# Patient Record
Sex: Female | Born: 1965
Health system: Southern US, Community
[De-identification: ages and names within clinical notes are randomized; demographics above are authoritative.]

## PROBLEM LIST (undated history)

## (undated) DIAGNOSIS — Z803 Family history of malignant neoplasm of breast: Secondary | ICD-10-CM

## (undated) DIAGNOSIS — J45909 Unspecified asthma, uncomplicated: Secondary | ICD-10-CM

## (undated) DIAGNOSIS — C50919 Malignant neoplasm of unspecified site of unspecified female breast: Secondary | ICD-10-CM

## (undated) DIAGNOSIS — I10 Essential (primary) hypertension: Secondary | ICD-10-CM

## (undated) DIAGNOSIS — C801 Malignant (primary) neoplasm, unspecified: Secondary | ICD-10-CM

## (undated) DIAGNOSIS — E876 Hypokalemia: Secondary | ICD-10-CM

## (undated) DIAGNOSIS — Z801 Family history of malignant neoplasm of trachea, bronchus and lung: Secondary | ICD-10-CM

## (undated) DIAGNOSIS — F32A Depression, unspecified: Secondary | ICD-10-CM

## (undated) DIAGNOSIS — J302 Other seasonal allergic rhinitis: Secondary | ICD-10-CM

## (undated) DIAGNOSIS — G5600 Carpal tunnel syndrome, unspecified upper limb: Secondary | ICD-10-CM

## (undated) HISTORY — DX: Carpal tunnel syndrome, unspecified upper limb: G56.00

## (undated) HISTORY — PX: TUBAL LIGATION: SHX77

## (undated) HISTORY — PX: DILATION AND CURETTAGE OF UTERUS: SHX78

## (undated) HISTORY — PX: ABDOMINAL HYSTERECTOMY: SHX81

## (undated) HISTORY — DX: Family history of malignant neoplasm of breast: Z80.3

## (undated) HISTORY — PX: OTHER SURGICAL HISTORY: SHX169

## (undated) HISTORY — DX: Family history of malignant neoplasm of trachea, bronchus and lung: Z80.1

---

## 2001-04-06 ENCOUNTER — Ambulatory Visit (HOSPITAL_COMMUNITY): Admission: RE | Admit: 2001-04-06 | Discharge: 2001-04-06 | Payer: Self-pay | Admitting: Family Medicine

## 2001-04-06 ENCOUNTER — Encounter: Payer: Self-pay | Admitting: Family Medicine

## 2009-11-13 ENCOUNTER — Ambulatory Visit (HOSPITAL_COMMUNITY): Admission: RE | Admit: 2009-11-13 | Discharge: 2009-11-13 | Payer: Self-pay | Admitting: Family Medicine

## 2010-11-08 ENCOUNTER — Ambulatory Visit (HOSPITAL_COMMUNITY): Admission: RE | Admit: 2010-11-08 | Discharge: 2010-11-08 | Payer: Self-pay | Admitting: Family Medicine

## 2010-11-28 ENCOUNTER — Ambulatory Visit (HOSPITAL_COMMUNITY): Admission: RE | Admit: 2010-11-28 | Discharge: 2010-11-28 | Payer: Self-pay | Admitting: Family Medicine

## 2010-12-30 HISTORY — PX: DILATION AND CURETTAGE OF UTERUS: SHX78

## 2011-01-16 LAB — BASIC METABOLIC PANEL
BUN: 7 mg/dL (ref 6–23)
CO2: 28 mEq/L (ref 19–32)
Calcium: 10 mg/dL (ref 8.4–10.5)
Chloride: 102 mEq/L (ref 96–112)
Creatinine, Ser: 1.12 mg/dL (ref 0.4–1.2)
GFR calc Af Amer: >60 mL/min (ref >60)
GFR calc non Af Amer: 53 mL/min — ABNORMAL LOW (ref >60)
Glucose, Bld: 110 mg/dL — ABNORMAL HIGH (ref 70–99)
Potassium: 2.8 mEq/L — ABNORMAL LOW (ref 3.5–5.1)
Sodium: 138 mEq/L (ref 135–145)

## 2011-01-16 LAB — CBC
HCT: 43.8 % (ref 36.0–46.0)
Hemoglobin: 15.2 g/dL — ABNORMAL HIGH (ref 12.0–15.0)
MCH: 28.5 pg (ref 26.0–34.0)
MCHC: 34.7 g/dL (ref 30.0–36.0)
MCV: 82 fL (ref 78.0–100.0)
Platelets: 408 10*3/uL — ABNORMAL HIGH (ref 150–400)
RBC: 5.34 MIL/uL — ABNORMAL HIGH (ref 3.87–5.11)
RDW: 13.8 % (ref 11.5–15.5)
WBC: 12 10*3/uL — ABNORMAL HIGH (ref 4.0–10.5)

## 2011-01-16 LAB — APTT: aPTT: 29 seconds (ref 24–37)

## 2011-01-16 LAB — PROTIME-INR
INR: 1.02 (ref 0.00–1.49)
Prothrombin Time: 13.6 seconds (ref 11.6–15.2)

## 2011-01-17 ENCOUNTER — Ambulatory Visit (HOSPITAL_COMMUNITY)
Admission: RE | Admit: 2011-01-17 | Discharge: 2011-01-17 | Payer: Self-pay | Source: Home / Self Care | Attending: Obstetrics and Gynecology | Admitting: Obstetrics and Gynecology

## 2011-01-19 ENCOUNTER — Encounter: Payer: Self-pay | Admitting: Family Medicine

## 2011-01-21 LAB — POTASSIUM: Potassium: 3.8 meq/L (ref 3.5–5.1)

## 2011-01-21 LAB — PREGNANCY, URINE: Preg Test, Ur: NEGATIVE

## 2011-02-01 NOTE — Op Note (Signed)
Elizabeth Graves, Elizabeth Graves                ACCOUNT NO.:  [id number]  MEDICAL RECORD NO.:  [id number]          PATIENT TYPE:  AMB  LOCATION:  [facility]                           FACILITY:  [facility]  PHYSICIAN:  Miguel Aschoff, M.D.       DATE OF BIRTH:  [birth year]  DATE OF PROCEDURE:  01/17/2011 DATE OF DISCHARGE:                              OPERATIVE REPORT   PREOPERATIVE DIAGNOSES: 1. Irregular vaginal bleeding. 2. Abnormal ultrasound of uterine cavity, revealing thickened     heterogeneous echogenic endometrium, suggestive of hyperplasia or     polyp.  POSTOPERATIVE DIAGNOSES: 1. Irregular vaginal bleeding. 2. Abnormal ultrasound of uterine cavity, revealing thickened     heterogeneous echogenic endometrium, suggestive of hyperplasia or     polyp.  PROCEDURES: 1. Cervical dilatation. 2. Hysteroscopy. 3. Uterine curettage.  SURGEON:  Miguel Aschoff, MD  ANESTHESIA:  General.  COMPLICATIONS:  None.  JUSTIFICATION:  The patient is a 45 year old white female with history of irregular bleeding who had been placed on Megace by another physician, who has been taking high dose of Megace for several months. The patient ended up having an ultrasound done which revealed abnormal findings of the endometrial cavity with the cavity described as thickened heterogeneous and echogenic endometrium, measuring 2 cm in thickness.  I felt this was due to endometrial hyperplasia or large polyp, also the possibility of endometrial carcinoma.  Because of this ultrasound finding, she is being taken to the operating room at this time to undergo hysteroscopy and D and C to ensure that a neoplastic process does not exist.  Risks and benefits of procedure were discussed with the patient.  DESCRIPTION OF PROCEDURE:  The patient was taken to the operating room, placed in supine position.  General anesthesia was administered without difficulty.  She was then placed in the dorsal lithotomy position, prepped and draped in  the usual sterile fashion.  The bladder was catheterized.  Once this was done, examination under anesthesia was carried out.  This revealed normal external genitalia, normal Bartholin and Skene glands, normal urethra.  The vaginal vault was without gross lesion.  The cervix was without gross lesion.  Uterus was noted to be anterior, normal size and shape.  No adnexal masses were noted. Speculum was then placed in the vaginal vault.  Anterior cervical lip was grasped with a tenaculum and then the endocervical canal was sounded with uterine length of 9.5 cm.  Then using serial Pratt dilators, the cervix was further dilated until a #25 Pratt dilator could be passed. The cavity sounded 9.5 cm.  Once this was done, it was further dilated using serial Pratt dilators until a #25 Pratt dilator could be passed. Once this was done, the diagnostic hysteroscope was advanced through the endocervical canal.  No endocervical lesions were noted.  The endometrial cavity was then visualized.  There were no definite polyps noted but a moderate amount of tissue was noted that appeared to be peeling off the endometrial surface and hanging into the endometrial cavity.  The exact nature of these endometrial fragments was unclear. However, no polyps or submucous myomas were noted.  At this point, the hysteroscope was removed and vigorous sharp curettage was carried out with this tissue being removed and sent for histologic study.  On completion of the curettage, hemostasis was readily achieved.  The cervix was then injected with a total of 10 mL of 1% Xylocaine for postop analgesia.  The estimated blood loss from the procedure was less than 20 mL.  The patient tolerated the procedure well.  Plan is for the patient to be discharged home.  Medications for home include Cataflam 50 mg t.i.d. p.r.n. cramping.  She is to call for any problems such as fever, pain, or heavy bleeding.  She is instructed to call for  pathology report in 1 week.  She will be seen back in the office in 3-4 weeks for postop examination.  The patient was sent home in satisfactory condition.     Miguel Aschoff, M.D.     AR/MEDQ  D:  01/17/2011  T:  01/18/2011  Job:  [id number]  Electronically Signed by Miguel Aschoff M.D. on 02/01/2011 06:44:37 AM

## 2012-03-11 ENCOUNTER — Other Ambulatory Visit: Payer: Self-pay | Admitting: Obstetrics and Gynecology

## 2012-05-26 ENCOUNTER — Encounter (HOSPITAL_COMMUNITY): Payer: Self-pay

## 2012-06-02 ENCOUNTER — Other Ambulatory Visit: Payer: Self-pay | Admitting: Obstetrics and Gynecology

## 2012-06-03 ENCOUNTER — Encounter (HOSPITAL_COMMUNITY): Payer: Self-pay

## 2012-06-03 ENCOUNTER — Encounter (HOSPITAL_COMMUNITY)
Admission: RE | Admit: 2012-06-03 | Discharge: 2012-06-03 | Disposition: A | Discharge status: Home / Self Care | Payer: Self-pay | Source: Ambulatory Visit | Attending: Obstetrics and Gynecology | Admitting: Obstetrics and Gynecology

## 2012-06-03 HISTORY — DX: Other seasonal allergic rhinitis: J30.2

## 2012-06-03 HISTORY — DX: Essential (primary) hypertension: I10

## 2012-06-03 HISTORY — DX: Malignant (primary) neoplasm, unspecified: C80.1

## 2012-06-03 HISTORY — DX: Hypokalemia: E87.6

## 2012-06-03 LAB — BASIC METABOLIC PANEL
BUN: 8 mg/dL (ref 6–23)
Chloride: 102 mEq/L (ref 96–112)
Creatinine, Ser: 1.01 mg/dL (ref 0.50–1.10)
GFR calc Af Amer: 78 mL/min — ABNORMAL LOW (ref >90)
Glucose, Bld: 107 mg/dL — ABNORMAL HIGH (ref 70–99)

## 2012-06-03 LAB — CBC
HCT: 42.5 % (ref 36.0–46.0)
Hemoglobin: 14.7 g/dL (ref 12.0–15.0)
MCH: 29.1 pg (ref 26.0–34.0)
MCHC: 34.6 g/dL (ref 30.0–36.0)
RDW: 14.6 % (ref 11.5–15.5)

## 2012-06-03 LAB — SURGICAL PCR SCREEN: Staphylococcus aureus: NEGATIVE

## 2012-06-03 NOTE — Patient Instructions (Addendum)
   Your procedure is scheduled on: Tuesday June 11th  Enter through the [employer] [facility] at: [employer] up the phone at the desk and dial [phone] and inform us of your arrival.  Please call this number if you have any problems the morning of surgery: [phone]  Remember: Do not eat food after midnight: Monday Do not drink clear liquids after: Midnight Monday Take these medicines the morning of surgery with a SIP OF WATER: hydrochlorothiazide   Do not wear jewelry, make-up, or FINGER nail polish Do not wear lotions, powders, perfumes or deodorant. Do not shave 48 hours prior to surgery. Do not bring valuables to the hospital. Contacts, dentures or bridgework may not be worn into surgery.  Leave suitcase in the car. After Surgery it may be brought to your room. For patients being admitted to the hospital, checkout time is 11:00am the day of discharge. Home with mother Deanne Coffer.  Patients discharged on the day of surgery will not be allowed to drive home.     Remember to use your hibiclens as instructed.Please shower with 1/2 bottle the evening before your surgery and the other 1/2 bottle the morning of surgery. Neck down avoiding private area.

## 2012-06-08 NOTE — H&P (Signed)
Elizabeth Graves is an 46 y.o. female. G6 [id number] with a history of heavy irregular vaginal bleeding and pelvic pain. She was treated with Provera in an effort to regulate her cycles but this failed to correct the heavy and irregular bleeding. She has now reached the point at which she wants definitive treatment and is being admitted for a hysterectomy and bilateral salpingo oophorectomy. She understands that if after an exam under anesthesia reveals that this can be done laparoscopically that would be the primary route of choice but also that an abdominal hysterectomy may need to be performed.  Pertinent Gynecological History: Menses: irregular occurring approximately every 15 days with spotting approximately 10 days per month Bleeding: dysfunctional uterine bleeding Contraception: tubal ligation DES exposure: denies Blood transfusions: none Sexually transmitted diseases: no past history Previous GYN Procedures: DNC  Last pap: normal Date:03/11/2012 OB History: G6, P3825   Past Medical History  Diagnosis Date  . SVD (spontaneous vaginal delivery)     x 2  . Cancer     cervical - 20 yrs ago  . Hypertension   . Seasonal allergies     tx with benadryl prn  . Hypokalemia     tx w/ k-Dur    Past Surgical History  Procedure Date  . Tubal ligation   . Dilation and curettage of uterus 12/2010    polyp removed  . Dilation and curettage of uterus     x 3 for MAB  . Co2 laser of cervix     for cervical cancer 20 yrs ago    No family history on file.  Social History:  reports that she has been smoking Cigarettes.  She has a 16 pack-year smoking history. She has never used smokeless tobacco. She reports that she does not drink alcohol or use illicit drugs.  Allergies: No Known Allergies  No prescriptions prior to admission    ROS Respiratory: no cough or shortness of breath GI: no nausea, vomiting, constipation, or diarrhea GU: no dysuria, frequency, or urgency Gyn: see HPI, there is  no discharge or pruritis  There were no vitals taken for this visit. Physical Exam  Afebrile  BP 122/78   Pulse 84  Rerspirations 16 Head: Normocephalic and atraumatic Neck: Supple no JVD no adenopathy no enlargement of the thyroid Breasts: no tender and without masses Heart: regular rhythm no murmur or gallop Abdomen: Soft without enlargement of the liver kidneys or spleen Pelvic Exam:                        External genitalia: within normal limits                      BUS: normal                      Vagina: without lesion or significant discharge                       Cervix: without lesion                      Uterus: globular 10 week equivalent size non tender                       Adnexa without masses  Skin: without lesions Neurologic exam: Oriented times three. Grossly intact No results found for this or any previous visit (from the past  24 hour(s)).  No results found.  Assessment/Plan: Menometrorrhagia not responding to medical therapy  Plan: LAVH BSO possible abdominal hysterectomy BSO  Risks of infection, transfusion, and injury to adjacent structures was discuss and informed consent has been obtained.  Elizabeth Graves 06/08/2012, 5:09 PM

## 2012-06-09 ENCOUNTER — Encounter (HOSPITAL_COMMUNITY): Payer: Self-pay

## 2012-06-09 ENCOUNTER — Encounter (HOSPITAL_COMMUNITY)
Admission: RE | Disposition: A | Discharge status: Home / Self Care | Payer: Self-pay | Source: Ambulatory Visit | Attending: Obstetrics and Gynecology

## 2012-06-09 ENCOUNTER — Ambulatory Visit (HOSPITAL_COMMUNITY): Payer: Self-pay

## 2012-06-09 ENCOUNTER — Ambulatory Visit (HOSPITAL_COMMUNITY)
Admission: RE | Admit: 2012-06-09 | Discharge: 2012-06-10 | Disposition: A | Discharge status: Home / Self Care | Payer: Self-pay | Source: Ambulatory Visit | Attending: Obstetrics and Gynecology | Admitting: Obstetrics and Gynecology

## 2012-06-09 DIAGNOSIS — N920 Excessive and frequent menstruation with regular cycle: Secondary | ICD-10-CM | POA: Insufficient documentation

## 2012-06-09 DIAGNOSIS — N8300 Follicular cyst of ovary, unspecified side: Secondary | ICD-10-CM | POA: Insufficient documentation

## 2012-06-09 DIAGNOSIS — D251 Intramural leiomyoma of uterus: Secondary | ICD-10-CM | POA: Insufficient documentation

## 2012-06-09 DIAGNOSIS — Z01812 Encounter for preprocedural laboratory examination: Secondary | ICD-10-CM | POA: Insufficient documentation

## 2012-06-09 DIAGNOSIS — Z01818 Encounter for other preprocedural examination: Secondary | ICD-10-CM | POA: Insufficient documentation

## 2012-06-09 DIAGNOSIS — N949 Unspecified condition associated with female genital organs and menstrual cycle: Secondary | ICD-10-CM | POA: Insufficient documentation

## 2012-06-09 HISTORY — PX: SALPINGOOPHORECTOMY: SHX82

## 2012-06-09 HISTORY — PX: LAPAROSCOPIC ASSISTED VAGINAL HYSTERECTOMY: SHX5398

## 2012-06-09 LAB — HEMOGLOBIN AND HEMATOCRIT, BLOOD
HCT: 39.3 % (ref 36.0–46.0)
Hemoglobin: 13.4 g/dL (ref 12.0–15.0)

## 2012-06-09 LAB — APTT: aPTT: 28 seconds (ref 24–37)

## 2012-06-09 SURGERY — HYSTERECTOMY, VAGINAL, LAPAROSCOPY-ASSISTED
Anesthesia: General | Site: Abdomen | Wound class: Clean Contaminated

## 2012-06-09 MED ORDER — BUPIVACAINE HCL (PF) 0.25 % IJ SOLN
INTRAMUSCULAR | Status: AC
  Filled 2012-06-09: qty 30

## 2012-06-09 MED ORDER — DIPHENHYDRAMINE HCL 12.5 MG/5ML PO ELIX: 12.5000 mg | ORAL_SOLUTION | Freq: Four times a day (QID) | ORAL | Status: DC | PRN

## 2012-06-09 MED ORDER — LACTATED RINGERS IV SOLN
INTRAVENOUS | Status: DC
  Administered 2012-06-09 (×2): via INTRAVENOUS
  Administered 2012-06-09: 50 mL/h via INTRAVENOUS

## 2012-06-09 MED ORDER — ROCURONIUM BROMIDE 100 MG/10ML IV SOLN
INTRAVENOUS | Status: DC | PRN
  Administered 2012-06-09: 10 mg via INTRAVENOUS
  Administered 2012-06-09: 40 mg via INTRAVENOUS

## 2012-06-09 MED ORDER — PROPOFOL 10 MG/ML IV EMUL
INTRAVENOUS | Status: AC
  Filled 2012-06-09: qty 20

## 2012-06-09 MED ORDER — ONDANSETRON HCL 4 MG/2ML IJ SOLN: 4.0000 mg | Freq: Four times a day (QID) | INTRAMUSCULAR | Status: DC | PRN

## 2012-06-09 MED ORDER — FENTANYL CITRATE 0.05 MG/ML IJ SOLN
INTRAMUSCULAR | Status: AC
  Filled 2012-06-09: qty 5

## 2012-06-09 MED ORDER — FENTANYL CITRATE 0.05 MG/ML IJ SOLN
INTRAMUSCULAR | Status: DC | PRN
  Administered 2012-06-09 (×2): 100 ug via INTRAVENOUS
  Administered 2012-06-09 (×3): 50 ug via INTRAVENOUS

## 2012-06-09 MED ORDER — FENTANYL CITRATE 0.05 MG/ML IJ SOLN
INTRAMUSCULAR | Status: AC
  Filled 2012-06-09: qty 2

## 2012-06-09 MED ORDER — OXYCODONE-ACETAMINOPHEN 5-325 MG PO TABS
1.0000 | ORAL_TABLET | ORAL | Status: DC | PRN
  Administered 2012-06-10: 1 via ORAL
  Filled 2012-06-09: qty 1

## 2012-06-09 MED ORDER — NALOXONE HCL 0.4 MG/ML IJ SOLN: 0.4000 mg | INTRAMUSCULAR | Status: DC | PRN

## 2012-06-09 MED ORDER — LIDOCAINE-EPINEPHRINE 1 %-1:100000 IJ SOLN
INTRAMUSCULAR | Status: DC | PRN
  Administered 2012-06-09: 30 mL

## 2012-06-09 MED ORDER — SODIUM CHLORIDE 0.9 % IJ SOLN: 9.0000 mL | INTRAMUSCULAR | Status: DC | PRN

## 2012-06-09 MED ORDER — DIPHENHYDRAMINE HCL 50 MG/ML IJ SOLN: 12.5000 mg | Freq: Four times a day (QID) | INTRAMUSCULAR | Status: DC | PRN

## 2012-06-09 MED ORDER — BUPIVACAINE HCL (PF) 0.25 % IJ SOLN
INTRAMUSCULAR | Status: DC | PRN
  Administered 2012-06-09: 30 mL

## 2012-06-09 MED ORDER — PROPOFOL 10 MG/ML IV EMUL
INTRAVENOUS | Status: DC | PRN
  Administered 2012-06-09: 100 mg via INTRAVENOUS
  Administered 2012-06-09: 200 mg via INTRAVENOUS

## 2012-06-09 MED ORDER — HYDROMORPHONE HCL PF 1 MG/ML IJ SOLN
INTRAMUSCULAR | Status: AC
  Administered 2012-06-09: 0.5 mg via INTRAVENOUS
  Filled 2012-06-09: qty 1

## 2012-06-09 MED ORDER — HYDROMORPHONE HCL PF 1 MG/ML IJ SOLN
0.2500 mg | INTRAMUSCULAR | Status: DC | PRN
  Administered 2012-06-09 (×4): 0.5 mg via INTRAVENOUS

## 2012-06-09 MED ORDER — LACTATED RINGERS IR SOLN
Status: DC | PRN
  Administered 2012-06-09: 3000 mL

## 2012-06-09 MED ORDER — CEFAZOLIN SODIUM 1-5 GM-% IV SOLN
INTRAVENOUS | Status: AC
  Filled 2012-06-09: qty 50

## 2012-06-09 MED ORDER — GLYCOPYRROLATE 0.2 MG/ML IJ SOLN
INTRAMUSCULAR | Status: AC
  Filled 2012-06-09: qty 1

## 2012-06-09 MED ORDER — MENTHOL 3 MG MT LOZG: 1.0000 | LOZENGE | OROMUCOSAL | Status: DC | PRN

## 2012-06-09 MED ORDER — LACTATED RINGERS IV SOLN
INTRAVENOUS | Status: DC
  Administered 2012-06-09 – 2012-06-10 (×3): via INTRAVENOUS

## 2012-06-09 MED ORDER — NEOSTIGMINE METHYLSULFATE 1 MG/ML IJ SOLN
INTRAMUSCULAR | Status: AC
  Filled 2012-06-09: qty 10

## 2012-06-09 MED ORDER — CEFAZOLIN SODIUM 1-5 GM-% IV SOLN
1.0000 g | INTRAVENOUS | Status: AC
  Administered 2012-06-09: 1 g via INTRAVENOUS

## 2012-06-09 MED ORDER — ONDANSETRON HCL 4 MG/2ML IJ SOLN
INTRAMUSCULAR | Status: DC | PRN
  Administered 2012-06-09: 4 mg via INTRAVENOUS

## 2012-06-09 MED ORDER — NEOSTIGMINE METHYLSULFATE 1 MG/ML IJ SOLN
INTRAMUSCULAR | Status: DC | PRN
  Administered 2012-06-09: 5 mg via INTRAVENOUS

## 2012-06-09 MED ORDER — MIDAZOLAM HCL 5 MG/5ML IJ SOLN
INTRAMUSCULAR | Status: DC | PRN
  Administered 2012-06-09: 2 mg via INTRAVENOUS

## 2012-06-09 MED ORDER — GLYCOPYRROLATE 0.2 MG/ML IJ SOLN
INTRAMUSCULAR | Status: AC
  Filled 2012-06-09: qty 3

## 2012-06-09 MED ORDER — DEXAMETHASONE SODIUM PHOSPHATE 4 MG/ML IJ SOLN
INTRAMUSCULAR | Status: DC | PRN
  Administered 2012-06-09: 10 mg via INTRAVENOUS

## 2012-06-09 MED ORDER — IBUPROFEN 600 MG PO TABS
600.0000 mg | ORAL_TABLET | Freq: Four times a day (QID) | ORAL | Status: DC | PRN
  Administered 2012-06-10: 600 mg via ORAL
  Filled 2012-06-09: qty 1

## 2012-06-09 MED ORDER — GLYCOPYRROLATE 0.2 MG/ML IJ SOLN
INTRAMUSCULAR | Status: DC | PRN
  Administered 2012-06-09: 0.4 mg via INTRAVENOUS
  Administered 2012-06-09: 1 mg via INTRAVENOUS

## 2012-06-09 MED ORDER — DEXAMETHASONE SODIUM PHOSPHATE 10 MG/ML IJ SOLN
INTRAMUSCULAR | Status: AC
  Filled 2012-06-09: qty 1

## 2012-06-09 MED ORDER — LIDOCAINE HCL (CARDIAC) 20 MG/ML IV SOLN
INTRAVENOUS | Status: DC | PRN
  Administered 2012-06-09: 30 mg via INTRAVENOUS

## 2012-06-09 MED ORDER — MIDAZOLAM HCL 2 MG/2ML IJ SOLN
INTRAMUSCULAR | Status: AC
  Filled 2012-06-09: qty 2

## 2012-06-09 MED ORDER — LIDOCAINE HCL (CARDIAC) 20 MG/ML IV SOLN
INTRAVENOUS | Status: AC
  Filled 2012-06-09: qty 5

## 2012-06-09 MED ORDER — MORPHINE SULFATE (PF) 1 MG/ML IV SOLN
INTRAVENOUS | Status: DC
  Administered 2012-06-09: 3 mg via INTRAVENOUS
  Administered 2012-06-09: 9 mg via INTRAVENOUS
  Administered 2012-06-09: 6 mg via INTRAVENOUS
  Administered 2012-06-09: 11:00:00 via INTRAVENOUS
  Administered 2012-06-10: 4.5 mg via INTRAVENOUS
  Administered 2012-06-10: via INTRAVENOUS
  Administered 2012-06-10: 7.5 mg via INTRAVENOUS
  Filled 2012-06-09 (×2): qty 25

## 2012-06-09 MED ORDER — ONDANSETRON HCL 4 MG/2ML IJ SOLN
INTRAMUSCULAR | Status: AC
  Filled 2012-06-09: qty 2

## 2012-06-09 SURGICAL SUPPLY — 42 items
BLADE SURG 15 STRL LF C SS BP (BLADE) ×2 IMPLANT
BLADE SURG 15 STRL SS (BLADE) ×1
CLOTH BEACON ORANGE TIMEOUT ST (SAFETY) ×3 IMPLANT
CONT PATH 16OZ SNAP LID 3702 (MISCELLANEOUS) ×3 IMPLANT
COVER TABLE BACK 60X90 (DRAPES) ×3 IMPLANT
DECANTER SPIKE VIAL GLASS SM (MISCELLANEOUS) IMPLANT
DERMABOND ADVANCED (GAUZE/BANDAGES/DRESSINGS) ×1
DERMABOND ADVANCED .7 DNX12 (GAUZE/BANDAGES/DRESSINGS) ×2 IMPLANT
DRSG COVADERM PLUS 2X2 (GAUZE/BANDAGES/DRESSINGS) ×3 IMPLANT
ELECT REM PT RETURN 9FT ADLT (ELECTROSURGICAL) ×3
ELECTRODE REM PT RTRN 9FT ADLT (ELECTROSURGICAL) ×2 IMPLANT
FORCEPS CUTTING 33CM 5MM (CUTTING FORCEPS) IMPLANT
GAUZE PACKING IODOFORM 2 (PACKING) IMPLANT
GLOVE BIO SURGEON STRL SZ7.5 (GLOVE) ×9 IMPLANT
GLOVE BIOGEL PI IND STRL 6.5 (GLOVE) ×2 IMPLANT
GLOVE BIOGEL PI INDICATOR 6.5 (GLOVE) ×1
GLOVE ECLIPSE 7.5 STRL STRAW (GLOVE) ×6 IMPLANT
GLOVE INDICATOR 7.0 STRL GRN (GLOVE) ×3 IMPLANT
GLOVE SKINSENSE NS SZ7.0 (GLOVE) ×3
GLOVE SKINSENSE STRL SZ7.0 (GLOVE) ×6 IMPLANT
GOWN PREVENTION PLUS LG XLONG (DISPOSABLE) ×9 IMPLANT
GOWN PREVENTION PLUS XXLARGE (GOWN DISPOSABLE) ×3 IMPLANT
NS IRRIG 1000ML POUR BTL (IV SOLUTION) ×3 IMPLANT
PACK LAVH (CUSTOM PROCEDURE TRAY) ×3 IMPLANT
PROTECTOR NERVE ULNAR (MISCELLANEOUS) ×3 IMPLANT
SEALER TISSUE G2 CVD JAW 35 (ENDOMECHANICALS) ×2 IMPLANT
SEALER TISSUE G2 CVD JAW 45CM (ENDOMECHANICALS) ×1
SET IRRIG TUBING LAPAROSCOPIC (IRRIGATION / IRRIGATOR) ×3 IMPLANT
SOLUTION ELECTROLUBE (MISCELLANEOUS) ×3 IMPLANT
STRIP CLOSURE SKIN 1/2X4 (GAUZE/BANDAGES/DRESSINGS) ×3 IMPLANT
STRIP CLOSURE SKIN 1/4X3 (GAUZE/BANDAGES/DRESSINGS) ×3 IMPLANT
SUT CHROMIC 0 CT 1 (SUTURE) IMPLANT
SUT VIC AB 0 CT1 18XCR BRD8 (SUTURE) ×6 IMPLANT
SUT VIC AB 0 CT1 27 (SUTURE) ×2
SUT VIC AB 0 CT1 27XBRD ANBCTR (SUTURE) ×4 IMPLANT
SUT VIC AB 0 CT1 8-18 (SUTURE) ×3
SUT VIC AB 3-0 X1 27 (SUTURE) ×3 IMPLANT
SUT VICRYL 1 TIES 12X18 (SUTURE) ×3 IMPLANT
TOWEL OR 17X24 6PK STRL BLUE (TOWEL DISPOSABLE) ×6 IMPLANT
TRAY FOLEY CATH 14FR (SET/KITS/TRAYS/PACK) ×3 IMPLANT
WARMER LAPAROSCOPE (MISCELLANEOUS) ×3 IMPLANT
WATER STERILE IRR 1000ML POUR (IV SOLUTION) ×3 IMPLANT

## 2012-06-09 NOTE — Anesthesia Preprocedure Evaluation (Signed)
Anesthesia Evaluation    Airway       Dental  (+) Poor Dentition,    Pulmonary          Cardiovascular hypertension, Pt. on medications     Neuro/Psych    GI/Hepatic   Endo/Other  Morbid obesity  Renal/GU      Musculoskeletal   Abdominal   Peds  Hematology   Anesthesia Other Findings   Reproductive/Obstetrics                           Anesthesia Physical Anesthesia Plan  ASA: III  Anesthesia Plan: General   Post-op Pain Management:    Induction: Intravenous  Airway Management Planned: Oral ETT  Additional Equipment:   Intra-op Plan:   Post-operative Plan:   Informed Consent: I have reviewed the patients History and Physical, chart, labs and discussed the procedure including the risks, benefits and alternatives for the proposed anesthesia with the patient or authorized representative who has indicated his/her understanding and acceptance.   Dental Advisory Given and Dental advisory given  Plan Discussed with: CRNA and Surgeon  Anesthesia Plan Comments: (  Discussed  general anesthesia, including possible nausea, instrumentation of airway, sore throat,pulmonary aspiration, etc. I asked if the were any outstanding questions, or  concerns before we proceeded. )        Anesthesia Quick Evaluation

## 2012-06-09 NOTE — Brief Op Note (Signed)
06/09/2012  9:13 AM  PATIENT:  Elizabeth Graves  46 y.o. female  PRE-OPERATIVE DIAGNOSIS:  MENOMETRORRHAGIA  POST-OPERATIVE DIAGNOSIS:  MENOMETRORRHAGIA   PROCEDURE:  Procedure(s) (LRB): LAPAROSCOPIC ASSISTED VAGINAL HYSTERECTOMY (N/A) SALPINGO OOPHERECTOMY (Bilateral)  SURGEON:  Surgeon(s) and Role:    * Miguel Aschoff, MD - Primary    * W Lodema Hong, MD - Assisting   ANESTHESIA:   general  EBL:  Total I/O In: 2000 [I.V.:2000] Out: 175 [Urine:100; Blood:75]  BLOOD ADMINISTERED:none  DRAINS: Urinary Catheter (Foley)   LOCAL MEDICATIONS USED:  MARCAINE     SPECIMEN:  Source of Specimen:  ueterus tubes and ovaries  DISPOSITION OF SPECIMEN:  PATHOLOGY  COUNTS:  YES  TOURNIQUET:  * No tourniquets in log *  DICTATION: .Other Dictation: Dictation Number [id number]  PLAN OF CARE: Admit for overnight observation  PATIENT DISPOSITION:  PACU - hemodynamically stable.   Delay start of Pharmacological VTE agent (>24hrs) due to surgical blood loss or risk of bleeding: PAS hose placed

## 2012-06-09 NOTE — Anesthesia Postprocedure Evaluation (Signed)
  Anesthesia Post-op Note  Patient: Elizabeth Graves  Procedure(s) Performed: Procedure(s) (LRB): LAPAROSCOPIC ASSISTED VAGINAL HYSTERECTOMY (N/A) SALPINGO OOPHERECTOMY (Bilateral) Patient is awake and responsive. Pain and nausea are reasonably well controlled. Vital signs are stable and clinically acceptable. Oxygen saturation is clinically acceptable. There are no apparent anesthetic complications at this time. Patient is ready for discharge.

## 2012-06-09 NOTE — Anesthesia Procedure Notes (Signed)
Procedure Name: Intubation Date/Time: 06/09/2012 7:39 AM Performed by: Lincoln Brigham Pre-anesthesia Checklist: Patient identified, Timeout performed, Emergency Drugs available, Suction available and Patient being monitored Patient Re-evaluated:Patient Re-evaluated prior to inductionOxygen Delivery Method: Circle system utilized Preoxygenation: Pre-oxygenation with 100% oxygen Intubation Type: IV induction Ventilation: Mask ventilation without difficulty Laryngoscope Size: Miller and 3 Grade View: Grade II Tube type: Oral Tube size: 7.0 mm Number of attempts: 1 Placement Confirmation: ETT inserted through vocal cords under direct vision,  breath sounds checked- equal and bilateral and positive ETCO2 Secured at: 22 cm Tube secured with: Tape Dental Injury: Teeth and Oropharynx as per pre-operative assessment

## 2012-06-09 NOTE — Op Note (Signed)
Elizabeth Graves, LISCANO NO.:  [id number]  MEDICAL RECORD NO.:  [id number]  LOCATION:  [facility]                          FACILITY:  [facility]  PHYSICIAN:  Miguel Aschoff, M.D.       DATE OF BIRTH:  1966/01/31  DATE OF PROCEDURE:  06/09/2012 DATE OF DISCHARGE:                              OPERATIVE REPORT   PREOPERATIVE DIAGNOSES:  Menometrorrhagia, pelvic pain.  POSTOPERATIVE DIAGNOSES:  Menometrorrhagia, pelvic pain.  PROCEDURE:  Laparoscopically assisted vaginal hysterectomy and bilateral salpingo-oophorectomy.  SURGEON:  Miguel Aschoff, MD  ASSISTANT:  Luvenia Redden, MD  ANESTHESIA:  General.  COMPLICATIONS:  None.  JUSTIFICATION:  The patient is a 46 year old white female with history of irregular vaginal bleeding that has not responded to medical therapy. The patient has been treated in the past with D and C and in view of the persistent bleeding, unresponsive to gestational therapy.  The patient has requested definitive procedure to be carried out to eliminate the bleeding.  In addition, there is a family history of cervical and ovarian cancer and the patient has requested at time of surgery that her ovaries be removed.  Informed consent has been obtained.  PROCEDURE:  The patient was taken to the operating room, placed in supine position.  General anesthesia was administered without difficulty.  She was then placed in dorsal lithotomy position.  Prepped and draped in usual sterile fashion.  Foley catheter was inserted. Examination under anesthesia revealed normal external genitalia.  Normal Bartholin's, Skene's glands.  Normal urethra.  Vaginal vault was without gross lesion.  The cervix was without gross lesion.  The uterus was noted to be globular, top-normal size, retroflexed, no adnexal masses were noted.  At this point, speculum was placed in the vaginal vault. Anterior cervical lip was grasped with a tenaculum and then a Hulka tenaculum was placed in the  cervix and held.  Attention was then directed to the umbilicus where a small infraumbilical incision was made.  A Veress needle was inserted and the abdomen was insufflated with 3 L of CO2.  Following the insufflation, the trocar to laparoscope was placed followed by laparoscope itself.  Then under direct visualization, 2 accessory 5 mm ports were established in the right and left lower quadrants.  At this point, inspection revealed the uterus to be retroflexed, again globular, top normal size.  The tubes and ovaries appeared to be within normal limits except for evidence of a prior tubal sterilization.  The upper abdomen was unremarkable.  The appendix was visualized and was noted to be within normal limits.  There were no other abnormalities noted in the abdomen.  At this point, the left infundibulopelvic ligament was identified as was the ureter and then using an Enseal device, the infundibulopelvic ligament was cauterized and cut.  Dissection continued along the mesovarian ligament and mesosalpinx with the Enseal device using serial cauterizations and cuts. The round ligaments were then divided similarly.  Uterine vessels were found cauterized and cut, and then attention was directed to the right side where the identical procedure was carried out with the Enseal unit and the dissection continuing along the mesosalpinx, mesovarian ligament, round ligament, broad ligament  until the uterine vessels were reached.  At this point, attention was directed to the vaginal portion of the procedure.  The patient was placed in the dorsal lithotomy position again.  Speculum placed in the vaginal vault.  Cervix grasped, injected with 5 mL of 1% Xylocaine With Epinephrine.  Then the cervical mucosa was circumscribed and dissected anteriorly and posteriorly until the peritoneal reflections were found and entered.  At this point, the uterosacral ligaments were identified, clamped with Heaney clamps,  cut, and the pedicles suture ligated using suture ligatures of 0 Vicryl.  The cardinal ligaments were then clamped, cut, and suture ligated in similar fashion.  The residual portion of the paracervical tissues were then clamped with curved Heaney clamps, cut, and suture ligated.  This continued until the level of the uterine vessels.  Again all pedicles being clamped, cut, and suture ligated using suture ligatures of 0 Vicryl.  At this point, it is possible to deliver the fundus through the cul-de-sac and small amount of residual tissue holding the uterus in place was clamped with Heaney clamps.  These pedicles cut and the specimen freed.  The specimen consisting of the cervix, uterus, tubes, and ovaries.  These pedicles were then ligated using ligatures of 0 Vicryl.  At this point, the posterior vaginal cuff was run using running interlocking 0 Vicryl suture.  The peritoneum was then closed using pursestring suture of 0 Vicryl.  Then the vaginal cuff was closed using running interlocking 0 Vicryl suture.  At this point, the patient was placed back in supine position.  The arm was reinsufflated and inspection was carried out via the laparoscope to ensure that there was excellent hemostasis.  The pelvis irrigated via Nezhat unit.  There appeared to be excellent hemostasis and at this point, the CO2 was allowed to escape.  The small incisions were then closed using 4-0 Vicryl.  The estimated blood loss from the procedure was approximately 100 mL.  The patient's urine was clear at the end of the procedure.  The patient was taken to the recovery room.  She will be observed overnight. She was sent to recovery room in satisfactory condition.     Miguel Aschoff, M.D.     AR/MEDQ  D:  06/09/2012  T:  06/09/2012  Job:  [id number]

## 2012-06-09 NOTE — Transfer of Care (Signed)
Immediate Anesthesia Transfer of Care Note  Patient: Elizabeth Graves  Procedure(s) Performed: Procedure(s) (LRB): LAPAROSCOPIC ASSISTED VAGINAL HYSTERECTOMY (N/A) SALPINGO OOPHERECTOMY (Bilateral)  Patient Location: PACU  Anesthesia Type: General  Level of Consciousness: awake, alert  and oriented  Airway & Oxygen Therapy: Patient Spontanous Breathing and Patient connected to nasal cannula oxygen  Post-op Assessment: Report given to PACU RN and Post -op Vital signs reviewed and stable  Post vital signs: Reviewed and stable  Complications: No apparent anesthesia complications

## 2012-06-10 ENCOUNTER — Encounter (HOSPITAL_COMMUNITY): Payer: Self-pay | Admitting: Obstetrics and Gynecology

## 2012-06-10 LAB — CBC
MCH: 28.4 pg (ref 26.0–34.0)
MCHC: 33.1 g/dL (ref 30.0–36.0)
Platelets: 292 10*3/uL (ref 150–400)
RBC: 4.09 MIL/uL (ref 3.87–5.11)

## 2012-06-10 MED ORDER — PNEUMOCOCCAL VAC POLYVALENT 25 MCG/0.5ML IJ INJ
0.5000 mL | INJECTION | INTRAMUSCULAR | Status: AC
  Administered 2012-06-10: 0.5 mL via INTRAMUSCULAR
  Filled 2012-06-10: qty 0.5

## 2012-06-10 NOTE — Discharge Instructions (Signed)
Call for any problems such as fever severe pain or heavy bleeding  You can shower baths and swimming OK in 2 weeks  Set up follow up visit for 4 weeks

## 2012-06-10 NOTE — Progress Notes (Signed)
S: Doing well this moring good pain control patient has not voided yet.Has been stable throughout the night  O: Afebrile   BP100/64  Pulse78  Respiration 15  Abdomen is soft wounds are heaing well  Lab: hgb  11.6  A: Stable s/p LAVH BSO   Plan: Discharge home           Resume meds           Meds for home   Tylox one every 3 to 4 hours as needed                                        Estradiol  One daily   Return to office in 4 weeks Regular diet Condition improved Path pending

## 2012-06-10 NOTE — Anesthesia Postprocedure Evaluation (Signed)
  Anesthesia Post-op Note  Patient: Elizabeth Graves  Procedure(s) Performed: Procedure(s) (LRB): LAPAROSCOPIC ASSISTED VAGINAL HYSTERECTOMY (N/A) SALPINGO OOPHERECTOMY (Bilateral)  Patient Location: [facility]  Anesthesia Type: General  Level of Consciousness: awake  Airway and Oxygen Therapy: Patient Spontanous Breathing  Post-op Pain: none  Post-op Assessment: Patient's Cardiovascular Status Stable and Respiratory Function Stable  Post-op Vital Signs: Reviewed and stable  Complications: No apparent anesthesia complications

## 2012-06-10 NOTE — Addendum Note (Signed)
Addendum  created 06/10/12 1013 by Suella Grove, CRNA   Modules edited:Notes Section

## 2012-06-15 NOTE — Discharge Summary (Signed)
Elizabeth Graves, Elizabeth Graves NO.:  [id number]  MEDICAL RECORD NO.:  [id number]  LOCATION:  9305                          FACILITY:  [facility]  PHYSICIAN:  Miguel Aschoff, M.D.       DATE OF BIRTH:  06/22/1966  DATE OF ADMISSION:  06/09/2012 DATE OF DISCHARGE:  06/10/2012                              DISCHARGE SUMMARY   ADMISSION DIAGNOSES:  Menometrorrhagia and pelvic pain.  FINAL DIAGNOSES:  Menometrorrhagia and pelvic pain.  OPERATIONS AND PROCEDURES:  Laparoscopically-assisted vaginal hysterectomy and bilateral salpingo-oophorectomy, general anesthesia.  BRIEF HISTORY:  The patient is a 46 year old, white female with history of irregular bleeding, treated with medical therapy and also treated in the past with a dilatation and curettage.  Her menometrorrhagia did not respond to either these methods of birth control and because of persistent bleeding and the pain associated with this and her desire for definitive treatment, she is being admitted to the hospital this time. She was admitted to the hospital at this time to undergo a laparoscopically-assisted vaginal hysterectomy and bilateral salpingo- oophorectomy.  The bilateral salpingo-oophorectomy was requested by the patient because of the strong history of gynecologic cancers.  HOSPITAL COURSE:  Preoperative studies were obtained.  This included a chemistry profile, which was essentially within normal limits except for a slight elevation of glucose at 107 and a potassium at 3.4.  PT and PTT within normal limits.  Urinalysis was negative.  MSR screen and strep screens were also negative.  HOSPITAL COURSE:  Under general anesthesia, a laparoscopically-assisted vaginal hysterectomy and bilateral salpingo-oophorectomy were carried out without difficulty.  The patient's postoperative course was essentially uncomplicated.  She tolerated increasing ambulation and diet well and by the morning of the first postoperative day,  was felt to be in satisfactory enough condition to be discharged home.  Her postop hemoglobin was 11.7.  The patient was sent home on Vicodin 1 or 2 every 3 hours as needed for pain.  Estradiol 1 mg p.o. daily.  She was instructed to place nothing in the vagina to call if there are any problems such as fever, pain or heavy bleeding.  She will be seen back in 4 weeks for followup examination.  The pathology report on the hysterectomy specimen revealed the uterus weighing 183 g excluding the adnexa.  The microscopic pathology showed chronic cervicitis, decidualized endometrium, leiomyomata, signs of prior tubal sterilization.  The ovaries were essentially unremarkable.     Miguel Aschoff, M.D.    AR/MEDQ  D:  06/15/2012  T:  06/15/2012  Job:  [id number]

## 2013-03-18 ENCOUNTER — Encounter: Payer: Self-pay | Admitting: Family Medicine

## 2013-03-18 DIAGNOSIS — J45909 Unspecified asthma, uncomplicated: Secondary | ICD-10-CM | POA: Insufficient documentation

## 2013-03-18 DIAGNOSIS — J309 Allergic rhinitis, unspecified: Secondary | ICD-10-CM

## 2013-03-18 DIAGNOSIS — C539 Malignant neoplasm of cervix uteri, unspecified: Secondary | ICD-10-CM | POA: Insufficient documentation

## 2013-03-19 ENCOUNTER — Ambulatory Visit: Payer: Self-pay | Admitting: Nurse Practitioner

## 2013-08-17 ENCOUNTER — Other Ambulatory Visit: Payer: Self-pay | Admitting: *Deleted

## 2013-08-17 ENCOUNTER — Telehealth: Payer: Self-pay | Admitting: Family Medicine

## 2013-08-17 MED ORDER — ALPRAZOLAM 0.25 MG PO TABS: 0.2500 mg | ORAL_TABLET | Freq: Two times a day (BID) | ORAL | Status: DC

## 2013-08-17 NOTE — Telephone Encounter (Signed)
Pt was prescribed xanax 0.25 back in January. She is requesting a refill. She only takes when needed. Xanax 0.25mg  #20 one bid prn called into pharm per Dr. Lorin Picket.

## 2013-08-17 NOTE — Telephone Encounter (Signed)
Patient needs Rx for xanax    [facility] in [place]

## 2013-08-17 NOTE — Telephone Encounter (Signed)
ntc- find out whats up,need,etc

## 2013-09-30 ENCOUNTER — Ambulatory Visit: Payer: Self-pay | Admitting: Family Medicine

## 2014-04-24 ENCOUNTER — Emergency Department (HOSPITAL_COMMUNITY)
Admission: EM | Admit: 2014-04-24 | Discharge: 2014-04-25 | Disposition: A | Discharge status: Home / Self Care | Payer: Self-pay | Attending: Emergency Medicine | Admitting: Emergency Medicine

## 2014-04-24 ENCOUNTER — Encounter (HOSPITAL_COMMUNITY): Payer: Self-pay | Admitting: Emergency Medicine

## 2014-04-24 ENCOUNTER — Emergency Department (HOSPITAL_COMMUNITY): Payer: Self-pay

## 2014-04-24 DIAGNOSIS — R079 Chest pain, unspecified: Secondary | ICD-10-CM | POA: Insufficient documentation

## 2014-04-24 DIAGNOSIS — I10 Essential (primary) hypertension: Secondary | ICD-10-CM | POA: Insufficient documentation

## 2014-04-24 DIAGNOSIS — Z862 Personal history of diseases of the blood and blood-forming organs and certain disorders involving the immune mechanism: Secondary | ICD-10-CM | POA: Insufficient documentation

## 2014-04-24 DIAGNOSIS — F17200 Nicotine dependence, unspecified, uncomplicated: Secondary | ICD-10-CM | POA: Insufficient documentation

## 2014-04-24 DIAGNOSIS — R05 Cough: Secondary | ICD-10-CM | POA: Insufficient documentation

## 2014-04-24 DIAGNOSIS — Z8639 Personal history of other endocrine, nutritional and metabolic disease: Secondary | ICD-10-CM | POA: Insufficient documentation

## 2014-04-24 DIAGNOSIS — R0602 Shortness of breath: Secondary | ICD-10-CM | POA: Insufficient documentation

## 2014-04-24 DIAGNOSIS — R059 Cough, unspecified: Secondary | ICD-10-CM | POA: Insufficient documentation

## 2014-04-24 DIAGNOSIS — Z8541 Personal history of malignant neoplasm of cervix uteri: Secondary | ICD-10-CM | POA: Insufficient documentation

## 2014-04-24 NOTE — ED Notes (Signed)
Pt reports taking Pepto and Ibuprofen which have helped ease the pain.

## 2014-04-24 NOTE — ED Provider Notes (Signed)
CSN: [id number]     Arrival date & time 04/24/14  2227 History  This chart was scribed for Elizabeth Acosta, MD by Elizabeth Graves, ED Scribe. This patient was seen in room APA11/APA11 and the patient's care was started at 11:20 PM.    Chief Complaint  Patient presents with  . Chest Pain   The history is provided by the patient. No language interpreter was used.   HPI Comments: Elizabeth Graves is a 48 y.o. female who presents to the Emergency Department complaining of gradual onset, "sharp and burning" center chest pain onset at 9 PM tonight that occurred while she was watching tv. She states that the pain resolved in 2 hours by resting.  She is now CP free. Pt states she had similar symptoms 1 month ago. The pt states she has associated SOB, and coughing over the past week, which sometimes increases the chest pain tonight. She states that deep breathing has exacerbated this chest pain as well. She states to she took Pepto Bismol and 3 ibuprofen tonight without relief. She denies having any cardiac conditions, DM or high cholesterol. She states that she has a h/o of HTN but has stopped taking her medication for the past year. She is a 1ppd smoker and lives a sedentary lifestyle. She denies using cocaine or other illicit drugs.   Past Medical History  Diagnosis Date  . SVD (spontaneous vaginal delivery)     x 2  . Cancer     cervical - 20 yrs ago  . Hypertension   . Seasonal allergies     tx with benadryl prn  . Hypokalemia     tx w/ k-Dur   Past Surgical History  Procedure Laterality Date  . Tubal ligation    . Dilation and curettage of uterus  12/2010    polyp removed  . Dilation and curettage of uterus      x 3 for MAB  . Co2 laser of cervix      for cervical cancer 20 yrs ago  . Laparoscopic assisted vaginal hysterectomy  06/09/2012    Procedure: LAPAROSCOPIC ASSISTED VAGINAL HYSTERECTOMY;  Surgeon: Gus Height, MD;  Location: [facility];  Service: Gynecology;  Laterality: N/A;  .  Salpingoophorectomy  06/09/2012    Procedure: SALPINGO OOPHERECTOMY;  Surgeon: Gus Height, MD;  Location: [facility];  Service: Gynecology;  Laterality: Bilateral;  . Abdominal hysterectomy     No family history on file. History  Substance Use Topics  . Smoking status: Current Every Day Smoker -- 1.00 packs/day for 16 years    Types: Cigarettes  . Smokeless tobacco: Never Used  . Alcohol Use: No   OB History   Grav Para Term Preterm Abortions TAB SAB Ect Mult Living                 Review of Systems  Respiratory: Positive for cough and shortness of breath.   Cardiovascular: Positive for chest pain.  All other systems reviewed and are negative.   Allergies  Review of patient's allergies indicates no known allergies.  Home Medications   Prior to Admission medications   Medication Sig Start Date End Date Taking? Authorizing Provider  diphenhydrAMINE (BENADRYL) 25 MG tablet Take 25 mg by mouth every 6 (six) hours as needed for allergies.   Yes Historical Provider, MD  ibuprofen (ADVIL,MOTRIN) 200 MG tablet Take 600 mg by mouth every 6 (six) hours as needed.   Yes Historical Provider, MD   Triage Vitals: BP  172/103  Pulse 88  Temp(Src) 97.9 F (36.6 C) (Oral)  Resp 20  Ht 5' (1.524 m)  Wt 163 lb 9.6 oz (74.208 kg)  BMI 31.95 kg/m2  SpO2 98%  Physical Exam  Nursing note and vitals reviewed. Constitutional: She is oriented to person, place, and time. She appears well-developed and well-nourished. No distress.  HENT:  Head: Normocephalic and atraumatic.  Eyes: Conjunctivae and EOM are normal.  Neck: Normal range of motion. Neck supple.  No meningeal signs  Cardiovascular: Normal rate, regular rhythm and normal heart sounds.  Exam reveals no gallop and no friction rub.   No murmur heard. Pulmonary/Chest: Effort normal and breath sounds normal. No respiratory distress. She has no wheezes. She has no rales. She exhibits no tenderness.  Abdominal: Soft. Bowel sounds are normal.  She exhibits no distension. There is no tenderness. There is no rebound and no guarding.  Musculoskeletal: Normal range of motion. She exhibits no edema and no tenderness.  Neurological: She is alert and oriented to person, place, and time. No cranial nerve deficit.  Skin: Skin is warm and dry. She is not diaphoretic. No erythema.    ED Course  Procedures (including critical care time) DIAGNOSTIC STUDIES: Oxygen Saturation is 98% on RA, Normal by my interpretation.    COORDINATION OF CARE: 11:28 PM-Discussed treatment plan which includes CXR, CBC panel, troponin and CMP with pt at bedside and pt agreed to plan.    Labs Review Labs Reviewed  CBC - Abnormal; Notable for the following:    WBC 10.8 (*)    All other components within normal limits  BASIC METABOLIC PANEL - Abnormal; Notable for the following:    Potassium 3.6 (*)    Glucose, Bld 127 (*)    GFR calc non Af Amer 75 (*)    GFR calc Af Amer 87 (*)    All other components within normal limits  TROPONIN I    Imaging Review Dg Chest 2 View  04/24/2014   CLINICAL DATA:  Sternal chest pain.  EXAM: CHEST  2 VIEW  COMPARISON:  None.  FINDINGS: The lungs are well-aerated. Mild vascular congestion is noted. There is no evidence of focal opacification, pleural effusion or pneumothorax.  The heart is normal in size; the mediastinal contour is within normal limits. No acute osseous abnormalities are seen.  IMPRESSION: Lungs remain grossly clear; mild vascular congestion noted. The sternum appears grossly intact, though difficult to fully assess.   Electronically Signed   By: Garald Balding M.D.   On: 04/24/2014 23:18    ED ECG REPORT  I personally interpreted this EKG   Date: 04/25/2014   Rate: 84  Rhythm: normal sinus rhythm  QRS Axis: normal  Intervals: normal  ST/T Wave abnormalities: normal  Conduction Disutrbances:none  Narrative Interpretation:   Old EKG Reviewed: none available   MDM   Final diagnoses:  Chest pain     Lungs and heart are clear - no abnormal lung sounds and she is in no resp distress, no inc WOB and speaking in full sentences.  Her ECG is normal without ischemic findings and she is CP free - in addition this is not reproducible to palpation.  She has a HEART score of 1.  She is CP free, her CXR shows no pna, no ptx and no other acute fidnigns.  She has no rub or murmur - doubt pericarditis, doubt dissection or PE adn possible Gastritis / GERD as she states that she had lots of "  burping" when this was going on.  Labs unremarkable, CXR without acute findings, will start zantac, have f/u with PMD - remains pain free.  I personally performed the services described in this documentation, which was scribed in my presence. The recorded information has been reviewed and is accurate.     Meds given in ED:  Medications - No data to display  New Prescriptions   RANITIDINE (ZANTAC) 150 MG CAPSULE    Take 1 capsule (150 mg total) by mouth daily.         Elizabeth Acosta, MD 04/25/14 [phone]

## 2014-04-24 NOTE — ED Notes (Signed)
Pt c/o sternal chest pain starting roughly 2 hours ago. Denies N/V/ dizziness. Does state it hurst more when taking a deep breath.

## 2014-04-25 LAB — BASIC METABOLIC PANEL
BUN: 13 mg/dL (ref 6–23)
CO2: 25 meq/L (ref 19–32)
Calcium: 9.2 mg/dL (ref 8.4–10.5)
Chloride: 102 mEq/L (ref 96–112)
Creatinine, Ser: 0.91 mg/dL (ref 0.50–1.10)
GFR calc Af Amer: 87 mL/min — ABNORMAL LOW (ref >90)
GFR calc non Af Amer: 75 mL/min — ABNORMAL LOW (ref >90)
GLUCOSE: 127 mg/dL — AB (ref 70–99)
Potassium: 3.6 mEq/L — ABNORMAL LOW (ref 3.7–5.3)
Sodium: 139 mEq/L (ref 137–147)

## 2014-04-25 LAB — CBC
HCT: 42.5 % (ref 36.0–46.0)
Hemoglobin: 14.8 g/dL (ref 12.0–15.0)
MCH: 29.1 pg (ref 26.0–34.0)
MCHC: 34.8 g/dL (ref 30.0–36.0)
MCV: 83.5 fL (ref 78.0–100.0)
Platelets: 338 10*3/uL (ref 150–400)
RBC: 5.09 MIL/uL (ref 3.87–5.11)
RDW: 13.7 % (ref 11.5–15.5)
WBC: 10.8 10*3/uL — AB (ref 4.0–10.5)

## 2014-04-25 LAB — TROPONIN I: Troponin I: <0.3 ng/mL (ref <0.30)

## 2014-04-25 MED ORDER — RANITIDINE HCL 150 MG PO CAPS: 150.0000 mg | ORAL_CAPSULE | Freq: Every day | ORAL | Status: DC

## 2014-04-25 NOTE — Discharge Instructions (Signed)
Please call your doctor for a followup appointment within 24-48 hours. When you talk to your doctor please let them know that you were seen in the emergency department and have them acquire all of your records so that they can discuss the findings with you and formulate a treatment plan to fully care for your new and ongoing problems. ° °

## 2014-04-26 ENCOUNTER — Encounter: Payer: Self-pay | Admitting: Family Medicine

## 2014-04-26 ENCOUNTER — Ambulatory Visit (INDEPENDENT_AMBULATORY_CARE_PROVIDER_SITE_OTHER): Payer: Self-pay | Admitting: Family Medicine

## 2014-04-26 VITALS — BP 152/98 | Ht 60.0 in | Wt 160.8 lb

## 2014-04-26 DIAGNOSIS — I10 Essential (primary) hypertension: Secondary | ICD-10-CM

## 2014-04-26 MED ORDER — LISINOPRIL 5 MG PO TABS: 5.0000 mg | ORAL_TABLET | Freq: Every day | ORAL | Status: DC

## 2014-04-26 NOTE — Progress Notes (Signed)
   Subjective:    Patient ID: Elizabeth Graves, female    DOB: 1966-01-06, 48 y.o.   MRN: [id number]  HPIHypertension. Blood pressure highest was 170/102. Went to hospital 04/24/13. Having chest pain and  Headache. Did EKG, chest xray, and bloodwork in the hospital.   No significant findings currently. Patient states that she was frustrated about her blood pressure she states she's trying to find a low cost method of getting this under control  Review of Systems  Constitutional: Negative for activity change, appetite change and fatigue.  Respiratory: Negative for cough and shortness of breath.   Cardiovascular: Negative for chest pain.  Gastrointestinal: Negative for abdominal pain.  Endocrine: Negative for polydipsia and polyphagia.  Genitourinary: Negative for frequency.  Neurological: Negative for weakness.  Psychiatric/Behavioral: Negative for confusion.       Objective:   Physical Exam  Vitals reviewed. Constitutional: She appears well-nourished. No distress.  Cardiovascular: Normal rate, regular rhythm and normal heart sounds.   No murmur heard. Pulmonary/Chest: Effort normal and breath sounds normal. No respiratory distress.  Musculoskeletal: She exhibits no edema.  Lymphadenopathy:    She has no cervical adenopathy.  Neurological: She is alert. She exhibits normal muscle tone.  Psychiatric: Her behavior is normal.          Assessment & Plan:  1. HTN (hypertension), benign Patient having a hard time affording medications we discussed multiple options go ahead with lisinopril 5 mg a day side effects and potential problems discussed also check metabolic 7 in approximately 14 days follow up again here to make sure blood pressure medicine doing appropriately in 4-6 weeks - Basic metabolic panel She was encouraged quit smoking

## 2014-04-27 ENCOUNTER — Telehealth: Payer: Self-pay | Admitting: Family Medicine

## 2014-04-27 NOTE — Telephone Encounter (Signed)
Patient was seen yesterday prescride blood pressure medication and was told to take a half pill the first week then a whole pill the second week but her bottle says whole pill and checkout summary states the same which is correct.

## 2014-04-27 NOTE — Telephone Encounter (Signed)
For the first week inorder to allow her body to get use to it use 1/2 THEN go to 1 qd thanks

## 2014-04-27 NOTE — Telephone Encounter (Signed)
Patient notified and verbalized understanding. 

## 2014-04-27 NOTE — Telephone Encounter (Signed)
Left message to return call 

## 2014-05-06 LAB — BASIC METABOLIC PANEL
BUN: 7 mg/dL (ref 6–23)
CO2: 26 mEq/L (ref 19–32)
Calcium: 9.6 mg/dL (ref 8.4–10.5)
Chloride: 103 mEq/L (ref 96–112)
Creat: 0.96 mg/dL (ref 0.50–1.10)
GLUCOSE: 136 mg/dL — AB (ref 70–99)
POTASSIUM: 4.3 meq/L (ref 3.5–5.3)
Sodium: 139 mEq/L (ref 135–145)

## 2014-09-28 ENCOUNTER — Telehealth: Payer: Self-pay | Admitting: *Deleted

## 2014-09-28 MED ORDER — ALPRAZOLAM 0.25 MG PO TABS: 0.2500 mg | ORAL_TABLET | Freq: Two times a day (BID) | ORAL | Status: DC | PRN

## 2014-09-28 NOTE — Telephone Encounter (Signed)
May have one refill will need office visit later this fall before further refills

## 2014-09-28 NOTE — Telephone Encounter (Signed)
Patient notified script will be faxed to pharmacy by the end of the day.

## 2014-09-28 NOTE — Telephone Encounter (Signed)
Pt requesting refill on xanax. Last seen in 03/2014. Med last prescribed 08/17/13 xanax 0.25mg  #20 one BID with 0 refills. [facility] [place].

## 2014-09-30 ENCOUNTER — Encounter (HOSPITAL_COMMUNITY): Payer: Self-pay | Admitting: Emergency Medicine

## 2014-09-30 ENCOUNTER — Emergency Department (HOSPITAL_COMMUNITY): Payer: Self-pay

## 2014-09-30 ENCOUNTER — Emergency Department (HOSPITAL_COMMUNITY)
Admission: EM | Admit: 2014-09-30 | Discharge: 2014-09-30 | Disposition: A | Discharge status: Home / Self Care | Payer: Self-pay | Attending: Emergency Medicine | Admitting: Emergency Medicine

## 2014-09-30 DIAGNOSIS — J209 Acute bronchitis, unspecified: Secondary | ICD-10-CM | POA: Insufficient documentation

## 2014-09-30 DIAGNOSIS — Z8639 Personal history of other endocrine, nutritional and metabolic disease: Secondary | ICD-10-CM | POA: Insufficient documentation

## 2014-09-30 DIAGNOSIS — Z720 Tobacco use: Secondary | ICD-10-CM | POA: Insufficient documentation

## 2014-09-30 DIAGNOSIS — Z8541 Personal history of malignant neoplasm of cervix uteri: Secondary | ICD-10-CM | POA: Insufficient documentation

## 2014-09-30 DIAGNOSIS — Z79899 Other long term (current) drug therapy: Secondary | ICD-10-CM | POA: Insufficient documentation

## 2014-09-30 DIAGNOSIS — I10 Essential (primary) hypertension: Secondary | ICD-10-CM | POA: Insufficient documentation

## 2014-09-30 LAB — BASIC METABOLIC PANEL
ANION GAP: 13 (ref 5–15)
BUN: 8 mg/dL (ref 6–23)
CO2: 25 mEq/L (ref 19–32)
Calcium: 9.2 mg/dL (ref 8.4–10.5)
Chloride: 102 mEq/L (ref 96–112)
Creatinine, Ser: 0.9 mg/dL (ref 0.50–1.10)
GFR calc Af Amer: 88 mL/min — ABNORMAL LOW (ref >90)
GFR, EST NON AFRICAN AMERICAN: 76 mL/min — AB (ref >90)
GLUCOSE: 130 mg/dL — AB (ref 70–99)
POTASSIUM: 3.8 meq/L (ref 3.7–5.3)
SODIUM: 140 meq/L (ref 137–147)

## 2014-09-30 LAB — CBC WITH DIFFERENTIAL/PLATELET
Basophils Absolute: 0 10*3/uL (ref 0.0–0.1)
Basophils Relative: 0 % (ref 0–1)
EOS PCT: 3 % (ref 0–5)
Eosinophils Absolute: 0.2 10*3/uL (ref 0.0–0.7)
HCT: 42.9 % (ref 36.0–46.0)
Hemoglobin: 14.6 g/dL (ref 12.0–15.0)
LYMPHS ABS: 2.4 10*3/uL (ref 0.7–4.0)
LYMPHS PCT: 30 % (ref 12–46)
MCH: 29.1 pg (ref 26.0–34.0)
MCHC: 34 g/dL (ref 30.0–36.0)
MCV: 85.5 fL (ref 78.0–100.0)
Monocytes Absolute: 0.6 10*3/uL (ref 0.1–1.0)
Monocytes Relative: 8 % (ref 3–12)
NEUTROS ABS: 4.8 10*3/uL (ref 1.7–7.7)
NEUTROS PCT: 59 % (ref 43–77)
PLATELETS: 328 10*3/uL (ref 150–400)
RBC: 5.02 MIL/uL (ref 3.87–5.11)
RDW: 13.7 % (ref 11.5–15.5)
WBC: 8.1 10*3/uL (ref 4.0–10.5)

## 2014-09-30 LAB — TROPONIN I: Troponin I: <0.3 ng/mL (ref <0.30)

## 2014-09-30 LAB — I-STAT TROPONIN, ED: TROPONIN I, POC: 0 ng/mL (ref 0.00–0.08)

## 2014-09-30 MED ORDER — ALBUTEROL SULFATE HFA 108 (90 BASE) MCG/ACT IN AERS
2.0000 | INHALATION_SPRAY | RESPIRATORY_TRACT | Status: DC | PRN
  Administered 2014-09-30: 2 via RESPIRATORY_TRACT
  Filled 2014-09-30: qty 6.7

## 2014-09-30 MED ORDER — ALBUTEROL SULFATE (2.5 MG/3ML) 0.083% IN NEBU
5.0000 mg | INHALATION_SOLUTION | Freq: Once | RESPIRATORY_TRACT | Status: AC
  Administered 2014-09-30: 5 mg via RESPIRATORY_TRACT
  Filled 2014-09-30: qty 6

## 2014-09-30 MED ORDER — AEROCHAMBER PLUS W/MASK MISC
1.0000 | Freq: Once | Status: AC
  Administered 2014-09-30: 1
  Filled 2014-09-30: qty 1

## 2014-09-30 NOTE — ED Notes (Signed)
Pt states she has had a cough for two weeks. States she is having pain in the center of her chest now.

## 2014-09-30 NOTE — Discharge Instructions (Signed)
Acute Bronchitis  Use your inhaler 2 puffs every 4 hours as needed for cough or shortness of breath. Return if needed more than every 4 hours Your blood sugar today was mildly elevated at 130. Call [facility] next week to schedule an office visit. Ask him to order a test known as hemoglobin A1c to check you for diabetes. Ask Dr.Luking to help you to stop smoking Bronchitis is when the airways that extend from the windpipe into the lungs get red, puffy, and painful (inflamed). Bronchitis often causes thick spit (mucus) to develop. This leads to a cough. A cough is the most common symptom of bronchitis. In acute bronchitis, the condition usually begins suddenly and goes away over time (usually in 2 weeks). Smoking, allergies, and asthma can make bronchitis worse. Repeated episodes of bronchitis may cause more lung problems. HOME CARE  Rest.  Drink enough fluids to keep your pee (urine) clear or pale yellow (unless you need to limit fluids as told by your doctor).  Only take over-the-counter or prescription medicines as told by your doctor.  Avoid smoking and secondhand smoke. These can make bronchitis worse. If you are a smoker, think about using nicotine gum or skin patches. Quitting smoking will help your lungs heal faster.  Reduce the chance of getting bronchitis again by:  Washing your hands often.  Avoiding people with cold symptoms.  Trying not to touch your hands to your mouth, nose, or eyes.  Follow up with your doctor as told. GET HELP IF: Your symptoms do not improve after 1 week of treatment. Symptoms include:  Cough.  Fever.  Coughing up thick spit.  Body aches.  Chest congestion.  Chills.  Shortness of breath.  Sore throat. GET HELP RIGHT AWAY IF:   You have an increased fever.  You have chills.  You have severe shortness of breath.  You have bloody thick spit (sputum).  You throw up (vomit) often.  You lose too much body fluid (dehydration).  You  have a severe headache.  You faint. MAKE SURE YOU:   Understand these instructions.  Will watch your condition.  Will get help right away if you are not doing well or get worse. Document Released: 06/03/2008 Document Revised: 08/18/2013 Document Reviewed: 06/08/2013 [employer] Patient Information 2015 [place], [place]. This information is not intended to replace advice given to you by your health care provider. Make sure you discuss any questions you have with your health care provider.

## 2014-09-30 NOTE — ED Provider Notes (Signed)
CSN: [id number]     Arrival date & time 09/30/14  1017 History  This chart was scribed for Orlie Dakin, MD by Molli Posey, ED Scribe. This patient was seen in room APA09/APA09 and the patient's care was started 11:44 AM.    Chief Complaint  Patient presents with  . Chest Pain    The history is provided by the patient. No language interpreter was used.   HPI Comments: Elizabeth Graves is a 48 y.o. female with a history of HTN, smoking, and GERD who presents to the Emergency Department complaining of intermittent chest pain with an associated cough for the past 2 weeks. She reports that her CP worsens with stress and states she has noticed its onset while smoking. She denies pain is exertional She reports she has an unproductive cough, denies fever and experiences mild pain while coughing. She states she is mildly SOB as an associated symptom. She reports that walking or deep breathing does not worsen her symptoms. The patient reports that her mother and father both had MI's in their 62s or 49s. She declines a history of hyperlipidemia or DM.  She declines fever, or chills. She was seen by Dr.Luking a few days ago and Xanax prescription was renewed   Cardiac risk factors hypertension smoking family history Past Medical History  Diagnosis Date  . SVD (spontaneous vaginal delivery)     x 2  . Cancer     cervical - 20 yrs ago  . Hypertension   . Seasonal allergies     tx with benadryl prn  . Hypokalemia     tx w/ k-Dur   Past Surgical History  Procedure Laterality Date  . Tubal ligation    . Dilation and curettage of uterus  12/2010    polyp removed  . Dilation and curettage of uterus      x 3 for MAB  . Co2 laser of cervix      for cervical cancer 20 yrs ago  . Laparoscopic assisted vaginal hysterectomy  06/09/2012    Procedure: LAPAROSCOPIC ASSISTED VAGINAL HYSTERECTOMY;  Surgeon: Gus Height, MD;  Location: [facility];  Service: Gynecology;  Laterality: N/A;  . Salpingoophorectomy   06/09/2012    Procedure: SALPINGO OOPHERECTOMY;  Surgeon: Gus Height, MD;  Location: [facility];  Service: Gynecology;  Laterality: Bilateral;  . Abdominal hysterectomy     No family history on file. History  Substance Use Topics  . Smoking status: Current Every Day Smoker -- 1.00 packs/day for 16 years    Types: Cigarettes  . Smokeless tobacco: Never Used  . Alcohol Use: No   OB History   Grav Para Term Preterm Abortions TAB SAB Ect Mult Living                 Review of Systems  Constitutional: Negative for fever and chills.  Respiratory: Positive for cough and shortness of breath.   Cardiovascular: Positive for chest pain.  All other systems reviewed and are negative.     Allergies  Review of patient's allergies indicates no known allergies.  Home Medications   Prior to Admission medications   Medication Sig Start Date End Date Taking? Authorizing Provider  ALPRAZolam (XANAX) 0.25 MG tablet Take 1 tablet (0.25 mg total) by mouth 2 (two) times daily as needed for anxiety. 09/28/14  Yes Kathyrn Drown, MD  diphenhydrAMINE (BENADRYL) 25 mg capsule Take 25 mg by mouth every 6 (six) hours as needed for allergies.   Yes Historical Provider,  MD  ibuprofen (ADVIL,MOTRIN) 200 MG tablet Take 600 mg by mouth every 6 (six) hours as needed for moderate pain.   Yes Historical Provider, MD  lisinopril (PRINIVIL,ZESTRIL) 5 MG tablet Take 1 tablet (5 mg total) by mouth daily. 04/26/14 04/26/15 Yes Kathyrn Drown, MD  ranitidine (ZANTAC) 150 MG capsule Take 1 capsule (150 mg total) by mouth daily. 04/25/14  Yes [name], MD   BP 132/81  Pulse 88  Temp(Src) 97.9 F (36.6 C)  Resp 19  Ht 4\' 9"  (1.448 m)  Wt 165 lb (74.844 kg)  BMI 35.70 kg/m2  SpO2 98%  Physical Exam  Nursing note and vitals reviewed. Constitutional: She appears well-developed and well-nourished.  HENT:  Head: Normocephalic and atraumatic.  Eyes: Conjunctivae are normal. Pupils are equal, round, and reactive to  light.  Neck: Neck supple. No tracheal deviation present. No thyromegaly present.  Cardiovascular: Normal rate and regular rhythm.   No murmur heard. Pulmonary/Chest: Effort normal and breath sounds normal. She exhibits tenderness.  Mildly tender over sternum, reproducing pain  Abdominal: Soft. Bowel sounds are normal. She exhibits no distension. There is no tenderness.  Musculoskeletal: Normal range of motion. She exhibits no edema and no tenderness.  Neurological: She is alert. Coordination normal.  Skin: Skin is warm and dry. No rash noted.  Psychiatric: She has a normal mood and affect.    ED Course  Procedures   DIAGNOSTIC STUDIES: Oxygen Saturation is 98% on room air, normal by my interpretation.    COORDINATION OF CARE: 11:48 AM Discussed treatment plan with pt at bedside and pt agreed to plan. Will order CXR and breathing treatment.    Labs Review Labs Reviewed - No data to display  Imaging Review No results found.   EKG Interpretation   Date/Time:  Friday September 30 2014 10:31:42 EDT Ventricular Rate:  87 PR Interval:  154 QRS Duration: 82 QT Interval:  356 QTC Calculation: 428 R Axis:   17 Text Interpretation:  Sinus rhythm No significant change since last  tracing Confirmed by Winfred Leeds  MD, Xenia Nile [phone]) on 09/30/2014 11:54:10 AM     Patient feels improved after treatment with albuterol neb Chest x-ray viewed by me. Results for orders placed during the hospital encounter of 09/30/14  TROPONIN I      Result Value Ref Range   Troponin I <0.30  <0.30 ng/mL  BASIC METABOLIC PANEL      Result Value Ref Range   Sodium 140  137 - 147 mEq/L   Potassium 3.8  3.7 - 5.3 mEq/L   Chloride 102  96 - 112 mEq/L   CO2 25  19 - 32 mEq/L   Glucose, Bld 130 (*) 70 - 99 mg/dL   BUN 8  6 - 23 mg/dL   Creatinine, Ser 0.90  0.50 - 1.10 mg/dL   Calcium 9.2  8.4 - 10.5 mg/dL   GFR calc non Af Amer 76 (*) >90 mL/min   GFR calc Af Amer 88 (*) >90 mL/min   Anion gap 13  5 -  15  CBC WITH DIFFERENTIAL      Result Value Ref Range   WBC 8.1  4.0 - 10.5 K/uL   RBC 5.02  3.87 - 5.11 MIL/uL   Hemoglobin 14.6  12.0 - 15.0 g/dL   HCT 42.9  36.0 - 46.0 %   MCV 85.5  78.0 - 100.0 fL   MCH 29.1  26.0 - 34.0 pg   MCHC 34.0  30.0 -  36.0 g/dL   RDW 13.7  11.5 - 15.5 %   Platelets 328  150 - 400 K/uL   Neutrophils Relative % 59  43 - 77 %   Neutro Abs 4.8  1.7 - 7.7 K/uL   Lymphocytes Relative 30  12 - 46 %   Lymphs Abs 2.4  0.7 - 4.0 K/uL   Monocytes Relative 8  3 - 12 %   Monocytes Absolute 0.6  0.1 - 1.0 K/uL   Eosinophils Relative 3  0 - 5 %   Eosinophils Absolute 0.2  0.0 - 0.7 K/uL   Basophils Relative 0  0 - 1 %   Basophils Absolute 0.0  0.0 - 0.1 K/uL  I-STAT TROPOININ, ED      Result Value Ref Range   Troponin i, poc 0.00  0.00 - 0.08 ng/mL   Comment 3            Dg Chest 2 View  09/30/2014   CLINICAL DATA:  48 year old female non radiating mid chest pain for 2 weeks with nonproductive cough. Intermittent bilateral upper extremity numbness. Initial encounter. Current history of smoking, cervical cancer.  EXAM: CHEST  2 VIEW  COMPARISON:  04/24/2014.  FINDINGS: Lung volumes are stable and within normal limits. Normal cardiac size and mediastinal contours. Visualized tracheal air column is within normal limits. No pneumothorax, pleural effusion or consolidation. Widespread increased interstitial markings are stable since April. No acute or confluent pulmonary opacity. No pulmonary nodules *CRASH* that no discrete pulmonary nodule identified. No acute osseous abnormality identified.  IMPRESSION: Stable.  No acute or metastatic cardiopulmonary abnormality.   Electronically Signed   By: Lars Pinks M.D.   On: 09/30/2014 13:07    MDM  Strongly doubt cardiac etiology based on history. Heart score equals 3. Symptoms most consistent with bronchitis Final diagnoses:  None   plan albuterol HFA with spacer to go to use 2 puffs every 4 hours as needed for cough or  shortness of breath. Counseled patient for 5 minutes on smoking cessation. Follow up with Dr.Luking concerning tobacco abuse and hyperglycemia Diagnosis #1 acute bronchitis #2 hyperglycemia  I personally performed the services described in this documentation, which was scribed in my presence. The recorded information has been reviewed and considered.    Orlie Dakin, MD 09/30/14 1340

## 2014-09-30 NOTE — Progress Notes (Signed)
ED/CM noted patient did not have health insurance and/or PCP listed in the computer.  Patient was given the [facility] resources handout with information on the clinics, food pantries, and the handout for new health insurance sign-up.  Also provided drug discount card. Patient expressed appreciation for information received.

## 2014-10-26 ENCOUNTER — Ambulatory Visit: Payer: Self-pay | Admitting: Family Medicine

## 2015-03-22 ENCOUNTER — Encounter (HOSPITAL_COMMUNITY): Payer: Self-pay

## 2015-03-22 ENCOUNTER — Emergency Department (HOSPITAL_COMMUNITY)
Admission: EM | Admit: 2015-03-22 | Discharge: 2015-03-22 | Disposition: A | Discharge status: Home / Self Care | Payer: Self-pay | Attending: Emergency Medicine | Admitting: Emergency Medicine

## 2015-03-22 ENCOUNTER — Emergency Department (HOSPITAL_COMMUNITY): Payer: Self-pay

## 2015-03-22 DIAGNOSIS — Z8639 Personal history of other endocrine, nutritional and metabolic disease: Secondary | ICD-10-CM | POA: Insufficient documentation

## 2015-03-22 DIAGNOSIS — Z8541 Personal history of malignant neoplasm of cervix uteri: Secondary | ICD-10-CM | POA: Insufficient documentation

## 2015-03-22 DIAGNOSIS — J410 Simple chronic bronchitis: Secondary | ICD-10-CM

## 2015-03-22 DIAGNOSIS — Z720 Tobacco use: Secondary | ICD-10-CM | POA: Insufficient documentation

## 2015-03-22 DIAGNOSIS — I10 Essential (primary) hypertension: Secondary | ICD-10-CM | POA: Insufficient documentation

## 2015-03-22 DIAGNOSIS — Z79899 Other long term (current) drug therapy: Secondary | ICD-10-CM | POA: Insufficient documentation

## 2015-03-22 DIAGNOSIS — J069 Acute upper respiratory infection, unspecified: Secondary | ICD-10-CM | POA: Insufficient documentation

## 2015-03-22 MED ORDER — ALBUTEROL SULFATE HFA 108 (90 BASE) MCG/ACT IN AERS
2.0000 | INHALATION_SPRAY | RESPIRATORY_TRACT | Status: DC | PRN
  Administered 2015-03-22: 2 via RESPIRATORY_TRACT
  Filled 2015-03-22: qty 6.7

## 2015-03-22 NOTE — ED Notes (Signed)
RT made aware pt needs inhaler.

## 2015-03-22 NOTE — Discharge Instructions (Signed)
Smoking Cessation Quitting smoking is important to your health and has many advantages. However, it is not always easy to quit since nicotine is a very addictive drug. Oftentimes, people try 3 times or more before being able to quit. This document explains the best ways for you to prepare to quit smoking. Quitting takes hard work and a lot of effort, but you can do it. ADVANTAGES OF QUITTING SMOKING  You will live longer, feel better, and live better.  Your body will feel the impact of quitting smoking almost immediately.  Within 20 minutes, blood pressure decreases. Your pulse returns to its normal level.  After 8 hours, carbon monoxide levels in the blood return to normal. Your oxygen level increases.  After 24 hours, the chance of having a heart attack starts to decrease. Your breath, hair, and body stop smelling like smoke.  After 48 hours, damaged nerve endings begin to recover. Your sense of taste and smell improve.  After 72 hours, the body is virtually free of nicotine. Your bronchial tubes relax and breathing becomes easier.  After 2 to 12 weeks, lungs can hold more air. Exercise becomes easier and circulation improves.  The risk of having a heart attack, stroke, cancer, or lung disease is greatly reduced.  After 1 year, the risk of coronary heart disease is cut in half.  After 5 years, the risk of stroke falls to the same as a nonsmoker.  After 10 years, the risk of lung cancer is cut in half and the risk of other cancers decreases significantly.  After 15 years, the risk of coronary heart disease drops, usually to the level of a nonsmoker.  If you are pregnant, quitting smoking will improve your chances of having a healthy baby.  The people you live with, especially any children, will be healthier.  You will have extra money to spend on things other than cigarettes. QUESTIONS TO THINK ABOUT BEFORE ATTEMPTING TO QUIT You may want to talk about your answers with your  health care provider.  Why do you want to quit?  If you tried to quit in the past, what helped and what did not?  What will be the most difficult situations for you after you quit? How will you plan to handle them?  Who can help you through the tough times? Your family? Friends? A health care provider?  What pleasures do you get from smoking? What ways can you still get pleasure if you quit? Here are some questions to ask your health care provider:  How can you help me to be successful at quitting?  What medicine do you think would be best for me and how should I take it?  What should I do if I need more help?  What is smoking withdrawal like? How can I get information on withdrawal? GET READY  Set a quit date.  Change your environment by getting rid of all cigarettes, ashtrays, matches, and lighters in your home, car, or work. Do not let people smoke in your home.  Review your past attempts to quit. Think about what worked and what did not. GET SUPPORT AND ENCOURAGEMENT You have a better chance of being successful if you have help. You can get support in many ways.  Tell your family, friends, and coworkers that you are going to quit and need their support. Ask them not to smoke around you.  Get individual, group, or telephone counseling and support. Programs are available at [employer] and health centers. Call  your local health department for information about programs in your area.  Spiritual beliefs and practices may help some smokers quit.  Download a "quit meter" on your computer to keep track of quit statistics, such as how long you have gone without smoking, cigarettes not smoked, and money saved.  Get a self-help book about quitting smoking and staying off tobacco. Manor yourself from urges to smoke. Talk to someone, go for a walk, or occupy your time with a task.  Change your normal routine. Take a different route to work.  Drink tea instead of coffee. Eat breakfast in a different place.  Reduce your stress. Take a hot bath, exercise, or read a book.  Plan something enjoyable to do every day. Reward yourself for not smoking.  Explore interactive web-based programs that specialize in helping you quit. GET MEDICINE AND USE IT CORRECTLY Medicines can help you stop smoking and decrease the urge to smoke. Combining medicine with the above behavioral methods and support can greatly increase your chances of successfully quitting smoking.  Nicotine replacement therapy helps deliver nicotine to your body without the negative effects and risks of smoking. Nicotine replacement therapy includes nicotine gum, lozenges, inhalers, nasal sprays, and skin patches. Some may be available over-the-counter and others require a prescription.  Antidepressant medicine helps people abstain from smoking, but how this works is unknown. This medicine is available by prescription.  Nicotinic receptor partial agonist medicine simulates the effect of nicotine in your brain. This medicine is available by prescription. Ask your health care provider for advice about which medicines to use and how to use them based on your health history. Your health care provider will tell you what side effects to look out for if you choose to be on a medicine or therapy. Carefully read the information on the package. Do not use any other product containing nicotine while using a nicotine replacement product.  RELAPSE OR DIFFICULT SITUATIONS Most relapses occur within the first 3 months after quitting. Do not be discouraged if you start smoking again. Remember, most people try several times before finally quitting. You may have symptoms of withdrawal because your body is used to nicotine. You may crave cigarettes, be irritable, feel very hungry, cough often, get headaches, or have difficulty concentrating. The withdrawal symptoms are only temporary. They are strongest  when you first quit, but they will go away within 10-14 days. To reduce the chances of relapse, try to:  Avoid drinking alcohol. Drinking lowers your chances of successfully quitting.  Reduce the amount of caffeine you consume. Once you quit smoking, the amount of caffeine in your body increases and can give you symptoms, such as a rapid heartbeat, sweating, and anxiety.  Avoid smokers because they can make you want to smoke.  Do not let weight gain distract you. Many smokers will gain weight when they quit, usually less than 10 pounds. Eat a healthy diet and stay active. You can always lose the weight gained after you quit.  Find ways to improve your mood other than smoking. FOR MORE INFORMATION  www.smokefree.gov  Document Released: 12/10/2001 Document Revised: 05/02/2014 Document Reviewed: 03/26/2012 [facility] Patient Information 2015 [place], [place]. This information is not intended to replace advice given to you by your health care provider. Make sure you discuss any questions you have with your health care provider. Cough, Adult  A cough is a reflex. It helps you clear your throat and airways. A cough can help heal your  body. A cough can last 2 or 3 weeks (acute) or may last more than 8 weeks (chronic). Some common causes of a cough can include an infection, allergy, or a cold. HOME CARE  Only take medicine as told by your doctor.  If given, take your medicines (antibiotics) as told. Finish them even if you start to feel better.  Use a cold steam vaporizer or humidifier in your home. This can help loosen thick spit (secretions).  Sleep so you are almost sitting up (semi-upright). Use pillows to do this. This helps reduce coughing.  Rest as needed.  Stop smoking if you smoke. GET HELP RIGHT AWAY IF:  You have yellowish-white fluid (pus) in your thick spit.  Your cough gets worse.  Your medicine does not reduce coughing, and you are losing sleep.  You cough up blood.  You  have trouble breathing.  Your pain gets worse and medicine does not help.  You have a fever. MAKE SURE YOU:   Understand these instructions.  Will watch your condition.  Will get help right away if you are not doing well or get worse. Document Released: 08/29/2011 Document Revised: 05/02/2014 Document Reviewed: 08/29/2011 [facility] Patient Information 2015 [place], [place]. This information is not intended to replace advice given to you by your health care provider. Make sure you discuss any questions you have with your health care provider.

## 2015-03-22 NOTE — ED Notes (Signed)
Pt reports nonproductive cough x 2 weeks, denies fever.   Reports has been waking up at night feeling hot and SOB.  Pt also c/o pain and burning in center of chest.

## 2015-03-22 NOTE — ED Provider Notes (Signed)
CSN: [id number]     Arrival date & time 03/22/15  7035 History  This chart was scribed for Pattricia Boss, MD by Einar Pheasant, Medical Scribe. This patient was seen in room APA09/APA09 and the patient's care was started at 10:29 AM.     Chief Complaint  Patient presents with  . Cough    Patient is a 49 y.o. female presenting with cough. The history is provided by the patient and medical records. No language interpreter was used.  Cough Cough characteristics:  Non-productive Severity:  Mild Onset quality:  Gradual Duration:  2 weeks Timing:  Constant Progression:  Unchanged Chronicity:  New Smoker: yes   Relieved by:  Nothing Worsened by:  Deep breathing Ineffective treatments:  None tried Associated symptoms: shortness of breath   Associated symptoms: no chills, no fever and no rash    HPI Comments: Elizabeth Graves is a 49 y.o. female with PMhx of tobacco use and GERD presents to the Emergency Department complaining of gradual onset, persistent non-productive cough that has been ongoing for the past 2 weeks. With associated SOB and exacerbation of seasonal allergies. Pt states that she is also experiencing intermittent chest burning secondary to the forceful nature of her cough. She admits to smoking 5 cigarettes prior to her arrival here in the ED. Pt denies any fever, neck pain, sore throat, visual disturbance, CP,  abdominal pain, h/o DVT, nausea, emesis, diarrhea, urinary symptoms, weakness, numbness and rash as associated symptoms.    Past Medical History  Diagnosis Date  . SVD (spontaneous vaginal delivery)     x 2  . Cancer     cervical - 20 yrs ago  . Hypertension   . Seasonal allergies     tx with benadryl prn  . Hypokalemia     tx w/ k-Dur   Past Surgical History  Procedure Laterality Date  . Tubal ligation    . Dilation and curettage of uterus  12/2010    polyp removed  . Dilation and curettage of uterus      x 3 for MAB  . Co2 laser of cervix      for cervical  cancer 20 yrs ago  . Laparoscopic assisted vaginal hysterectomy  06/09/2012    Procedure: LAPAROSCOPIC ASSISTED VAGINAL HYSTERECTOMY;  Surgeon: Gus Height, MD;  Location: [facility];  Service: Gynecology;  Laterality: N/A;  . Salpingoophorectomy  06/09/2012    Procedure: SALPINGO OOPHERECTOMY;  Surgeon: Gus Height, MD;  Location: [facility];  Service: Gynecology;  Laterality: Bilateral;  . Abdominal hysterectomy     No family history on file. History  Substance Use Topics  . Smoking status: Current Every Day Smoker -- 1.00 packs/day for 16 years    Types: Cigarettes  . Smokeless tobacco: Never Used  . Alcohol Use: No   OB History    No data available     Review of Systems  Constitutional: Negative for fever and chills.  Respiratory: Positive for cough and shortness of breath.   Gastrointestinal: Negative for nausea and vomiting.  Skin: Negative for rash.  Neurological: Negative for weakness and numbness.  All other systems reviewed and are negative.  Allergies  Review of patient's allergies indicates no known allergies.  Home Medications   Prior to Admission medications   Medication Sig Start Date End Date Taking? Authorizing Provider  ALPRAZolam (XANAX) 0.25 MG tablet Take 1 tablet (0.25 mg total) by mouth 2 (two) times daily as needed for anxiety. 09/28/14   Kathyrn Drown,  MD  diphenhydrAMINE (BENADRYL) 25 mg capsule Take 25 mg by mouth every 6 (six) hours as needed for allergies.    Historical Provider, MD  ibuprofen (ADVIL,MOTRIN) 200 MG tablet Take 600 mg by mouth every 6 (six) hours as needed for moderate pain.    Historical Provider, MD  lisinopril (PRINIVIL,ZESTRIL) 5 MG tablet Take 1 tablet (5 mg total) by mouth daily. 04/26/14 04/26/15  Kathyrn Drown, MD  ranitidine (ZANTAC) 150 MG capsule Take 1 capsule (150 mg total) by mouth daily. 04/25/14   Noemi Chapel, MD   BP 125/93 mmHg  Pulse 92  Temp(Src) 97.9 F (36.6 C) (Oral)  Resp 18  Ht 4\' 9"  (1.448 m)  Wt 155 lb (70.308  kg)  BMI 33.53 kg/m2  SpO2 95%  Physical Exam  Constitutional: She is oriented to person, place, and time. She appears well-developed and well-nourished.  HENT:  Head: Normocephalic and atraumatic.  Right Ear: External ear normal.  Left Ear: External ear normal.  Nose: Nose normal.  Mouth/Throat: Oropharynx is clear and moist.  Eyes: Conjunctivae and EOM are normal. Pupils are equal, round, and reactive to light.  Neck: Normal range of motion. Neck supple. No JVD present. No tracheal deviation present. No thyromegaly present.  Cardiovascular: Normal rate, regular rhythm, normal heart sounds and intact distal pulses.   Pulmonary/Chest: Effort normal. She has decreased breath sounds. She has no wheezes. She has rhonchi.  Scattered rhonchi at the bases. Decreased air movement throughout.   Abdominal: Soft. Bowel sounds are normal. She exhibits no mass. There is no tenderness. There is no guarding.  Musculoskeletal: Normal range of motion.  Lymphadenopathy:    She has no cervical adenopathy.  Neurological: She is alert and oriented to person, place, and time. She has normal reflexes. No cranial nerve deficit or sensory deficit. Gait normal. GCS eye subscore is 4. GCS verbal subscore is 5. GCS motor subscore is 6.  Reflex Scores:      Bicep reflexes are 2+ on the right side and 2+ on the left side.      Patellar reflexes are 2+ on the right side and 2+ on the left side. Strength is normal and equal throughout. Cranial nerves grossly intact. Patient fluent. No gross ataxia and patient able to ambulate without difficulty.  Skin: Skin is warm and dry.  Psychiatric: She has a normal mood and affect. Her behavior is normal. Judgment and thought content normal.  Nursing note and vitals reviewed.   ED Course  Procedures (including critical care time)  DIAGNOSTIC STUDIES: Oxygen Saturation is 95% on RA, adequate by my interpretation.    COORDINATION OF CARE: 10:32 AM- Smoking cessation  discussed with the pt. Pt advised of plan for treatment and pt agrees.  Labs Review Labs Reviewed - No data to display  Imaging Review No results found.   EKG Interpretation None      MDM   Final diagnoses:  URI (upper respiratory infection)  Smokers' cough     I personally performed the services described in this documentation, which was scribed in my presence. The recorded information has been reviewed and considered.    Pattricia Boss, MD 03/22/15 [phone]

## 2015-12-20 ENCOUNTER — Ambulatory Visit (INDEPENDENT_AMBULATORY_CARE_PROVIDER_SITE_OTHER): Payer: Self-pay | Admitting: Nurse Practitioner

## 2015-12-20 ENCOUNTER — Encounter: Payer: Self-pay | Admitting: Nurse Practitioner

## 2015-12-20 VITALS — BP 128/80 | Ht 59.0 in | Wt 148.0 lb

## 2015-12-20 DIAGNOSIS — Z131 Encounter for screening for diabetes mellitus: Secondary | ICD-10-CM

## 2015-12-20 DIAGNOSIS — I10 Essential (primary) hypertension: Secondary | ICD-10-CM

## 2015-12-20 DIAGNOSIS — F419 Anxiety disorder, unspecified: Secondary | ICD-10-CM

## 2015-12-20 LAB — POCT GLYCOSYLATED HEMOGLOBIN (HGB A1C): Hemoglobin A1C: 5.3

## 2015-12-20 MED ORDER — ALPRAZOLAM 0.25 MG PO TABS
0.2500 mg | ORAL_TABLET | Freq: Two times a day (BID) | ORAL | Status: DC | PRN
Start: 2015-12-20 — End: 2017-06-23

## 2015-12-20 MED ORDER — LISINOPRIL 5 MG PO TABS: 5.0000 mg | ORAL_TABLET | Freq: Every day | ORAL | Status: DC

## 2015-12-20 MED ORDER — ALBUTEROL SULFATE 108 (90 BASE) MCG/ACT IN AEPB: 2.0000 | INHALATION_SPRAY | RESPIRATORY_TRACT | Status: DC | PRN

## 2015-12-22 ENCOUNTER — Encounter: Payer: Self-pay | Admitting: Nurse Practitioner

## 2015-12-22 DIAGNOSIS — F419 Anxiety disorder, unspecified: Secondary | ICD-10-CM | POA: Insufficient documentation

## 2015-12-22 NOTE — Progress Notes (Signed)
Subjective:  Presents for recheck on her blood pressure. Very active job. Has been working on her weight loss. Eating healthy. Shortness of breath has improved, has cut back from one pack per day smoking to one half pack per day. Has not been using her inhaler. Has been off her lisinopril for about 1 month. Both parents have history of heart disease. Her mother also had lung cancer. Went to the emergency room in March, all cardiac testing was negative. Note the patient smoked a cigarette right before her office visit today.takes a rare Xanax for mild anxiety.  Objective:   BP 128/80 mmHg  Ht 4\' 11"  (1.499 m)  Wt 148 lb (67.132 kg)  BMI 29.88 kg/m2 NAD. Alert, oriented. Cheerful affect. Lungs clear. Heart regular rate rhythm. Lower extremities no edema. Results for orders placed or performed in visit on 12/20/15  POCT glycosylated hemoglobin (Hb A1C)  Result Value Ref Range   Hemoglobin A1C 5.3      Assessment:  Problem List Items Addressed This Visit      Cardiovascular and Mediastinum   HTN (hypertension), benign - Primary   Relevant Medications   lisinopril (PRINIVIL,ZESTRIL) 5 MG tablet     Other   Mild anxiety   Relevant Medications   ALPRAZolam (XANAX) 0.25 MG tablet    Other Visit Diagnoses    Screening for diabetes mellitus        Relevant Orders    POCT glycosylated hemoglobin (Hb A1C) (Completed)      Plan:  Meds ordered this encounter  Medications  . lisinopril (PRINIVIL,ZESTRIL) 5 MG tablet    Sig: Take 1 tablet (5 mg total) by mouth daily.    Dispense:  30 tablet    Refill:  5    Order Specific Question:  Supervising Provider    Answer:  Mikey Kirschner [[id number]]  . ALPRAZolam (XANAX) 0.25 MG tablet    Sig: Take 1 tablet (0.25 mg total) by mouth 2 (two) times daily as needed for anxiety.    Dispense:  20 tablet    Refill:  1    Order Specific Question:  Supervising Provider    Answer:  Mikey Kirschner [[id number]]  . Albuterol Sulfate (PROAIR RESPICLICK) [id number]  (90 BASE) MCG/ACT AEPB    Sig: Inhale 2 puffs into the lungs every 4 (four) hours as needed.    Dispense:  1 each    Refill:  0    Please dispense Pro Air Respiclick    Order Specific Question:  Supervising Provider    Answer:  Mikey Kirschner [[id number]]    Patient remains uninsured. A review of the chart shows slightly elevated glucose over the past few years but most of these appear to be nonfasting. Hemoglobin A1c today is normal. Diabetes removed from her problem list Encouraged continued healthy diet and weight loss. Also vitamin D and calcium supplementation. Recommend preventive health physical and routine lab work, patient defers for now due to finances. Return in about 6 months (around 06/19/2016) for recheck.

## 2016-06-12 ENCOUNTER — Other Ambulatory Visit: Payer: Self-pay | Admitting: Nurse Practitioner

## 2016-07-14 ENCOUNTER — Other Ambulatory Visit: Payer: Self-pay | Admitting: Family Medicine

## 2016-07-16 ENCOUNTER — Telehealth: Payer: Self-pay | Admitting: Family Medicine

## 2016-07-16 MED ORDER — LISINOPRIL 5 MG PO TABS: 5.0000 mg | ORAL_TABLET | Freq: Every day | ORAL | Status: DC

## 2016-07-16 NOTE — Telephone Encounter (Signed)
Spoke with patient and informed her that a refill on her Lisinopril was sent into pharmacy. Patient verbalized understanding.

## 2016-07-16 NOTE — Telephone Encounter (Signed)
Patient requesting Rx for lisinopril (PRINIVIL,ZESTRIL) 5 MG tablet to [facility].  She has a followup appointment on August 16, 2016.

## 2016-08-16 ENCOUNTER — Encounter: Payer: Self-pay | Admitting: Family Medicine

## 2016-08-16 ENCOUNTER — Ambulatory Visit (INDEPENDENT_AMBULATORY_CARE_PROVIDER_SITE_OTHER): Payer: Self-pay | Admitting: Family Medicine

## 2016-08-16 VITALS — BP 118/78 | Ht 59.0 in | Wt 156.0 lb

## 2016-08-16 DIAGNOSIS — Z139 Encounter for screening, unspecified: Secondary | ICD-10-CM

## 2016-08-16 DIAGNOSIS — Z13220 Encounter for screening for lipoid disorders: Secondary | ICD-10-CM

## 2016-08-16 DIAGNOSIS — I10 Essential (primary) hypertension: Secondary | ICD-10-CM

## 2016-08-16 MED ORDER — LISINOPRIL 5 MG PO TABS: 5.0000 mg | ORAL_TABLET | Freq: Every day | ORAL | 11 refills | Status: DC

## 2016-08-16 NOTE — Progress Notes (Signed)
   Subjective:    Patient ID: Elizabeth Graves, female    DOB: November 25, 1966, 50 y.o.   MRN: [id number]  Hypertension  This is a chronic problem. The current episode started more than 1 year ago. Pertinent negatives include no chest pain, headaches or shortness of breath. Treatments tried: lisinopril. There are no compliance problems.    Pt states no problems or concerns.  Patient denies any chest tightness pressure pain shortness of breath she still smokes she know she needs to quit  Review of Systems  Constitutional: Negative for activity change, fatigue and fever.  Respiratory: Negative for cough and shortness of breath.   Cardiovascular: Negative for chest pain and leg swelling.  Neurological: Negative for headaches.       Objective:   Physical Exam  Constitutional: She appears well-nourished. No distress.  Cardiovascular: Normal rate, regular rhythm and normal heart sounds.   No murmur heard. Pulmonary/Chest: Effort normal and breath sounds normal. No respiratory distress.  Musculoskeletal: She exhibits no edema.  Lymphadenopathy:    She has no cervical adenopathy.  Neurological: She is alert. She exhibits normal muscle tone.  Psychiatric: Her behavior is normal.  Vitals reviewed.         Assessment & Plan:  Patient states her get blood work done later this fall when she has insurance bloodwork was ordered Hypertension good control watch diet lab work pending Patient to follow-up in approximately 9-11 months Patient encouraged quit smoking Patient states she might use Chantix if it's covered by her new insurance

## 2016-10-04 ENCOUNTER — Telehealth: Payer: Self-pay | Admitting: Family Medicine

## 2016-10-04 MED ORDER — VARENICLINE TARTRATE 1 MG PO TABS: 1.0000 mg | ORAL_TABLET | Freq: Two times a day (BID) | ORAL | 1 refills | Status: DC

## 2016-10-04 MED ORDER — VARENICLINE TARTRATE 0.5 MG X 11 & 1 MG X 42 PO MISC: ORAL | 0 refills | Status: DC

## 2016-10-04 MED ORDER — ALBUTEROL SULFATE HFA 108 (90 BASE) MCG/ACT IN AERS: 2.0000 | INHALATION_SPRAY | Freq: Four times a day (QID) | RESPIRATORY_TRACT | 2 refills | Status: DC | PRN

## 2016-10-04 NOTE — Telephone Encounter (Signed)
Tried to call no answer (Voicemail box not setup) Meds were sent and faxed to pharmacy.

## 2016-10-04 NOTE — Telephone Encounter (Signed)
Pt wants albuterol inhaler and chantix

## 2016-10-04 NOTE — Telephone Encounter (Signed)
Patient said she was told to call our office when her insurance went in to affect and Dr. Nicki Reaper would call in her Inhaler and chantix.  [facility]

## 2016-10-04 NOTE — Telephone Encounter (Signed)
Albuterol inhaler 2 puffs every 4 hours when necessary, 1 inhaler, 5 refills. Also Chantix 1 starter pack, 2 maintenance packs. Patient does need to be aware that Chantix does a good job and in a small number of people if it causes severe disturbing dream's, or if it causes depression, or if it causes a person to be suicidal day must stop the medication immediately and to follow-up with [place] if any problems immediately-please document that the patient is aware of this

## 2016-10-23 NOTE — Telephone Encounter (Signed)
Left message to return call 

## 2016-10-23 NOTE — Telephone Encounter (Signed)
Discussed with patient. Advise patient that Chantix does a good job and in a small number of people if it causes severe disturbing dream's, or if it causes depression, or if it causes a person to be suicidal day must stop the medication immediately and to follow-up with [place] if any problems immediately -patient verbalized understanding and stated the pharmacist reviewed everything with her and she has been on med a few weeks and doing very well.

## 2016-10-29 ENCOUNTER — Other Ambulatory Visit: Payer: Self-pay | Admitting: Family Medicine

## 2016-10-30 NOTE — Telephone Encounter (Signed)
This patient will need to call to get this medication so we can make sure she understands proper instruction

## 2016-11-13 LAB — BASIC METABOLIC PANEL
BUN/Creatinine Ratio: 14 (ref 9–23)
BUN: 13 mg/dL (ref 6–24)
CO2: 23 mmol/L (ref 18–29)
Calcium: 9.7 mg/dL (ref 8.7–10.2)
Chloride: 100 mmol/L (ref 96–106)
Creatinine, Ser: 0.92 mg/dL (ref 0.57–1.00)
GFR calc Af Amer: 86 mL/min/{1.73_m2} (ref >59)
GFR, EST NON AFRICAN AMERICAN: 74 mL/min/{1.73_m2} (ref >59)
GLUCOSE: 105 mg/dL — AB (ref 65–99)
POTASSIUM: 4.6 mmol/L (ref 3.5–5.2)
Sodium: 140 mmol/L (ref 134–144)

## 2016-11-13 LAB — LIPID PANEL
CHOLESTEROL TOTAL: 209 mg/dL — AB (ref 100–199)
Chol/HDL Ratio: 6.7 ratio units — ABNORMAL HIGH (ref 0.0–4.4)
HDL: 31 mg/dL — ABNORMAL LOW (ref >39)
LDL Calculated: 134 mg/dL — ABNORMAL HIGH (ref 0–99)
Triglycerides: 221 mg/dL — ABNORMAL HIGH (ref 0–149)
VLDL CHOLESTEROL CAL: 44 mg/dL — AB (ref 5–40)

## 2016-11-13 MED ORDER — ATORVASTATIN CALCIUM 10 MG PO TABS: ORAL_TABLET | ORAL | 4 refills | Status: DC

## 2016-11-13 NOTE — Addendum Note (Signed)
Addended by: Ofilia Neas R on: 11/13/2016 04:25 PM   Modules accepted: Orders

## 2017-01-26 ENCOUNTER — Other Ambulatory Visit: Payer: Self-pay | Admitting: Family Medicine

## 2017-01-27 NOTE — Telephone Encounter (Signed)
May have a prescription as requested, pharmacy to make sure patient knows that if medication causes suicidal ideation stop medicine seek attention with provider right away

## 2017-04-08 ENCOUNTER — Other Ambulatory Visit: Payer: Self-pay | Admitting: Family Medicine

## 2017-05-05 ENCOUNTER — Other Ambulatory Visit: Payer: Self-pay | Admitting: Family Medicine

## 2017-05-27 ENCOUNTER — Telehealth: Payer: Self-pay | Admitting: Family Medicine

## 2017-05-27 DIAGNOSIS — Z79899 Other long term (current) drug therapy: Secondary | ICD-10-CM

## 2017-05-27 DIAGNOSIS — E785 Hyperlipidemia, unspecified: Secondary | ICD-10-CM

## 2017-05-27 DIAGNOSIS — R739 Hyperglycemia, unspecified: Secondary | ICD-10-CM

## 2017-05-27 NOTE — Telephone Encounter (Signed)
Patient said she had labs done back in November and her cholesterol was high and needed repeat labs in 3 mos.  She didn't get them done but wants to go to [facility] this week.  Needs an order. Patient is at work so she said just leave her a message when order is in the sytem.

## 2017-05-27 NOTE — Telephone Encounter (Signed)
Lipid, liver, glucose-hyperlipidemia, fasting hyperglycemia

## 2017-05-27 NOTE — Telephone Encounter (Signed)
bloodwork orders put in. Pt notified on voicemail.

## 2017-05-27 NOTE — Telephone Encounter (Signed)
Patient had Lipid, Liver and Met 7 10/2016

## 2017-05-31 ENCOUNTER — Encounter: Payer: Self-pay | Admitting: Family Medicine

## 2017-05-31 LAB — GLUCOSE, RANDOM: GLUCOSE: 110 mg/dL — AB (ref 65–99)

## 2017-05-31 LAB — LIPID PANEL
Chol/HDL Ratio: 5.4 ratio — ABNORMAL HIGH (ref 0.0–4.4)
Cholesterol, Total: 147 mg/dL (ref 100–199)
HDL: 27 mg/dL — AB (ref >39)
LDL Calculated: 71 mg/dL (ref 0–99)
TRIGLYCERIDES: 245 mg/dL — AB (ref 0–149)
VLDL Cholesterol Cal: 49 mg/dL — ABNORMAL HIGH (ref 5–40)

## 2017-05-31 LAB — HEPATIC FUNCTION PANEL
ALK PHOS: 98 IU/L (ref 39–117)
ALT: 25 IU/L (ref 0–32)
AST: 22 IU/L (ref 0–40)
Albumin: 4.3 g/dL (ref 3.5–5.5)
BILIRUBIN, DIRECT: 0.06 mg/dL (ref 0.00–0.40)
Bilirubin Total: 0.2 mg/dL (ref 0.0–1.2)
Total Protein: 7.1 g/dL (ref 6.0–8.5)

## 2017-06-08 ENCOUNTER — Other Ambulatory Visit: Payer: Self-pay | Admitting: Family Medicine

## 2017-06-23 ENCOUNTER — Encounter: Payer: Self-pay | Admitting: Family Medicine

## 2017-06-23 ENCOUNTER — Ambulatory Visit (INDEPENDENT_AMBULATORY_CARE_PROVIDER_SITE_OTHER): Payer: 59 | Admitting: Family Medicine

## 2017-06-23 VITALS — BP 124/86 | Ht 59.0 in | Wt 163.0 lb

## 2017-06-23 DIAGNOSIS — I10 Essential (primary) hypertension: Secondary | ICD-10-CM | POA: Diagnosis not present

## 2017-06-23 DIAGNOSIS — E7849 Other hyperlipidemia: Secondary | ICD-10-CM

## 2017-06-23 DIAGNOSIS — R7301 Impaired fasting glucose: Secondary | ICD-10-CM

## 2017-06-23 DIAGNOSIS — G5692 Unspecified mononeuropathy of left upper limb: Secondary | ICD-10-CM

## 2017-06-23 DIAGNOSIS — R7303 Prediabetes: Secondary | ICD-10-CM | POA: Diagnosis not present

## 2017-06-23 DIAGNOSIS — E784 Other hyperlipidemia: Secondary | ICD-10-CM

## 2017-06-23 LAB — POCT GLYCOSYLATED HEMOGLOBIN (HGB A1C): Hemoglobin A1C: 5.9

## 2017-06-23 MED ORDER — VARENICLINE TARTRATE 1 MG PO TABS: 1.0000 mg | ORAL_TABLET | Freq: Two times a day (BID) | ORAL | 0 refills | Status: DC

## 2017-06-23 MED ORDER — VARENICLINE TARTRATE 0.5 MG X 11 & 1 MG X 42 PO MISC: ORAL | 0 refills | Status: DC

## 2017-06-23 MED ORDER — ATORVASTATIN CALCIUM 10 MG PO TABS: 10.0000 mg | ORAL_TABLET | Freq: Every day | ORAL | 1 refills | Status: DC

## 2017-06-23 MED ORDER — DICLOFENAC SODIUM 75 MG PO TBEC: 75.0000 mg | DELAYED_RELEASE_TABLET | Freq: Two times a day (BID) | ORAL | 6 refills | Status: DC

## 2017-06-23 MED ORDER — LISINOPRIL 5 MG PO TABS: 5.0000 mg | ORAL_TABLET | Freq: Every day | ORAL | 1 refills | Status: DC

## 2017-06-23 NOTE — Progress Notes (Signed)
   Subjective:    Patient ID: Elizabeth Graves, female    DOB: 1966-12-26, 51 y.o.   MRN: [id number]  HPIFollow up bloodwork results.  Patient states she does try to eat somewhat healthy. She works a lot of hours. Unable to do a lot of exercise but doing some. She is smoking she know she needs to quit She denies any chest tightness pressure pain shortness breath She does state she's taking her cholesterol medicine tolerating it well Takes her blood pressure medicine regular basis as well Elevated blood sugar on bloodwork. Pt was fasting. A1C today 5.9.   Left arm pain and numbness. Started over one year ago.  She relates pain discomfort in the back shoulder blade radiates into the tricep region occurs intermittently more so when she is working when she does any significant lifting pulling pushing she has pain she denies shortness of breath with it denies sweats chills vomiting She denies any weakness in the hands she uses a brace and she states it does a good job of getting the numbness to go away She does not wake up with numbness at night Wants to restart chantix. She is taken this before she denies any side effects denies being depressed she understands importance of quitting smoking the lessen her risk of cancer      Review of Systems  Constitutional: Negative for activity change, fatigue and fever.  Respiratory: Negative for cough and shortness of breath.   Cardiovascular: Negative for chest pain and leg swelling.  Neurological: Negative for headaches.       Objective:   Physical Exam  Constitutional: She appears well-nourished. No distress.  Cardiovascular: Normal rate, regular rhythm and normal heart sounds.   No murmur heard. Pulmonary/Chest: Effort normal and breath sounds normal. No respiratory distress.  Musculoskeletal: She exhibits no edema.  Lymphadenopathy:    She has no cervical adenopathy.  Neurological: She is alert. She exhibits normal muscle tone.    Psychiatric: Her behavior is normal.  Vitals reviewed.         Assessment & Plan:  Tobacco abuse-have discussed the need to quit-start Chantix-side effects discussed-if severe drains, severe nausea, depression, suicidal ideation immediately stop medication notify us.  HTN good control continue current measures watch diet closely continue current dietary measures  Hyperlipidemia previous labs review current labs reviewed continue current medication. Healthy diet, regular physical activity  Hand neuralgia-could be carpal tunnel wear braces at nighttime patient has a brace that she wears at work when it bothers her She understands that if this area gets worse we will be happy to refer her for nerve conduction study and point with hand specialist she would not want to do this at this time  It is possible that some of the pain in her shoulder in the triceps region could be an impingement of the nerve in her neck stretching exercises were shown try anti-inflammatories if it gets worse over time it will need some testing no weakness on today's exam  Prediabetes-importance of healthy diet regular physical activity and exercise discussed.  Female wellness exam recommended at least every 3 years

## 2017-07-20 ENCOUNTER — Other Ambulatory Visit: Payer: Self-pay | Admitting: Family Medicine

## 2017-07-21 NOTE — Telephone Encounter (Signed)
She may have refill I assume she are ready took the starter pack? If so she will need a maintenance medicine for 2 months maximum

## 2017-07-22 MED ORDER — VARENICLINE TARTRATE 1 MG PO TABS
1.0000 mg | ORAL_TABLET | Freq: Two times a day (BID) | ORAL | 0 refills | Status: DC
Start: 2017-07-22 — End: 2017-08-18

## 2017-08-02 ENCOUNTER — Other Ambulatory Visit: Payer: Self-pay | Admitting: Family Medicine

## 2017-08-18 ENCOUNTER — Other Ambulatory Visit: Payer: Self-pay | Admitting: Family Medicine

## 2017-08-19 NOTE — Telephone Encounter (Signed)
Last seen 06/23/17. May we refill Chantix

## 2017-08-19 NOTE — Telephone Encounter (Signed)
May refill 2, please put on the refill if depression or suicidal ideation immediately stop medication and contact provider

## 2017-09-04 ENCOUNTER — Other Ambulatory Visit: Payer: Self-pay | Admitting: Family Medicine

## 2017-10-02 ENCOUNTER — Other Ambulatory Visit: Payer: Self-pay

## 2017-10-02 MED ORDER — ALBUTEROL SULFATE HFA 108 (90 BASE) MCG/ACT IN AERS: 2.0000 | INHALATION_SPRAY | Freq: Four times a day (QID) | RESPIRATORY_TRACT | 0 refills | Status: DC | PRN

## 2017-10-31 ENCOUNTER — Other Ambulatory Visit: Payer: Self-pay | Admitting: Nurse Practitioner

## 2017-10-31 ENCOUNTER — Telehealth: Payer: Self-pay | Admitting: Family Medicine

## 2017-10-31 MED ORDER — ALPRAZOLAM 0.25 MG PO TABS: 0.2500 mg | ORAL_TABLET | Freq: Two times a day (BID) | ORAL | 0 refills | Status: DC | PRN

## 2017-10-31 NOTE — Telephone Encounter (Signed)
Xanax not on med list but it is under med history. Last prescribed 05/2017

## 2017-10-31 NOTE — Telephone Encounter (Signed)
Done

## 2017-10-31 NOTE — Telephone Encounter (Signed)
Requesting refill of Xanax to [facility].

## 2017-11-15 ENCOUNTER — Other Ambulatory Visit: Payer: Self-pay | Admitting: Family Medicine

## 2017-11-17 NOTE — Telephone Encounter (Signed)
Last seen 06/23/17 

## 2017-11-17 NOTE — Telephone Encounter (Signed)
It may be best to refuse this medication so therefore the patient calls in order for her to get proper instruction regarding the medicine

## 2017-11-29 ENCOUNTER — Other Ambulatory Visit: Payer: Self-pay | Admitting: Family Medicine

## 2017-12-12 ENCOUNTER — Other Ambulatory Visit: Payer: Self-pay | Admitting: Family Medicine

## 2018-01-02 ENCOUNTER — Encounter: Payer: Self-pay | Admitting: Family Medicine

## 2018-01-02 ENCOUNTER — Ambulatory Visit: Payer: 59 | Admitting: Family Medicine

## 2018-01-02 VITALS — BP 134/86 | Ht 59.0 in | Wt 169.0 lb

## 2018-01-02 DIAGNOSIS — E7849 Other hyperlipidemia: Secondary | ICD-10-CM

## 2018-01-02 DIAGNOSIS — Z79899 Other long term (current) drug therapy: Secondary | ICD-10-CM

## 2018-01-02 DIAGNOSIS — I10 Essential (primary) hypertension: Secondary | ICD-10-CM | POA: Diagnosis not present

## 2018-01-02 DIAGNOSIS — F320 Major depressive disorder, single episode, mild: Secondary | ICD-10-CM | POA: Diagnosis not present

## 2018-01-02 MED ORDER — LISINOPRIL 5 MG PO TABS: 5.0000 mg | ORAL_TABLET | Freq: Every day | ORAL | 5 refills | Status: DC

## 2018-01-02 MED ORDER — SERTRALINE HCL 50 MG PO TABS: 50.0000 mg | ORAL_TABLET | Freq: Every day | ORAL | 3 refills | Status: DC

## 2018-01-02 MED ORDER — ATORVASTATIN CALCIUM 10 MG PO TABS: 10.0000 mg | ORAL_TABLET | Freq: Every day | ORAL | 5 refills | Status: DC

## 2018-01-02 NOTE — Progress Notes (Signed)
Subjective:    Patient ID: Elizabeth Graves, female    DOB: May 07, 1966, 52 y.o.   MRN: [id number]  Hypertension  This is a chronic problem. Pertinent negatives include no chest pain, headaches or shortness of breath. Treatments tried: lisinopril. Compliance problems include exercise.    Pt states no concerns today.  Patient is moderately depressed from losing her mom a year ago having a lot of sadness crying spells denies being suicidal not homicidal feels like she has a hard time enjoying things we talked at length about this she is interested in trying medication does not want counseling currently Pt declined flu vaccine.   She has cholesterol issues she is trying to watch her diet try to lose some weight not exercising she understands the importance of exercise She does use Xanax rarely for anxiety denies drowsiness with it She does have blood pressure issues she takes her medicine on a regular basis for this She smokes occasionally she has tried Chantix in the past to help quit She also has hyperlipidemia issues she watches diet she does take her medicine for this Review of Systems  Constitutional: Negative for activity change, fatigue and fever.  HENT: Negative for congestion.   Respiratory: Negative for cough, chest tightness and shortness of breath.   Cardiovascular: Negative for chest pain and leg swelling.  Gastrointestinal: Negative for abdominal pain.  Skin: Negative for color change.  Neurological: Negative for headaches.  Psychiatric/Behavioral: Negative for behavioral problems.       Objective:   Physical Exam  Constitutional: She appears well-developed and well-nourished. No distress.  HENT:  Head: Normocephalic and atraumatic.  Eyes: Right eye exhibits no discharge. Left eye exhibits no discharge.  Neck: No tracheal deviation present.  Cardiovascular: Normal rate, regular rhythm and normal heart sounds.  No murmur heard. Pulmonary/Chest: Effort normal and breath  sounds normal. No respiratory distress. She has no wheezes. She has no rales.  Musculoskeletal: She exhibits no edema.  Lymphadenopathy:    She has no cervical adenopathy.  Neurological: She is alert. She exhibits normal muscle tone.  Skin: Skin is warm and dry. No erythema.  Psychiatric: Her behavior is normal.  Vitals reviewed.         Assessment & Plan:  HTN- Patient was seen today as part of a visit regarding hypertension. The importance of healthy diet and regular physical activity was discussed. The importance of compliance with medications discussed. Ideal goal is to keep blood pressure low elevated levels certainly below 902/40 when possible. The patient was counseled that keeping blood pressure under control lessen his risk of heart attack, stroke, kidney failure, and early death. The importance of regular follow-ups was discussed with the patient. Low-salt diet such as DASH recommended. Regular physical activity was recommended as well. Patient was advised to keep regular follow-ups.  The patient was seen today as part of an evaluation regarding hyperlipidemia. Recent lab work has been reviewed with the patient as well as the goals for good cholesterol care. In addition to this medications have been discussed the importance of compliance with diet and medications discussed as well. Patient has been informed of potential side effects of medications in the importance to notify us should any problems occur. Finally the patient is aware that poor control of cholesterol, noncompliance can dramatically increase her risk of heart attack strokes and premature death. The patient will keep regular office visits and the patient does agreed to periodic lab work.  Mild depression not suicidal will start  on medicine she agrees to give [place] update in 3-4 weeks how she is doing side effects were discussed with the patient if any significant side effects stop medicine if her depression gets worse or become  suicidal immediately seek help here or ER follow-up with [place] in several months  The importance of the patient exercising trying to lose weight staying away from smoking was discussed also the importance of keeping up with female health checkups and mammograms on a regular basis

## 2018-01-02 NOTE — Patient Instructions (Signed)
DASH Eating Plan DASH stands for "Dietary Approaches to Stop Hypertension." The DASH eating plan is a healthy eating plan that has been shown to reduce high blood pressure (hypertension). It may also reduce your risk for type 2 diabetes, heart disease, and stroke. The DASH eating plan may also help with weight loss. What are tips for following this plan? General guidelines  Avoid eating more than 2,300 mg (milligrams) of salt (sodium) a day. If you have hypertension, you may need to reduce your sodium intake to 1,500 mg a day.  Limit alcohol intake to no more than 1 drink a day for nonpregnant women and 2 drinks a day for men. One drink equals 12 oz of beer, 5 oz of wine, or 1 oz of hard liquor.  Work with your health care provider to maintain a healthy body weight or to lose weight. Ask what an ideal weight is for you.  Get at least 30 minutes of exercise that causes your heart to beat faster (aerobic exercise) most days of the week. Activities may include walking, swimming, or biking.  Work with your health care provider or diet and nutrition specialist (dietitian) to adjust your eating plan to your individual calorie needs. Reading food labels  Check food labels for the amount of sodium per serving. Choose foods with less than 5 percent of the Daily Value of sodium. Generally, foods with less than 300 mg of sodium per serving fit into this eating plan.  To find whole grains, look for the word "whole" as the first word in the ingredient list. Shopping  Buy products labeled as "low-sodium" or "no salt added."  Buy fresh foods. Avoid canned foods and premade or frozen meals. Cooking  Avoid adding salt when cooking. Use salt-free seasonings or herbs instead of table salt or sea salt. Check with your health care provider or pharmacist before using salt substitutes.  Do not fry foods. Cook foods using healthy methods such as baking, boiling, grilling, and broiling instead.  Cook with  heart-healthy oils, such as olive, canola, soybean, or sunflower oil. Meal planning   Eat a balanced diet that includes: ? 5 or more servings of fruits and vegetables each day. At each meal, try to fill half of your plate with fruits and vegetables. ? Up to 6-8 servings of whole grains each day. ? Less than 6 oz of lean meat, poultry, or fish each day. A 3-oz serving of meat is about the same size as a deck of cards. One egg equals 1 oz. ? 2 servings of low-fat dairy each day. ? A serving of nuts, seeds, or beans 5 times each week. ? Heart-healthy fats. Healthy fats called Omega-3 fatty acids are found in foods such as flaxseeds and coldwater fish, like sardines, salmon, and mackerel.  Limit how much you eat of the following: ? Canned or prepackaged foods. ? Food that is high in trans fat, such as fried foods. ? Food that is high in saturated fat, such as fatty meat. ? Sweets, desserts, sugary drinks, and other foods with added sugar. ? Full-fat dairy products.  Do not salt foods before eating.  Try to eat at least 2 vegetarian meals each week.  Eat more home-cooked food and less restaurant, buffet, and fast food.  When eating at a restaurant, ask that your food be prepared with less salt or no salt, if possible. What foods are recommended? The items listed may not be a complete list. Talk with your dietitian about what   dietary choices are best for you. Grains Whole-grain or whole-wheat bread. Whole-grain or whole-wheat pasta. Brown rice. Oatmeal. Quinoa. Bulgur. Whole-grain and low-sodium cereals. Pita bread. Low-fat, low-sodium crackers. Whole-wheat flour tortillas. Vegetables Fresh or frozen vegetables (raw, steamed, roasted, or grilled). Low-sodium or reduced-sodium tomato and vegetable juice. Low-sodium or reduced-sodium tomato sauce and tomato paste. Low-sodium or reduced-sodium canned vegetables. Fruits All fresh, dried, or frozen fruit. Canned fruit in natural juice (without  added sugar). Meat and other protein foods Skinless chicken or turkey. Ground chicken or turkey. Pork with fat trimmed off. Fish and seafood. Egg whites. Dried beans, peas, or lentils. Unsalted nuts, nut butters, and seeds. Unsalted canned beans. Lean cuts of beef with fat trimmed off. Low-sodium, lean deli meat. Dairy Low-fat (1%) or fat-free (skim) milk. Fat-free, low-fat, or reduced-fat cheeses. Nonfat, low-sodium ricotta or cottage cheese. Low-fat or nonfat yogurt. Low-fat, low-sodium cheese. Fats and oils Soft margarine without trans fats. Vegetable oil. Low-fat, reduced-fat, or light mayonnaise and salad dressings (reduced-sodium). Canola, safflower, olive, soybean, and sunflower oils. Avocado. Seasoning and other foods Herbs. Spices. Seasoning mixes without salt. Unsalted popcorn and pretzels. Fat-free sweets. What foods are not recommended? The items listed may not be a complete list. Talk with your dietitian about what dietary choices are best for you. Grains Baked goods made with fat, such as croissants, muffins, or some breads. Dry pasta or rice meal packs. Vegetables Creamed or fried vegetables. Vegetables in a cheese sauce. Regular canned vegetables (not low-sodium or reduced-sodium). Regular canned tomato sauce and paste (not low-sodium or reduced-sodium). Regular tomato and vegetable juice (not low-sodium or reduced-sodium). Pickles. Olives. Fruits Canned fruit in a light or heavy syrup. Fried fruit. Fruit in cream or butter sauce. Meat and other protein foods Fatty cuts of meat. Ribs. Fried meat. Bacon. Sausage. Bologna and other processed lunch meats. Salami. Fatback. Hotdogs. Bratwurst. Salted nuts and seeds. Canned beans with added salt. Canned or smoked fish. Whole eggs or egg yolks. Chicken or turkey with skin. Dairy Whole or 2% milk, cream, and half-and-half. Whole or full-fat cream cheese. Whole-fat or sweetened yogurt. Full-fat cheese. Nondairy creamers. Whipped toppings.  Processed cheese and cheese spreads. Fats and oils Butter. Stick margarine. Lard. Shortening. Ghee. Bacon fat. Tropical oils, such as coconut, palm kernel, or palm oil. Seasoning and other foods Salted popcorn and pretzels. Onion salt, garlic salt, seasoned salt, table salt, and sea salt. Worcestershire sauce. Tartar sauce. Barbecue sauce. Teriyaki sauce. Soy sauce, including reduced-sodium. Steak sauce. Canned and packaged gravies. Fish sauce. Oyster sauce. Cocktail sauce. Horseradish that you find on the shelf. Ketchup. Mustard. Meat flavorings and tenderizers. Bouillon cubes. Hot sauce and Tabasco sauce. Premade or packaged marinades. Premade or packaged taco seasonings. Relishes. Regular salad dressings. Where to find more information:  National Heart, Lung, and Blood Institute: www.nhlbi.nih.gov  American Heart Association: www.heart.org Summary  The DASH eating plan is a healthy eating plan that has been shown to reduce high blood pressure (hypertension). It may also reduce your risk for type 2 diabetes, heart disease, and stroke.  With the DASH eating plan, you should limit salt (sodium) intake to 2,300 mg a day. If you have hypertension, you may need to reduce your sodium intake to 1,500 mg a day.  When on the DASH eating plan, aim to eat more fresh fruits and vegetables, whole grains, lean proteins, low-fat dairy, and heart-healthy fats.  Work with your health care provider or diet and nutrition specialist (dietitian) to adjust your eating plan to your individual   calorie needs. This information is not intended to replace advice given to you by your health care provider. Make sure you discuss any questions you have with your health care provider. Document Released: 12/05/2011 Document Revised: 12/09/2016 Document Reviewed: 12/09/2016 Elsevier Interactive Patient Education  2018 [employer] Inc.  

## 2018-01-25 ENCOUNTER — Encounter: Payer: Self-pay | Admitting: Family Medicine

## 2018-02-11 ENCOUNTER — Ambulatory Visit (HOSPITAL_COMMUNITY): Payer: Self-pay

## 2018-02-11 ENCOUNTER — Other Ambulatory Visit: Payer: Self-pay | Admitting: Family Medicine

## 2018-02-11 ENCOUNTER — Other Ambulatory Visit: Payer: 59

## 2018-02-11 ENCOUNTER — Telehealth: Payer: Self-pay | Admitting: Family Medicine

## 2018-02-11 DIAGNOSIS — Z1231 Encounter for screening mammogram for malignant neoplasm of breast: Secondary | ICD-10-CM

## 2018-02-11 DIAGNOSIS — R928 Other abnormal and inconclusive findings on diagnostic imaging of breast: Secondary | ICD-10-CM

## 2018-02-11 NOTE — Telephone Encounter (Signed)
Last mammo  2011- see report - patient never followed up

## 2018-02-11 NOTE — Telephone Encounter (Signed)
Diagnostic mammo ordered in Epic. Patient notified.

## 2018-02-11 NOTE — Telephone Encounter (Signed)
Ok. So she needs screening of the left breast and diagnostic of the right. Recommend diagnostic mammogram of both.

## 2018-02-11 NOTE — Telephone Encounter (Signed)
Patient called Elizabeth Graves to schedule her mammogram that Dr. Nicki Reaper recommended she do.  She was unable to schedule because they told her she needed a diagnostic mammogram ordered by Dr. Nicki Reaper to follow up on a mammogram from 2011.

## 2018-02-12 LAB — LIPID PANEL
CHOLESTEROL TOTAL: 160 mg/dL (ref 100–199)
Chol/HDL Ratio: 5.9 ratio — ABNORMAL HIGH (ref 0.0–4.4)
HDL: 27 mg/dL — AB (ref >39)
LDL Calculated: 81 mg/dL (ref 0–99)
TRIGLYCERIDES: 259 mg/dL — AB (ref 0–149)
VLDL Cholesterol Cal: 52 mg/dL — ABNORMAL HIGH (ref 5–40)

## 2018-02-12 LAB — HEPATIC FUNCTION PANEL
ALT: 24 IU/L (ref 0–32)
AST: 18 IU/L (ref 0–40)
Albumin: 4.3 g/dL (ref 3.5–5.5)
Alkaline Phosphatase: 95 IU/L (ref 39–117)
BILIRUBIN TOTAL: 0.2 mg/dL (ref 0.0–1.2)
Bilirubin, Direct: 0.08 mg/dL (ref 0.00–0.40)
Total Protein: 7.1 g/dL (ref 6.0–8.5)

## 2018-02-12 LAB — BASIC METABOLIC PANEL
BUN/Creatinine Ratio: 14 (ref 9–23)
BUN: 12 mg/dL (ref 6–24)
CALCIUM: 9.4 mg/dL (ref 8.7–10.2)
CHLORIDE: 102 mmol/L (ref 96–106)
CO2: 21 mmol/L (ref 20–29)
Creatinine, Ser: 0.88 mg/dL (ref 0.57–1.00)
GFR calc non Af Amer: 77 mL/min/{1.73_m2} (ref >59)
GFR, EST AFRICAN AMERICAN: 89 mL/min/{1.73_m2} (ref >59)
GLUCOSE: 110 mg/dL — AB (ref 65–99)
POTASSIUM: 4.3 mmol/L (ref 3.5–5.2)
Sodium: 138 mmol/L (ref 134–144)

## 2018-02-17 ENCOUNTER — Other Ambulatory Visit: Payer: Self-pay | Admitting: *Deleted

## 2018-02-17 DIAGNOSIS — E785 Hyperlipidemia, unspecified: Secondary | ICD-10-CM

## 2018-02-17 DIAGNOSIS — Z79899 Other long term (current) drug therapy: Secondary | ICD-10-CM

## 2018-02-17 MED ORDER — ATORVASTATIN CALCIUM 20 MG PO TABS: 20.0000 mg | ORAL_TABLET | Freq: Every day | ORAL | 1 refills | Status: DC

## 2018-02-17 MED ORDER — SERTRALINE HCL 50 MG PO TABS: 50.0000 mg | ORAL_TABLET | Freq: Every day | ORAL | 0 refills | Status: DC

## 2018-03-13 ENCOUNTER — Telehealth: Payer: Self-pay | Admitting: Nurse Practitioner

## 2018-03-13 ENCOUNTER — Other Ambulatory Visit: Payer: Self-pay | Admitting: Nurse Practitioner

## 2018-03-13 DIAGNOSIS — N6310 Unspecified lump in the right breast, unspecified quadrant: Secondary | ICD-10-CM

## 2018-03-13 NOTE — Telephone Encounter (Signed)
Bethena Roys from [facility] called and stated that since pt did not follow up from 2011 her dx would need to say follow up right breast mass and it would need to be a diagnostic mammogram. Also, Bethena Roys said that there would need to be an order for a right and left limited ultrasound of the breast with the same dx.

## 2018-03-13 NOTE — Telephone Encounter (Signed)
Please place orders and I will sign. Thanks.

## 2018-03-13 NOTE — Progress Notes (Unsigned)
m °

## 2018-03-13 NOTE — Telephone Encounter (Signed)
Orders placed in epic

## 2018-03-16 ENCOUNTER — Other Ambulatory Visit: Payer: Self-pay | Admitting: *Deleted

## 2018-03-16 DIAGNOSIS — N6310 Unspecified lump in the right breast, unspecified quadrant: Secondary | ICD-10-CM

## 2018-03-24 ENCOUNTER — Ambulatory Visit (HOSPITAL_COMMUNITY)
Admission: RE | Admit: 2018-03-24 | Discharge: 2018-03-24 | Disposition: A | Discharge status: Home / Self Care | Payer: 59 | Source: Ambulatory Visit | Attending: Nurse Practitioner | Admitting: Nurse Practitioner

## 2018-03-24 ENCOUNTER — Encounter (HOSPITAL_COMMUNITY): Payer: Self-pay

## 2018-03-24 DIAGNOSIS — N6310 Unspecified lump in the right breast, unspecified quadrant: Secondary | ICD-10-CM

## 2018-03-24 DIAGNOSIS — R928 Other abnormal and inconclusive findings on diagnostic imaging of breast: Secondary | ICD-10-CM | POA: Diagnosis not present

## 2018-04-30 ENCOUNTER — Ambulatory Visit: Payer: BLUE CROSS/BLUE SHIELD | Admitting: Family Medicine

## 2018-04-30 ENCOUNTER — Encounter: Payer: Self-pay | Admitting: Family Medicine

## 2018-04-30 VITALS — BP 116/74 | Ht 59.0 in | Wt 165.6 lb

## 2018-04-30 DIAGNOSIS — E785 Hyperlipidemia, unspecified: Secondary | ICD-10-CM

## 2018-04-30 DIAGNOSIS — I10 Essential (primary) hypertension: Secondary | ICD-10-CM

## 2018-04-30 MED ORDER — SERTRALINE HCL 50 MG PO TABS: 50.0000 mg | ORAL_TABLET | Freq: Every day | ORAL | 3 refills | Status: DC

## 2018-04-30 MED ORDER — ALBUTEROL SULFATE HFA 108 (90 BASE) MCG/ACT IN AERS: 2.0000 | INHALATION_SPRAY | Freq: Four times a day (QID) | RESPIRATORY_TRACT | 5 refills | Status: DC | PRN

## 2018-04-30 MED ORDER — RANITIDINE HCL 150 MG PO CAPS: 150.0000 mg | ORAL_CAPSULE | Freq: Every day | ORAL | 6 refills | Status: DC

## 2018-04-30 MED ORDER — LISINOPRIL 5 MG PO TABS: 5.0000 mg | ORAL_TABLET | Freq: Every day | ORAL | 1 refills | Status: DC

## 2018-04-30 MED ORDER — ATORVASTATIN CALCIUM 20 MG PO TABS
20.0000 mg | ORAL_TABLET | Freq: Every day | ORAL | 1 refills | Status: DC
Start: 2018-04-30 — End: 2018-08-18

## 2018-04-30 NOTE — Progress Notes (Signed)
Subjective:    Patient ID: Elizabeth Graves, female    DOB: October 12, 1966, 52 y.o.   MRN: [id number]  Depression         This is a chronic problem.  The current episode started more than 1 month ago.   Associated symptoms include no fatigue and no headaches.  Compliance with treatment is good.  Pt here for med check. Pt on anti depressants.   is patient is here today regarding follow-up regarding depression.  Patient relates compliance with medication.  Patient understands importance of taking the medication.  Patient denies any significant side effects.  Patient relates that the medication is still beneficial for them and they would like to continue the medication.  Patient is not having any threats of homicide or suicide.  Patient does use tobacco products.  Patient knows they should quit.  Patient is aware that smoking/in use of tobacco products increases their risk of heart disease, cancer, and COPD- lung issues.  Patient has been counseled to quit smoking/tobacco products.  Patient does use Xanax very sparingly  Patient here for follow-up regarding cholesterol.  Patient does try to maintain a reasonable diet.  Patient does take the medication on a regular basis.  Denies missing a dose.  The patient denies any obvious side effects.  Prior blood work results reviewed with the patient.  The patient is aware of his cholesterol goals and the need to keep it under good control to lessen the risk of disease.  Patient for blood pressure check up. Patient relates compliance with meds. Todays BP reviewed with the patient. Patient denies issues with medication. Patient relates reasonable diet. Patient tries to minimize salt. Patient aware of BP goals.   Review of Systems  Constitutional: Negative for activity change, fatigue and fever.  HENT: Negative for congestion.   Respiratory: Negative for cough, chest tightness and shortness of breath.   Cardiovascular: Negative for chest pain and leg swelling.    Gastrointestinal: Negative for abdominal pain.  Skin: Negative for color change.  Neurological: Negative for headaches.  Psychiatric/Behavioral: Positive for depression. Negative for behavioral problems.       Objective:   Physical Exam  Constitutional: She appears well-nourished. No distress.  Cardiovascular: Normal rate, regular rhythm and normal heart sounds.  No murmur heard. Pulmonary/Chest: Effort normal and breath sounds normal. No respiratory distress.  Musculoskeletal: She exhibits no edema.  Lymphadenopathy:    She has no cervical adenopathy.  Neurological: She is alert. She exhibits normal muscle tone.  Psychiatric: Her behavior is normal.  Vitals reviewed.         Assessment & Plan:  HTN- Patient was seen today as part of a visit regarding hypertension. The importance of healthy diet and regular physical activity was discussed. The importance of compliance with medications discussed.  Ideal goal is to keep blood pressure low elevated levels certainly below 765/46 when possible.  The patient was counseled that keeping blood pressure under control lessen his risk of heart attack, stroke, kidney failure, and early death.  The importance of regular follow-ups was discussed with the patient.  Low-salt diet such as DASH recommended. Regular physical activity was recommended as well.  Patient was advised to keep regular follow-ups.  The patient was seen today as part of an evaluation regarding hyperlipidemia.  Recent lab work has been reviewed with the patient as well as the goals for good cholesterol care.  In addition to this medications have been discussed the importance of compliance with diet  and medications discussed as well.  Finally the patient is aware that poor control of cholesterol, noncompliance can dramatically increase her risk of heart attack strokes and premature death.  The patient will keep regular office visits and the patient does agreed to periodic  lab work.  Patient was counseled to try to quit smoking if possible  Anxiety/depression doing well with medication continue current medication  Follow-up 6 months

## 2018-06-26 ENCOUNTER — Other Ambulatory Visit: Payer: BLUE CROSS/BLUE SHIELD

## 2018-06-27 LAB — HEPATIC FUNCTION PANEL
ALT: 20 IU/L (ref 0–32)
AST: 18 IU/L (ref 0–40)
Albumin: 4.3 g/dL (ref 3.5–5.5)
Alkaline Phosphatase: 95 IU/L (ref 39–117)
BILIRUBIN TOTAL: 0.3 mg/dL (ref 0.0–1.2)
Bilirubin, Direct: 0.08 mg/dL (ref 0.00–0.40)
TOTAL PROTEIN: 6.7 g/dL (ref 6.0–8.5)

## 2018-06-27 LAB — LIPID PANEL
CHOL/HDL RATIO: 6.4 ratio — AB (ref 0.0–4.4)
Cholesterol, Total: 148 mg/dL (ref 100–199)
HDL: 23 mg/dL — ABNORMAL LOW (ref >39)
LDL CALC: 82 mg/dL (ref 0–99)
Triglycerides: 214 mg/dL — ABNORMAL HIGH (ref 0–149)
VLDL CHOLESTEROL CAL: 43 mg/dL — AB (ref 5–40)

## 2018-07-01 ENCOUNTER — Other Ambulatory Visit: Payer: Self-pay | Admitting: Family Medicine

## 2018-07-01 DIAGNOSIS — Z79899 Other long term (current) drug therapy: Secondary | ICD-10-CM

## 2018-07-01 DIAGNOSIS — E785 Hyperlipidemia, unspecified: Secondary | ICD-10-CM

## 2018-07-01 DIAGNOSIS — I10 Essential (primary) hypertension: Secondary | ICD-10-CM

## 2018-08-18 ENCOUNTER — Other Ambulatory Visit: Payer: Self-pay | Admitting: *Deleted

## 2018-08-18 MED ORDER — ATORVASTATIN CALCIUM 20 MG PO TABS: 20.0000 mg | ORAL_TABLET | Freq: Every day | ORAL | 1 refills | Status: DC

## 2018-12-02 ENCOUNTER — Other Ambulatory Visit: Payer: Self-pay | Admitting: Family Medicine

## 2019-01-04 ENCOUNTER — Other Ambulatory Visit: Payer: Self-pay | Admitting: Family Medicine

## 2019-01-29 ENCOUNTER — Other Ambulatory Visit: Payer: Self-pay | Admitting: Family Medicine

## 2019-03-09 ENCOUNTER — Other Ambulatory Visit: Payer: Self-pay | Admitting: Family Medicine

## 2019-03-13 ENCOUNTER — Encounter: Payer: Self-pay | Admitting: Family Medicine

## 2019-03-15 ENCOUNTER — Telehealth: Payer: Self-pay | Admitting: Family Medicine

## 2019-03-15 ENCOUNTER — Other Ambulatory Visit: Payer: Self-pay | Admitting: *Deleted

## 2019-03-15 MED ORDER — LISINOPRIL 5 MG PO TABS: 5.0000 mg | ORAL_TABLET | Freq: Every day | ORAL | 1 refills | Status: DC

## 2019-03-15 NOTE — Telephone Encounter (Signed)
Left message to return call 

## 2019-03-15 NOTE — Telephone Encounter (Signed)
Patient notified

## 2019-03-15 NOTE — Telephone Encounter (Signed)
May have a 90-day refill with 1 additional refill Please let the patient know that when current healthcare situation settles down we would recommend a follow-up office visit this summer

## 2019-03-15 NOTE — Telephone Encounter (Signed)
lisinopril (PRINIVIL,ZESTRIL) 5 MG tablet   Patient states needing 90 day supply for insurance purposes.   Pharmacy:  [id number] [place], [place]

## 2019-04-28 ENCOUNTER — Other Ambulatory Visit: Payer: Self-pay | Admitting: Family Medicine

## 2019-04-28 NOTE — Telephone Encounter (Signed)
May have 1 refill Needs medication checkup virtual

## 2019-04-29 ENCOUNTER — Telehealth: Payer: Self-pay | Admitting: Family Medicine

## 2019-04-29 NOTE — Telephone Encounter (Signed)
Per Dr. Nicki Reaper patient needs to schedule a med check sometime in the next 30 days.  I called and left patient a voicemail to call us back to schedule a phone appt at her convenience.

## 2019-05-12 ENCOUNTER — Other Ambulatory Visit: Payer: Self-pay

## 2019-05-12 ENCOUNTER — Ambulatory Visit (INDEPENDENT_AMBULATORY_CARE_PROVIDER_SITE_OTHER): Payer: BLUE CROSS/BLUE SHIELD | Admitting: Family Medicine

## 2019-05-12 DIAGNOSIS — E785 Hyperlipidemia, unspecified: Secondary | ICD-10-CM

## 2019-05-12 DIAGNOSIS — I10 Essential (primary) hypertension: Secondary | ICD-10-CM

## 2019-05-12 MED ORDER — ATORVASTATIN CALCIUM 20 MG PO TABS: 20.0000 mg | ORAL_TABLET | Freq: Every day | ORAL | 1 refills | Status: DC

## 2019-05-12 MED ORDER — LISINOPRIL 5 MG PO TABS: 5.0000 mg | ORAL_TABLET | Freq: Every day | ORAL | 1 refills | Status: DC

## 2019-05-12 MED ORDER — SERTRALINE HCL 50 MG PO TABS: 50.0000 mg | ORAL_TABLET | Freq: Every day | ORAL | 3 refills | Status: DC

## 2019-05-12 NOTE — Progress Notes (Signed)
Subjective:    Patient ID: Elizabeth Graves, female    DOB: 1966-05-02, 53 y.o.   MRN: [id number]  Hyperlipidemia  This is a chronic problem. Pertinent negatives include no chest pain or shortness of breath. Treatments tried: atorvastatin. Compliance problems: takes meds every day, a lot of walking every day, eats healthy.     Patient for blood pressure check up.  The patient does have hypertension.  The patient is on medication.  Patient relates compliance with meds. Todays BP reviewed with the patient. Patient denies issues with medication. Patient relates reasonable diet. Patient tries to minimize salt. Patient aware of BP goals.  Patient try to do a good job with healthy eating regular physical activity avoiding coronavirus  Patient states moods overall are stable doing and taking her medicine denies being depressed Virtual Visit via Telephone Note  I connected with Elizabeth Graves on 05/12/19 at 11:30 AM EDT by telephone and verified that I am speaking with the correct person using two identifiers.  Location: Patient: home Provider: office   I discussed the limitations, risks, security and privacy concerns of performing an evaluation and management service by telephone and the availability of in person appointments. I also discussed with the patient that there may be a patient responsible charge related to this service. The patient expressed understanding and agreed to proceed.   History of Present Illness:    Observations/Objective:   Assessment and Plan:   Follow Up Instructions:    I discussed the assessment and treatment plan with the patient. The patient was provided an opportunity to ask questions and all were answered. The patient agreed with the plan and demonstrated an understanding of the instructions.   The patient was advised to call back or seek an in-person evaluation if the symptoms worsen or if the condition fails to improve as anticipated.  I provided 15  minutes of non-face-to-face time during this encounter.      Review of Systems  Constitutional: Negative for activity change, fatigue and fever.  HENT: Negative for congestion and rhinorrhea.   Respiratory: Negative for cough, chest tightness and shortness of breath.   Cardiovascular: Negative for chest pain and leg swelling.  Gastrointestinal: Negative for abdominal pain and nausea.  Skin: Negative for color change.  Neurological: Negative for dizziness and headaches.  Psychiatric/Behavioral: Negative for agitation and behavioral problems.       Objective:   Physical Exam   Patient had virtual visit Appears to be in no distress Atraumatic Neuro able to relate and oriented No apparent resp distress Color normal      Assessment & Plan:  HTN- Patient was seen today as part of a visit regarding hypertension. The importance of healthy diet and regular physical activity was discussed. The importance of compliance with medications discussed.  Ideal goal is to keep blood pressure low elevated levels certainly below 086/57 when possible.  The patient was counseled that keeping blood pressure under control lessen his risk of complications.  The importance of regular follow-ups was discussed with the patient.  Low-salt diet such as DASH recommended.  Regular physical activity was recommended as well.  Patient was advised to keep regular follow-ups.  The patient was seen today as part of an evaluation regarding hyperlipidemia.  Recent lab work has been reviewed with the patient as well as the goals for good cholesterol care.  In addition to this medications have been discussed the importance of compliance with diet and medications discussed as well.  Finally the patient is aware that poor control of cholesterol, noncompliance can dramatically increase the risk of complications. The patient will keep regular office visits and the patient does agreed to periodic lab work.

## 2019-05-13 NOTE — Progress Notes (Signed)
Blood work ordered in [employer] [facility]. Patient aware to do over the summer.

## 2019-05-13 NOTE — Addendum Note (Signed)
Addended by: Dairl Ponder on: 05/13/2019 03:05 PM   Modules accepted: Orders

## 2019-05-20 ENCOUNTER — Other Ambulatory Visit (HOSPITAL_COMMUNITY): Payer: Self-pay | Admitting: Family Medicine

## 2019-05-20 DIAGNOSIS — Z1231 Encounter for screening mammogram for malignant neoplasm of breast: Secondary | ICD-10-CM

## 2019-12-10 ENCOUNTER — Other Ambulatory Visit: Payer: Self-pay | Admitting: Family Medicine

## 2019-12-10 NOTE — Telephone Encounter (Signed)
Please contact patient to set up appt; then may route back to nurses. Thank you  

## 2019-12-13 NOTE — Telephone Encounter (Signed)
Left message

## 2019-12-13 NOTE — Telephone Encounter (Signed)
Scheduled 1/4 virtual

## 2020-01-03 ENCOUNTER — Encounter: Payer: Self-pay | Admitting: Family Medicine

## 2020-01-03 ENCOUNTER — Ambulatory Visit (INDEPENDENT_AMBULATORY_CARE_PROVIDER_SITE_OTHER): Payer: BC Managed Care – PPO | Admitting: Family Medicine

## 2020-01-03 ENCOUNTER — Other Ambulatory Visit: Payer: Self-pay

## 2020-01-03 DIAGNOSIS — E785 Hyperlipidemia, unspecified: Secondary | ICD-10-CM | POA: Diagnosis not present

## 2020-01-03 DIAGNOSIS — I10 Essential (primary) hypertension: Secondary | ICD-10-CM

## 2020-01-03 DIAGNOSIS — J301 Allergic rhinitis due to pollen: Secondary | ICD-10-CM

## 2020-01-03 DIAGNOSIS — F419 Anxiety disorder, unspecified: Secondary | ICD-10-CM | POA: Diagnosis not present

## 2020-01-03 DIAGNOSIS — Z1211 Encounter for screening for malignant neoplasm of colon: Secondary | ICD-10-CM

## 2020-01-03 MED ORDER — ATORVASTATIN CALCIUM 20 MG PO TABS: 20.0000 mg | ORAL_TABLET | Freq: Every day | ORAL | 1 refills | Status: DC

## 2020-01-03 MED ORDER — LISINOPRIL 5 MG PO TABS: 5.0000 mg | ORAL_TABLET | Freq: Every day | ORAL | 1 refills | Status: DC

## 2020-01-03 NOTE — Progress Notes (Signed)
   Subjective:    Patient ID: Elizabeth Graves, female    DOB: Apr 30, 1966, 54 y.o.   MRN: [id number]  Hypertension This is a chronic problem. The current episode started more than 1 year ago. Pertinent negatives include no chest pain or shortness of breath. Risk factors for coronary artery disease include post-menopausal state. Treatments tried: lisinopril. There are no compliance problems.   Watches salt in the diet takes her medication on a regular basis Patient states her blood pressures have been running overall good when she checks it. Seasonal allergic rhinitis due to pollen  HTN (hypertension), benign - Plan: Hepatic function panel, Basic Metabolic Panel (BMET)  Mild anxiety  Hyperlipidemia, unspecified hyperlipidemia type - Plan: Lipid Profile  Encounter for screening colonoscopy - Plan: Ambulatory referral to Gastroenterology  Mild anxiety takes sertraline on a regular basis moods overall doing well but sometimes has difficult time around Christmas time denies being depressed currently.  Takes her cholesterol medicine regular basis watches diet closely.  Denies any side effects with the medication.  Patient also needs screening colonoscopy Virtual Visit via Video Note  I connected with Charmell Araiza Aylesworth on 01/03/20 at  8:30 AM EST by a video enabled telemedicine application and verified that I am speaking with the correct person using two identifiers.  Location: Patient: home Provider: office   I discussed the limitations of evaluation and management by telemedicine and the availability of in person appointments. The patient expressed understanding and agreed to proceed.  History of Present Illness:    Observations/Objective:   Assessment and Plan:   Follow Up Instructions:    I discussed the assessment and treatment plan with the patient. The patient was provided an opportunity to ask questions and all were answered. The patient agreed with the plan and demonstrated  an understanding of the instructions.   The patient was advised to call back or seek an in-person evaluation if the symptoms worsen or if the condition fails to improve as anticipated.  I provided 17 minutes of non-face-to-face time during this encounter.         Review of Systems  Constitutional: Negative for activity change, appetite change and fatigue.  HENT: Negative for congestion and rhinorrhea.   Respiratory: Negative for cough and shortness of breath.   Cardiovascular: Negative for chest pain and leg swelling.  Gastrointestinal: Negative for abdominal pain and diarrhea.  Endocrine: Negative for polydipsia and polyphagia.  Skin: Negative for color change.  Neurological: Negative for dizziness and weakness.  Psychiatric/Behavioral: Negative for behavioral problems and confusion.       Objective:   Physical Exam  Today's visit was via telephone Physical exam was not possible for this visit       Assessment & Plan:  1. Seasonal allergic rhinitis due to pollen Use OTC allergy tablet May use Flonase as needed  2. HTN (hypertension), benign Blood pressure decent control takes her medicine regular basis minimizes salt - Hepatic function panel - Basic Metabolic Panel (BMET)  3. Mild anxiety Anxiety doing well denies being depressed sertraline would like to continue with refills given  4. Hyperlipidemia, unspecified hyperlipidemia type 12-year-old - Lipid Profile  5. Encounter for screening colonoscopy Colonoscopy recommended patient states she will get this done somewhere between now and spring referral given - Ambulatory referral to Gastroenterology Female wellness recommended sometime in the next several months Lab work recommended Follow-up in 6 months

## 2020-01-06 ENCOUNTER — Encounter: Payer: Self-pay | Admitting: Family Medicine

## 2020-01-11 ENCOUNTER — Encounter: Payer: Self-pay | Admitting: Internal Medicine

## 2020-02-05 ENCOUNTER — Other Ambulatory Visit: Payer: Self-pay | Admitting: Family Medicine

## 2020-02-08 NOTE — Telephone Encounter (Signed)
90-day may have a virtual follow-up visit this spring

## 2020-03-07 LAB — HEPATIC FUNCTION PANEL
ALT: 29 IU/L (ref 0–32)
AST: 23 IU/L (ref 0–40)
Albumin: 4.4 g/dL (ref 3.8–4.9)
Alkaline Phosphatase: 99 IU/L (ref 39–117)
Bilirubin Total: 0.2 mg/dL (ref 0.0–1.2)
Bilirubin, Direct: 0.09 mg/dL (ref 0.00–0.40)
Total Protein: 7.1 g/dL (ref 6.0–8.5)

## 2020-03-07 LAB — BASIC METABOLIC PANEL
BUN/Creatinine Ratio: 16 (ref 9–23)
BUN: 16 mg/dL (ref 6–24)
CO2: 20 mmol/L (ref 20–29)
Calcium: 9.9 mg/dL (ref 8.7–10.2)
Chloride: 103 mmol/L (ref 96–106)
Creatinine, Ser: 0.98 mg/dL (ref 0.57–1.00)
GFR calc Af Amer: 77 mL/min/{1.73_m2} (ref >59)
GFR calc non Af Amer: 67 mL/min/{1.73_m2} (ref >59)
Glucose: 130 mg/dL — ABNORMAL HIGH (ref 65–99)
Potassium: 4.9 mmol/L (ref 3.5–5.2)
Sodium: 138 mmol/L (ref 134–144)

## 2020-03-07 LAB — LIPID PANEL
Chol/HDL Ratio: 4.9 ratio — ABNORMAL HIGH (ref 0.0–4.4)
Cholesterol, Total: 162 mg/dL (ref 100–199)
HDL: 33 mg/dL — ABNORMAL LOW (ref >39)
LDL Chol Calc (NIH): 96 mg/dL (ref 0–99)
Triglycerides: 191 mg/dL — ABNORMAL HIGH (ref 0–149)
VLDL Cholesterol Cal: 33 mg/dL (ref 5–40)

## 2020-04-10 ENCOUNTER — Ambulatory Visit: Payer: 59

## 2020-05-01 ENCOUNTER — Other Ambulatory Visit: Payer: Self-pay | Admitting: Family Medicine

## 2020-05-01 NOTE — Telephone Encounter (Signed)
Scheduled 6/1

## 2020-05-01 NOTE — Telephone Encounter (Signed)
Please contact patient to set up appt; then may route to nurses. Thank you

## 2020-05-01 NOTE — Telephone Encounter (Signed)
lvm to schedule med check

## 2020-05-30 ENCOUNTER — Encounter: Payer: Self-pay | Admitting: Family Medicine

## 2020-05-30 ENCOUNTER — Ambulatory Visit: Payer: BC Managed Care – PPO | Admitting: Family Medicine

## 2020-05-30 ENCOUNTER — Other Ambulatory Visit: Payer: Self-pay

## 2020-05-30 VITALS — BP 136/84 | Temp 97.8°F | Ht 59.0 in | Wt 165.0 lb

## 2020-05-30 DIAGNOSIS — Z1211 Encounter for screening for malignant neoplasm of colon: Secondary | ICD-10-CM

## 2020-05-30 DIAGNOSIS — E785 Hyperlipidemia, unspecified: Secondary | ICD-10-CM

## 2020-05-30 DIAGNOSIS — I10 Essential (primary) hypertension: Secondary | ICD-10-CM | POA: Diagnosis not present

## 2020-05-30 DIAGNOSIS — R7303 Prediabetes: Secondary | ICD-10-CM

## 2020-05-30 DIAGNOSIS — R739 Hyperglycemia, unspecified: Secondary | ICD-10-CM | POA: Diagnosis not present

## 2020-05-30 DIAGNOSIS — N6320 Unspecified lump in the left breast, unspecified quadrant: Secondary | ICD-10-CM

## 2020-05-30 LAB — POCT GLYCOSYLATED HEMOGLOBIN (HGB A1C): Hemoglobin A1C: 5.7 % — AB (ref 4.0–5.6)

## 2020-05-30 NOTE — Patient Instructions (Signed)

## 2020-05-30 NOTE — Progress Notes (Signed)
Subjective:    Patient ID: Elizabeth Graves, female    DOB: 30-Mar-1966, 54 y.o.   MRN: [id number]  Hyperlipidemia This is a chronic problem. Pertinent negatives include no chest pain or shortness of breath. Treatments tried: atorvastatin. Compliance problems: takes meds every day, walks for exercise, eats healthy.    Knot in left breast for a couple of months.  She states she noticed this over couple months ago seems to be getting bigger no pain with it no drainage no redness has never had this problem before.  No history of cancer.    Patient has had problems with her sugar being slightly elevated on recent lab work it was elevated therefore we checked an A1c A1c 5.7   Blood pressure decent control watches diet takes her medicine on a regular basis   Mild obesity she has been cautioned about portion control dietary changes to lessen the risk of diabetes   prediabetic.  Results for orders placed or performed in visit on 05/30/20  POCT glycosylated hemoglobin (Hb A1C)  Result Value Ref Range   Hemoglobin A1C 5.7 (A) 4.0 - 5.6 %   HbA1c POC (<> result, manual entry)     HbA1c, POC (prediabetic range)     HbA1c, POC (controlled diabetic range)        Review of Systems  Constitutional: Negative for activity change, fatigue and fever.  HENT: Negative for congestion and rhinorrhea.   Respiratory: Negative for cough, chest tightness and shortness of breath.   Cardiovascular: Negative for chest pain and leg swelling.  Gastrointestinal: Negative for abdominal pain and nausea.  Skin: Negative for color change.  Neurological: Negative for dizziness and headaches.  Psychiatric/Behavioral: Negative for agitation and behavioral problems.       Objective:   Physical Exam Vitals reviewed.  Constitutional:      General: She is not in acute distress. HENT:     Head: Normocephalic and atraumatic.  Eyes:     General:        Right eye: No discharge.        Left eye: No discharge.    Neck:     Trachea: No tracheal deviation.  Cardiovascular:     Rate and Rhythm: Normal rate and regular rhythm.     Heart sounds: Normal heart sounds. No murmur.  Pulmonary:     Effort: Pulmonary effort is normal. No respiratory distress.     Breath sounds: Normal breath sounds.  Lymphadenopathy:     Cervical: No cervical adenopathy.  Skin:    General: Skin is warm and dry.  Neurological:     Mental Status: She is alert.     Coordination: Coordination normal.  Psychiatric:        Behavior: Behavior normal.           Assessment & Plan:  1. Hyperglycemia Glucose moderately elevated - POCT glycosylated hemoglobin (Hb A1C)  2. Prediabetes Patient with prediabetes A1c shows prediabetes not diabetic we did discuss in a detailed fashion to help to avoid diabetes  3. HTN (hypertension), benign Blood pressure good control continue current measures  4. Hyperlipidemia, unspecified hyperlipidemia type Watch fats in diet continue current measures  5. Encounter for screening colonoscopy Patient agrees to go ahead with colonoscopy whenever GI can do it  6. Screen for colon cancer Referral - Ambulatory referral to Gastroenterology  7. Left breast mass I am concerned about this approximately 1.5 cm mass needs referral to general surgery for removal mammogram diagnostic indicated  concerning for the potential for cancer - MM DIAG BREAST TOMO BILATERAL - US BREAST LTD UNI LEFT INC AXILLA - US BREAST LTD UNI RIGHT INC AXILLA - Ambulatory referral to General Surgery

## 2020-06-12 ENCOUNTER — Encounter: Payer: Self-pay | Admitting: Family Medicine

## 2020-06-13 ENCOUNTER — Other Ambulatory Visit: Payer: Self-pay

## 2020-06-13 ENCOUNTER — Encounter: Payer: Self-pay | Admitting: Internal Medicine

## 2020-06-13 ENCOUNTER — Ambulatory Visit (HOSPITAL_COMMUNITY)
Admission: RE | Admit: 2020-06-13 | Discharge: 2020-06-13 | Disposition: A | Discharge status: Home / Self Care | Payer: BC Managed Care – PPO | Source: Ambulatory Visit | Attending: Family Medicine | Admitting: Family Medicine

## 2020-06-13 ENCOUNTER — Ambulatory Visit (HOSPITAL_COMMUNITY): Admission: RE | Admit: 2020-06-13 | Payer: BC Managed Care – PPO | Source: Ambulatory Visit

## 2020-06-13 ENCOUNTER — Other Ambulatory Visit (HOSPITAL_COMMUNITY): Payer: Self-pay | Admitting: Family Medicine

## 2020-06-13 DIAGNOSIS — N6320 Unspecified lump in the left breast, unspecified quadrant: Secondary | ICD-10-CM

## 2020-06-20 ENCOUNTER — Ambulatory Visit (HOSPITAL_COMMUNITY)
Admission: RE | Admit: 2020-06-20 | Discharge: 2020-06-20 | Disposition: A | Discharge status: Home / Self Care | Payer: BC Managed Care – PPO | Source: Ambulatory Visit | Attending: Family Medicine | Admitting: Family Medicine

## 2020-06-20 ENCOUNTER — Encounter (HOSPITAL_COMMUNITY): Payer: Self-pay

## 2020-06-20 ENCOUNTER — Other Ambulatory Visit: Payer: Self-pay

## 2020-06-20 ENCOUNTER — Other Ambulatory Visit (HOSPITAL_COMMUNITY): Payer: Self-pay | Admitting: Family Medicine

## 2020-06-20 DIAGNOSIS — R928 Other abnormal and inconclusive findings on diagnostic imaging of breast: Secondary | ICD-10-CM | POA: Diagnosis present

## 2020-06-20 DIAGNOSIS — N6320 Unspecified lump in the left breast, unspecified quadrant: Secondary | ICD-10-CM | POA: Diagnosis not present

## 2020-06-20 MED ORDER — LIDOCAINE HCL (PF) 1 % IJ SOLN
INTRAMUSCULAR | Status: AC
  Filled 2020-06-20: qty 10

## 2020-06-20 MED ORDER — LIDOCAINE-EPINEPHRINE (PF) 1 %-1:200000 IJ SOLN
INTRAMUSCULAR | Status: AC
  Filled 2020-06-20: qty 30

## 2020-06-26 LAB — SURGICAL PATHOLOGY

## 2020-06-29 ENCOUNTER — Ambulatory Visit: Payer: BC Managed Care – PPO | Admitting: General Surgery

## 2020-06-29 ENCOUNTER — Other Ambulatory Visit: Payer: Self-pay

## 2020-06-29 ENCOUNTER — Encounter: Payer: Self-pay | Admitting: General Surgery

## 2020-06-29 VITALS — BP 137/82 | HR 90 | Temp 97.0°F | Resp 18 | Ht <= 58 in | Wt 164.0 lb

## 2020-06-29 DIAGNOSIS — Z171 Estrogen receptor negative status [ER-]: Secondary | ICD-10-CM

## 2020-06-29 DIAGNOSIS — C50012 Malignant neoplasm of nipple and areola, left female breast: Secondary | ICD-10-CM | POA: Insufficient documentation

## 2020-06-29 NOTE — Patient Instructions (Signed)
Implanted Port Home Guide °An implanted port is a device that is placed under the skin. It is usually placed in the chest. The device can be used to give IV medicine, to take blood, or for dialysis. You may have an implanted port if: °· You need IV medicine that would be irritating to the small veins in your hands or arms. °· You need IV medicines, such as antibiotics, for a long period of time. °· You need IV nutrition for a long period of time. °· You need dialysis. °Having a port means that your health care provider will not need to use the veins in your arms for these procedures. You may have fewer limitations when using a port than you would if you used other types of long-term IVs, and you will likely be able to return to normal activities after your incision heals. °An implanted port has two main parts: °· Reservoir. The reservoir is the part where a needle is inserted to give medicines or draw blood. The reservoir is round. After it is placed, it appears as a small, raised area under your skin. °· Catheter. The catheter is a thin, flexible tube that connects the reservoir to a vein. Medicine that is inserted into the reservoir goes into the catheter and then into the vein. °How is my port accessed? °To access your port: °· A numbing cream may be placed on the skin over the port site. °· Your health care provider will put on a mask and sterile gloves. °· The skin over your port will be cleaned carefully with a germ-killing soap and allowed to dry. °· Your health care provider will gently pinch the port and insert a needle into it. °· Your health care provider will check for a blood return to make sure the port is in the vein and is not clogged. °· If your port needs to remain accessed to get medicine continuously (constant infusion), your health care provider will place a clear bandage (dressing) over the needle site. The dressing and needle will need to be changed every week, or as told by your health care  provider. °What is flushing? °Flushing helps keep the port from getting clogged. Follow instructions from your health care provider about how and when to flush the port. Ports are usually flushed with saline solution or a medicine called heparin. The need for flushing will depend on how the port is used: °· If the port is only used from time to time to give medicines or draw blood, the port may need to be flushed: °? Before and after medicines have been given. °? Before and after blood has been drawn. °? As part of routine maintenance. Flushing may be recommended every 4-6 weeks. °· If a constant infusion is running, the port may not need to be flushed. °· Throw away any syringes in a disposal container that is meant for sharp items (sharps container). You can buy a sharps container from a pharmacy, or you can make one by using an empty hard plastic bottle with a cover. °How long will my port stay implanted? °The port can stay in for as long as your health care provider thinks it is needed. When it is time for the port to come out, a surgery will be done to remove it. The surgery will be similar to the procedure that was done to put the port in. °Follow these instructions at home: ° °· Flush your port as told by your health care provider. °·   If you need an infusion over several days, follow instructions from your health care provider about how to take care of your port site. Make sure you: ? Wash your hands with soap and water before you change your dressing. If soap and water are not available, use alcohol-based hand sanitizer. ? Change your dressing as told by your health care provider. ? Place any used dressings or infusion bags into a plastic bag. Throw that bag in the trash. ? Keep the dressing that covers the needle clean and dry. Do not get it wet. ? Do not use scissors or sharp objects near the tube. ? Keep the tube clamped, unless it is being used.  Check your port site every day for signs of  infection. Check for: ? Redness, swelling, or pain. ? Fluid or blood. ? Pus or a bad smell.  Protect the skin around the port site. ? Avoid wearing bra straps that rub or irritate the site. ? Protect the skin around your port from seat belts. Place a soft pad over your chest if needed.  Bathe or shower as told by your health care provider. The site may get wet as long as you are not actively receiving an infusion.  Return to your normal activities as told by your health care provider. Ask your health care provider what activities are safe for you.  Carry a medical alert card or wear a medical alert bracelet at all times. This will let health care providers know that you have an implanted port in case of an emergency. Get help right away if:  You have redness, swelling, or pain at the port site.  You have fluid or blood coming from your port site.  You have pus or a bad smell coming from the port site.  You have a fever. Summary  Implanted ports are usually placed in the chest for long-term IV access.  Follow instructions from your health care provider about flushing the port and changing bandages (dressings).  Take care of the area around your port by avoiding clothing that puts pressure on the area, and by watching for signs of infection.  Protect the skin around your port from seat belts. Place a soft pad over your chest if needed.  Get help right away if you have a fever or you have redness, swelling, pain, drainage, or a bad smell at the port site. This information is not intended to replace advice given to you by your health care provider. Make sure you discuss any questions you have with your health care provider. Document Revised: 04/09/2019 Document Reviewed: 01/18/2017 Elsevier Patient Education  2020 [place], [place]  Breast cancer is a malignant growth of tissue (tumor) in the breast. Unlike noncancerous (benign) tumors, malignant tumors are  cancerous and can spread to other parts of the body. The two most common types of breast cancer start in the milk ducts (ductal carcinoma) or in the lobules where milk is made in the breast (lobular carcinoma). Breast cancer is one of the most common types of cancer in women. What are the causes? The exact cause of female breast cancer is unknown. What increases the risk? The following factors may make you more likely to develop this condition:  Being older than 52 years of age.  Race and ethnicity. Caucasian women generally have an increased risk, but African-American women are more likely to develop the disease before age 6.  Having a family history of breast cancer.  Having had  breast cancer in the past.  Having certain noncancerous conditions of the breast, such as dense breast tissue.  Having the BRCA1 and BRCA2 genes.  Having a history of radiation exposure.  Obesity.  Starting menopause after age 29.  Starting your menstrual periods before age 46.  Having never been pregnant or having your first child after age 22.  Having never breastfed.  Using hormone therapy after menopause.  Using birth control pills.  Drinking more than one alcoholic drink a day.  Exposure to the drug DES, which was given to pregnant women from the 1940s to the 1970s. What are the signs or symptoms? Symptoms of this condition include:  A painless lump or thickening in your breast.  Changes in the size or shape of your breast.  Breast skin changes, such as puckering or dimpling.  Nipple abnormalities, such as scaling, crustiness, redness, or pulling in (retraction).  Nipple discharge that is bloody or clear. How is this diagnosed? This condition may be diagnosed by:  Taking your medical history and doing a physical exam. During the exam, your health care provider will feel the tissue around your breast and under your arms.  Taking a sample of nipple discharge. The sample will be  examined under a microscope.  Performing imaging tests, such as breast X-rays (mammogram), breast ultrasound exams, or an MRI.  Taking a tissue sample (biopsy) from the breast. The sample will be examined under a microscope to look for cancer cells.  Taking a sample from the lymph nodes near the affected breast (sentinel node biopsy). Your cancer will be staged to determine its severity and extent. Staging is a careful attempt to find out the size of the tumor, whether the cancer has spread, and if so, to what parts of the body. Staging also includes testing your tumor for certain receptors, such as estrogen, progesterone, and human epidermal growth factor receptor 2 (HER2). This will help your cancer care team decide on a treatment that will work best for you. You may need to have more tests to determine the stage of your cancer. Stages include the following:  Stage 0--The tumor has not spread to other breast tissue.  Stage I--The cancer is only found in the breast or may be in the lymph nodes. The tumor may be up to  in (2 cm) wide.  Stage II--The cancer has spread to nearby lymph nodes. The tumor may be up to 2 in (5 cm) wide.  Stage III--The cancer has spread to more distant lymph nodes. The tumor may be larger than 2 in (5 cm) wide.  Stage IV--The cancer has spread to other parts of the body, such as the bones, brain, liver, or lungs. How is this treated? Treatment for this condition depends on the type and stage of the breast cancer. It may be treated with:  Surgery. This may involve breast-conserving surgery (lumpectomy or partial mastectomy) in which only the part of the breast containing the cancer is removed. Some normal tissue surrounding this area may also be removed. In some cases, surgery may be done to remove the entire breast (mastectomy) and nipple. Lymph nodes may also be removed.  Radiation therapy, which uses high-energy rays to kill cancer cells.  Chemotherapy, which is  the use of drugs to kill cancer cells.  Hormone therapy, which involves taking medicine to adjust the hormone levels in your body. You may take medicine to decrease your estrogen levels. This can help stop cancer cells from growing.  Targeted  therapy, in which drugs are used to block the growth and spread of cancer cells. These drugs target a specific part of the cancer cell and usually cause fewer side effects than chemotherapy. Targeted therapy may be used alone or in combination with chemotherapy.  A combination of surgery, radiation, chemotherapy, or hormone therapy may be needed to treat breast cancer. Follow these instructions at home:  Take over-the-counter and prescription medicines only as told by your health care provider.  Eat a healthy diet. A healthy diet includes lots of fruits and vegetables, low-fat dairy products, lean meats, and fiber. ? Make sure half your plate is filled with fruits or vegetables. ? Choose high-fiber foods such as whole-grain breads and cereals.  Consider joining a support group. This may help you learn to cope with the stress of having breast cancer.  Talk to your health care team about exercise and physical activity. The right exercise program can: ? Help prevent or reduce symptoms such as fatigue or depression. ? Improve overall health and survival rates.  Keep all follow-up visits as told by your health care provider. This is important. Where to find more information  American Cancer Society: www.cancer.[place]: www.cancer.gov Contact a health care provider if:  You have a sudden increase in pain.  You have any symptoms or changes that concern you.  You lose weight without trying.  You notice a new lump in either breast or under your arm.  You develop swelling in either arm or hand.  You have a fever.  You notice new fatigue or weakness. Get help right away if:  You have chest pain or trouble breathing.  You  faint. Summary  Breast cancer is a malignant growth of tissue (tumor) in the breast.  Your cancer will be staged to determine its severity and extent.  Treatment for this condition depends on the type and stage of the breast cancer. This information is not intended to replace advice given to you by your health care provider. Make sure you discuss any questions you have with your health care provider. Document Revised: 11/28/2017 Document Reviewed: 08/11/2017 Elsevier Patient Education  [place].

## 2020-06-29 NOTE — Progress Notes (Signed)
[facility] History and Physical  Reason for Referral: Left Breast Cancer  Referring Physician:  Dr. Luking      Chief Complaint    New Patient (Initial Visit)      Elizabeth Graves is a 53 y.o. female.  HPI: Elizabeth Graves is 53 yo who recently went to see Dr. Luking because she felt a mass for several months in her left breast. Her last mammogram was in 2019 and was benign. She denied any prior abnormal mammograms, any prior biopsies, any nipple discharge or changes, and was sent for mammogram by Dr. Luking.  Her imaging has revealed a mass and enlarged lymph nodes. Biopsies of the left breast mass and lymph nodes indicates Triple Negative Invasive Ductal Cancer.  She was sent to me from Dr. Luking prior to her imaging or her final diagnosis.   She has no family history of breast cancer. She has personal history of cervical cancer. She started her menses at age 13. She hs G6P2(4 miscarriages). She has had a hysterectomy for vaginal bleeding.        Past Medical History:  Diagnosis Date  . Cancer (HCC)    cervical - 20 yrs ago  . Hypertension   . Hypokalemia    tx w/ k-Dur  . Seasonal allergies    tx with benadryl prn  . SVD (spontaneous vaginal delivery)    x 2         Past Surgical History:  Procedure Laterality Date  . ABDOMINAL HYSTERECTOMY    . CO2 Laser of Cervix     for cervical cancer 20 yrs ago  . DILATION AND CURETTAGE OF UTERUS  12/2010   polyp removed  . DILATION AND CURETTAGE OF UTERUS     x 3 for MAB  . LAPAROSCOPIC ASSISTED VAGINAL HYSTERECTOMY  06/09/2012   Procedure: LAPAROSCOPIC ASSISTED VAGINAL HYSTERECTOMY;  Surgeon: Allan Ross, MD;  Location: [facility];  Service: Gynecology;  Laterality: N/A;  . SALPINGOOPHORECTOMY  06/09/2012   Procedure: SALPINGO OOPHERECTOMY;  Surgeon: Allan Ross, MD;  Location: [facility];  Service: Gynecology;  Laterality: Bilateral;  . TUBAL LIGATION           Family History   Problem Relation Age of Onset  . Cancer Mother        lung  . Heart disease Mother   . Heart disease Father     Social History        Tobacco Use  . Smoking status: Former Smoker    Packs/day: 1.00    Years: 16.00    Pack years: 16.00    Types: Cigarettes    Start date: 05/30/1994    Quit date: 01/31/2020    Years since quitting: 0.4  . Smokeless tobacco: Never Used  Substance Use Topics  . Alcohol use: No  . Drug use: No    Medications: I have reviewed the patient's current medications. Allergies as of 06/29/2020   No Known Allergies        Medication List       Accurate as of June 29, 2020  2:55 PM. If you have any questions, ask your nurse or doctor.        albuterol 108 (90 Base) MCG/ACT inhaler Commonly known as: VENTOLIN HFA TAKE 2 PUFFS BY MOUTH EVERY 6 HOURS AS NEEDED FOR WHEEZE OR SHORTNESS OF BREATH   atorvastatin 20 MG tablet Commonly known as: LIPITOR Take 1 tablet (20 mg total) by mouth daily.   diphenhydrAMINE 25   mg capsule Commonly known as: BENADRYL Take 25 mg by mouth every 6 (six) hours as needed for allergies.   ibuprofen 200 MG tablet Commonly known as: ADVIL Take 600 mg by mouth every 6 (six) hours as needed for moderate pain.   lisinopril 5 MG tablet Commonly known as: ZESTRIL Take 1 tablet (5 mg total) by mouth daily.   NEXIUM PO Take by mouth. otc   sertraline 50 MG tablet Commonly known as: ZOLOFT TAKE 1 TABLET BY MOUTH EVERY DAY        ROS:  A comprehensive review of systems was negative except for: Cardiovascular: positive for HTN Gastrointestinal: positive for reflux symptoms Integument/breast: positive for breast lump  Blood pressure 137/82, pulse 90, temperature (!) 97 F (36.1 C), temperature source Temporal, resp. rate 18, height 4' 9" (1.448 m), weight 164 lb (74.4 kg), SpO2 96 %. Physical Exam Vitals reviewed. Exam conducted with a chaperone present.  Constitutional:       Appearance: Normal appearance.  HENT:     Head: Normocephalic and atraumatic.     Nose: Nose normal.     Mouth/Throat:     Mouth: Mucous membranes are moist.  Eyes:     Extraocular Movements: Extraocular movements intact.     Pupils: Pupils are equal, round, and reactive to light.  Cardiovascular:     Rate and Rhythm: Normal rate and regular rhythm.  Pulmonary:     Effort: Pulmonary effort is normal.     Breath sounds: Normal breath sounds.  Chest:     Breasts:        Right: No inverted nipple, mass, nipple discharge or skin change.        Left: Mass present. No nipple discharge.     Comments: Left breast mass at 12 oclock just superior to nipple, 3cm in size, Left sided retracted nipple, no discharge, bruising from biopsy, full axilla and difficult to appreciate adenopathy Abdominal:     General: There is no distension.     Palpations: Abdomen is soft.     Tenderness: There is no abdominal tenderness.  Musculoskeletal:        General: No swelling. Normal range of motion.     Cervical back: Normal range of motion. No rigidity.  Lymphadenopathy:     Upper Body:     Right upper body: No supraclavicular or axillary adenopathy.     Left upper body: No supraclavicular adenopathy.  Skin:    General: Skin is warm and dry.  Neurological:     General: No focal deficit present.     Mental Status: She is alert and oriented to person, place, and time.  Psychiatric:        Mood and Affect: Mood normal.        Behavior: Behavior normal.        Thought Content: Thought content normal.        Judgment: Judgment normal.     Results: CLINICAL DATA: Post ultrasound-guided biopsy of a palpable mass in the left breast at 12 o'clock retroareolar and ultrasound-guided biopsy of the larger morphologically abnormal lymph node in the left axilla.  EXAM: DIAGNOSTIC LEFT MAMMOGRAM POST ULTRASOUND BIOPSY  COMPARISON: Previous exams.  FINDINGS: Mammographic images were obtained  following ultrasound-guided biopsy of a palpable mass in the left breast at 12 o'clock retroareolar and ultrasound-guided biopsy of the larger morphologically abnormal lymph node in the left axilla. A ribbon shaped biopsy marking clip is present at the site of the biopsied mass   in the left breast at 12 o'clock retroareolar. A spiral shaped HydroMARK clip is present at the site of the biopsied enlarged lymph node in the left axilla.  IMPRESSION: 1. Ribbon shaped biopsy marking clip at site of biopsied mass in the left breast at 12 o'clock retroareolar.  2. Spiral shaped HydroMARK clip at site of biopsied lymph node in the left axilla.  Final Assessment: Post Procedure Mammograms for Marker Placement  ADDENDUM REPORT: 06/21/2020 12:57  ADDENDUM: PATHOLOGY revealed:  A. BREAST, 12:00, LEFT, BIOPSY: - Invasive Ductal Carcinoma, grade 2/3. - Lymphovascular space involvement. - See comment.  B. LYMPH NODE, AXILLA, LEFT, BIOPSY: - Ductal carcinoma consistent with metastasis.  COMMENT: The greatest dimension in a single core is 1.3 cm. Breast prognostic profile will be performed.  Pathology results are CONCORDANT with imaging findings, per Dr. Jennifer Jarosz.  Pathology results and recommendations below were discussed with patient by telephone on 06/21/2020. Patient reported biopsy site doing well with slight tenderness at the site. Post biopsy care instructions were reviewed and questions were answered. Patient was instructed to call [facility] Mammography Department if any concerns or questions arise related to the biopsy.  Recommendation: Surgical referral. Request for surgical referral was relayed to Mary Patterson RT at [facility] by Linda Nash RN on 06/21/2020.  Addendum by Linda Nash RN on 06/21/2020.   Electronically Signed By: Jennifer Jarosz M.D. On: 06/21/2020 12:57  Electronically  Signed By: Jennifer Jarosz M.D. On: 06/20/2020 13:42   CLINICAL DATA: 53-year-old patient presents for evaluation of a palpable retroareolar left breast mass and left nipple inversion. She denies any nipple discharge.  EXAM: DIGITAL DIAGNOSTIC BILATERAL MAMMOGRAM WITH CAD AND TOMO  ULTRASOUND LEFT BREAST  COMPARISON: Previous exam(s).  ACR Breast Density Category c: The breast tissue is heterogeneously dense, which may obscure small masses.  FINDINGS: There is a new irregular mass in the retroareolar 12 o'clock/central left breast, anterior third, corresponding to the palpable lump. There is associated left nipple retraction. Stable densely calcified degenerating fibroadenoma just lateral to the palpable mass.  Two suspicious left axillary lymph nodes, largest measuring approximately 2 cm, but incompletely imaged.  No mass, architectural distortion, or suspicious microcalcification is identified in the right breast to suggest malignancy.  Mammographic images were processed with CAD.  On physical exam, there is a firm palpable 3 cm mass in the 12 o'clock retroareolar left breast. There is left nipple retraction. The skin of the left breast appears normal. I do not palpate a mass in the left axilla.  Targeted ultrasound is performed, showing heterogeneously hypoechoic irregular mass centered at 12 o'clock retroareolar. Mass measures 2.7 x 1.6 x 2.2 cm. (Please note that on the captured images show the largest measurement is 2.4 cm, but the largest portion of the mass is felt to best most accurately measured as 2.7 cm). Just lateral to the mass is a calcified hypoechoic nodule that corresponds to densely calcified degenerating fibroadenoma in the outer retroareolar left breast on mammography.  Ultrasound the left axilla shows two suspicious lymph nodes, correlating with the findings on the mammogram. Largest lymph node measures 1.2 x 2.2 cm in the  transverse axis. There is marked cortical thickening, with obliteration of the majority of the hilum. Immediately adjacent to this is a smaller lymph node measuring 0.8 x 0.7 x 0.7 cm, with diffuse mild cortical thickening and partial obliteration of the hilum. No additional suspicious lymph nodes identified.  IMPRESSION: Highly suspicious palpable   left breast mass 12 o'clock retroareolar region measures 2.7 cm greatest diameter on ultrasound.  Two left axillary lymph nodes are suspicious for metastatic involvement.  No evidence of malignancy in the right breast.  RECOMMENDATION: Two ultrasound-guided core needle biopsies are recommended, including the palpable mass in the retroareolar 12 o'clock region and the larger of the two suspicious left axillary lymph nodes. Biopsies are being scheduled for the patient.  I have discussed the findings and recommendations with the patient. If applicable, a reminder letter will be sent to the patient regarding the next appointment.  BI-RADS CATEGORY 5: Highly suggestive of malignancy.   Electronically Signed By: Susan Turner M.D. On: 06/13/2020 11:22  SURGICAL PATHOLOGY  * THIS IS AN ADDENDUM REPORT * CASE: [id number]  PATIENT: Elizabeth Graves  Surgical Pathology Report  *Addendum *   Reason for Addendum #1: Breast Biomarker Results  Reason for Addendum #2: Molecular Genetic Test Results, FISH   Clinical History: retroareolar mass   FINAL MICROSCOPIC DIAGNOSIS:   A. BREAST, 12:00, LEFT, BIOPSY:  - Invasive ductal carcinoma, grade 2/3.  - Lymphovascular space involvement.  - See comment.   B. LYMPH NODE, AXILLA, LEFT, BIOPSY:  - Ductal carcinoma consistent with metastasis.   COMMENT:   A. The greatest dimension in a single core is 1.3 cm. Breast prognostic  profile will be performed.   Dr. Kashikar agrees.   Call to the breast center of [place] on 06/21/2020.   GROSS DESCRIPTION:  A: The  specimen is received in formalin labeled with the patient's name  and left breast 12:00, and consists of 3 cores of tan-white fibroadipose  tissue, ranging from 1.5 x 0.2 x 0.2 cm to 2.0 x 0.2 x 0.2 cm. The  specimen is entirely submitted in 1 cassette.  Time in formalin 1:15 PM on 06/20/2020. Cold ischemic time less than 1  minute (per order in EPIC)  B: The specimen is received in formalin labeled with the patient's name  and left axilla, and consists of 4 cores of tan-white fibroadipose  tissue, ranging from 0.9 x 0.2 x 0.1 cm to 1.5 x 0.2 x 0.1 cm. The  specimen is entirely submitted in 1 cassette.  Time in formalin 1:25 PM on 06/20/2020. 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  - No high-grade dysplasia or carcinoma. Pregnancy mentioned and stated  for a couple of months-  With reactive atypia but not convincing to me were new in the left  breast Dr. I agree with exactly what she said is mostly benign there is some  atypia and in that area no way to be sure but I would favor less  reactive. Not enough for malignancy [ Suspicious the case that Dr. Butler said I can get antibiotics okay for  me to case is a pre-existing evaluation okay   Final Diagnosis performed by John Patrick, MD.  Electronically signed  06/21/2020   ADDENDUM:   PROGNOSTIC INDICATOR RESULTS:   Immunohistochemical and morphometric analysis performed manually   The tumor cells are EQUIVOCAL for Her2 (2+). HER-2 by FISH is pending  and will be reported in an addendum.   Estrogen Receptor: NEGATIVE  Progesterone Receptor: NEGATIVE  Proliferation Marker Ki-67: 15%   Comment: The negative hormone receptor study(ies) in the   case has an  internal positive control.   Reference  Range Estrogen and Progesterone Receptor    Negative 0%    Positive >1%   All controls stained appropriately.   ADDENDUM:   FLOURESCENCE IN-SITU HYBRIDIZATION RESULTS:   GROUP 5:  HER2 **NEGATIVE**   On the tissue sample received from this individual HER2 FISH was  performed by a technologist and cell imaging and analysis on the  [employer].   RATIO of HER2/CEN 17 SIGNALS: 0.47  AVERAGE HER2 COPY NUMBER PER CELL: 2.85   The ratio of HER-2 CEP 17 is within the range < 2.0 of HER-2: CEP 17 in  normal breast tissue and a copy number of HER2 signals per cell is <4.0.   Arch Pathol Lab Med 1:1,2018.    Assessment & Plan:  Elizabeth Graves is a 53 y.o. female with a triple negative breast cancer with positive lymph nodes by US biopsy.  She will need neoadjuvant therapy. Discussed breast cancer and treatments. Discussed port placement and risk of bleeding, infection, pneumothorax, malfunction. Discussed that we would plan for this on the right side. Discussed she would undergo further workup with Oncology and we would time Port around their work up/ imaging.  Discussed surgical planning following her neoadjuvant therapy.   -Port placement in the upcoming weeks  -Oncology referral made and patient has appt 07/05/2020         Future Appointments  Date Time Provider Department Center  07/05/2020  1:30 PM Katragadda, Sreedhar, MD [facility] None  08/15/2020 10:00 AM [facility] NURSE [facility]     All questions were answered to the satisfaction of the patient and friend who accompanied her today.   Elizabeth Graves 06/29/2020, 2:55 PM   

## 2020-07-02 ENCOUNTER — Encounter: Payer: Self-pay | Admitting: General Surgery

## 2020-07-04 ENCOUNTER — Encounter (HOSPITAL_COMMUNITY): Payer: Self-pay

## 2020-07-04 ENCOUNTER — Other Ambulatory Visit: Payer: Self-pay

## 2020-07-04 NOTE — Progress Notes (Signed)
[place] [address] [place] [address]   Patient Care Team: Kathyrn Drown, MD as PCP - General (Family Medicine)  CHIEF COMPLAINTS/PURPOSE OF CONSULTATION:  Newly diagnosed left breast cancer  HISTORY OF PRESENTING ILLNESS:  Elizabeth Graves 54 y.o. female is here because of recent diagnosis of left breast cancer, referred from Dr. Curlene Labrum, MD at [facility]. After a benign mammogram in 2019, she felt a mass for several months in her left breast and had a  mammogram done by Dr. Wolfgang Phoenix. A mass and several enlarged lymph nodes were present, and a biopsy of the left breast mass and lymph nodes showed triple negative invasive ductal carcinoma.  Today she denies having any new pains or breast bumps. She denies any breast discharge. She denies any issues with her heart. She denies any numbness/tingling in her hands or feet, except for some mild carpal tunnel syndrome in her left hand.  Her father had GE junction cancer; MGM had throat cancer; paternal aunt had breast cancer. She lives at home with her husband. She does not smoke.  I reviewed her records extensively and corroborated the history with the patient.  SUMMARY OF ONCOLOGIC HISTORY: Oncology History   No history exists.    In terms of breast cancer risk profile:  She menarched at early age of 89.  She has G6P2 with 4 miscarriages and hysterectomy with salpingoooperectomy for vaginal bleeding. She took OCP for about 2 years in her late teens. Age at first childbirth was 53.  MEDICAL HISTORY:  Past Medical History:  Diagnosis Date  . Cancer ([place])    cervical - 20 yrs ago  . Hypertension   . Hypokalemia    tx w/ k-Dur  . Seasonal allergies    tx with benadryl prn  . SVD (spontaneous vaginal delivery)    x 2    SURGICAL HISTORY: Past Surgical History:  Procedure Laterality Date  . ABDOMINAL HYSTERECTOMY    . CO2 Laser of Cervix     for cervical cancer 20 yrs ago   . DILATION AND CURETTAGE OF UTERUS  12/2010   polyp removed  . DILATION AND CURETTAGE OF UTERUS     x 3 for MAB  . LAPAROSCOPIC ASSISTED VAGINAL HYSTERECTOMY  06/09/2012   Procedure: LAPAROSCOPIC ASSISTED VAGINAL HYSTERECTOMY;  Surgeon: Gus Height, MD;  Location: [facility];  Service: Gynecology;  Laterality: N/A;  . SALPINGOOPHORECTOMY  06/09/2012   Procedure: SALPINGO OOPHERECTOMY;  Surgeon: Gus Height, MD;  Location: [facility];  Service: Gynecology;  Laterality: Bilateral;  . TUBAL LIGATION      SOCIAL HISTORY: Social History   Socioeconomic History  . Marital status: Married    Spouse name: Not on file  . Number of children: 2  . Years of education: Not on file  . Highest education level: Not on file  Occupational History  . Occupation: EMPLOYED  Tobacco Use  . Smoking status: Former Smoker    Packs/day: 1.00    Years: 16.00    Pack years: 16.00    Types: Cigarettes    Start date: 05/30/1994    Quit date: 01/31/2020    Years since quitting: 0.4  . Smokeless tobacco: Never Used  Substance and Sexual Activity  . Alcohol use: No  . Drug use: No  . Sexual activity: Yes    Birth control/protection: Surgical  Other Topics Concern  . Not on file  Social History Narrative  . Not on file  Social Determinants of Health   Financial Resource Strain: Low Risk   . Difficulty of Paying Living Expenses: Not very hard  Food Insecurity: No Food Insecurity  . Worried About Charity fundraiser in the Last Year: Never true  . Ran Out of Food in the Last Year: Never true  Transportation Needs: No Transportation Needs  . Lack of Transportation (Medical): No  . Lack of Transportation (Non-Medical): No  Physical Activity: Inactive  . Days of Exercise per Week: 0 days  . Minutes of Exercise per Session: 0 min  Stress: Stress Concern Present  . Feeling of Stress : To some extent  Social Connections: Moderately Isolated  . Frequency of Communication with Friends and Family: Three times a week   . Frequency of Social Gatherings with Friends and Family: Once a week  . Attends Religious Services: Never  . Active Member of Clubs or Organizations: No  . Attends Archivist Meetings: Never  . Marital Status: Married  Human resources officer Violence: Not At Risk  . Fear of Current or Ex-Partner: No  . Emotionally Abused: No  . Physically Abused: No  . Sexually Abused: No    FAMILY HISTORY: Family History  Problem Relation Age of Onset  . Cancer Mother        lung  . Heart disease Mother   . Heart disease Father   . Diverticulitis Sister   . Diabetes Sister   . Diabetes Maternal Grandmother   . Heart disease Maternal Grandmother   . Throat cancer Maternal Grandmother   . Healthy Sister   . Healthy Daughter   . Healthy Son     ALLERGIES:  has No Known Allergies.  MEDICATIONS:  Current Outpatient Medications  Medication Sig Dispense Refill  . albuterol (VENTOLIN HFA) 108 (90 Base) MCG/ACT inhaler TAKE 2 PUFFS BY MOUTH EVERY 6 HOURS AS NEEDED FOR WHEEZE OR SHORTNESS OF BREATH (Patient not taking: Reported on 07/04/2020) 8.5 Inhaler 0  . atorvastatin (LIPITOR) 20 MG tablet Take 1 tablet (20 mg total) by mouth daily. 90 tablet 1  . diphenhydrAMINE (BENADRYL) 25 mg capsule Take 25 mg by mouth every 6 (six) hours as needed for allergies. (Patient not taking: Reported on 07/04/2020)    . Esomeprazole Magnesium (NEXIUM PO) Take by mouth. otc    . ibuprofen (ADVIL,MOTRIN) 200 MG tablet Take 600 mg by mouth every 6 (six) hours as needed for moderate pain. (Patient not taking: Reported on 07/04/2020)    . lisinopril (ZESTRIL) 5 MG tablet Take 1 tablet (5 mg total) by mouth daily. 90 tablet 1  . sertraline (ZOLOFT) 50 MG tablet TAKE 1 TABLET BY MOUTH EVERY DAY 90 tablet 0   No current facility-administered medications for this visit.    REVIEW OF SYSTEMS:   Review of Systems  Constitutional: Negative for appetite change and fatigue.  Respiratory: Positive for shortness of breath  (w/ exertion).   Cardiovascular: Positive for chest pain (when moving breast area).  Neurological: Negative for numbness.  All other systems reviewed and are negative.   PHYSICAL EXAMINATION: ECOG PERFORMANCE STATUS: 0 - Asymptomatic  There were no vitals filed for this visit. There were no vitals filed for this visit. Physical Exam Vitals reviewed.  Constitutional:      Appearance: Normal appearance. She is obese.  Cardiovascular:     Rate and Rhythm: Normal rate and regular rhythm.     Pulses: Normal pulses.     Heart sounds: Normal heart sounds.  Pulmonary:  Effort: Pulmonary effort is normal.     Breath sounds: Normal breath sounds.  Chest:     Breasts:        Right: No mass, nipple discharge or skin change.        Left: Inverted nipple and mass present. No nipple discharge or skin change.    Lymphadenopathy:     Cervical: No cervical adenopathy.     Upper Body:     Right upper body: No supraclavicular or axillary adenopathy.     Left upper body: No supraclavicular or axillary adenopathy.  Neurological:     General: No focal deficit present.     Mental Status: She is alert and oriented to person, place, and time.  Psychiatric:        Mood and Affect: Mood normal.        Behavior: Behavior normal.      LABORATORY DATA:  I have reviewed the data as listed   RADIOGRAPHIC STUDIES: I have personally reviewed the radiological reports and agreed with the findings in the report. US BREAST LTD UNI LEFT INC AXILLA  Result Date: 06/13/2020 CLINICAL DATA:  54 year old patient presents for evaluation of a palpable retroareolar left breast mass and left nipple inversion. She denies any nipple discharge. EXAM: DIGITAL DIAGNOSTIC BILATERAL MAMMOGRAM WITH CAD AND TOMO ULTRASOUND LEFT BREAST COMPARISON:  Previous exam(s). ACR Breast Density Category c: The breast tissue is heterogeneously dense, which may obscure small masses. FINDINGS: There is a new irregular mass in the  retroareolar 12 o'clock/central left breast, anterior third, corresponding to the palpable lump. There is associated left nipple retraction. Stable densely calcified degenerating fibroadenoma just lateral to the palpable mass. Two suspicious left axillary lymph nodes, largest measuring approximately 2 cm, but incompletely imaged. No mass, architectural distortion, or suspicious microcalcification is identified in the right breast to suggest malignancy. Mammographic images were processed with CAD. On physical exam, there is a firm palpable 3 cm mass in the 12 o'clock retroareolar left breast. There is left nipple retraction. The skin of the left breast appears normal. I do not palpate a mass in the left axilla. Targeted ultrasound is performed, showing heterogeneously hypoechoic irregular mass centered at 12 o'clock retroareolar. Mass measures 2.7 x 1.6 x 2.2 cm. (Please note that on the captured images show the largest measurement is 2.4 cm, but the largest portion of the mass is felt to best most accurately measured as 2.7 cm). Just lateral to the mass is a calcified hypoechoic nodule that corresponds to densely calcified degenerating fibroadenoma in the outer retroareolar left breast on mammography. Ultrasound the left axilla shows two suspicious lymph nodes, correlating with the findings on the mammogram. Largest lymph node measures 1.2 x 2.2 cm in the transverse axis. There is marked cortical thickening, with obliteration of the majority of the hilum. Immediately adjacent to this is a smaller lymph node measuring 0.8 x 0.7 x 0.7 cm, with diffuse mild cortical thickening and partial obliteration of the hilum. No additional suspicious lymph nodes identified. IMPRESSION: Highly suspicious palpable left breast mass 12 o'clock retroareolar region measures 2.7 cm greatest diameter on ultrasound. Two left axillary lymph nodes are suspicious for metastatic involvement. No evidence of malignancy in the right breast.  RECOMMENDATION: Two ultrasound-guided core needle biopsies are recommended, including the palpable mass in the retroareolar 12 o'clock region and the larger of the two suspicious left axillary lymph nodes. Biopsies are being scheduled for the patient. I have discussed the findings and recommendations with the patient.  If applicable, a reminder letter will be sent to the patient regarding the next appointment. BI-RADS CATEGORY  5: Highly suggestive of malignancy. Electronically Signed   By: Curlene Dolphin M.D.   On: 06/13/2020 11:22   MM DIAG BREAST TOMO BILATERAL  Result Date: 06/13/2020 CLINICAL DATA:  53 year old patient presents for evaluation of a palpable retroareolar left breast mass and left nipple inversion. She denies any nipple discharge. EXAM: DIGITAL DIAGNOSTIC BILATERAL MAMMOGRAM WITH CAD AND TOMO ULTRASOUND LEFT BREAST COMPARISON:  Previous exam(s). ACR Breast Density Category c: The breast tissue is heterogeneously dense, which may obscure small masses. FINDINGS: There is a new irregular mass in the retroareolar 12 o'clock/central left breast, anterior third, corresponding to the palpable lump. There is associated left nipple retraction. Stable densely calcified degenerating fibroadenoma just lateral to the palpable mass. Two suspicious left axillary lymph nodes, largest measuring approximately 2 cm, but incompletely imaged. No mass, architectural distortion, or suspicious microcalcification is identified in the right breast to suggest malignancy. Mammographic images were processed with CAD. On physical exam, there is a firm palpable 3 cm mass in the 12 o'clock retroareolar left breast. There is left nipple retraction. The skin of the left breast appears normal. I do not palpate a mass in the left axilla. Targeted ultrasound is performed, showing heterogeneously hypoechoic irregular mass centered at 12 o'clock retroareolar. Mass measures 2.7 x 1.6 x 2.2 cm. (Please note that on the captured images  show the largest measurement is 2.4 cm, but the largest portion of the mass is felt to best most accurately measured as 2.7 cm). Just lateral to the mass is a calcified hypoechoic nodule that corresponds to densely calcified degenerating fibroadenoma in the outer retroareolar left breast on mammography. Ultrasound the left axilla shows two suspicious lymph nodes, correlating with the findings on the mammogram. Largest lymph node measures 1.2 x 2.2 cm in the transverse axis. There is marked cortical thickening, with obliteration of the majority of the hilum. Immediately adjacent to this is a smaller lymph node measuring 0.8 x 0.7 x 0.7 cm, with diffuse mild cortical thickening and partial obliteration of the hilum. No additional suspicious lymph nodes identified. IMPRESSION: Highly suspicious palpable left breast mass 12 o'clock retroareolar region measures 2.7 cm greatest diameter on ultrasound. Two left axillary lymph nodes are suspicious for metastatic involvement. No evidence of malignancy in the right breast. RECOMMENDATION: Two ultrasound-guided core needle biopsies are recommended, including the palpable mass in the retroareolar 12 o'clock region and the larger of the two suspicious left axillary lymph nodes. Biopsies are being scheduled for the patient. I have discussed the findings and recommendations with the patient. If applicable, a reminder letter will be sent to the patient regarding the next appointment. BI-RADS CATEGORY  5: Highly suggestive of malignancy. Electronically Signed   By: Curlene Dolphin M.D.   On: 06/13/2020 11:22   MM CLIP PLACEMENT LEFT  Result Date: 06/20/2020 CLINICAL DATA:  Post ultrasound-guided biopsy of a palpable mass in the left breast at 12 o'clock retroareolar and ultrasound-guided biopsy of the larger morphologically abnormal lymph node in the left axilla. EXAM: DIAGNOSTIC LEFT MAMMOGRAM POST ULTRASOUND BIOPSY COMPARISON:  Previous exams. FINDINGS: Mammographic images were  obtained following ultrasound-guided biopsy of a palpable mass in the left breast at 12 o'clock retroareolar and ultrasound-guided biopsy of the larger morphologically abnormal lymph node in the left axilla. A ribbon shaped biopsy marking clip is present at the site of the biopsied mass in the left breast at 12  o'clock retroareolar. A spiral shaped HydroMARK clip is present at the site of the biopsied enlarged lymph node in the left axilla. IMPRESSION: 1. Ribbon shaped biopsy marking clip at site of biopsied mass in the left breast at 12 o'clock retroareolar. 2. Spiral shaped HydroMARK clip at site of biopsied lymph node in the left axilla. Final Assessment: Post Procedure Mammograms for Marker Placement Electronically Signed   By: Everlean Alstrom M.D.   On: 06/20/2020 13:42   [place] LT BREAST BX W LOC DEV 1ST LESION IMG BX SPEC US GUIDE  Addendum Date: 06/21/2020   ADDENDUM REPORT: 06/21/2020 12:57 ADDENDUM: PATHOLOGY revealed: A. BREAST, 12:00, LEFT, BIOPSY: - Invasive Ductal Carcinoma, grade 2/3. - Lymphovascular space involvement. - See comment. B. LYMPH NODE, AXILLA, LEFT, BIOPSY: - Ductal carcinoma consistent with metastasis. COMMENT: The greatest dimension in a single core is 1.3 cm. Breast prognostic profile will be performed. Pathology results are CONCORDANT with imaging findings, per Dr. Everlean Alstrom. Pathology results and recommendations below were discussed with patient by telephone on 06/21/2020. Patient reported biopsy site doing well with slight tenderness at the site. Post biopsy care instructions were reviewed and questions were answered. Patient was instructed to call [facility] Mammography Department if any concerns or questions arise related to the biopsy. Recommendation: Surgical referral. Request for surgical referral was relayed to Kathi Der RT at [facility] Mammography Department by Electa Sniff RN on 06/21/2020. Addendum by Electa Sniff RN on 06/21/2020.  Electronically Signed   By: Everlean Alstrom M.D.   On: 06/21/2020 12:57   Result Date: 06/21/2020 CLINICAL DATA:  54 year old female presents for ultrasound-guided core biopsy of a suspicious mass in the left breast at 12 o'clock retroareolar and a suspicious lymph node in the left axilla. EXAM: ULTRASOUND GUIDED LEFT BREAST CORE NEEDLE BIOPSY ULTRASOUND-GUIDED LEFT AXILLA CORE NEEDLE BIOPSY COMPARISON:  Previous exam(s). PROCEDURE: I met with the patient and we discussed the procedure of ultrasound-guided biopsy, including benefits and alternatives. We discussed the high likelihood of a successful procedure. We discussed the risks of the procedure, including infection, bleeding, tissue injury, clip migration, and inadequate sampling. Informed written consent was given. The usual time-out protocol was performed immediately prior to the procedure. SITE 1: LEFT BREAST 12 O'CLOCK RETROAREOLAR MASS: Lesion quadrant: UPPER INNER Using sterile technique and 1% Lidocaine as local anesthetic, under direct ultrasound visualization, a 14 gauge spring-loaded device was used to perform biopsy of the mass in the left breast at 12 o'clock retroareolar using a lateral to medial approach. At the conclusion of the procedure a ribbon shaped tissue marker clip was deployed into the biopsy cavity. Follow up 2 view mammogram was performed and dictated separately. SITE 2: LEFT AXILLA LYMPH NODE: Lesion quadrant: UPPER-OUTER Using sterile technique and 1% Lidocaine as local anesthetic, under direct ultrasound visualization, a 14 gauge spring-loaded device was used to perform biopsy of the abnormal lymph node in the left axilla using a inferolateral to superomedial approach. At the conclusion of the procedure a spiral shaped HydroMARK tissue marker clip was deployed into the biopsy cavity. Follow up 2 view mammogram was performed and dictated separately. IMPRESSION: 1. Ultrasound-guided biopsy of the mass in the left breast at 12  o'clock retroareolar. 2. Ultrasound-guided biopsy of the larger morphologically abnormal lymph node in the left axilla. Electronically Signed: By: Everlean Alstrom M.D. On: 06/20/2020 13:43   [place] LT BREAST BX W LOC DEV EA ADD LESION IMG BX SPEC US GUIDE  Addendum  Date: 06/21/2020   ADDENDUM REPORT: 06/21/2020 12:57 ADDENDUM: PATHOLOGY revealed: A. BREAST, 12:00, LEFT, BIOPSY: - Invasive Ductal Carcinoma, grade 2/3. - Lymphovascular space involvement. - See comment. B. LYMPH NODE, AXILLA, LEFT, BIOPSY: - Ductal carcinoma consistent with metastasis. COMMENT: The greatest dimension in a single core is 1.3 cm. Breast prognostic profile will be performed. Pathology results are CONCORDANT with imaging findings, per Dr. Everlean Alstrom. Pathology results and recommendations below were discussed with patient by telephone on 06/21/2020. Patient reported biopsy site doing well with slight tenderness at the site. Post biopsy care instructions were reviewed and questions were answered. Patient was instructed to call [facility] Mammography Department if any concerns or questions arise related to the biopsy. Recommendation: Surgical referral. Request for surgical referral was relayed to Kathi Der RT at [facility] Mammography Department by Electa Sniff RN on 06/21/2020. Addendum by Electa Sniff RN on 06/21/2020. Electronically Signed   By: Everlean Alstrom M.D.   On: 06/21/2020 12:57   Result Date: 06/21/2020 CLINICAL DATA:  54 year old female presents for ultrasound-guided core biopsy of a suspicious mass in the left breast at 12 o'clock retroareolar and a suspicious lymph node in the left axilla. EXAM: ULTRASOUND GUIDED LEFT BREAST CORE NEEDLE BIOPSY ULTRASOUND-GUIDED LEFT AXILLA CORE NEEDLE BIOPSY COMPARISON:  Previous exam(s). PROCEDURE: I met with the patient and we discussed the procedure of ultrasound-guided biopsy, including benefits and alternatives. We discussed the high likelihood of  a successful procedure. We discussed the risks of the procedure, including infection, bleeding, tissue injury, clip migration, and inadequate sampling. Informed written consent was given. The usual time-out protocol was performed immediately prior to the procedure. SITE 1: LEFT BREAST 12 O'CLOCK RETROAREOLAR MASS: Lesion quadrant: UPPER INNER Using sterile technique and 1% Lidocaine as local anesthetic, under direct ultrasound visualization, a 14 gauge spring-loaded device was used to perform biopsy of the mass in the left breast at 12 o'clock retroareolar using a lateral to medial approach. At the conclusion of the procedure a ribbon shaped tissue marker clip was deployed into the biopsy cavity. Follow up 2 view mammogram was performed and dictated separately. SITE 2: LEFT AXILLA LYMPH NODE: Lesion quadrant: UPPER-OUTER Using sterile technique and 1% Lidocaine as local anesthetic, under direct ultrasound visualization, a 14 gauge spring-loaded device was used to perform biopsy of the abnormal lymph node in the left axilla using a inferolateral to superomedial approach. At the conclusion of the procedure a spiral shaped HydroMARK tissue marker clip was deployed into the biopsy cavity. Follow up 2 view mammogram was performed and dictated separately. IMPRESSION: 1. Ultrasound-guided biopsy of the mass in the left breast at 12 o'clock retroareolar. 2. Ultrasound-guided biopsy of the larger morphologically abnormal lymph node in the left axilla. Electronically Signed: By: Everlean Alstrom M.D. On: 06/20/2020 13:43     ASSESSMENT:  1.  Triple negative left breast cancer (T2N1): -Patient identified left breast mass with left nipple retraction about 2 months ago. -Mammogram on 06/13/2020 showed mass in the retroareolar 12 o'clock position in the left breast.  2 suspicious left axillary lymph nodes, largest measuring 2 cm. -Breast ultrasound showed 2.7 x 1.6 x 2.2 cm mass in the 12:00 retroareolar position.  Just  lateral to the mass is a calcified hypoechoic nodule that corresponds to densely calcified degenerating fibroadenoma. -Left breast 12:00 biopsy showed invasive ductal carcinoma, grade 2/3.  Left axillary lymph node biopsy was consistent with meta stasis. -ER negative, PR negative and Ki-67 15%.  HER-2  equivocal by IHC.  HER-2 FISH negative. -She was evaluated by Dr. Constance Haw and was referred to [place].  2.  Family history: -Father had gastroesophageal junction cancer.  Maternal grandmother had throat cancer and paternal aunt had breast cancer.   PLAN:  1.  T2N1 left breast TNBC: -I have reviewed pathology report with the patient in detail. -I have reviewed management of triple negative breast cancer with the patient in detail.  As her tumor is more than 2 cm and lymph node positive, I have recommended neoadjuvant chemotherapy followed by surgical resection. -I have recommended dose dense AC followed by weekly paclitaxel. -We will obtain staging work-up with CT CAP with contrast. -I will also obtain MRI of the breast because of mass which was visualized lateral to the malignancy on breast ultrasound. -I will also obtain echocardiogram. -She will proceed with port placement for chemotherapy. -We will also make a referral to genetic evaluation. -I will see her back after imaging studies done.   All questions were answered. The patient knows to call the clinic with any problems, questions or concerns.   Derek Jack, MD 07/04/20 5:13 PM  [place] [phone]   I, Milinda Antis, am acting as a scribe for Dr. Sanda Linger.  I, Derek Jack MD, have reviewed the above documentation for accuracy and completeness, and I agree with the above.

## 2020-07-05 ENCOUNTER — Other Ambulatory Visit (HOSPITAL_COMMUNITY): Payer: Self-pay

## 2020-07-05 ENCOUNTER — Inpatient Hospital Stay (HOSPITAL_COMMUNITY): Payer: BC Managed Care – PPO | Attending: Hematology | Admitting: Hematology

## 2020-07-05 ENCOUNTER — Encounter (HOSPITAL_COMMUNITY): Payer: Self-pay | Admitting: Hematology

## 2020-07-05 ENCOUNTER — Inpatient Hospital Stay (HOSPITAL_COMMUNITY): Payer: BC Managed Care – PPO

## 2020-07-05 VITALS — BP 125/73 | HR 75 | Temp 98.7°F | Resp 18 | Ht <= 58 in | Wt 166.0 lb

## 2020-07-05 DIAGNOSIS — Z801 Family history of malignant neoplasm of trachea, bronchus and lung: Secondary | ICD-10-CM | POA: Insufficient documentation

## 2020-07-05 DIAGNOSIS — Z171 Estrogen receptor negative status [ER-]: Secondary | ICD-10-CM | POA: Diagnosis not present

## 2020-07-05 DIAGNOSIS — E042 Nontoxic multinodular goiter: Secondary | ICD-10-CM | POA: Insufficient documentation

## 2020-07-05 DIAGNOSIS — Z800 Family history of malignant neoplasm of digestive organs: Secondary | ICD-10-CM | POA: Insufficient documentation

## 2020-07-05 DIAGNOSIS — C773 Secondary and unspecified malignant neoplasm of axilla and upper limb lymph nodes: Secondary | ICD-10-CM | POA: Insufficient documentation

## 2020-07-05 DIAGNOSIS — I10 Essential (primary) hypertension: Secondary | ICD-10-CM | POA: Insufficient documentation

## 2020-07-05 DIAGNOSIS — Z79899 Other long term (current) drug therapy: Secondary | ICD-10-CM | POA: Diagnosis not present

## 2020-07-05 DIAGNOSIS — G5603 Carpal tunnel syndrome, bilateral upper limbs: Secondary | ICD-10-CM | POA: Insufficient documentation

## 2020-07-05 DIAGNOSIS — Z8541 Personal history of malignant neoplasm of cervix uteri: Secondary | ICD-10-CM | POA: Insufficient documentation

## 2020-07-05 DIAGNOSIS — Z803 Family history of malignant neoplasm of breast: Secondary | ICD-10-CM | POA: Diagnosis not present

## 2020-07-05 DIAGNOSIS — Z833 Family history of diabetes mellitus: Secondary | ICD-10-CM | POA: Diagnosis not present

## 2020-07-05 DIAGNOSIS — E669 Obesity, unspecified: Secondary | ICD-10-CM | POA: Insufficient documentation

## 2020-07-05 DIAGNOSIS — M47816 Spondylosis without myelopathy or radiculopathy, lumbar region: Secondary | ICD-10-CM | POA: Diagnosis not present

## 2020-07-05 DIAGNOSIS — R918 Other nonspecific abnormal finding of lung field: Secondary | ICD-10-CM | POA: Diagnosis not present

## 2020-07-05 DIAGNOSIS — R234 Changes in skin texture: Secondary | ICD-10-CM | POA: Diagnosis not present

## 2020-07-05 DIAGNOSIS — Z808 Family history of malignant neoplasm of other organs or systems: Secondary | ICD-10-CM | POA: Diagnosis not present

## 2020-07-05 DIAGNOSIS — R0602 Shortness of breath: Secondary | ICD-10-CM | POA: Insufficient documentation

## 2020-07-05 DIAGNOSIS — Z8249 Family history of ischemic heart disease and other diseases of the circulatory system: Secondary | ICD-10-CM | POA: Insufficient documentation

## 2020-07-05 DIAGNOSIS — C50919 Malignant neoplasm of unspecified site of unspecified female breast: Secondary | ICD-10-CM

## 2020-07-05 DIAGNOSIS — Z87891 Personal history of nicotine dependence: Secondary | ICD-10-CM | POA: Insufficient documentation

## 2020-07-05 DIAGNOSIS — C50912 Malignant neoplasm of unspecified site of left female breast: Secondary | ICD-10-CM | POA: Diagnosis not present

## 2020-07-05 DIAGNOSIS — Z90721 Acquired absence of ovaries, unilateral: Secondary | ICD-10-CM | POA: Insufficient documentation

## 2020-07-05 DIAGNOSIS — R079 Chest pain, unspecified: Secondary | ICD-10-CM | POA: Insufficient documentation

## 2020-07-05 DIAGNOSIS — C50012 Malignant neoplasm of nipple and areola, left female breast: Secondary | ICD-10-CM | POA: Insufficient documentation

## 2020-07-05 DIAGNOSIS — N6453 Retraction of nipple: Secondary | ICD-10-CM | POA: Insufficient documentation

## 2020-07-05 LAB — CBC WITH DIFFERENTIAL/PLATELET
Abs Immature Granulocytes: 0.03 10*3/uL (ref 0.00–0.07)
Basophils Absolute: 0.1 10*3/uL (ref 0.0–0.1)
Basophils Relative: 1 %
Eosinophils Absolute: 0.4 10*3/uL (ref 0.0–0.5)
Eosinophils Relative: 4 %
HCT: 40.5 % (ref 36.0–46.0)
Hemoglobin: 13.4 g/dL (ref 12.0–15.0)
Immature Granulocytes: 0 %
Lymphocytes Relative: 32 %
Lymphs Abs: 3 10*3/uL (ref 0.7–4.0)
MCH: 28.2 pg (ref 26.0–34.0)
MCHC: 33.1 g/dL (ref 30.0–36.0)
MCV: 85.3 fL (ref 80.0–100.0)
Monocytes Absolute: 0.7 10*3/uL (ref 0.1–1.0)
Monocytes Relative: 7 %
Neutro Abs: 5.2 10*3/uL (ref 1.7–7.7)
Neutrophils Relative %: 56 %
Platelets: 365 10*3/uL (ref 150–400)
RBC: 4.75 MIL/uL (ref 3.87–5.11)
RDW: 13.5 % (ref 11.5–15.5)
WBC: 9.4 10*3/uL (ref 4.0–10.5)
nRBC: 0 % (ref 0.0–0.2)

## 2020-07-05 LAB — COMPREHENSIVE METABOLIC PANEL
ALT: 24 U/L (ref 0–44)
AST: 17 U/L (ref 15–41)
Albumin: 4.1 g/dL (ref 3.5–5.0)
Alkaline Phosphatase: 86 U/L (ref 38–126)
Anion gap: 9 (ref 5–15)
BUN: 15 mg/dL (ref 6–20)
CO2: 25 mmol/L (ref 22–32)
Calcium: 9.2 mg/dL (ref 8.9–10.3)
Chloride: 103 mmol/L (ref 98–111)
Creatinine, Ser: 0.95 mg/dL (ref 0.44–1.00)
GFR calc Af Amer: >60 mL/min (ref >60)
GFR calc non Af Amer: >60 mL/min (ref >60)
Glucose, Bld: 114 mg/dL — ABNORMAL HIGH (ref 70–99)
Potassium: 3.8 mmol/L (ref 3.5–5.1)
Sodium: 137 mmol/L (ref 135–145)
Total Bilirubin: 0.4 mg/dL (ref 0.3–1.2)
Total Protein: 7.6 g/dL (ref 6.5–8.1)

## 2020-07-05 NOTE — Progress Notes (Signed)
I met with patient today during visit to the clinic. I explained my role in the patient's care and provided contact information. I discussed the rationale for follow-up scans and next steps of work up. I explained to patient that she will get further education about her treatment once the scan results and pathology reports are available.

## 2020-07-05 NOTE — Patient Instructions (Signed)
[place] at [facility] Discharge Instructions  You were seen today by Dr. Delton Coombes.  He talked with you about your personal history, your family history and what events brought you to [place].  We are medical oncology specialists.  We treat cancers.  Dr. Delton Coombes talked with you about your recent biopsy and what it showed. It shows that your cancer is called triple negative breast cancer. He discussed with you that this is considered an aggressive type breast cancer. It is a stage 2b cancer.    He wants you complete your workup which will consist of an echocardiogram which looks at the functioning of your heart. He also wants to do a CT scan of your chest,abdomen, and pelvis to make sure that the cancer hasn't spread to the rest of your body. We are doing this because one of your lymph nodes were positive.  He talked with you about doing genetic testing as well.    We will need to place a port a cath because this is the safest way to administer chemotherapy.    He will give you more details on the treatment plan once you have had your surgery and scans.     Thank you for choosing [place] at [facility] to provide your oncology and hematology care.  To afford each patient quality time with our provider, please arrive at least 15 minutes before your scheduled appointment time.   If you have a lab appointment with the [facility] please come in thru the Main Entrance and check in at the main information desk.  You need to re-schedule your appointment should you arrive 10 or more minutes late.  We strive to give you quality time with our providers, and arriving late affects you and other patients whose appointments are after yours.  Also, if you no show three or more times for appointments you may be dismissed from the clinic at the providers discretion.     Again, thank you for choosing [facility]  Our hope is that these requests will  decrease the amount of time that you wait before being seen by our physicians.       _____________________________________________________________  Should you have questions after your visit to [facility], please contact our office at [phone] between the hours of 8:00 a.m. and 4:30 p.m.  Voicemails left after 4:00 p.m. will not be returned until the following business day.  For prescription refill requests, have your pharmacy contact our office and allow 72 hours.    Due to Covid, you will need to wear a mask upon entering the hospital. If you do not have a mask, a mask will be given to you at the Main Entrance upon arrival. For doctor visits, patients may have 1 support person with them. For treatment visits, patients can not have anyone with them due to social distancing guidelines and our immunocompromised population.

## 2020-07-06 ENCOUNTER — Other Ambulatory Visit: Payer: Self-pay

## 2020-07-06 ENCOUNTER — Encounter (HOSPITAL_COMMUNITY): Payer: Self-pay | Admitting: Genetic Counselor

## 2020-07-06 ENCOUNTER — Inpatient Hospital Stay (HOSPITAL_BASED_OUTPATIENT_CLINIC_OR_DEPARTMENT_OTHER): Payer: BC Managed Care – PPO | Admitting: Genetic Counselor

## 2020-07-06 DIAGNOSIS — Z800 Family history of malignant neoplasm of digestive organs: Secondary | ICD-10-CM | POA: Diagnosis not present

## 2020-07-06 DIAGNOSIS — Z8541 Personal history of malignant neoplasm of cervix uteri: Secondary | ICD-10-CM | POA: Diagnosis not present

## 2020-07-06 DIAGNOSIS — C539 Malignant neoplasm of cervix uteri, unspecified: Secondary | ICD-10-CM

## 2020-07-06 DIAGNOSIS — Z803 Family history of malignant neoplasm of breast: Secondary | ICD-10-CM

## 2020-07-06 DIAGNOSIS — Z801 Family history of malignant neoplasm of trachea, bronchus and lung: Secondary | ICD-10-CM

## 2020-07-06 DIAGNOSIS — C50919 Malignant neoplasm of unspecified site of unspecified female breast: Secondary | ICD-10-CM | POA: Diagnosis not present

## 2020-07-06 LAB — CANCER ANTIGEN 27.29: CA 27.29: 23.5 U/mL (ref 0.0–38.6)

## 2020-07-06 LAB — CANCER ANTIGEN 15-3: CA 15-3: 17.6 U/mL (ref 0.0–25.0)

## 2020-07-06 NOTE — Progress Notes (Signed)
REFERRING PROVIDER: Derek Jack, MD [address] [place],  [place] [address]  PRIMARY PROVIDER:  Kathyrn Drown, MD  PRIMARY REASON FOR VISIT:  1. Triple negative malignant neoplasm of breast ([place])   2. Malignant neoplasm of cervix, unspecified site ([place])   3. Family history of breast cancer   4. Family history of esophageal cancer   5. Family history of lung cancer   6. Family history of throat cancer      I connected with Ms. Nester on 07/06/2020 at 9:00 am EDT by video conference and verified that I am speaking with the correct person using two identifiers.   Patient location: [facility] Provider location: [facility] office  HISTORY OF PRESENT ILLNESS:   Ms. Solow, a 54 y.o. female, was seen for a [facility] cancer genetics consultation at the request of Dr. Delton Coombes due to a personal and family history of cancer.  Ms. Timmers presents to clinic today to discuss the possibility of a hereditary predisposition to cancer, genetic testing, and to further clarify her future cancer risks, as well as potential cancer risks for family members.   In June 2021, at the age of 73, Ms. 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 The treatment plan includes neoadjuvant chemotherapy and surgery.    RISK FACTORS:  Menarche was at age 38.  First live birth at age 75.  OCP use for approximately 2 years.  Ovaries intact: no.  Hysterectomy: yes.  Menopausal status: postmenopausal.  HRT use: 1 years. Colonoscopy: no. Mammogram within the last year: yes. Number of breast biopsies: 1. Any excessive radiation exposure in the past: no   Past Medical History:  Diagnosis Date  . Cancer ([place])    cervical - 20 yrs ago  . Carpal tunnel syndrome   . Family history of breast cancer   . Family history of esophageal cancer   . Family history of lung cancer   . Family history of throat cancer   . Hypertension   . Hypokalemia    tx w/ k-Dur   . Seasonal allergies    tx with benadryl prn  . SVD (spontaneous vaginal delivery)    x 2    Past Surgical History:  Procedure Laterality Date  . ABDOMINAL HYSTERECTOMY    . CO2 Laser of Cervix     for cervical cancer 20 yrs ago  . DILATION AND CURETTAGE OF UTERUS  12/2010   polyp removed  . DILATION AND CURETTAGE OF UTERUS     x 3 for MAB  . LAPAROSCOPIC ASSISTED VAGINAL HYSTERECTOMY  06/09/2012   Procedure: LAPAROSCOPIC ASSISTED VAGINAL HYSTERECTOMY;  Surgeon: Gus Height, MD;  Location: [facility];  Service: Gynecology;  Laterality: N/A;  . SALPINGOOPHORECTOMY  06/09/2012   Procedure: SALPINGO OOPHERECTOMY;  Surgeon: Gus Height, MD;  Location: [facility];  Service: Gynecology;  Laterality: Bilateral;  . TUBAL LIGATION      Social History   Socioeconomic History  . Marital status: Married    Spouse name: Not on file  . Number of children: 2  . Years of education: Not on file  . Highest education level: Not on file  Occupational History  . Occupation: EMPLOYED  Tobacco Use  . Smoking status: Former Smoker    Packs/day: 1.00    Years: 16.00    Pack years: 16.00    Types: Cigarettes    Start date: 05/30/1994    Quit date: 01/31/2020    Years  since quitting: 0.4  . Smokeless tobacco: Never Used  Substance and Sexual Activity  . Alcohol use: No  . Drug use: No  . Sexual activity: Yes    Birth control/protection: Surgical  Other Topics Concern  . Not on file  Social History Narrative  . Not on file   Social Determinants of Health   Financial Resource Strain: Low Risk   . Difficulty of Paying Living Expenses: Not very hard  Food Insecurity: No Food Insecurity  . Worried About Charity fundraiser in the Last Year: Never true  . Ran Out of Food in the Last Year: Never true  Transportation Needs: No Transportation Needs  . Lack of Transportation (Medical): No  . Lack of Transportation (Non-Medical): No  Physical Activity: Inactive  . Days of Exercise per Week: 0 days  .  Minutes of Exercise per Session: 0 min  Stress: Stress Concern Present  . Feeling of Stress : To some extent  Social Connections: Moderately Isolated  . Frequency of Communication with Friends and Family: Three times a week  . Frequency of Social Gatherings with Friends and Family: Once a week  . Attends Religious Services: Never  . Active Member of Clubs or Organizations: No  . Attends Archivist Meetings: Never  . Marital Status: Married     FAMILY HISTORY:  We obtained a detailed, 4-generation family history.  Significant diagnoses are listed below: Family History  Problem Relation Age of Onset  . Heart disease Mother   . Lung cancer Mother 67       smoker  . Heart disease Father   . Esophageal cancer Father        GE junction, dx. in his early 61s  . Diverticulitis Sister   . Diabetes Sister   . Diabetes Maternal Grandmother   . Heart disease Maternal Grandmother   . Throat cancer Maternal Grandmother        dx. late 50s  . Healthy Sister   . Healthy Daughter   . Healthy Son   . Breast cancer Paternal Aunt        limited info   Ms. Notarianni has a son (age 69) and a daughter (age 43). She has one adopted sister and two biological sisters who are in their 72s. None of these family members have had cancer.  Ms. Tally mother died at the age of 72 from lung cancer, which had been diagnosed around the age of 58. Her mother was a smoker. Ms. Laguardia mother did not have any siblings. Her maternal grandmother died in her late 35s from throat cancer, and she does not have information about her maternal grandfather. There are no other known diagnoses of cancer on the maternal side of the family.  Ms. Opdahl father died in his 76s from GE junction cancer, which had been diagnosed in his early 90s. She had three paternal aunts and three paternal uncles. One aunt had breast cancer, although she does not know at what age. She does not know of cancer among paternal first cousins.  Her paternal grandparents died older than 45 and did not have cancer. There are no other known diagnoses of cancer on the paternal side of the family.  Ms. Kraker is unaware of previous family history of genetic testing for hereditary cancer risks. Patient's maternal ancestors are of [place] descent, and paternal ancestors are of unknown descent. There is no reported Ashkenazi Jewish ancestry. There is no known consanguinity.  GENETIC COUNSELING ASSESSMENT: Ms.  Loredo is a 54 y.o. female with a personal history of triple negative breast cancer and a family history of breast cancer, which is somewhat suggestive of a hereditary cancer syndrome and predisposition to cancer. We, therefore, discussed and recommended the following at today's visit.   DISCUSSION: We discussed that approximately 5-10% of breast cancer is hereditary, with most cases associated with the BRCA1 and BRCA2 genes. There are other genes that can be associated with hereditary breast cancer syndromes. These include ATM, CHEK2, PALB2, etc. We discussed that testing is beneficial for several reasons, including knowing about other cancer risks, identifying potential screening and risk-reduction options that may be appropriate, and to understand if other family members could be at risk for cancer and allow them to undergo genetic testing.  We reviewed the characteristics, features and inheritance patterns of hereditary cancer syndromes. We also discussed genetic testing, including the appropriate family members to test, the process of testing, insurance coverage and turn-around-time for results. We discussed the implications of a negative, positive and/or variant of uncertain significant result. We recommended Ms. Luallen pursue genetic testing for the [facility] Common Hereditary Cancers panel.   The Common Hereditary Cancers Panel offered by [facility] includes sequencing and/or deletion duplication testing of the following 48 genes: APC, ATM, AXIN2,  BARD1, BMPR1A, BRCA1, BRCA2, BRIP1, CDH1, CDK4, CDKN2A (p14ARF), CDKN2A (p16INK4a), CHEK2, CTNNA1, DICER1, EPCAM (Deletion/duplication testing only), GREM1 (promoter region deletion/duplication testing only), KIT, MEN1, MLH1, MSH2, MSH3, MSH6, MUTYH, NBN, NF1, NTHL1, PALB2, PDGFRA, PMS2, POLD1, POLE, PTEN, RAD50, RAD51C, RAD51D, RNF43, SDHB, SDHC, SDHD, SMAD4, SMARCA4. STK11, TP53, TSC1, TSC2, and VHL.  The following genes are evaluated for sequence changes only: SDHA and HOXB13 c.251G>A variant only.   Based on Ms. Guedea personal and family history of cancer, she meets medical criteria for genetic testing. Despite that she meets criteria, she may still have an out of pocket cost. We discussed that if her out of pocket cost for testing is over $100, the laboratory will reach out to let her know. If the out of pocket cost of testing is less than $100 she will be billed by the genetic testing laboratory.   PLAN: After considering the risks, benefits, and limitations, Ms. Bosch provided informed consent to pursue genetic testing and the blood sample was sent to [facility] for analysis of the Common Hereditary Cancers Panel. Results should be available within approximately two-three weeks' time, at which point they will be disclosed by telephone to Ms. Hulse, as will any additional recommendations warranted by these results. Ms. Izquierdo will receive a summary of her genetic counseling visit and a copy of her results once available. This information will also be available in Epic.   Ms. Vanleeuwen questions were answered to her satisfaction today. Our contact information was provided should additional questions or concerns arise. Thank you for the referral and allowing [place] to share in the care of your patient.   Clint Guy, [place], [facility] Licensed, Certified Dispensing [email or web address] Phone: [phone]  The patient was seen for a total of 35 minutes in face-to-face genetic  counseling.  This patient was discussed with Drs. Magrinat, Lindi Adie and/or Burr Medico who agrees with the above.    _______________________________________________________________________ For Office Staff:  Number of people involved in session: 1 Was an Intern/ student involved with case: no

## 2020-07-07 NOTE — H&P (Signed)
[facility] History and Physical  Reason for Referral: Left Breast Cancer  Referring Physician:  Dr. Wolfgang Phoenix      Chief Complaint    New Patient (Initial Visit)      CHYLA SCHLENDER is a 54 y.o. female.  HPI: Ms. Holaway is 54 yo who recently went to see Dr. Wolfgang Phoenix because she felt a mass for several months in her left breast. Her last mammogram was in 2019 and was benign. She denied any prior abnormal mammograms, any prior biopsies, any nipple discharge or changes, and was sent for mammogram by Dr. Wolfgang Phoenix.  Her imaging has revealed a mass and enlarged lymph nodes. Biopsies of the left breast mass and lymph nodes indicates Triple Negative Invasive Ductal Cancer.  She was sent to me from Dr. Wolfgang Phoenix prior to her imaging or her final diagnosis.   She has no family history of breast cancer. She has personal history of cervical cancer. She started her menses at age 57. She hs G6P2(4 miscarriages). She has had a hysterectomy for vaginal bleeding.        Past Medical History:  Diagnosis Date  . Cancer ([place])    cervical - 20 yrs ago  . Hypertension   . Hypokalemia    tx w/ k-Dur  . Seasonal allergies    tx with benadryl prn  . SVD (spontaneous vaginal delivery)    x 2         Past Surgical History:  Procedure Laterality Date  . ABDOMINAL HYSTERECTOMY    . CO2 Laser of Cervix     for cervical cancer 20 yrs ago  . DILATION AND CURETTAGE OF UTERUS  12/2010   polyp removed  . DILATION AND CURETTAGE OF UTERUS     x 3 for MAB  . LAPAROSCOPIC ASSISTED VAGINAL HYSTERECTOMY  06/09/2012   Procedure: LAPAROSCOPIC ASSISTED VAGINAL HYSTERECTOMY;  Surgeon: Gus Height, MD;  Location: [facility];  Service: Gynecology;  Laterality: N/A;  . SALPINGOOPHORECTOMY  06/09/2012   Procedure: SALPINGO OOPHERECTOMY;  Surgeon: Gus Height, MD;  Location: [facility];  Service: Gynecology;  Laterality: Bilateral;  . TUBAL LIGATION           Family History   Problem Relation Age of Onset  . Cancer Mother        lung  . Heart disease Mother   . Heart disease Father     Social History        Tobacco Use  . Smoking status: Former Smoker    Packs/day: 1.00    Years: 16.00    Pack years: 16.00    Types: Cigarettes    Start date: 05/30/1994    Quit date: 01/31/2020    Years since quitting: 0.4  . Smokeless tobacco: Never Used  Substance Use Topics  . Alcohol use: No  . Drug use: No    Medications: I have reviewed the patient's current medications. Allergies as of 06/29/2020   No Known Allergies        Medication List       Accurate as of June 29, 2020  2:55 PM. If you have any questions, ask your nurse or doctor.        albuterol 108 (90 Base) MCG/ACT inhaler Commonly known as: VENTOLIN HFA TAKE 2 PUFFS BY MOUTH EVERY 6 HOURS AS NEEDED FOR WHEEZE OR SHORTNESS OF BREATH   atorvastatin 20 MG tablet Commonly known as: LIPITOR Take 1 tablet (20 mg total) by mouth daily.   diphenhydrAMINE 25  mg capsule Commonly known as: BENADRYL Take 25 mg by mouth every 6 (six) hours as needed for allergies.   ibuprofen 200 MG tablet Commonly known as: ADVIL Take 600 mg by mouth every 6 (six) hours as needed for moderate pain.   lisinopril 5 MG tablet Commonly known as: ZESTRIL Take 1 tablet (5 mg total) by mouth daily.   NEXIUM PO Take by mouth. otc   sertraline 50 MG tablet Commonly known as: ZOLOFT TAKE 1 TABLET BY MOUTH EVERY DAY        ROS:  A comprehensive review of systems was negative except for: Cardiovascular: positive for HTN Gastrointestinal: positive for reflux symptoms Integument/breast: positive for breast lump  Blood pressure 137/82, pulse 90, temperature (!) 97 F (36.1 C), temperature source Temporal, resp. rate 18, height _0  (1.448 m), weight 164 lb (74.4 kg), SpO2 96 %. Physical Exam Vitals reviewed. Exam conducted with a chaperone present.  Constitutional:       Appearance: Normal appearance.  HENT:     Head: Normocephalic and atraumatic.     Nose: Nose normal.     Mouth/Throat:     Mouth: Mucous membranes are moist.  Eyes:     Extraocular Movements: Extraocular movements intact.     Pupils: Pupils are equal, round, and reactive to light.  Cardiovascular:     Rate and Rhythm: Normal rate and regular rhythm.  Pulmonary:     Effort: Pulmonary effort is normal.     Breath sounds: Normal breath sounds.  Chest:     Breasts:        Right: No inverted nipple, mass, nipple discharge or skin change.        Left: Mass present. No nipple discharge.     Comments: Left breast mass at 12 oclock just superior to nipple, 3cm in size, Left sided retracted nipple, no discharge, bruising from biopsy, full axilla and difficult to appreciate adenopathy Abdominal:     General: There is no distension.     Palpations: Abdomen is soft.     Tenderness: There is no abdominal tenderness.  Musculoskeletal:        General: No swelling. Normal range of motion.     Cervical back: Normal range of motion. No rigidity.  Lymphadenopathy:     Upper Body:     Right upper body: No supraclavicular or axillary adenopathy.     Left upper body: No supraclavicular adenopathy.  Skin:    General: Skin is warm and dry.  Neurological:     General: No focal deficit present.     Mental Status: She is alert and oriented to person, place, and time.  Psychiatric:        Mood and Affect: Mood normal.        Behavior: Behavior normal.        Thought Content: Thought content normal.        Judgment: Judgment normal.     Results: CLINICAL DATA: Post ultrasound-guided biopsy of a palpable mass in the left breast at 12 o'clock retroareolar and ultrasound-guided biopsy of the larger morphologically abnormal lymph node in the left axilla.  EXAM: DIAGNOSTIC LEFT MAMMOGRAM POST ULTRASOUND BIOPSY  COMPARISON: Previous exams.  FINDINGS: Mammographic images were obtained  following ultrasound-guided biopsy of a palpable mass in the left breast at 12 o'clock retroareolar and ultrasound-guided biopsy of the larger morphologically abnormal lymph node in the left axilla. A ribbon shaped biopsy marking clip is present at the site of the biopsied mass  in the left breast at 12 o'clock retroareolar. A spiral shaped HydroMARK clip is present at the site of the biopsied enlarged lymph node in the left axilla.  IMPRESSION: 1. Ribbon shaped biopsy marking clip at site of biopsied mass in the left breast at 12 o'clock retroareolar.  2. Spiral shaped HydroMARK clip at site of biopsied lymph node in the left axilla.  Final Assessment: Post Procedure Mammograms for Marker Placement  ADDENDUM REPORT: 06/21/2020 12:57  ADDENDUM: PATHOLOGY revealed:  A. BREAST, 12:00, LEFT, BIOPSY: - Invasive Ductal Carcinoma, grade 2/3. - Lymphovascular space involvement. - See comment.  B. LYMPH NODE, AXILLA, LEFT, BIOPSY: - Ductal carcinoma consistent with metastasis.  COMMENT: The greatest dimension in a single core is 1.3 cm. Breast prognostic profile will be performed.  Pathology results are CONCORDANT with imaging findings, per Dr. Everlean Alstrom.  Pathology results and recommendations below were discussed with patient by telephone on 06/21/2020. Patient reported biopsy site doing well with slight tenderness at the site. Post biopsy care instructions were reviewed and questions were answered. Patient was instructed to call [facility] Mammography Department if any concerns or questions arise related to the biopsy.  Recommendation: Surgical referral. Request for surgical referral was relayed to Kathi Der RT at [facility] Mammography Department by Electa Sniff RN on 06/21/2020.  Addendum by Electa Sniff RN on 06/21/2020.   Electronically Signed By: Everlean Alstrom M.D. On: 06/21/2020 12:57  Electronically  Signed By: Everlean Alstrom M.D. On: 06/20/2020 13:42   CLINICAL DATA: 54 year old patient presents for evaluation of a palpable retroareolar left breast mass and left nipple inversion. She denies any nipple discharge.  EXAM: DIGITAL DIAGNOSTIC BILATERAL MAMMOGRAM WITH CAD AND TOMO  ULTRASOUND LEFT BREAST  COMPARISON: Previous exam(s).  ACR Breast Density Category c: The breast tissue is heterogeneously dense, which may obscure small masses.  FINDINGS: There is a new irregular mass in the retroareolar 12 o'clock/central left breast, anterior third, corresponding to the palpable lump. There is associated left nipple retraction. Stable densely calcified degenerating fibroadenoma just lateral to the palpable mass.  Two suspicious left axillary lymph nodes, largest measuring approximately 2 cm, but incompletely imaged.  No mass, architectural distortion, or suspicious microcalcification is identified in the right breast to suggest malignancy.  Mammographic images were processed with CAD.  On physical exam, there is a firm palpable 3 cm mass in the 12 o'clock retroareolar left breast. There is left nipple retraction. The skin of the left breast appears normal. I do not palpate a mass in the left axilla.  Targeted ultrasound is performed, showing heterogeneously hypoechoic irregular mass centered at 12 o'clock retroareolar. Mass measures 2.7 x 1.6 x 2.2 cm. (Please note that on the captured images show the largest measurement is 2.4 cm, but the largest portion of the mass is felt to best most accurately measured as 2.7 cm). Just lateral to the mass is a calcified hypoechoic nodule that corresponds to densely calcified degenerating fibroadenoma in the outer retroareolar left breast on mammography.  Ultrasound the left axilla shows two suspicious lymph nodes, correlating with the findings on the mammogram. Largest lymph node measures 1.2 x 2.2 cm in the  transverse axis. There is marked cortical thickening, with obliteration of the majority of the hilum. Immediately adjacent to this is a smaller lymph node measuring 0.8 x 0.7 x 0.7 cm, with diffuse mild cortical thickening and partial obliteration of the hilum. No additional suspicious lymph nodes identified.  IMPRESSION: Highly suspicious palpable  left breast mass 12 o'clock retroareolar region measures 2.7 cm greatest diameter on ultrasound.  Two left axillary lymph nodes are suspicious for metastatic involvement.  No evidence of malignancy in the right breast.  RECOMMENDATION: Two ultrasound-guided core needle biopsies are recommended, including the palpable mass in the retroareolar 12 o'clock region and the larger of the two suspicious left axillary lymph nodes. Biopsies are being scheduled for the patient.  I have discussed the findings and recommendations with the patient. If applicable, a reminder letter will be sent to the patient regarding the next appointment.  BI-RADS CATEGORY 5: Highly suggestive of malignancy.   Electronically Signed By: Curlene Dolphin M.D. On: 06/13/2020 11:22  SURGICAL PATHOLOGY  * THIS IS AN ADDENDUM REPORT * CASE: [id number]  PATIENT: Mahala Menghini  Surgical Pathology Report  *Addendum *   Reason for Addendum #1: Breast Biomarker Results  Reason for Addendum #2: Molecular Genetic Test Results, FISH   Clinical History: retroareolar mass   FINAL MICROSCOPIC DIAGNOSIS:   A. BREAST, 12:00, LEFT, BIOPSY:  - Invasive ductal carcinoma, grade 2/3.  - Lymphovascular space involvement.  - See comment.   B. LYMPH NODE, AXILLA, LEFT, BIOPSY:  - Ductal carcinoma consistent with metastasis.   COMMENT:   A. The greatest dimension in a single core is 1.3 cm. Breast prognostic  profile will be performed.   Dr. Vic Ripper agrees.   Call to the breast center of [facility] on 06/21/2020.   GROSS DESCRIPTION:  A: The  specimen is received in formalin labeled with the patient's name  and left breast 12:00, and consists of 3 cores of tan-white fibroadipose  tissue, ranging from 1.5 x 0.2 x 0.2 cm to 2.0 x 0.2 x 0.2 cm. The  specimen is entirely submitted in 1 cassette.  Time in formalin 1:15 PM on 06/20/2020. Cold ischemic time less than 1  minute (per order in EPIC)  B: The specimen is received in formalin labeled with the patient's name  and left axilla, and consists of 4 cores of tan-white fibroadipose  tissue, ranging from 0.9 x 0.2 x 0.1 cm to 1.5 x 0.2 x 0.1 cm. The  specimen is entirely submitted in 1 cassette.  Time in formalin 1:25 PM on 06/20/2020. Cold ischemic time less than 1  minute (per order in EPIC) Craig Staggers 06/26/2020) is a pre-existing evaluation  okay has a hepatitis picture and has been stated for the last couple of  months- Malignant cells consistent with small cell carcinoma of the  benign- Tubular adenoma.  - No high-grade dysplasia or carcinoma. Pregnancy mentioned and stated  for a couple of months-  With reactive atypia but not convincing to me were new in the left  breast Dr. I agree with exactly what she said is mostly benign there is some  atypia and in that area no way to be sure but I would favor less  reactive. Not enough for malignancy [ Suspicious the case that Dr. Melina Copa said I can get antibiotics okay for  me to case is a pre-existing evaluation okay   Final Diagnosis performed by Claudette Laws, MD.  Electronically signed  06/21/2020   ADDENDUM:   PROGNOSTIC INDICATOR RESULTS:   Immunohistochemical and morphometric analysis performed manually   The tumor cells are EQUIVOCAL for Her2 (2+). HER-2 by FISH is pending  and will be reported in an addendum.   Estrogen Receptor: NEGATIVE  Progesterone Receptor: NEGATIVE  Proliferation Marker Ki-67: 15%   Comment: The negative hormone receptor study(ies) in the  case has an  internal positive control.   Reference  Range Estrogen and Progesterone Receptor    Negative 0%    Positive >1%   All controls stained appropriately.   ADDENDUM:   FLOURESCENCE IN-SITU HYBRIDIZATION RESULTS:   GROUP 5:  HER2 **NEGATIVE**   On the tissue sample received from this individual HER2 FISH was  performed by a technologist and cell imaging and analysis on the  [employer].   RATIO of HER2/CEN 17 SIGNALS: 0.47  AVERAGE HER2 COPY NUMBER PER CELL: 2.85   The ratio of HER-2 CEP 17 is within the range < 2.0 of HER-2: CEP 17 in  normal breast tissue and a copy number of HER2 signals per cell is <4.0.   Arch Pathol Lab Med 1:1,2018.    Assessment & Plan:  ADAMARIZ GILLOTT is a 54 y.o. female with a triple negative breast cancer with positive lymph nodes by US biopsy.  She will need neoadjuvant therapy. Discussed breast cancer and treatments. Discussed port placement and risk of bleeding, infection, pneumothorax, malfunction. Discussed that we would plan for this on the right side. Discussed she would undergo further workup with Oncology and we would time Port around their work up/ imaging.  Discussed surgical planning following her neoadjuvant therapy.   -Port placement in the upcoming weeks  -Oncology referral made and patient has appt 07/05/2020         Future Appointments  Date Time Provider [place]  07/05/2020  1:30 PM Derek Jack, MD [facility] None  08/15/2020 10:00 AM [facility] NURSE [facility] RGA     All questions were answered to the satisfaction of the patient and friend who accompanied her today.   Virl Cagey 06/29/2020, 2:55 PM

## 2020-07-11 ENCOUNTER — Inpatient Hospital Stay (HOSPITAL_COMMUNITY): Payer: BC Managed Care – PPO | Admitting: General Practice

## 2020-07-11 DIAGNOSIS — C50919 Malignant neoplasm of unspecified site of unspecified female breast: Secondary | ICD-10-CM

## 2020-07-11 NOTE — Progress Notes (Signed)
[place] Initial Psychosocial Assessment Clinical Social Work  Clinical Social Work contacted by phone to assess psychosocial, emotional, mental health, and spiritual needs of the patient.   Barriers to care/review of distress screen:  - Transportation:  Do you anticipate any problems getting to appointments?  Do you have someone who can help run errands for you if you need it?  Not now, husband works second shift, is hoping for AM appointments so he can help drive her.  She will drive herself if possible, if needs husband, he will be able to drive if appointments are in the morning.   - Help at home:  What is your living situation (alone, family, other)?  If you are physically unable to care for yourself, who would you call on to help you?  Lives w husband, no other help available.   - Support system:  What does your support system look like?  Who would you call on if you needed some kind of practical help?  What if you needed someone to talk to for emotional support?  Has family, but most are working.  Co workers are supportive.  Has two sisters who are supportive emotionally - "I can always call them and talk to them."   - Finances:  Are you concerned about finances.  Considering returning to work?  If not, applying for disability?  Is on short term disability leave from her job, what she receives is significantly less than she is used to.  Is on leave from [employer] ([place]), was processor for returns department.  Has to lift boxes and do other physical work in the context of her job.  Husband is able to continue to work at this time, his income is stable.  She is still paying for her employer based health insurance while on disability.  She is stressed due to reduction in her income while out of work on disability.  What is your understanding of where you are with your cancer? Its cause?  Your treatment plan and what happens next?  Newly diagnosed with Stage 2 B breast cancer, triple negative.  Will have  chemotherapy, then surgery, then possibly additional treatment after surgery.  Found lump on breast.  Had cervical cancer in her 32s, was treated w laser surgery.  Was startling when she found the lump two months ago.  "Its going good so far, I am not sure exactly what is being done w the treatment."  Unsure if chemo and radiation will be done at the same time.  Both parents have had cancer, "I am just used to it I guess."  "I am always the one who helps everyone else."  Aware she will need to adjust to accepting help from others.   What are your worries for the future as you begin treatment for cancer?  Uncertainty about appointments/plans, but in general is feeling optimistic about the process.    What are your hopes and priorities during your treatment? What is important to you? What are your goals for your care?  "My grandkids, ages 43 and 19."  She has them every other weekend.  52 year old is aware she has cancer, "I think he is scared, but I think he is handling it pretty good."  Has daughter who lives in [place], son lives out of town.    CSW Summary:  Patient and family psychosocial functioning including strengths, limitations, and coping skills:  Newly diagnosed 54 year old female, diagnosed w Stage 2B breast cancer  per patient.  She is in the process of completing testing prior to meeting w oncologist to firm up her treatment plan.  Out of work at this time due to diagnosis, multiple appointments and upcoming treatments.  This has resulted in a reduction in household income and increased medical bills.  Finances are her major stressor at this time.  She has two children, two grandchildren, extended family and supportive co workers and friends.  During the day, she has less support as all of her support people are working - she is hopeful that her appointments can be scheduled in the mornings so that her husband, who works second shift in [place], can help w transportation if she cannot  drive herself.  She has a matter of fact approach to her diagnosis - both of her parents were treated for cancer so she feels she "knows what to expect" in terms of treatment.  She is unsure of the specifics of her own treatment - this will be firmed up when she meet w her oncologist in late July.  CSW and patient discussed common feeling and emotions when being diagnosed with cancer, and the importance of support during treatment. CSW informed patient of the support team and support services at [facility]. CSW provided contact information and encouraged patient to call with any questions or concerns.  Identifications of barriers to care:  Financial stress, lack of transportation possibly if husband is unable to drive  Availability of community resources:  [place], [place], [place], [employer], local food banks/pantries  Clinical Social Worker follow up needed: Yes.    Will help complete applications for financial assistance.  These will be left for her to pick up at [facility] front desk.  Appt scheduled to discuss on phone next Tuesday July 20.    Edwyna Shell, LCSW Clinical Social Worker Phone:  [phone] Cell:  [phone]

## 2020-07-12 ENCOUNTER — Other Ambulatory Visit: Payer: Self-pay

## 2020-07-12 ENCOUNTER — Ambulatory Visit (HOSPITAL_COMMUNITY)
Admission: RE | Admit: 2020-07-12 | Discharge: 2020-07-12 | Disposition: A | Discharge status: Home / Self Care | Payer: BC Managed Care – PPO | Source: Ambulatory Visit | Attending: Hematology | Admitting: Hematology

## 2020-07-12 DIAGNOSIS — C50912 Malignant neoplasm of unspecified site of left female breast: Secondary | ICD-10-CM | POA: Diagnosis not present

## 2020-07-12 DIAGNOSIS — Z0189 Encounter for other specified special examinations: Secondary | ICD-10-CM

## 2020-07-12 NOTE — Progress Notes (Signed)
*  PRELIMINARY RESULTS* Echocardiogram 2D Echocardiogram has been performed.  Samuel Germany 07/12/2020, 2:37 PM

## 2020-07-13 ENCOUNTER — Encounter (HOSPITAL_COMMUNITY): Payer: Self-pay

## 2020-07-13 ENCOUNTER — Encounter (HOSPITAL_COMMUNITY)
Admission: RE | Admit: 2020-07-13 | Discharge: 2020-07-13 | Disposition: A | Discharge status: Home / Self Care | Payer: BC Managed Care – PPO | Source: Ambulatory Visit | Attending: General Surgery | Admitting: General Surgery

## 2020-07-13 ENCOUNTER — Other Ambulatory Visit: Payer: Self-pay

## 2020-07-13 HISTORY — DX: Depression, unspecified: F32.A

## 2020-07-13 HISTORY — DX: Unspecified asthma, uncomplicated: J45.909

## 2020-07-14 ENCOUNTER — Ambulatory Visit (HOSPITAL_COMMUNITY)
Admission: RE | Admit: 2020-07-14 | Discharge: 2020-07-14 | Disposition: A | Discharge status: Home / Self Care | Payer: BC Managed Care – PPO | Source: Ambulatory Visit | Attending: Hematology | Admitting: Hematology

## 2020-07-14 ENCOUNTER — Other Ambulatory Visit (HOSPITAL_COMMUNITY)
Admission: RE | Admit: 2020-07-14 | Discharge: 2020-07-14 | Disposition: A | Discharge status: Home / Self Care | Payer: BC Managed Care – PPO | Source: Ambulatory Visit | Attending: General Surgery | Admitting: General Surgery

## 2020-07-14 DIAGNOSIS — Z20822 Contact with and (suspected) exposure to covid-19: Secondary | ICD-10-CM | POA: Insufficient documentation

## 2020-07-14 DIAGNOSIS — C50912 Malignant neoplasm of unspecified site of left female breast: Secondary | ICD-10-CM

## 2020-07-14 MED ORDER — IOHEXOL 300 MG/ML  SOLN
100.0000 mL | Freq: Once | INTRAMUSCULAR | Status: AC | PRN
  Administered 2020-07-14: 100 mL via INTRAVENOUS

## 2020-07-15 LAB — SARS CORONAVIRUS 2 (TAT 6-24 HRS): SARS Coronavirus 2: NEGATIVE

## 2020-07-17 ENCOUNTER — Ambulatory Visit (HOSPITAL_COMMUNITY): Payer: BC Managed Care – PPO | Admitting: Anesthesiology

## 2020-07-17 ENCOUNTER — Ambulatory Visit (HOSPITAL_COMMUNITY)
Admission: RE | Admit: 2020-07-17 | Discharge: 2020-07-17 | Disposition: A | Discharge status: Home / Self Care | Payer: BC Managed Care – PPO | Source: Ambulatory Visit | Attending: General Surgery | Admitting: General Surgery

## 2020-07-17 ENCOUNTER — Ambulatory Visit (HOSPITAL_COMMUNITY): Payer: BC Managed Care – PPO

## 2020-07-17 ENCOUNTER — Encounter (HOSPITAL_COMMUNITY): Payer: Self-pay | Admitting: General Surgery

## 2020-07-17 ENCOUNTER — Encounter (HOSPITAL_COMMUNITY)
Admission: RE | Disposition: A | Discharge status: Home / Self Care | Payer: Self-pay | Source: Ambulatory Visit | Attending: General Surgery

## 2020-07-17 DIAGNOSIS — C50412 Malignant neoplasm of upper-outer quadrant of left female breast: Secondary | ICD-10-CM | POA: Diagnosis not present

## 2020-07-17 DIAGNOSIS — Z95828 Presence of other vascular implants and grafts: Secondary | ICD-10-CM

## 2020-07-17 DIAGNOSIS — R599 Enlarged lymph nodes, unspecified: Secondary | ICD-10-CM | POA: Diagnosis not present

## 2020-07-17 DIAGNOSIS — Z8541 Personal history of malignant neoplasm of cervix uteri: Secondary | ICD-10-CM | POA: Diagnosis not present

## 2020-07-17 DIAGNOSIS — Z17421 Hormone receptor negative with human epidermal growth factor receptor 2 negative status: Secondary | ICD-10-CM

## 2020-07-17 DIAGNOSIS — C50919 Malignant neoplasm of unspecified site of unspecified female breast: Secondary | ICD-10-CM

## 2020-07-17 DIAGNOSIS — Z79899 Other long term (current) drug therapy: Secondary | ICD-10-CM | POA: Insufficient documentation

## 2020-07-17 DIAGNOSIS — I10 Essential (primary) hypertension: Secondary | ICD-10-CM | POA: Insufficient documentation

## 2020-07-17 DIAGNOSIS — Z87891 Personal history of nicotine dependence: Secondary | ICD-10-CM | POA: Insufficient documentation

## 2020-07-17 HISTORY — PX: PORTACATH PLACEMENT: SHX2246

## 2020-07-17 SURGERY — INSERTION, TUNNELED CENTRAL VENOUS DEVICE, WITH PORT
Anesthesia: General | Site: Chest | Laterality: Right

## 2020-07-17 MED ORDER — CEFAZOLIN SODIUM-DEXTROSE 2-4 GM/100ML-% IV SOLN
2.0000 g | INTRAVENOUS | Status: AC
  Administered 2020-07-17: 2 g via INTRAVENOUS

## 2020-07-17 MED ORDER — EPHEDRINE SULFATE 50 MG/ML IJ SOLN
INTRAMUSCULAR | Status: DC | PRN
  Administered 2020-07-17 (×2): 10 mg via INTRAVENOUS

## 2020-07-17 MED ORDER — PROPOFOL 500 MG/50ML IV EMUL
INTRAVENOUS | Status: DC | PRN
  Administered 2020-07-17: 150 ug/kg/min via INTRAVENOUS
  Administered 2020-07-17: 250 mg via INTRAVENOUS

## 2020-07-17 MED ORDER — MIDAZOLAM HCL 2 MG/2ML IJ SOLN
INTRAMUSCULAR | Status: AC
  Filled 2020-07-17: qty 2

## 2020-07-17 MED ORDER — CHLORHEXIDINE GLUCONATE CLOTH 2 % EX PADS: 6.0000 | MEDICATED_PAD | Freq: Once | CUTANEOUS | Status: DC

## 2020-07-17 MED ORDER — MEPERIDINE HCL 50 MG/ML IJ SOLN: 6.2500 mg | INTRAMUSCULAR | Status: DC | PRN

## 2020-07-17 MED ORDER — ORAL CARE MOUTH RINSE: 15.0000 mL | Freq: Once | OROMUCOSAL | Status: AC

## 2020-07-17 MED ORDER — HYDROCODONE-ACETAMINOPHEN 5-325 MG PO TABS: 1.0000 | ORAL_TABLET | ORAL | 0 refills | Status: DC | PRN

## 2020-07-17 MED ORDER — HEPARIN SOD (PORK) LOCK FLUSH 100 UNIT/ML IV SOLN
INTRAVENOUS | Status: DC | PRN
  Administered 2020-07-17: 500 [IU] via INTRAVENOUS

## 2020-07-17 MED ORDER — MIDAZOLAM HCL 5 MG/5ML IJ SOLN
INTRAMUSCULAR | Status: DC | PRN
  Administered 2020-07-17: 2 mg via INTRAVENOUS

## 2020-07-17 MED ORDER — FENTANYL CITRATE (PF) 100 MCG/2ML IJ SOLN
INTRAMUSCULAR | Status: AC
  Filled 2020-07-17: qty 2

## 2020-07-17 MED ORDER — LIDOCAINE HCL (PF) 1 % IJ SOLN
INTRAMUSCULAR | Status: DC | PRN
  Administered 2020-07-17: 10 mL

## 2020-07-17 MED ORDER — SODIUM CHLORIDE (PF) 0.9 % IJ SOLN
INTRAMUSCULAR | Status: DC | PRN
  Administered 2020-07-17: 10 mL via INTRAVENOUS

## 2020-07-17 MED ORDER — FENTANYL CITRATE (PF) 100 MCG/2ML IJ SOLN
INTRAMUSCULAR | Status: DC | PRN
  Administered 2020-07-17 (×2): 50 ug via INTRAVENOUS

## 2020-07-17 MED ORDER — CEFAZOLIN SODIUM-DEXTROSE 2-4 GM/100ML-% IV SOLN
INTRAVENOUS | Status: AC
  Filled 2020-07-17: qty 100

## 2020-07-17 MED ORDER — HYDROMORPHONE HCL 1 MG/ML IJ SOLN: 0.2500 mg | INTRAMUSCULAR | Status: DC | PRN

## 2020-07-17 MED ORDER — PROMETHAZINE HCL 25 MG/ML IJ SOLN: 6.2500 mg | INTRAMUSCULAR | Status: DC | PRN

## 2020-07-17 MED ORDER — CHLORHEXIDINE GLUCONATE 0.12 % MT SOLN
15.0000 mL | Freq: Once | OROMUCOSAL | Status: AC
  Administered 2020-07-17: 15 mL via OROMUCOSAL

## 2020-07-17 MED ORDER — HEPARIN SOD (PORK) LOCK FLUSH 100 UNIT/ML IV SOLN
INTRAVENOUS | Status: AC
  Filled 2020-07-17: qty 5

## 2020-07-17 MED ORDER — LIDOCAINE HCL (PF) 1 % IJ SOLN
INTRAMUSCULAR | Status: AC
  Filled 2020-07-17: qty 30

## 2020-07-17 MED ORDER — LACTATED RINGERS IV SOLN: INTRAVENOUS | Status: DC | PRN

## 2020-07-17 MED ORDER — LACTATED RINGERS IV SOLN
Freq: Once | INTRAVENOUS | Status: AC
  Administered 2020-07-17: 1000 mL via INTRAVENOUS

## 2020-07-17 SURGICAL SUPPLY — 35 items
APPLICATOR CHLORAPREP 10.5 ORG (MISCELLANEOUS) ×3 IMPLANT
APPLIER CLIP 9.375 SM OPEN (CLIP)
BAG DECANTER FOR FLEXI CONT (MISCELLANEOUS) ×3 IMPLANT
CLIP APPLIE 9.375 SM OPEN (CLIP) IMPLANT
CLOTH BEACON ORANGE TIMEOUT ST (SAFETY) ×3 IMPLANT
COVER LIGHT HANDLE STERIS (MISCELLANEOUS) ×6 IMPLANT
COVER WAND RF STERILE (DRAPES) ×3 IMPLANT
DECANTER SPIKE VIAL GLASS SM (MISCELLANEOUS) ×3 IMPLANT
DERMABOND ADVANCED (GAUZE/BANDAGES/DRESSINGS) ×2
DERMABOND ADVANCED .7 DNX12 (GAUZE/BANDAGES/DRESSINGS) ×1 IMPLANT
DRAPE C-ARM FOLDED MOBILE STRL (DRAPES) ×3 IMPLANT
ELECT REM PT RETURN 9FT ADLT (ELECTROSURGICAL) ×3
ELECTRODE REM PT RTRN 9FT ADLT (ELECTROSURGICAL) ×1 IMPLANT
GLOVE BIO SURGEON STRL SZ 6.5 (GLOVE) ×4 IMPLANT
GLOVE BIO SURGEONS STRL SZ 6.5 (GLOVE) ×2
GLOVE BIOGEL PI IND STRL 6.5 (GLOVE) ×1 IMPLANT
GLOVE BIOGEL PI IND STRL 7.0 (GLOVE) ×1 IMPLANT
GLOVE BIOGEL PI INDICATOR 6.5 (GLOVE) ×2
GLOVE BIOGEL PI INDICATOR 7.0 (GLOVE) ×2
GOWN STRL REUS W/TWL LRG LVL3 (GOWN DISPOSABLE) ×6 IMPLANT
IV NS 500ML (IV SOLUTION) ×3
IV NS 500ML BAXH (IV SOLUTION) ×1 IMPLANT
KIT PORT POWER 8FR ISP MRI (Port) ×3 IMPLANT
KIT TURNOVER KIT A (KITS) ×3 IMPLANT
MANIFOLD NEPTUNE II (INSTRUMENTS) ×3 IMPLANT
NEEDLE HYPO 25X1 1.5 SAFETY (NEEDLE) ×3 IMPLANT
PACK MINOR (CUSTOM PROCEDURE TRAY) ×3 IMPLANT
PAD ARMBOARD 7.5X6 YLW CONV (MISCELLANEOUS) ×3 IMPLANT
SET BASIN LINEN APH (SET/KITS/TRAYS/PACK) ×3 IMPLANT
SUT MNCRL AB 4-0 PS2 18 (SUTURE) ×3 IMPLANT
SUT PROLENE 2 0 SH 30 (SUTURE) ×3 IMPLANT
SUT VIC AB 3-0 SH 27 (SUTURE) ×3
SUT VIC AB 3-0 SH 27X BRD (SUTURE) ×1 IMPLANT
SYR 10ML LL (SYRINGE) ×6 IMPLANT
SYR CONTROL 10ML LL (SYRINGE) ×3 IMPLANT

## 2020-07-17 NOTE — Anesthesia Postprocedure Evaluation (Signed)
Anesthesia Post Note  Patient: Elizabeth Graves  Procedure(s) Performed: INSERTION PORT-A-CATH (Right Chest)  Patient location during evaluation: PACU Anesthesia Type: General Level of consciousness: awake, oriented, awake and alert and patient cooperative Pain management: pain level controlled Vital Signs Assessment: post-procedure vital signs reviewed and stable Respiratory status: spontaneous breathing, respiratory function stable, nonlabored ventilation and patient connected to nasal cannula oxygen Cardiovascular status: blood pressure returned to baseline and stable Postop Assessment: no headache and no backache Anesthetic complications: no   No complications documented.   Last Vitals:  Vitals:   07/17/20 0915 07/17/20 1015  BP: 113/71 (P) 101/60  Pulse: 74 82  Resp: 14 17  Temp:  (P) 36.6 C  SpO2: 95% 93%    Last Pain:  Vitals:   07/17/20 1015  TempSrc:   PainSc: (P) Asleep                 Tacy Learn

## 2020-07-17 NOTE — Progress Notes (Signed)
[facility]  CXR with port in good place. Talked to husband and notified him that everything completed.  Curlene Labrum, MD [facility] [address] [place], [place] [address] [phone] (office)

## 2020-07-17 NOTE — Discharge Instructions (Signed)
Keep area clean and dry. You can take a shower in 24 hours. Do not submerge in water.  Take tylenol and ibuprofen for pain control and Norco for severe pain.    Implanted Port Insertion, Care After This sheet gives you information about how to care for yourself after your procedure. Your health care provider may also give you more specific instructions. If you have problems or questions, contact your health care provider. What can I expect after the procedure? After the procedure, it is common to have:  Discomfort at the port insertion site.  Bruising on the skin over the port. This should improve over 3-4 days. Follow these instructions at home: [facility] care  After your port is placed, you will get a manufacturer's information card. The card has information about your port. Keep this card with you at all times.  Take care of the port as told by your health care provider. Ask your health care provider if you or a family member can get training for taking care of the port at home. A home health care nurse may also take care of the port.  Make sure to remember what type of port you have. Incision care      Follow instructions from your health care provider about how to take care of your port insertion site. Make sure you: ? Wash your hands with soap and water before and after you change your bandage (dressing). If soap and water are not available, use hand sanitizer. ? Change your dressing as told by your health care provider. ? Leave stitches (sutures), skin glue, or adhesive strips in place. These skin closures may need to stay in place for 2 weeks or longer. If adhesive strip edges start to loosen and curl up, you may trim the loose edges. Do not remove adhesive strips completely unless your health care provider tells you to do that.  Check your port insertion site every day for signs of infection. Check for: ? Redness, swelling, or pain. ? Fluid or blood. ? Warmth. ? Pus or a bad  smell. Activity  Return to your normal activities as told by your health care provider. Ask your health care provider what activities are safe for you.  Do not lift anything that is heavier than 10 lb (4.5 kg), or the limit that you are told, until your health care provider says that it is safe. General instructions  Take over-the-counter and prescription medicines only as told by your health care provider.  Do not take baths, swim, or use a hot tub until your health care provider approves. Ask your health care provider if you may take showers. You may only be allowed to take sponge baths.  Do not drive for 24 hours if you were given a sedative during your procedure.  Wear a medical alert bracelet in case of an emergency. This will tell any health care providers that you have a port.  Keep all follow-up visits as told by your health care provider. This is important. Contact a health care provider if:  You cannot flush your port with saline as directed, or you cannot draw blood from the port.  You have a fever or chills.  You have redness, swelling, or pain around your port insertion site.  You have fluid or blood coming from your port insertion site.  Your port insertion site feels warm to the touch.  You have pus or a bad smell coming from the port insertion site. Get help right  away if:  You have chest pain or shortness of breath.  You have bleeding from your port that you cannot control. Summary  Take care of the port as told by your health care provider. Keep the manufacturer's information card with you at all times.  Change your dressing as told by your health care provider.  Contact a health care provider if you have a fever or chills or if you have redness, swelling, or pain around your port insertion site.  Keep all follow-up visits as told by your health care provider. This information is not intended to replace advice given to you by your health care provider.  Make sure you discuss any questions you have with your health care provider. Document Revised: 07/14/2018 Document Reviewed: 07/14/2018 Elsevier Patient Education  [place] Anesthesia, Adult, Care After This sheet gives you information about how to care for yourself after your procedure. Your health care provider may also give you more specific instructions. If you have problems or questions, contact your health care provider. What can I expect after the procedure? After the procedure, the following side effects are common:  Pain or discomfort at the IV site.  Nausea.  Vomiting.  Sore throat.  Trouble concentrating.  Feeling cold or chills.  Weak or tired.  Sleepiness and fatigue.  Soreness and body aches. These side effects can affect parts of the body that were not involved in surgery. Follow these instructions at home:  For at least 24 hours after the procedure:  Have a responsible adult stay with you. It is important to have someone help care for you until you are awake and alert.  Rest as needed.  Do not: ? Participate in activities in which you could fall or become injured. ? Drive. ? Use heavy machinery. ? Drink alcohol. ? Take sleeping pills or medicines that cause drowsiness. ? Make important decisions or sign legal documents. ? Take care of children on your own. Eating and drinking  Follow any instructions from your health care provider about eating or drinking restrictions.  When you feel hungry, start by eating small amounts of foods that are soft and easy to digest (bland), such as toast. Gradually return to your regular diet.  Drink enough fluid to keep your urine pale yellow.  If you vomit, rehydrate by drinking water, juice, or clear broth. General instructions  If you have sleep apnea, surgery and certain medicines can increase your risk for breathing problems. Follow instructions from your health care provider about  wearing your sleep device: ? Anytime you are sleeping, including during daytime naps. ? While taking prescription pain medicines, sleeping medicines, or medicines that make you drowsy.  Return to your normal activities as told by your health care provider. Ask your health care provider what activities are safe for you.  Take over-the-counter and prescription medicines only as told by your health care provider.  If you smoke, do not smoke without supervision.  Keep all follow-up visits as told by your health care provider. This is important. Contact a health care provider if:  You have nausea or vomiting that does not get better with medicine.  You cannot eat or drink without vomiting.  You have pain that does not get better with medicine.  You are unable to pass urine.  You develop a skin rash.  You have a fever.  You have redness around your IV site that gets worse. Get help right away if:  You have  difficulty breathing.  You have chest pain.  You have blood in your urine or stool, or you vomit blood. Summary  After the procedure, it is common to have a sore throat or nausea. It is also common to feel tired.  Have a responsible adult stay with you for the first 24 hours after general anesthesia. It is important to have someone help care for you until you are awake and alert.  When you feel hungry, start by eating small amounts of foods that are soft and easy to digest (bland), such as toast. Gradually return to your regular diet.  Drink enough fluid to keep your urine pale yellow.  Return to your normal activities as told by your health care provider. Ask your health care provider what activities are safe for you. This information is not intended to replace advice given to you by your health care provider. Make sure you discuss any questions you have with your health care provider. Document Revised: 12/19/2017 Document Reviewed: 08/01/2017 Elsevier Patient Education   [place].

## 2020-07-17 NOTE — Interval H&P Note (Signed)
History and Physical Interval Note:  07/17/2020 8:55 AM  Elizabeth Graves  has presented today for surgery, with the diagnosis of Left metatastic breast cancer.  The various methods of treatment have been discussed with the patient and family. After consideration of risks, benefits and other options for treatment, the patient has consented to  Procedure(s): INSERTION PORT-A-CATH (Right) as a surgical intervention.  The patient's history has been reviewed, patient examined, no change in status, stable for surgery.  I have reviewed the patient's chart and labs.  Questions were answered to the patient's satisfaction.     No changes and no questions.   Virl Cagey

## 2020-07-17 NOTE — Transfer of Care (Signed)
Immediate Anesthesia Transfer of Care Note  Patient: Elizabeth Graves  Procedure(s) Performed: INSERTION PORT-A-CATH (Right Chest)  Patient Location: PACU  Anesthesia Type:General  Level of Consciousness: awake, alert , oriented and patient cooperative  Airway & Oxygen Therapy: Patient Spontanous Breathing and Patient connected to nasal cannula oxygen  Post-op Assessment: Report given to RN, Post -op Vital signs reviewed and stable and Patient moving all extremities  Post vital signs: Reviewed and stable  Last Vitals:  Vitals Value Taken Time  BP 101/60 07/17/20 1016  Temp    Pulse 74 07/17/20 1019  Resp 14 07/17/20 1019  SpO2 92 % 07/17/20 1019  Vitals shown include unvalidated device data.  Last Pain:  Vitals:   07/17/20 1015  TempSrc:   PainSc: (P) Asleep      Patients Stated Pain Goal: 8 (61/48/30 7354)  Complications: No complications documented.

## 2020-07-17 NOTE — Op Note (Signed)
Operative Note 07/17/20   Preoperative Diagnosis: Metastatic Triple Negative Breast Cancer    Postoperative Diagnosis: Same   Procedure(s) Performed: Port-A-Cath placement, right subclavian   Surgeon: Lanell Matar. Constance Haw, MD   Assistants: No qualified resident was available   Anesthesia: General anesthesia with LMA   Anesthesiologist: Dr. Charna Elizabeth    Specimens: None   Estimated Blood Loss: Minimal   Fluoroscopy time: 18 seconds   Blood Replacement: None    Complications: None    Operative Findings: Normal anatomoy  Indications: Ms. Nordmann is a 54 yo with a newly diagnosed triple negative breast cancer with metastatic disease to the lymph nodes.   Procedure: The patient was brought into the operating room and general anesthesia care was induced.  The right chest and neck was prepped and draped in the usual sterile fashion.  Preoperative antibiotics were given.  An incision was made below the right clavicle. A subcutaneous pocket was formed. The needles advanced into the right subclavian vein using the Seldinger technique without difficulty. A guidewire was then advanced into the right atrium under fluoroscopic guidance.  Ectopia was noted and the wire was withdrawn slightly. An introducer and peel-away sheath were placed over the guidewire. The catheter was then inserted through the peel-away sheath and the peel-away sheath was removed.  A spot film was performed to confirm the position. The catheter was then attached to the port and the port placed in subcutaneous pocket. Adequate positioning was confirmed by fluoroscopy. Hemostasis was confirmed, and the port was secured with 2-0 prolene sutures.  Good backflow of blood was noted on aspiration of the port. The port was flushed with heparin flush. One percent lidocaine was used for local anesthesia.   Subcutaneous layer was reapproximated using a 3-0 Vicryl interrupted suture. The skin was closed using a 4-0 Vicryl subcuticular suture.  Dermabond was applied.    All tape and needle counts were correct at the end of the procedure. The patient was transferred to PACU in stable condition. A chest x-ray will be performed at that time.  Curlene Labrum, MD [facility] [address] [place], [place] [address] [phone] (office)

## 2020-07-17 NOTE — Anesthesia Preprocedure Evaluation (Signed)
Anesthesia Evaluation  Patient identified by MRN, date of birth, ID band Patient awake    Reviewed: Allergy & Precautions, NPO status , Patient's Chart, lab work & pertinent test results  Airway Mallampati: II  TM Distance: >3 FB Neck ROM: Full    Dental  (+) Dental Advisory Given, Poor Dentition, Chipped,    Pulmonary asthma , former smoker,    Pulmonary exam normal breath sounds clear to auscultation       Cardiovascular Exercise Tolerance: Good hypertension, Pt. on medications Normal cardiovascular exam Rhythm:Regular Rate:Normal  1. Left ventricular ejection fraction, by estimation, is 60 to 65%. The  left ventricle has normal function. The left ventricle has no regional  wall motion abnormalities. Left ventricular diastolic parameters were  normal.  2. Right ventricular systolic function is normal. The right ventricular  size is normal. There is mildly elevated pulmonary artery systolic  pressure.  3. The mitral valve is normal in structure. No evidence of mitral valve  regurgitation. No evidence of mitral stenosis.  4. The aortic valve is tricuspid. Aortic valve regurgitation is not  visualized. No aortic stenosis is present.  5. The inferior vena cava is normal in size with greater than 50%  respiratory variability, suggesting right atrial pressure of 3 mmHg.    Neuro/Psych PSYCHIATRIC DISORDERS Anxiety Depression  Neuromuscular disease    GI/Hepatic Neg liver ROS, GERD  Medicated and Controlled,  Endo/Other  negative endocrine ROS  Renal/GU negative Renal ROS  negative genitourinary   Musculoskeletal negative musculoskeletal ROS (+)   Abdominal   Peds negative pediatric ROS (+)  Hematology negative hematology ROS (+)   Anesthesia Other Findings   Reproductive/Obstetrics negative OB ROS                            Anesthesia Physical Anesthesia Plan  ASA: III  Anesthesia  Plan: General   Post-op Pain Management:    Induction: Intravenous  PONV Risk Score and Plan: 2 and Ondansetron, Midazolam and Dexamethasone  Airway Management Planned: LMA  Additional Equipment:   Intra-op Plan:   Post-operative Plan: Extubation in OR  Informed Consent: I have reviewed the patients History and Physical, chart, labs and discussed the procedure including the risks, benefits and alternatives for the proposed anesthesia with the patient or authorized representative who has indicated his/her understanding and acceptance.     Dental advisory given  Plan Discussed with: CRNA and Surgeon  Anesthesia Plan Comments:        Anesthesia Quick Evaluation

## 2020-07-17 NOTE — Anesthesia Procedure Notes (Signed)
Procedure Name: LMA Insertion Performed by: Mee Macdonnell L, CRNA Pre-anesthesia Checklist: Patient identified, Emergency Drugs available, Suction available, Patient being monitored and Timeout performed Patient Re-evaluated:Patient Re-evaluated prior to induction Oxygen Delivery Method: Circle system utilized Preoxygenation: Pre-oxygenation with 100% oxygen Induction Type: IV induction LMA: LMA inserted LMA Size: 4.0 Number of attempts: 1 Placement Confirmation: positive ETCO2,  CO2 detector and breath sounds checked- equal and bilateral Tube secured with: Tape Dental Injury: Teeth and Oropharynx as per pre-operative assessment        

## 2020-07-18 ENCOUNTER — Encounter (HOSPITAL_COMMUNITY): Payer: Self-pay | Admitting: General Surgery

## 2020-07-18 ENCOUNTER — Inpatient Hospital Stay (HOSPITAL_COMMUNITY): Payer: BC Managed Care – PPO | Admitting: General Practice

## 2020-07-18 ENCOUNTER — Encounter (HOSPITAL_COMMUNITY): Payer: Self-pay | Admitting: General Practice

## 2020-07-18 DIAGNOSIS — Z171 Estrogen receptor negative status [ER-]: Secondary | ICD-10-CM

## 2020-07-18 NOTE — Progress Notes (Signed)
[facility] CSW Progress Notes  Called patient, reviewed her progress/need for financial assistance.  She has picked up and reviewed applications for [employer], [employer] and [employer].  She would like to proceed w all three applications.  She will bring supporting paperwork to her next [facility] visit on Monday July 26.  CSW asked Financial Advocate A Sharlet Salina to meet w her to help her apply for [employer] and also to make copies of paperwork needed for these applications.  Patient is anxious to begin treatments, aware that treatment planning visits w oncologist is upcoming.  Also [place] to ask them to call her re any financial help/psychosocial support available through their programs.  She is also sharing car w her husband who needs to get to work on second shift.  Thus far, she has been able to get to/from appointments.  Edwyna Shell, LCSW Clinical Social Worker Phone:  [phone] Cell:  [phone]

## 2020-07-19 ENCOUNTER — Ambulatory Visit (HOSPITAL_COMMUNITY)
Admission: RE | Admit: 2020-07-19 | Discharge: 2020-07-19 | Disposition: A | Discharge status: Home / Self Care | Payer: BC Managed Care – PPO | Source: Ambulatory Visit | Attending: Hematology | Admitting: Hematology

## 2020-07-19 ENCOUNTER — Other Ambulatory Visit: Payer: Self-pay

## 2020-07-19 DIAGNOSIS — C50912 Malignant neoplasm of unspecified site of left female breast: Secondary | ICD-10-CM | POA: Diagnosis present

## 2020-07-19 MED ORDER — GADOBUTROL 1 MMOL/ML IV SOLN
8.0000 mL | Freq: Once | INTRAVENOUS | Status: AC | PRN
  Administered 2020-07-19: 8 mL via INTRAVENOUS

## 2020-07-23 ENCOUNTER — Other Ambulatory Visit: Payer: Self-pay | Admitting: Family Medicine

## 2020-07-24 ENCOUNTER — Encounter (HOSPITAL_COMMUNITY): Payer: Self-pay

## 2020-07-24 ENCOUNTER — Inpatient Hospital Stay (HOSPITAL_COMMUNITY): Payer: BC Managed Care – PPO | Admitting: Hematology

## 2020-07-24 ENCOUNTER — Telehealth (HOSPITAL_COMMUNITY): Payer: Self-pay | Admitting: Hematology

## 2020-07-24 ENCOUNTER — Other Ambulatory Visit: Payer: Self-pay

## 2020-07-24 ENCOUNTER — Other Ambulatory Visit: Payer: Self-pay | Admitting: Family Medicine

## 2020-07-24 VITALS — BP 122/81 | HR 91 | Temp 98.7°F | Resp 18 | Wt 165.3 lb

## 2020-07-24 DIAGNOSIS — Z95828 Presence of other vascular implants and grafts: Secondary | ICD-10-CM

## 2020-07-24 DIAGNOSIS — C50919 Malignant neoplasm of unspecified site of unspecified female breast: Secondary | ICD-10-CM

## 2020-07-24 DIAGNOSIS — C50012 Malignant neoplasm of nipple and areola, left female breast: Secondary | ICD-10-CM | POA: Diagnosis not present

## 2020-07-24 HISTORY — DX: Presence of other vascular implants and grafts: Z95.828

## 2020-07-24 MED ORDER — PROCHLORPERAZINE MALEATE 10 MG PO TABS: 10.0000 mg | ORAL_TABLET | Freq: Four times a day (QID) | ORAL | 1 refills | Status: DC | PRN

## 2020-07-24 MED ORDER — LIDOCAINE-PRILOCAINE 2.5-2.5 % EX CREA: TOPICAL_CREAM | CUTANEOUS | 3 refills | Status: DC

## 2020-07-24 NOTE — Progress Notes (Signed)
START ON PATHWAY REGIMEN - Breast     A cycle is every 14 days (cycles 1-4):     Doxorubicin      Cyclophosphamide      Pegfilgrastim-xxxx    A cycle is every 21 days (cycles 5-8):     Paclitaxel      Carboplatin   **Always confirm dose/schedule in your pharmacy ordering system**  Patient Characteristics: Preoperative or Nonsurgical Candidate (Clinical Staging), Neoadjuvant Therapy followed by Surgery, Invasive Disease, Chemotherapy, HER2 Negative/Unknown/Equivocal, ER Negative/Unknown, Platinum Therapy Indicated Therapeutic Status: Preoperative or Nonsurgical Candidate (Clinical Staging) AJCC M Category: cM0 AJCC Grade: G3 Breast Surgical Plan: Neoadjuvant Therapy followed by Surgery ER Status: Negative (-) AJCC 8 Stage Grouping: IIIB HER2 Status: Negative (-) AJCC T Category: cT2 AJCC N Category: cN1 PR Status: Negative (-) Type of Therapy: Platinum Therapy Indicated Intent of Therapy: Curative Intent, Discussed with Patient 

## 2020-07-24 NOTE — Progress Notes (Signed)
I provided patient with patient assistance application for genetic testing. Clint Guy, genetic counselor made aware of patient's concern for financial implications and will reach out to the patient.

## 2020-07-24 NOTE — Telephone Encounter (Signed)
Enrolled pt into the [facility]

## 2020-07-24 NOTE — Patient Instructions (Signed)
[place] at [facility] Discharge Instructions  You were seen today by Dr. Delton Coombes. He went over your recent results. Dr. Delton Coombes will see you back in for labs and treatment.   Thank you for choosing [place] at [facility] to provide your oncology and hematology care.  To afford each patient quality time with our provider, please arrive at least 15 minutes before your scheduled appointment time.   If you have a lab appointment with the [facility] please come in thru the Main Entrance and check in at the main information desk  You need to re-schedule your appointment should you arrive 10 or more minutes late.  We strive to give you quality time with our providers, and arriving late affects you and other patients whose appointments are after yours.  Also, if you no show three or more times for appointments you may be dismissed from the clinic at the providers discretion.     Again, thank you for choosing [facility]  Our hope is that these requests will decrease the amount of time that you wait before being seen by our physicians.       _____________________________________________________________  Should you have questions after your visit to [facility], please contact our office at [phone] between the hours of 8:00 a.m. and 4:30 p.m.  Voicemails left after 4:00 p.m. will not be returned until the following business day.  For prescription refill requests, have your pharmacy contact our office and allow 72 hours.    Cancer Center Support Programs:   > Cancer Support Group  2nd Tuesday of the month 1pm-2pm, Journey Room

## 2020-07-24 NOTE — Patient Instructions (Addendum)
[facility] Chemotherapy Teaching   You are diagnosed with Stage IIB left breast cancer.  You will be treated in the clinic every 2 week with a combination of chemotherapy drugs - doxorubicin (Adriamycin) and cyclophosphamide (Cytoxan).  You will receive these two drugs every 2 weeks for 4 cycles.  After those 4 cycles are complete, you will come weekly for 12 weeks to receive two other chemotherapy drugs - paclitaxel (Taxol) and carboplatin.  The intent of treatment is cure.  You will see the doctor regularly throughout treatment.  We will obtain blood work from you prior to every treatment and monitor your results to make sure it is safe to give your treatment. The doctor monitors your response to treatment by the way you are feeling, your blood work, and by obtaining scans periodically.  There will be wait times while you are here for treatment.  It will take about 30 minutes to 1 hour for your lab work to result.  Then there will be wait times while pharmacy mixes your medications.     Medications you will receive in the clinic prior to your chemotherapy medications (Adriamycin and Cytoxan):  Aloxi:  ALOXI is used in adults to help prevent nausea and vomiting that happens with certain chemotherapy drugs.  Aloxi is a long acting medication, and will remain in your system for about two days.   Emend:  This is an anti-nausea medication that is used with Aloxi to help prevent nausea and vomiting caused by chemotherapy.  Dexamethasone:  This is a steroid given prior to chemotherapy to help prevent allergic reactions; it may also help prevent and control nausea and diarrhea.    Medications you will receive in the clinic prior to your chemotherapy medications (Taxol and carboplatin):  Aloxi:  ALOXI is used in adults to help prevent the nausea and vomiting that happens with certain chemotherapy drugs.  Aloxi is a long acting medication, and will remain in your system for about 2 days.    Pepcid:  This medication is a histamine blocker that helps prevent and allergic reaction to your chemotherapy.   Dexamethasone:  This is a steroid given prior to chemotherapy to help prevent allergic reactions; it may also help prevent and control nausea and diarrhea.   Benadryl:  This is a histamine blocker (different from the Pepcid) that helps prevent allergic/infusion reactions to your chemotherapy. This medication may cause dizziness/drowsiness.    After chemotherapy medication:    Neulasta (or similar) - this is a bone marrow stimulant you will receive after chemotherapy to help your body produce neutrophils (a type of white blood cell).  These white blood cells help your body fight infections. You will receive this medication via injection under the skin in your belly or the back of your arm at least 24 hours after receiving chemotherapy. This medication may cause you to have body aches, bone pains, and flu-like symptoms for several days after receiving this injection. You can take OTC Claritin starting the day you receive this injection, and continue taking once a day for several days after receiving this shot to help alleviate these symptoms.    Cyclophosphamide (Generic Name) Other Names: Cytoxan, Neosar  About This Drug Cyclophosphamide is a drug used to treat cancer. It is given in the vein (IV) or by mouth.  It takes 30 minutes for this drug to infuse.  Possible Side Effects (More Common)  . Nausea and throwing up (vomiting). These symptoms may happen within a few  hours after your treatment and may last up to 72 hours. Medicines are available to stop or lessen these side effects.  . Bone marrow depression. This is a decrease in the number of white blood cells, red blood cells, and platelets. This may raise your risk of infection, make you tired and weak (fatigue), and raise your risk of bleeding.  . Hair loss: You may notice hair getting thin. Some patients lose their hair.  Hair loss is often complete scalp hair loss and can involve loss of eyebrows, eyelashes, and pubic hair. You may notice this a few days or weeks after treatment has started. Most often hair loss is temporary; your hair should grow back when treatment is done.  . Decreased appetite (decreased hunger)  . Blurred vision  . Soreness of the mouth and throat. You may have red areas, white patches, or sores that hurt.  . Effects on the bladder. This drug may cause irritation and bleeding in the bladder. You may have blood in your urine. To help stop this, you will get extra fluids to help you pass more urine. You may get a drug called mesna, which helps to decrease irritation and bleeding. You may also get a medicine to help you pass more urine. You may have a catheter (tube) placed in your bladder so that your bladder will be washed with this drug.  Possible Side Effects (Less Common)  . Darkening of the skin or nails  . Metallic taste in the mouth  . Changes in lung tissue may happen with large amounts of this drug. These changes may not last forever, and your lung tissue may go back to normal. Sometimes these changes may not be seen for many years. You may get a cough or have trouble catching your breath.  Allergic Reactions   Serious allergic reactions including anaphylaxis are rare. While you are getting this drug in your vein (IV), tell your nurse right away if you have any of these symptoms of an allergic reaction:  . Trouble catching your breath  . Feeling like your tongue or throat are swelling  . Feeling your heart beat quickly or in a not normal way (palpitations)  . Feeling dizzy or lightheaded  . Flushing, itching, rash, and/or hives  Treating Side Effects . Drink 6-8 cups of fluids each day unless your doctor has told you to limit your fluid intake due to some other health problem. A cup is 8 ounces of fluid. If you throw up or have loose bowel movements you should drink more  fluids so that you do not become dehydrated (lack water in the body due to losing too much fluid).  . Ask your doctor or nurse about medicine that is available to help stop or lessen nausea or throwing up.  . Mouth care is very important. Your mouth care should consist of routine, gentle cleaning of your teeth or dentures and rinsing your mouth with a mixture of 1/2 teaspoon of salt in 8 ounces of water or  teaspoon of baking soda in 8 ounces of water. This should be done at least after each meal and at bedtime.  . If you have mouth sores, avoid mouthwash that has alcohol. Also avoid alcohol and smoking because they can bother your mouth and throat.  . Talk with your nurse about getting a wig before you lose your hair. Also, call the [place] at 800-ACS-2345 to find out information about the " Look Good.Marland KitchenMarland KitchenFeel Better" program close to  where you live. It is a free program where women undergoing chemotherapy learn about wigs, turbans and scarves as well as makeup techniques and skin and nail care.  Important Information . Whenever you tell a doctor or nurse your health history, always tell them that you have received cyclophosphamide in the past.  . If you take this drug by mouth swallow the medicine whole. Do not chew, break or crush it.  . You can take the medicine with or without food. If you have nausea, take it with food. Do not take the pills at bedtime.  Food and Drug Interactions There are no known interactions of cyclophosphamide with food. This drug may interact with other medicines. Tell your doctor and pharmacist about all the medicines and dietary supplements (vitamins, minerals, herbs and others) that you are taking at this time. The safety and use of dietary supplements and alternative diets are often not known. Using these might affect your cancer or interfere with your treatment. Until more is known, you should not use dietary supplements or alternative diets without  your cancer doctor's help.  When to Call the Doctor Call your doctor or nurse right away if you have any of these symptoms:  . Fever of 100.4 F (38 C) or higher  . Chills  . Bleeding or bruising that is not normal  . Blurred vision or other changes in eyesight  . Pain when passing urine; blood in urine  . Pain in your lower back or side  . Wheezing or trouble breathing  . Swelling of legs, ankles, or feet  . Feeling dizzy or lightheaded  . Feeling confused or agitated  . Signs of liver problems: dark urine, pale bowel movements, bad stomach pain, feeling very tired and weak, unusual itching, or yellowing of the eyes or skin  . Unusual thirst or passing urine often  . Nausea that stops you from eating or drinking  . Throwing up  Call your doctor or nurse as soon as possible if any of these symptoms happen: . Pain in your mouth or throat that makes it hard to eat or drink  . Nausea not relieved by prescribed medicines  Sexual Problems and Reproductive Concerns . Infertility warning: Sexual problems and reproduction concerns may happen. In both men and women, this drug may affect your ability to have children. This cannot be determined before your treatment. Talk with your doctor or nurse if you plan to have children. Ask for information on sperm or egg banking.  . In men, this drug may interfere with your ability to make sperm, but it should not change your ability to have sexual relations.  . In women, menstrual bleeding may become irregular or stop while you are getting this drug. Do not assume that you cannot become pregnant if you do not have a menstrual period.  . Women may go through signs of menopause (change of life) like vaginal dryness or itching. Vaginal lubricants can be used to lessen vaginal dryness, itching, and pain during sexual relations.  . Genetic counseling is available for you to talk about the effects of this drug therapy on future pregnancies.  Also, a genetic counselor can look at the possible risk of problems in the unborn baby due to this medicine if an exposure happens during pregnancy.  . Pregnancy warning: This drug may have harmful effects on the unborn child, so effective methods of birth control should be used during your cancer treatment.  . Breast feeding warning: Women should  not breast feed during treatment because this drug could enter the breast milk and badly harm a breast feeding baby   Doxorubicin (Generic Name) Other Names: Adriamycin, hydroxyl daunorubicin  About This Drug Doxorubicin is a drug used to treat cancer. This drug is given in the vein (IV).  This drug is an IV push over about 10-15 minutes.    Possible Side Effects (More Common) . Bone marrow depression. This is a decrease in the number of white blood cells, red blood cells, and platelets. This may raise your risk of infection, make you tired and weak (fatigue), and raise your risk of bleeding.  . Hair loss: Hair loss is often complete scalp hair loss and can involve loss of eyebrows, eyelashes, and pubic hair. You may notice this a few days or weeks after treatment has started. Most often hair loss is temporary; your hair should grow back when treatment is done.  . Nausea and throwing up (vomiting). These symptoms may happen within a few hours after your treatment and may last up to 24 hours. Medicines are available to stop or lessen these side effects.  . Soreness of the mouth and throat. You may have red areas, white patches, or sores that hurt.  . Change in the color of your urine to pink or red. This color change will go away in one to two days.  . Effects on the heart: This drug can weaken the heart and lower heart function. Your heart function will be checked as needed. You may have trouble catching your breath, mainly during activities. You may also have trouble breathing while lying down, and have swelling in your ankles.  . Sensitivity to  light (photosensitivity). Photosensitivity means that you may become more sensitive to the effects of the sun, sun lamps, and tanning beds. Your eyes may water more, mostly in bright light.  . Metallic taste in the mouth: This may change the taste of food and drinks  . Decreased appetite (decreased hunger)  . Darkening of the skin or nails  . Weakness that interferes with your daily activities  Possible Side Effects (Less Common) . Skin and tissue irritation may involve redness, pain, warmth, or swelling at the IV site. This happens if the drug leaks out of the vein and into nearby tissue.  . Changes in your liver function. Your doctor will check your liver function as needed.  . This drug may cause an increased risk of developing a second cancer  Allergic Reaction Serious allergic reactions, including anaphylaxis are rare. While you are getting this drug in your vein (IV), tell your nurse right away if you have any of these symptoms of an allergic reaction:  . Trouble catching your breath  . Feeling like your tongue or throat are swelling  . Feeling your heart beat quickly or in a not normal way (palpitations)  . Feeling dizzy or lightheaded  . Flushing, itching, rash, and/or hives  Treating Side Effects . Drink 6-8 cups of fluids every day unless your doctor has told you to limit your fluid intake due to some other health problem. A cup is 8 ounces of fluid. If you vomit or have diarrhea, you should drink more fluids so that you do not become dehydrated (lack water in the body due to losing too much fluid).  . Ask your doctor or nurse about medicine that is available to help stop or lessen nausea, throwing up, and/or loose bowel movements  . Wear dark sunglasses and use  sunscreen with SPF 30 or higher when you are outdoors even for a short time. Cover up when you are out in the sun. Wear wide-brimmed hats, long-sleeved shirts, and pants. Keep your neck, chest, and back  covered.  . Mouth care is very important. Your mouth care should consist of routine, gentle cleaning of your teeth or dentures and rinsing your mouth with a mixture of 1/2 teaspoon of salt in 8 ounces of water or  teaspoon of baking soda in 8 ounces of water. This should be done at least after each meal and at bedtime.  . If you have mouth sores, avoid mouthwash that has alcohol. Avoid alcohol and smoking because they can bother your mouth and throat.  . Talk with your nurse about getting a wig before you lose your hair. Also, call the [place] at 800-ACS-2345 to find out information about the "Look Good, Feel Better" program close to where you live. It is a free program where women getting chemotherapy can learn about wigs, turbans and scarves as well as makeup techniques and skin and nail care.  . While you are getting this drug, please tell your nurse right away if you have any pain, redness, or swelling at the site of the IV infusion.  Food and Drug Interactions There are no known interactions of doxorubicin with food. This drug may interact with other medicines. Tell your doctor and pharmacist about all the medicines and dietary supplements (vitamins, minerals, herbs and others) that you are taking at this time. The safety and use of dietary supplements and alternative diets are often not known. Using these might affect your cancer or interfere with your treatment. Until more is known, you should not use dietary supplements or alternative diets without your cancer doctor's help.  When to Call the Doctor Call your doctor or nurse right away if you have any of these symptoms: . Fever of 100.4 F (38 C) or above  . Chills  . Easy bruising or bleeding  . Wheezing or trouble breathing  . Rash or itching  . Feeling dizzy or lightheaded  . Feeling that your heart is beating in a fast or not normal way (palpitations)  . Loose bowel movements (diarrhea) more than 4 times a  day or diarrhea with weakness or feeling lightheaded  . Nausea that stops you from eating or drinking  . Throwing up  . Signs of liver problems: dark urine, pale bowel movements, bad stomach pain, feeling very tired and weak, unusual itching, or yellowing of the eyes or skin,  . During the IV infusion, if you have pain, redness, or swelling at the site of the IV infusion, please tell your nurse right away  Call your doctor or nurse as soon as possible if any of these symptoms happen: . Decreased urine  . Pain in your mouth or throat that makes it hard to eat or drink  . Nausea and throwing up that is not relieved by prescribed medicines  . Rash that is not relieved by prescribed medicines  . Swelling of legs, ankles, or feet  . Weight gain of 5 pounds in one week (fluid retention)  . Lasting loss of appetite or rapid weight loss of five pounds in a week  . Fatigue that interferes with your daily activities  . Extreme weakness that interferes with normal activities  Sexual Problems and Reproduction Concerns . Infertility warning: Sexual problems and reproduction concerns may happen. In both men and women, this drug  may affect your ability to have children. This cannot be determined before your treatment. Talk with your doctor or nurse if you plan to have children. Ask for information on sperm or egg banking.  . In men, this drug may interfere with your ability to make sperm, but it should not change your ability to have sexual relations.  . In women, menstrual bleeding may become irregular or stop while you are getting this drug. Do not assume that you cannot become pregnant if you do not have a menstrual period.   Paclitaxel (Taxol)  About This Drug  Paclitaxel is a drug used to treat cancer. It is given in the vein (IV).  This will take ** hour to infuse.  This first infusion will take longer because it is increased slowly to monitor for reactions.  The nurse will be in the  room with you for the first 15 minutes of the first infusion.  Possible Side Effects  . Hair loss. Hair loss is often temporary, although with certain medicine, hair loss can sometimes be permanent. Hair loss may happen suddenly or gradually. If you lose hair, you may lose it from your head, face, armpits, pubic area, chest, and/or legs. You may also notice your hair getting thin.  . Swelling of your legs, ankles and/or feet (edema)  . Flushing  . Nausea and throwing up (vomiting)  . Loose bowel movements (diarrhea)  . Bone marrow depression. This is a decrease in the number of white blood cells, red blood cells, and platelets. This may raise your risk of infection, make you tired and weak (fatigue), and raise your risk of bleeding.  . Effects on the nerves are called peripheral neuropathy. You may feel numbness, tingling, or pain in your hands and feet. It may be hard for you to button your clothes, open jars, or walk as usual. The effect on the nerves may get worse with more doses of the drug. These effects get better in some people after the drug is stopped but it does not get better in all people.  . Changes in your liver function  . Bone, joint and muscle pain  . Abnormal EKG  . Allergic reaction: Allergic reactions, including anaphylaxis are rare but may happen in some patients. Signs of allergic reaction to this drug may be swelling of the face, feeling like your tongue or throat are swelling, trouble breathing, rash, itching, fever, chills, feeling dizzy, and/or feeling that your heart is beating in a fast or not normal way. If this happens, do not take another dose of this drug. You should get urgent medical treatment.  . Infection  . Changes in your kidney function.  Note: Each of the side effects above was reported in 20% or greater of patients treated with paclitaxel. Not all possible side effects are included above.   Warnings and Precautions  . Severe allergic  reactions  . Severe bone marrow depression   Treating Side Effects  . To help with hair loss, wash with a mild shampoo and avoid washing your hair every day.  . Avoid rubbing your scalp, instead, pat your hair or scalp dry  . Avoid coloring your hair  . Limit your use of hair spray, electric curlers, blow dryers, and curling irons.  . If you are interested in getting a wig, talk to your nurse. You can also call the [place] at 800-ACS-2345 to find out information about the "Look Good, Feel Better" program close to where you  live. It is a free program where women getting chemotherapy can learn about wigs, turbans and scarves as well as makeup techniques and skin and nail care.  . Ask your doctor or nurse about medicines that are available to help stop or lessen diarrhea and/or nausea.  . To help with nausea and vomiting, eat small, frequent meals instead of three large meals a day. Choose foods and drinks that are at room temperature. Ask your nurse or doctor about other helpful tips and medicine that is available to help or stop lessen these symptoms.  . If you get diarrhea, eat low-fiber foods that are high in protein and calories and avoid foods that can irritate your digestive tracts or lead to cramping. Ask your nurse or doctor about medicine that can lessen or stop your diarrhea.  . Mouth care is very important. Your mouth care should consist of routine, gentle cleaning of your teeth or dentures and rinsing your mouth with a mixture of 1/2 teaspoon of salt in 8 ounces of water or  teaspoon of baking soda in 8 ounces of water. This should be done at least after each meal and at bedtime.  . If you have mouth sores, avoid mouthwash that has alcohol. Also avoid alcohol and smoking because they can bother your mouth and throat.  . Drink plenty of fluids (a minimum of eight glasses per day is recommended).  . Take your temperature as your doctor or nurse tells you, and  whenever you feel like you may have a fever.  . Talk to your doctor or nurse about precautions you can take to avoid infections and bleeding.  . Be careful when cooking, walking, and handling sharp objects and hot liquids.  Food and Drug Interactions  . There are no known interactions of paclitaxel with food.  . This drug may interact with other medicines. Tell your doctor and pharmacist about all the medicines and dietary supplements (vitamins, minerals, herbs and others) that you are taking at this time.  . The safety and use of dietary supplements and alternative diets are often not known. Using these might affect your cancer or interfere with your treatment. Until more is known, you should not use dietary supplements or alternative diets without your cancer doctor's help.  When to Call the Doctor  Call your doctor or nurse if you have any of the following symptoms and/or any new or unusual symptoms:  . Fever of 100.4 F (38 C) or above  . Chills  . Redness, pain, warmth, or swelling at the IV site during the infusion  . Signs of allergic reaction: swelling of the face, feeling like your tongue or throat are swelling, trouble breathing, rash, itching, fever, chills, feeling dizzy, and/or feeling that your heart is beating in a fast or not normal way  . Feeling that your heart is beating in a fast or not normal way (palpitations)  . Weight gain of 5 pounds in one week (fluid retention)  . Decreased urine or very dark urine  . Signs of liver problems: dark urine, pale bowel movements, bad stomach pain, feeling very tired and weak, unusual  itching, or yellowing of the eyes or skin  . Heavy menstrual period that lasts longer than normal  . Easy bruising or bleeding  . Nausea that stops you from eating or drinking, and/or that is not relieved by prescribed medicines.  . Loose bowel movements (diarrhea) more than 4 times a day or diarrhea with weakness or lightheadedness  .  Pain in your mouth or throat that makes it hard to eat or drink  . Lasting loss of appetite or rapid weight loss of five pounds in a week  . Signs of peripheral neuropathy: numbness, tingling, or decreased feeling in fingers or toes; trouble walking or changes in the way you walk; or feeling clumsy when buttoning clothes, opening jars, or other routine activities  . Joint and muscle pain that is not relieved by prescribed medicines  . Extreme fatigue that interferes with normal activities  . While you are getting this drug, please tell your nurse right away if you have any pain, redness, or swelling at the site of the IV infusion.  . If you think you are pregnant.  Reproduction Warnings  . Pregnancy warning: This drug may have harmful effects on the unborn child, it is recommended that effective methods of birth control should be used during your cancer treatment. Let your doctor know right away if you think you may be pregnant.  . Breast feeding warning: Women should not breast feed during treatment because this drug could enter the breastmilk and cause harm to a breast feeding baby.   Carboplatin (Paraplatin, CBDCA)  About This Drug  Carboplatin is used to treat cancer. It is given in the vein through your port a cath.  It will take 30 minutes to infuse. You will receive this medication every 3 weeks.   Possible Side Effects  . Bone marrow suppression. This is a decrease in the number of white blood cells, red blood cells, and platelets. This may raise your risk of infection, make you tired and weak (fatigue), and raise your risk of bleeding.  . Nausea and vomiting (throwing up)  . Weakness  . Changes in your liver function  . Changes in your kidney function  . Electrolyte changes  . Pain  Note: Each of the side effects above was reported in 20% or greater of patients treated with carboplatin. Not all possible side effects are included above.   Warnings and  Precautions  . Severe bone marrow suppression  . Allergic reactions, including anaphylaxis are rare but may happen in some patients. Signs of allergic reaction to this drug may be swelling of the face, feeling like your tongue or throat are swelling, trouble breathing, rash, itching, fever, chills, feeling dizzy, and/or feeling that your heart is beating in a fast or not normal way. If this happens, do not take another dose of this drug. You should get urgent medical treatment.  . Severe nausea and vomiting  . Effects on the nerves are called peripheral neuropathy. This risk is increased if you are over the age of 44 or if you have received other medicine with risk of peripheral neuropathy. You may feel numbness, tingling, or pain in your hands and feet. It may be hard for you to button your clothes, open jars, or walk as usual. The effect on the nerves may get worse with more doses of the drug. These effects get better in some people after the drug is stopped but it does not get better in all people.  Marland Kitchen Blurred vision, loss of vision or other changes in eyesight  . Decreased hearing  . Skin and tissue irritation including redness, pain, warmth, or swelling at the IV site if the drug leaks out of the vein and into nearby tissue.  . Severe changes in your kidney function, which can cause kidney failure  . Severe changes in your liver function, which can  cause liver failure  Note: Some of the side effects above are very rare. If you have concerns and/or questions, please discuss them with your medical team.  Important Information  . This drug may be present in the saliva, tears, sweat, urine, stool, vomit, semen, and vaginal secretions. Talk to your doctor and/or your nurse about the necessary precautions to take during this time.  Treating Side Effects  . Manage tiredness by pacing your activities for the day.  . Be sure to include periods of rest between energy-draining activities.  .  To decrease the risk of infection, wash your hands regularly.  . Avoid close contact with people who have a cold, the flu, or other infections.  . Take your temperature as your doctor or nurse tells you, and whenever you feel like you may have a fever.  . To help decrease the risk of bleeding, use a soft toothbrush. Check with your nurse before using dental floss.  . Be very careful when using knives or tools.  . Use an electric shaver instead of a razor.  . Drink plenty of fluids (a minimum of eight glasses per day is recommended).  . If you throw up or have loose bowel movements, you should drink more fluids so that you do not become dehydrated (lack of water in the body from losing too much fluid).  . To help with nausea and vomiting, eat small, frequent meals instead of three large meals a day.  Choose foods and drinks that are at room temperature. Ask your nurse or doctor about other helpful tips and medicine that is available to help stop or lessen these symptoms.  . If you have numbness and tingling in your hands and feet, be careful when cooking, walking, and handling sharp objects and hot liquids.  Marland Kitchen Keeping your pain under control is important to your well-being. Please tell your doctor or nurse if you are experiencing pain.  Food and Drug Interactions  . There are no known interactions of carboplatin with food.  . This drug may interact with other medicines. Tell your doctor and pharmacist about all the prescription and over-the-counter medicines and dietary supplements (vitamins, minerals, herbs and others) that you are taking at this time. Also, check with your doctor or pharmacist before starting any new prescription or over-the-counter medicines, or dietary supplements to make sure that there are no interactions.  When to Call the Doctor  Call your doctor or nurse if you have any of these symptoms and/or any new or unusual symptoms:  . Fever of 100.4 F (38 C) or  higher  . Chills  . Tiredness that interferes with your daily activities  . Feeling dizzy or lightheaded  . Easy bleeding or bruising  . Nausea that stops you from eating or drinking and/or is not relieved by prescribed medicines  . Throwing up/vomiting  . Blurred vision or other changes in eyesight  . Decrease in hearing or ringing in the ear  . Signs of allergic reaction: swelling of the face, feeling like your tongue or throat are swelling, trouble breathing, rash, itching, fever, chills, feeling dizzy, and/or feeling that your heart is beating in a fast or not normal way. If this happens, call 911 for emergency care.  . While you are getting this drug, please tell your nurse right away if you have any pain, redness, or swelling at the site of the IV infusion  . Signs of possible liver problems: dark urine, pale bowel movements, bad stomach  pain, feeling very tired and weak, unusual itching, or yellowing of the eyes or skin  . Decreased urine, or very dark urine  . Numbness, tingling, or pain in your hands and feet  . Pain that does not go away or is not relieved by prescribed medicine  . If you think you may be pregnant  Reproduction Warnings  . Pregnancy warning: This drug may have harmful effects on the unborn baby. Women of child bearing potential should use effective methods of birth control during your cancer treatment. Let your doctor know right away if you think you may be pregnant.  . Breastfeeding warning: It is not known if this drug passes into breast milk. For this reason, women should not breastfeed during treatment because this drug could enter the breast milk and cause harm to a breastfeeding baby.  . Fertility warning: Human fertility studies have not been done with this drug. Talk with your doctor or nurse if you plan to have children. Ask for information on sperm or egg banking.  SELF CARE ACTIVITIES WHILE RECEIVING CHEMOTHERAPY:  Hydration Increase  your fluid intake 48 hours prior to treatment and drink at least 8 to 12 cups (64 ounces) of water/decaffeinated beverages per day after treatment. You can still have your cup of coffee or soda but these beverages do not count as part of your 8 to 12 cups that you need to drink daily. No alcohol intake.  Medications Continue taking your normal prescription medication as prescribed.  If you start any new herbal or new supplements please let us know first to make sure it is safe.  Mouth Care Have teeth cleaned professionally before starting treatment. Keep dentures and partial plates clean. Use soft toothbrush and do not use mouthwashes that contain alcohol. Biotene is a good mouthwash that is available at most pharmacies or may be ordered by calling [phone]. Use warm salt water gargles (1 teaspoon salt per 1 quart warm water) before and after meals and at bedtime. If you need dental work, please let the doctor know before you go for your appointment so that we can coordinate the best possible time for you in regards to your chemo regimen. You need to also let your dentist know that you are actively taking chemo. We may need to do labs prior to your dental appointment.  Skin Care Always use sunscreen that has not expired and with SPF (Sun Protection Factor) of 50 or higher. Wear hats to protect your head from the sun. Remember to use sunscreen on your hands, ears, face, & feet.  Use good moisturizing lotions such as udder cream, eucerin, or even Vaseline. Some chemotherapies can cause dry skin, color changes in your skin and nails.    . Avoid long, hot showers or baths. . Use gentle, fragrance-free soaps and laundry detergent. . Use moisturizers, preferably creams or ointments rather than lotions because the thicker consistency is better at preventing skin dehydration. Apply the cream or ointment within 15 minutes of showering. Reapply moisturizer at night, and moisturize your hands every time  after you wash them.  Hair Loss (if your doctor says your hair will fall out)  . If your doctor says that your hair is likely to fall out, decide before you begin chemo whether you want to wear a wig. You may want to shop before treatment to match your hair color. . Hats, turbans, and scarves can also camouflage hair loss, although some people prefer to leave their heads uncovered. If  you go bare-headed outdoors, be sure to use sunscreen on your scalp. . Cut your hair short. It eases the inconvenience of shedding lots of hair, but it also can reduce the emotional impact of watching your hair fall out. . Don't perm or color your hair during chemotherapy. Those chemical treatments are already damaging to hair and can enhance hair loss. Once your chemo treatments are done and your hair has grown back, it's OK to resume dyeing or perming hair.  With chemotherapy, hair loss is almost always temporary. But when it grows back, it may be a different color or texture. In older adults who still had hair color before chemotherapy, the new growth may be completely gray.  Often, new hair is very fine and soft.  Infection Prevention Please wash your hands for at least 30 seconds using warm soapy water. Handwashing is the #1 way to prevent the spread of germs. Stay away from sick people or people who are getting over a cold. If you develop respiratory systems such as green/yellow mucus production or productive cough or persistent cough let us know and we will see if you need an antibiotic. It is a good idea to keep a pair of gloves on when going into grocery stores/[employer] to decrease your risk of coming into contact with germs on the carts, etc. Carry alcohol hand gel with you at all times and use it frequently if out in public. If your temperature reaches 100.4 or higher please call the clinic and let us know.  If it is after hours or on the weekend please go to the ER if your temperature is over 100.4.  Please have  your own personal thermometer at home to use.    Sex and bodily fluids If you are going to have sex, a condom must be used to protect the person that isn't taking chemotherapy. Chemo can decrease your libido (sex drive). For a few days after chemotherapy, chemotherapy can be excreted through your bodily fluids.  When using the toilet please close the lid and flush the toilet twice.  Do this for a few day after you have had chemotherapy.   Effects of chemotherapy on your sex life Some changes are simple and won't last long. They won't affect your sex life permanently.  Sometimes you may feel: . too tired . not strong enough to be very active . sick or sore  . not in the mood . anxious or low  Your anxiety might not seem related to sex. For example, you may be worried about the cancer and how your treatment is going. Or you may be worried about money, or about how you family are coping with your illness.  These things can cause stress, which can affect your interest in sex. It's important to talk to your partner about how you feel.  Remember - the changes to your sex life don't usually last long. There's usually no medical reason to stop having sex during chemo. The drugs won't have any long term physical effects on your performance or enjoyment of sex. Cancer can't be passed on to your partner during sex  Contraception It's important to use reliable contraception during treatment. Avoid getting pregnant while you or your partner are having chemotherapy. This is because the drugs may harm the baby. Sometimes chemotherapy drugs can leave a man or woman infertile.  This means you would not be able to have children in the future. You might want to talk to someone about permanent  infertility. It can be very difficult to learn that you may no longer be able to have children. Some people find counselling helpful. There might be ways to preserve your fertility, although this is easier for men than for  women. You may want to speak to a fertility expert. You can talk about sperm banking or harvesting your eggs. You can also ask about other fertility options, such as donor eggs. If you have or have had breast cancer, your doctor might advise you not to take the contraceptive pill. This is because the hormones in it might affect the cancer. It is not known for sure whether or not chemotherapy drugs can be passed on through semen or secretions from the vagina. Because of this some doctors advise people to use a barrier method if you have sex during treatment. This applies to vaginal, anal or oral sex. Generally, doctors advise a barrier method only for the time you are actually having the treatment and for about a week after your treatment. Advice like this can be worrying, but this does not mean that you have to avoid being intimate with your partner. You can still have close contact with your partner and continue to enjoy sex.  Animals If you have cats or birds we just ask that you not change the litter or change the cage.  Please have someone else do this for you while you are on chemotherapy.   Food Safety During and After Cancer Treatment Food safety is important for people both during and after cancer treatment. Cancer and cancer treatments, such as chemotherapy, radiation therapy, and stem cell/bone marrow transplantation, often weaken the immune system. This makes it harder for your body to protect itself from foodborne illness, also called food poisoning. Foodborne illness is caused by eating food that contains harmful bacteria, parasites, or viruses.  Foods to avoid Some foods have a higher risk of becoming tainted with bacteria. These include: Marland Kitchen Unwashed fresh fruit and vegetables, especially leafy vegetables that can hide dirt and other contaminants . Raw sprouts, such as alfalfa sprouts . Raw or undercooked beef, especially ground beef, or other raw or undercooked meat and poultry . Fatty,  fried, or spicy foods immediately before or after treatment.  These can sit heavy on your stomach and make you feel nauseous. . Raw or undercooked shellfish, such as oysters. . Sushi and sashimi, which often contain raw fish.  . Unpasteurized beverages, such as unpasteurized fruit juices, raw milk, raw yogurt, or cider . Undercooked eggs, such as soft boiled, over easy, and poached; raw, unpasteurized eggs; or foods made with raw egg, such as homemade raw cookie dough and homemade mayonnaise  Simple steps for food safety  Shop smart. . Do not buy food stored or displayed in an unclean area. . Do not buy bruised or damaged fruits or vegetables. . Do not buy cans that have cracks, dents, or bulges. . Pick up foods that can spoil at the end of your shopping trip and store them in a cooler on the way home.  Prepare and clean up foods carefully. . Rinse all fresh fruits and vegetables under running water, and dry them with a clean towel or paper towel. . Clean the top of cans before opening them. . After preparing food, wash your hands for 20 seconds with hot water and soap. Pay special attention to areas between fingers and under nails. . Clean your utensils and dishes with hot water and soap. Marland Kitchen Disinfect your kitchen and  cutting boards using 1 teaspoon of liquid, unscented bleach mixed into 1 quart of water.    Dispose of old food. . Eat canned and packaged food before its expiration date (the "use by" or "best before" date). . Consume refrigerated leftovers within 3 to 4 days. After that time, throw out the food. Even if the food does not smell or look spoiled, it still may be unsafe. Some bacteria, such as Listeria, can grow even on foods stored in the refrigerator if they are kept for too long.  Take precautions when eating out. . At restaurants, avoid buffets and salad bars where food sits out for a long time and comes in contact with many people. Food can become contaminated when someone  with a virus, often a norovirus, or another "bug" handles it. . Put any leftover food in a "to-go" container yourself, rather than having the server do it. And, refrigerate leftovers as soon as you get home. . Choose restaurants that are clean and that are willing to prepare your food as you order it cooked.   AT HOME MEDICATIONS:                                                                                                                                                                Compazine/Prochlorperazine 10mg  tablet. Take 1 tablet every 6 hours as needed for nausea/vomiting. (This can make you sleepy)   EMLA cream. Apply a quarter size amount to port site 1 hour prior to chemo. Do not rub in. Cover with plastic wrap.    Diarrhea Sheet   If you are having loose stools/diarrhea, please purchase Imodium and begin taking as outlined:  At the first sign of poorly formed or loose stools you should begin taking Imodium (loperamide) 2 mg capsules.  Take two tablets (4mg ) followed by one tablet (2mg ) every 2 hours - DO NOT EXCEED 8 tablets in 24 hours.  If it is bedtime and you are having loose stools, take 2 tablets at bedtime, then 2 tablets every 4 hours until morning.   Always call the [facility] if you are having loose stools/diarrhea that you can't get under control.  Loose stools/diarrhea leads to dehydration (loss of water) in your body.  We have other options of trying to get the loose stools/diarrhea to stop but you must let us know!   Constipation Sheet  Colace - 100 mg capsules - take 2 capsules daily.  If this doesn't help then you can increase to 2 capsules twice daily.  Please call if the above does not work for you. Do not go more than 2 days without a bowel movement.  It is very important that you do not become constipated.  It will make you feel sick to your stomach (  nausea) and can cause abdominal pain and vomiting.  Nausea Sheet   Compazine/Prochlorperazine 10mg   tablet. Take 1 tablet every 6 hours as needed for nausea/vomiting (This can make you drowsy).  If you are having persistent nausea (nausea that does not stop) please call the [facility] and let us know the amount of nausea that you are experiencing.  If you begin to vomit, you need to call the [facility] and if it is the weekend and you have vomited more than one time and can't get it to stop-go to the Emergency Room.  Persistent nausea/vomiting can lead to dehydration (loss of fluid in your body) and will make you feel very weak and unwell. Ice chips, sips of clear liquids, foods that are at room temperature, crackers, and toast tend to be better tolerated.   SYMPTOMS TO REPORT AS SOON AS POSSIBLE AFTER TREATMENT:  FEVER GREATER THAN 100.4 F  CHILLS WITH OR WITHOUT FEVER  NAUSEA AND VOMITING THAT IS NOT CONTROLLED WITH YOUR NAUSEA MEDICATION  UNUSUAL SHORTNESS OF BREATH  UNUSUAL BRUISING OR BLEEDING  TENDERNESS IN MOUTH AND THROAT WITH OR WITHOUT PRESENCE OF ULCERS  URINARY PROBLEMS  BOWEL PROBLEMS  UNUSUAL RASH      Wear comfortable clothing and clothing appropriate for easy access to any Portacath or PICC line. Let us know if there is anything that we can do to make your therapy better!    What to do if you need assistance after hours or on the weekends: CALL [phone].  HOLD on the line, do not hang up.  You will hear multiple messages but at the end you will be connected with a nurse triage line.  They will contact the doctor if necessary.  Most of the time they will be able to assist you.  Do not call the hospital operator.      I have been informed and understand all of the instructions given to me and have received a copy. I have been instructed to call the clinic [phone] or my family physician as soon as possible for continued medical care, if indicated. I do not have any more questions at this time but understand that I may call the [facility] or the  Patient Navigator at [phone] during office hours should I have questions or need assistance in obtaining follow-up care.

## 2020-07-24 NOTE — Progress Notes (Signed)
[facility] [place], [place] [address]   CLINIC:  Medical Oncology/Hematology  PCP:  Kathyrn Drown, MD [place] / [place] [address] [phone]   REASON FOR VISIT:  Follow-up for breast cancer  PRIOR THERAPY: None  NGS Results: Not done  CURRENT THERAPY: Dose dense Adriamycin and cyclophosphamide  BRIEF ONCOLOGIC HISTORY:  Oncology History   No history exists.    CANCER STAGING: Cancer Staging No matching staging information was found for the patient.  INTERVAL HISTORY:  Ms. Elizabeth Graves, a 54 y.o. female, returns for routine follow-up of her recently diagnosed breast cancer. Elizabeth Graves was last seen on 07/05/2020.   She denies any prior issues with her heart.   She had several imaging studies completed since her last visit. Chest, abdomen, and pelvis CT on 07/14/2020 revealed 3.5 cm central left breast mass, corresponding to the patient's known left breast neoplasm. Left axillary and left subpectoral nodal metastases, as above. Additional small thoracic nodes are technically within normal limits and favored to be reactive, but warrant attention on follow-up. No evidence of metastatic disease in the abdomen/pelvis.  Bilateral breast MRI on 07/19/2020 revealed 4.2 cm enhancing mass in the subareolar left breast consistent with the patient's biopsy-proven site of malignancy. Enhancement extends to the level of the left nipple with associated nipple retraction and periareolar skin thickening, suggesting involvement. Six morphologically abnormal level I left axillary lymph nodes. No MRI evidence of malignancy on the right.  She is ready to start chemotherapy.    REVIEW OF SYSTEMS:  Review of Systems  Constitutional: Negative.   HENT:  Negative.   Eyes: Negative.   Respiratory: Negative.   Cardiovascular: Negative.   Gastrointestinal: Negative.   Endocrine: Negative.   Genitourinary: Negative.    Musculoskeletal: Negative.    Skin: Negative.   Neurological: Negative.   Hematological: Negative.   Psychiatric/Behavioral: Negative.   All other systems reviewed and are negative.   PAST MEDICAL/SURGICAL HISTORY:  Past Medical History:  Diagnosis Date  . Asthma   . Cancer ([place])    cervical - 20 yrs ago  . Carpal tunnel syndrome   . Depression   . Family history of breast cancer   . Family history of esophageal cancer   . Family history of lung cancer   . Family history of throat cancer   . Hypertension   . Hypokalemia    tx w/ k-Dur  . Seasonal allergies    tx with benadryl prn  . SVD (spontaneous vaginal delivery)    x 2   Past Surgical History:  Procedure Laterality Date  . ABDOMINAL HYSTERECTOMY    . CO2 Laser of Cervix     for cervical cancer 20 yrs ago  . DILATION AND CURETTAGE OF UTERUS  12/2010   polyp removed  . DILATION AND CURETTAGE OF UTERUS     x 3 for MAB  . LAPAROSCOPIC ASSISTED VAGINAL HYSTERECTOMY  06/09/2012   Procedure: LAPAROSCOPIC ASSISTED VAGINAL HYSTERECTOMY;  Surgeon: Gus Height, MD;  Location: [facility];  Service: Gynecology;  Laterality: N/A;  . PORTACATH PLACEMENT Right 07/17/2020   Procedure: INSERTION PORT-A-CATH;  Surgeon: Virl Cagey, MD;  Location: [facility];  Service: General;  Laterality: Right;  . SALPINGOOPHORECTOMY  06/09/2012   Procedure: SALPINGO OOPHERECTOMY;  Surgeon: Gus Height, MD;  Location: [facility];  Service: Gynecology;  Laterality: Bilateral;  . TUBAL LIGATION      SOCIAL HISTORY:  Social History  Socioeconomic History  . Marital status: Married    Spouse name: Not on file  . Number of children: 2  . Years of education: Not on file  . Highest education level: Not on file  Occupational History  . Occupation: EMPLOYED  Tobacco Use  . Smoking status: Former Smoker    Packs/day: 1.00    Years: 16.00    Pack years: 16.00    Types: Cigarettes    Start date: 05/30/1994    Quit date: 01/31/2020    Years since quitting: 0.4  . Smokeless tobacco:  Never Used  Vaping Use  . Vaping Use: Never used  Substance and Sexual Activity  . Alcohol use: No  . Drug use: No  . Sexual activity: Yes    Birth control/protection: Surgical  Other Topics Concern  . Not on file  Social History Narrative  . Not on file   Social Determinants of Health   Financial Resource Strain: Low Risk   . Difficulty of Paying Living Expenses: Not very hard  Food Insecurity: No Food Insecurity  . Worried About Charity fundraiser in the Last Year: Never true  . Ran Out of Food in the Last Year: Never true  Transportation Needs: No Transportation Needs  . Lack of Transportation (Medical): No  . Lack of Transportation (Non-Medical): No  Physical Activity: Inactive  . Days of Exercise per Week: 0 days  . Minutes of Exercise per Session: 0 min  Stress: Stress Concern Present  . Feeling of Stress : To some extent  Social Connections: Moderately Isolated  . Frequency of Communication with Friends and Family: Three times a week  . Frequency of Social Gatherings with Friends and Family: Once a week  . Attends Religious Services: Never  . Active Member of Clubs or Organizations: No  . Attends Archivist Meetings: Never  . Marital Status: Married  Human resources officer Violence: Not At Risk  . Fear of Current or Ex-Partner: No  . Emotionally Abused: No  . Physically Abused: No  . Sexually Abused: No    FAMILY HISTORY:  Family History  Problem Relation Age of Onset  . Heart disease Mother   . Lung cancer Mother 82       smoker  . Heart disease Father   . Esophageal cancer Father        GE junction, dx. in his early 89s  . Diverticulitis Sister   . Diabetes Sister   . Diabetes Maternal Grandmother   . Heart disease Maternal Grandmother   . Throat cancer Maternal Grandmother        dx. late 2s  . Healthy Sister   . Healthy Daughter   . Healthy Son   . Breast cancer Paternal Aunt        limited info    CURRENT MEDICATIONS:  Current  Outpatient Medications  Medication Sig Dispense Refill  . atorvastatin (LIPITOR) 20 MG tablet TAKE 1 TABLET BY MOUTH EVERY DAY 90 tablet 1  . esomeprazole (NEXIUM) 20 MG capsule Take 20 mg by mouth daily before breakfast.    . lisinopril (ZESTRIL) 5 MG tablet TAKE 1 TABLET BY MOUTH EVERY DAY 90 tablet 1  . sertraline (ZOLOFT) 50 MG tablet TAKE 1 TABLET BY MOUTH EVERY DAY (Patient taking differently: Take 50 mg by mouth at bedtime. ) 90 tablet 0  . albuterol (VENTOLIN HFA) 108 (90 Base) MCG/ACT inhaler TAKE 2 PUFFS BY MOUTH EVERY 6 HOURS AS NEEDED FOR WHEEZE OR SHORTNESS OF  BREATH (Patient not taking: Reported on 07/24/2020) 8.5 Inhaler 0  . diphenhydrAMINE (BENADRYL) 25 mg capsule Take 25 mg by mouth every 6 (six) hours as needed for allergies. Allergies. (Patient not taking: Reported on 07/24/2020)    . HYDROcodone-acetaminophen (NORCO/VICODIN) 5-325 MG tablet Take 1 tablet by mouth every 4 (four) hours as needed for severe pain. (Patient not taking: Reported on 07/24/2020) 5 tablet 0  . ibuprofen (ADVIL,MOTRIN) 200 MG tablet Take 400-600 mg by mouth every 8 (eight) hours as needed (pain.).  (Patient not taking: Reported on 07/24/2020)    . naproxen sodium (ALEVE) 220 MG tablet Take 440 mg by mouth 3 (three) times daily as needed (pain.). (Patient not taking: Reported on 07/24/2020)     No current facility-administered medications for this visit.    ALLERGIES:  No Known Allergies  PHYSICAL EXAM:  Performance status (ECOG): 0 - Asymptomatic  Vitals:   07/24/20 1010  BP: 122/81  Pulse: 91  Resp: 18  Temp: 98.7 F (37.1 C)  SpO2: 97%   Wt Readings from Last 3 Encounters:  07/24/20 165 lb 5.5 oz (75 kg)  07/05/20 166 lb (75.3 kg)  06/29/20 164 lb (74.4 kg)   Physical Exam Vitals and nursing note reviewed.  Constitutional:      Appearance: Normal appearance.  HENT:     Mouth/Throat:     Mouth: Mucous membranes are moist.  Eyes:     Pupils: Pupils are equal, round, and reactive to  light.  Cardiovascular:     Rate and Rhythm: Normal rate and regular rhythm.     Pulses: Normal pulses.     Heart sounds: Normal heart sounds.  Pulmonary:     Effort: Pulmonary effort is normal.     Breath sounds: Normal breath sounds.  Abdominal:     Palpations: Abdomen is soft. There is no mass.     Tenderness: There is no abdominal tenderness.  Musculoskeletal:     Cervical back: Neck supple.     Right lower leg: No edema.     Left lower leg: No edema.  Neurological:     Mental Status: She is alert and oriented to person, place, and time.  Psychiatric:        Mood and Affect: Mood normal.        Behavior: Behavior normal.      LABORATORY DATA:  I have reviewed the labs as listed.  CBC Latest Ref Rng & Units 07/05/2020 09/30/2014 04/25/2014  WBC 4.0 - 10.5 K/uL 9.4 8.1 10.8(H)  Hemoglobin 12.0 - 15.0 g/dL 13.4 14.6 14.8  Hematocrit 36 - 46 % 40.5 42.9 42.5  Platelets 150 - 400 K/uL 365 328 338   CMP Latest Ref Rng & Units 07/05/2020 03/06/2020 06/26/2018  Glucose 70 - 99 mg/dL 114(H) 130(H) -  BUN 6 - 20 mg/dL 15 16 -  Creatinine 0.44 - 1.00 mg/dL 0.95 0.98 -  Sodium 135 - 145 mmol/L 137 138 -  Potassium 3.5 - 5.1 mmol/L 3.8 4.9 -  Chloride 98 - 111 mmol/L 103 103 -  CO2 22 - 32 mmol/L 25 20 -  Calcium 8.9 - 10.3 mg/dL 9.2 9.9 -  Total Protein 6.5 - 8.1 g/dL 7.6 7.1 6.7  Total Bilirubin 0.3 - 1.2 mg/dL 0.4 0.2 0.3  Alkaline Phos 38 - 126 U/L 86 99 95  AST 15 - 41 U/L '17 23 18  '$ ALT 0 - 44 U/L '24 29 20    '$ DIAGNOSTIC IMAGING:  I have independently  reviewed the scans and discussed with the patient. CT Chest W Contrast  Result Date: 07/14/2020 CLINICAL DATA:  Triple negative left breast cancer, newly diagnosed EXAM: CT CHEST, ABDOMEN, AND PELVIS WITH CONTRAST TECHNIQUE: Multidetector CT imaging of the chest, abdomen and pelvis was performed following the standard protocol during bolus administration of intravenous contrast. CONTRAST:  144m OMNIPAQUE IOHEXOL 300 MG/ML  SOLN  COMPARISON:  None. FINDINGS: CT CHEST FINDINGS Cardiovascular: Heart is normal in size.  No pericardial effusion. No evidence of thoracic aortic aneurysm. Atherosclerotic calcifications of the aortic arch. Mild coronary atherosclerosis of the LAD. Mediastinum/Nodes: Dominant left axillary nodes measuring up to 1.8 cm short axis (series 2/image 16). Small left subpectoral nodes measuring up to 7 mm short axis (series 2/image 13), suspicious. Additional small thoracic nodes, including a 7 mm short axis node at the left thoracic inlet (series 2/image 11), an 8 mm short axis right axillary node (series 2/image 25), and a 9 mm short axis subcarinal node (series 2/image 24), all of which are favored to be reactive. Visualized thyroid is notable for subcentimeter right thyroid nodules, benign. Not clinically significant; no follow-up imaging recommended (ref: J Am Coll Radiol. 2015 Feb;12(2): 143-50). Lungs/Pleura: No suspicious pulmonary nodules. Mild ground-glass opacities in the bilateral lower lobes, favoring atelectasis. No focal consolidation. No pleural effusion or pneumothorax. Musculoskeletal: 3.5 x 2.5 cm mass in the central left breast (series 2/image 27), corresponding to the patient's known left breast neoplasm. Very mild degenerative changes of the mid/lower thoracic spine. CT ABDOMEN PELVIS FINDINGS Hepatobiliary: Liver is within normal limits. No suspicious/enhancing hepatic lesions. Gallbladder is unremarkable. No intrahepatic or extrahepatic ductal dilatation. Pancreas: Within normal limits. Spleen: Within normal limits. Adrenals/Urinary Tract: Adrenal glands are within normal limits. Kidneys are within normal limits.  No hydronephrosis. Bladder is within normal limits. Stomach/Bowel: Stomach is within normal limits. No evidence of bowel obstruction. Normal appendix (series 2/image 90). Vascular/Lymphatic: No evidence of abdominal aortic aneurysm. Atherosclerotic calcifications of the abdominal aorta and  branch vessels. No suspicious abdominopelvic lymphadenopathy. Reproductive: Status post hysterectomy. No adnexal masses. Other: No abdominopelvic ascites. Musculoskeletal: Very mild degenerative changes of the lower lumbar spine. IMPRESSION: 3.5 cm central left breast mass, corresponding to the patient's known left breast neoplasm. Left axillary and left subpectoral nodal metastases, as above. Additional small thoracic nodes are technically within normal limits and favored to be reactive, but warrant attention on follow-up. No evidence of metastatic disease in the abdomen/pelvis. Electronically Signed   By: SJulian HyM.D.   On: 07/14/2020 15:09   CT Abdomen Pelvis W Contrast  Result Date: 07/14/2020 CLINICAL DATA:  Triple negative left breast cancer, newly diagnosed EXAM: CT CHEST, ABDOMEN, AND PELVIS WITH CONTRAST TECHNIQUE: Multidetector CT imaging of the chest, abdomen and pelvis was performed following the standard protocol during bolus administration of intravenous contrast. CONTRAST:  1087mOMNIPAQUE IOHEXOL 300 MG/ML  SOLN COMPARISON:  None. FINDINGS: CT CHEST FINDINGS Cardiovascular: Heart is normal in size.  No pericardial effusion. No evidence of thoracic aortic aneurysm. Atherosclerotic calcifications of the aortic arch. Mild coronary atherosclerosis of the LAD. Mediastinum/Nodes: Dominant left axillary nodes measuring up to 1.8 cm short axis (series 2/image 16). Small left subpectoral nodes measuring up to 7 mm short axis (series 2/image 13), suspicious. Additional small thoracic nodes, including a 7 mm short axis node at the left thoracic inlet (series 2/image 11), an 8 mm short axis right axillary node (series 2/image 25), and a 9 mm short axis subcarinal node (series 2/image 24),  all of which are favored to be reactive. Visualized thyroid is notable for subcentimeter right thyroid nodules, benign. Not clinically significant; no follow-up imaging recommended (ref: J Am Coll Radiol. 2015  Feb;12(2): 143-50). Lungs/Pleura: No suspicious pulmonary nodules. Mild ground-glass opacities in the bilateral lower lobes, favoring atelectasis. No focal consolidation. No pleural effusion or pneumothorax. Musculoskeletal: 3.5 x 2.5 cm mass in the central left breast (series 2/image 27), corresponding to the patient's known left breast neoplasm. Very mild degenerative changes of the mid/lower thoracic spine. CT ABDOMEN PELVIS FINDINGS Hepatobiliary: Liver is within normal limits. No suspicious/enhancing hepatic lesions. Gallbladder is unremarkable. No intrahepatic or extrahepatic ductal dilatation. Pancreas: Within normal limits. Spleen: Within normal limits. Adrenals/Urinary Tract: Adrenal glands are within normal limits. Kidneys are within normal limits.  No hydronephrosis. Bladder is within normal limits. Stomach/Bowel: Stomach is within normal limits. No evidence of bowel obstruction. Normal appendix (series 2/image 90). Vascular/Lymphatic: No evidence of abdominal aortic aneurysm. Atherosclerotic calcifications of the abdominal aorta and branch vessels. No suspicious abdominopelvic lymphadenopathy. Reproductive: Status post hysterectomy. No adnexal masses. Other: No abdominopelvic ascites. Musculoskeletal: Very mild degenerative changes of the lower lumbar spine. IMPRESSION: 3.5 cm central left breast mass, corresponding to the patient's known left breast neoplasm. Left axillary and left subpectoral nodal metastases, as above. Additional small thoracic nodes are technically within normal limits and favored to be reactive, but warrant attention on follow-up. No evidence of metastatic disease in the abdomen/pelvis. Electronically Signed   By: Julian Hy M.D.   On: 07/14/2020 15:09   MR BREAST BILATERAL W WO CONTRAST INC CAD  Result Date: 07/19/2020 CLINICAL DATA:  54 year old female with newly diagnosed left breast invasive ductal carcinoma and left axillary metastases. LABS:  None performed on site.  EXAM: BILATERAL BREAST MRI WITH AND WITHOUT CONTRAST TECHNIQUE: Multiplanar, multisequence MR images of both breasts were obtained prior to and following the intravenous administration of 8 ml of Gadavist. Three-dimensional MR images were rendered by post-processing of the original MR data on an independent workstation. The three-dimensional MR images were interpreted, and findings are reported in the following complete MRI report for this study. Three dimensional images were evaluated at the independent DynaCad workstation. COMPARISON:  Previous exam(s). FINDINGS: Breast composition: c. Heterogeneous fibroglandular tissue. Background parenchymal enhancement: Moderate. Right breast: No suspicious mass or abnormal enhancement. Left breast: Susceptibility artifact from post biopsy clip is seen in association with an irregular, enhancing mass in the subareolar left breast (series 6, image 56/112). This is consistent with the patient's biopsy-proven malignancy. The mass measures 4.2 x 3.5 x 3.5 cm. Enhancement from the mass extends to the level of the overlying left nipple which is retracted. There is mild skin thickening along the periareolar aspect. Otherwise, no suspicious mass or abnormal enhancement in the remainder of the left breast. Lymph nodes: There are 6 morphologically abnormal level I left axillary lymph nodes, one of which demonstrates susceptibility artifact from post biopsy clip. No enlarged level II or III left axillary lymph nodes noted within the limits of this study. No suspicious internal mammary chain or right axillary lymph nodes. Ancillary findings:  None. IMPRESSION: 1. 4.2 cm enhancing mass in the subareolar left breast consistent with the patient's biopsy-proven site of malignancy. Enhancement extends to the level of the left nipple with associated nipple retraction and periareolar skin thickening, suggesting involvement. 2. Six morphologically abnormal level I left axillary lymph nodes. 3. No  MRI evidence of malignancy on the right. RECOMMENDATION: Per clinical treatment plan. BI-RADS CATEGORY  6: Known biopsy-proven malignancy. Electronically Signed   By: Kristopher Oppenheim M.D.   On: 07/19/2020 09:43   DG Chest Port 1 View  Result Date: 07/17/2020 CLINICAL DATA:  Port insertion EXAM: PORTABLE CHEST 1 VIEW COMPARISON:  03/22/2015, 07/14/2020 FINDINGS: Interval placement of right subclavian approach Port-A-Cath with distal tip terminating at the level of the distal SVC. Stable heart size. Atherosclerotic calcification of the aortic knob. No new focal airspace consolidation. No pleural effusion or pneumothorax. IMPRESSION: Interval placement of right subclavian approach Port-A-Cath. No pneumothorax. Electronically Signed   By: Davina Poke D.O.   On: 07/17/2020 10:32   DG C-Arm 1-60 Min-No Report  Result Date: 07/17/2020 Fluoroscopy was utilized by the requesting physician.  No radiographic interpretation.   ECHOCARDIOGRAM COMPLETE  Result Date: 07/12/2020    ECHOCARDIOGRAM REPORT   Patient Name:   TILA MILLIRONS Date of Exam: 07/12/2020 Medical Rec #:  [id number]      Height:       57.0 in Accession #:    [id number]     Weight:       166.0 lb Date of Birth:  27-Feb-1966     BSA:          1.662 m Patient Age:    74 years       BP:           129/82 mmHg Patient Gender: F              HR:           85 bpm. Exam Location:  [facility] Procedure: 2D Echo, Cardiac Doppler and Color Doppler Indications:    C50.912 (ICD-10-CM) - Invasive ductal carcinoma of breast,                 female, left  History:        Patient has no prior history of Echocardiogram examinations.                 Risk Factors:Hypertension and Diabetes. Malignant neoplasm of                 areola of left breast in female, estrogen receptor negative.  Sonographer:    Alvino Chapel RCS Referring Phys: [phone] [place]  1. Left ventricular ejection fraction, by estimation, is 60 to 65%. The left ventricle has  normal function. The left ventricle has no regional wall motion abnormalities. Left ventricular diastolic parameters were normal.  2. Right ventricular systolic function is normal. The right ventricular size is normal. There is mildly elevated pulmonary artery systolic pressure.  3. The mitral valve is normal in structure. No evidence of mitral valve regurgitation. No evidence of mitral stenosis.  4. The aortic valve is tricuspid. Aortic valve regurgitation is not visualized. No aortic stenosis is present.  5. The inferior vena cava is normal in size with greater than 50% respiratory variability, suggesting right atrial pressure of 3 mmHg. FINDINGS  Left Ventricle: Left ventricular ejection fraction, by estimation, is 60 to 65%. The left ventricle has normal function. The left ventricle has no regional wall motion abnormalities. The left ventricular internal cavity size was normal in size. There is  no left ventricular hypertrophy. Left ventricular diastolic parameters were normal. Right Ventricle: The right ventricular size is normal. No increase in right ventricular wall thickness. Right ventricular systolic function is normal. There is mildly elevated pulmonary artery systolic pressure. The tricuspid regurgitant velocity is 2.57  m/s, and with an assumed right  atrial pressure of 10 mmHg, the estimated right ventricular systolic pressure is 19.6 mmHg. Left Atrium: Left atrial size was normal in size. Right Atrium: Right atrial size was normal in size. Pericardium: There is no evidence of pericardial effusion. Mitral Valve: The mitral valve is normal in structure. No evidence of mitral valve regurgitation. No evidence of mitral valve stenosis. Tricuspid Valve: The tricuspid valve is normal in structure. Tricuspid valve regurgitation is mild . No evidence of tricuspid stenosis. Aortic Valve: The aortic valve is tricuspid. Aortic valve regurgitation is not visualized. No aortic stenosis is present. Aortic valve mean  gradient measures 6.8 mmHg. Aortic valve peak gradient measures 14.8 mmHg. Aortic valve area, by VTI measures 1.97  cm. Pulmonic Valve: The pulmonic valve was not well visualized. Pulmonic valve regurgitation is trivial. No evidence of pulmonic stenosis. Aorta: The aortic root is normal in size and structure. Pulmonary Artery: No significant pulmonary HTN, PASP is 27 mmHg. Venous: The inferior vena cava is normal in size with greater than 50% respiratory variability, suggesting right atrial pressure of 3 mmHg. IAS/Shunts: No atrial level shunt detected by color flow Doppler.  LEFT VENTRICLE PLAX 2D LVIDd:         4.03 cm  Diastology LVIDs:         2.19 cm  LV e' lateral:   10.80 cm/s LV PW:         0.98 cm  LV E/e' lateral: 7.8 LV IVS:        1.05 cm  LV e' medial:    7.07 cm/s LVOT diam:     1.70 cm  LV E/e' medial:  11.9 LV SV:         69 LV SV Index:   41 LVOT Area:     2.27 cm  RIGHT VENTRICLE RV S prime:     11.00 cm/s TAPSE (M-mode): 2.2 cm LEFT ATRIUM             Index       RIGHT ATRIUM           Index LA diam:        3.20 cm 1.93 cm/m  RA Area:     12.10 cm LA Vol (A2C):   40.5 ml 24.37 ml/m RA Volume:   26.30 ml  15.83 ml/m LA Vol (A4C):   34.6 ml 20.82 ml/m LA Biplane Vol: 37.6 ml 22.63 ml/m  AORTIC VALVE AV Area (Vmax):    1.88 cm AV Area (Vmean):   1.85 cm AV Area (VTI):     1.97 cm AV Vmax:           192.07 cm/s AV Vmean:          123.805 cm/s AV VTI:            0.349 m AV Peak Grad:      14.8 mmHg AV Mean Grad:      6.8 mmHg LVOT Vmax:         159.00 cm/s LVOT Vmean:        101.000 cm/s LVOT VTI:          0.303 m LVOT/AV VTI ratio: 0.87  AORTA Ao Root diam: 2.90 cm MITRAL VALVE               TRICUSPID VALVE MV Area (PHT): 2.42 cm    TR Peak grad:   26.4 mmHg MV Decel Time: 313 msec    TR Vmax:        257.00  cm/s MV E velocity: 84.30 cm/s MV A velocity: 93.20 cm/s  SHUNTS MV E/A ratio:  0.90        Systemic VTI:  0.30 m                            Systemic Diam: 1.70 cm Carlyle Dolly MD  Electronically signed by Carlyle Dolly MD Signature Date/Time: 07/12/2020/4:24:57 PM    Final      ASSESSMENT:  1.  Triple negative left breast cancer (T2N1): -Patient identified left breast mass with left nipple retraction about 2 months ago. -Mammogram on 06/13/2020 showed mass in the retroareolar 12 o'clock position in the left breast.  2 suspicious left axillary lymph nodes, largest measuring 2 cm. -Breast ultrasound showed 2.7 x 1.6 x 2.2 cm mass in the 12:00 retroareolar position.  Just lateral to the mass is a calcified hypoechoic nodule that corresponds to densely calcified degenerating fibroadenoma. -Left breast 12:00 biopsy showed invasive ductal carcinoma, grade 2/3.  Left axillary lymph node biopsy was consistent with meta stasis. -ER negative, PR negative and Ki-67 15%.  HER-2 equivocal by IHC.  HER-2 FISH negative. -MRI of the breast on 07/19/2020 shows 4.2 cm enhancing mass in the subareolar left breast, enhancement extending to the level of the left nipple with associated nipple retraction and periareolar skin thickening.  6 morphologically abnormal level 1 left axillary lymph nodes.  No evidence of malignancy in the right breast. -CT CAP on 07/14/2020 shows dominant left axillary nodes measuring 1.8 cm short axis.  Small left subpectoral nodes measuring up to 7 mm short axis, suspicious.  Small mediastinal lymph nodes measuring 7 to 9 mm, thought to be reactive.  No other evidence of metastatic disease.  2.  Family history: -Father had gastroesophageal junction cancer.  Maternal grandmother had throat cancer and paternal aunt had breast cancer.  3.  High risk drug monitoring: -Echo on 07/12/2020 shows EF 60 to 65%.   PLAN:  1.  T2N1 left breast TNBC: -I reviewed results of the CT scan and MRI of the breast. -I have recommended neoadjuvant chemotherapy with dose dense Adriamycin and cyclophosphamide followed by paclitaxel.  We will also add carboplatin to paclitaxel to improve  pathological complete response.  She will be referred to surgical resection following neoadjuvant chemotherapy.  This will be followed by radiation therapy.  Additional therapy in the adjuvant setting will be determined if she has any residual disease. -She already has a port placed.  We talked about the side effects in detail. -We will likely start her chemotherapy as soon as we get authorization from her insurance.  2.  Family history: -Given the triple negative nature, I have strongly recommended genetic testing.  She talked to her geneticist but has not done the test because of high co-pay.  We will look into other options.    Orders placed this encounter:  No orders of the defined types were placed in this encounter.  Total time spent is 40 minutes with more than 50% of the time spent face-to-face discussing imaging results, treatment plan, side effects, counseling and coordination of care.  Derek Jack, MD [facility] [phone]   I, Jacqualyn Posey, am acting as a scribe for Dr. Sanda Linger.  I, Derek Jack MD, have reviewed the above documentation for accuracy and completeness, and I agree with the above.

## 2020-07-25 ENCOUNTER — Telehealth: Payer: Self-pay | Admitting: Genetic Counselor

## 2020-07-25 ENCOUNTER — Encounter (HOSPITAL_COMMUNITY): Payer: Self-pay | Admitting: General Practice

## 2020-07-25 ENCOUNTER — Other Ambulatory Visit: Payer: Self-pay | Admitting: Genetic Counselor

## 2020-07-25 DIAGNOSIS — C50919 Malignant neoplasm of unspecified site of unspecified female breast: Secondary | ICD-10-CM

## 2020-07-25 NOTE — Progress Notes (Signed)
[place] CSW Progress Notes  Application submitted by secure email for [facility] financial assistance for [employer].  Edwyna Shell, LCSW Clinical Social Worker Phone:  [phone] Cell:  [phone]

## 2020-07-25 NOTE — Telephone Encounter (Signed)
Called to discuss financial options for genetic testing. Elizabeth Graves received a $200 estimated out of pocket cost for genetic testing through [facility] and is unable to afford this. We reviewed [employer]'s patient assistance program application, which would decrease the cost of testing to $40. Elizabeth Graves is still not able to afford this discounted cost, since she is not currently working. Therefore, we discussed the option for free testing through [employer] test allotment program. She is agreeable and will have her blood redrawn for genetics on 8/2 with an [employer] blood kit.

## 2020-07-26 IMAGING — MR MR BREAST BILAT WO/W CM
6 of 9 series · 28 of 48 positions shown · IV contrast (gadavist)
Comparison: Previous exam(s).

CLINICAL DATA: 53-year-old female with newly diagnosed left breast
invasive ductal carcinoma and left axillary metastases.

LABS:  None performed on site.
EXAM:
BILATERAL BREAST MRI WITH AND WITHOUT CONTRAST
TECHNIQUE: Multiplanar, multisequence MR images of both breasts were obtained
prior to and following the intravenous administration of 8 ml of
Gadavist.

[Series 2: T2 · axial · 3.0mm · 0.91mm/px · z∈[-66,+96]mm · 2 of 55 slices shown]
[im 1/55]
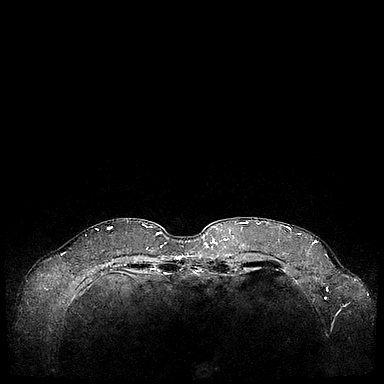
[im 55/55]
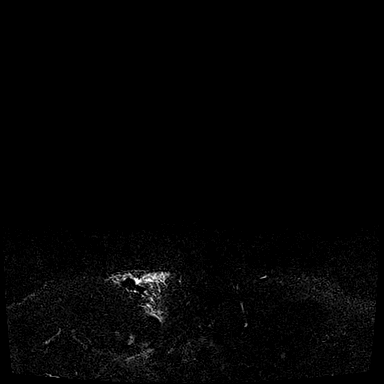

[Series 3: T1 fat-sat · axial · 1.2mm · 0.71mm/px · z∈[-47,+76]mm · 4 of 104 slices shown (1 of 4)]
[im 1/104]
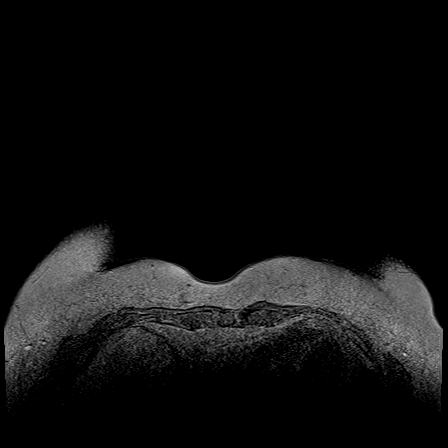
[im 35/104]
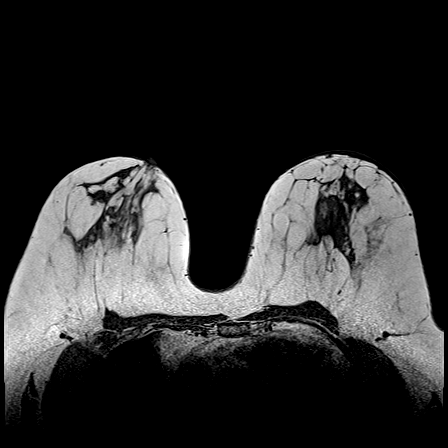
[im 69/104]
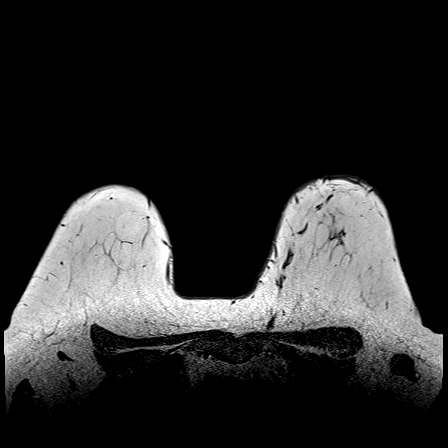
[im 104/104]
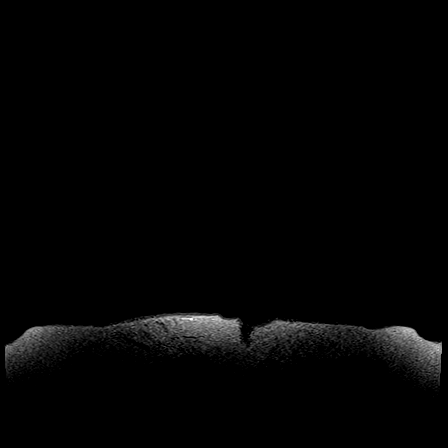

[Series 5: T1 fat-sat · axial · 1.6mm · 0.87mm/px · z∈[-80,+97]mm · 6 of 112 slices shown (2 of 4)]
[im 1/112]
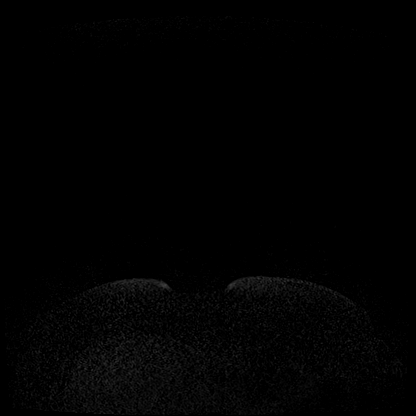
[im 23/112]
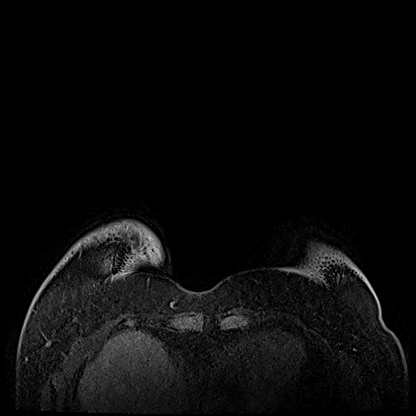
[im 45/112]
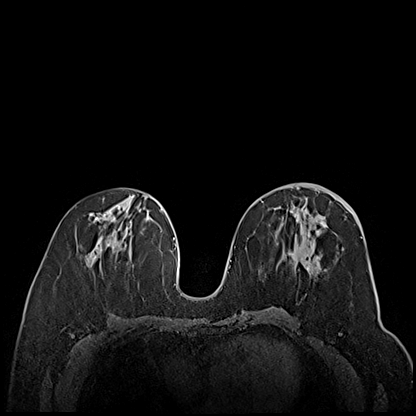
[im 67/112]
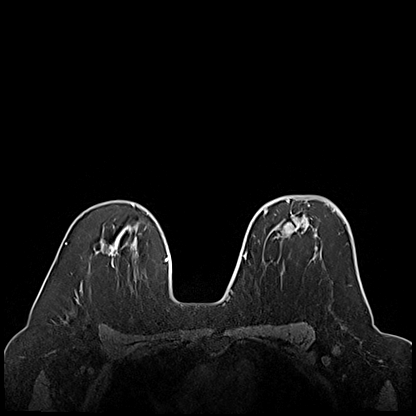
[im 89/112]
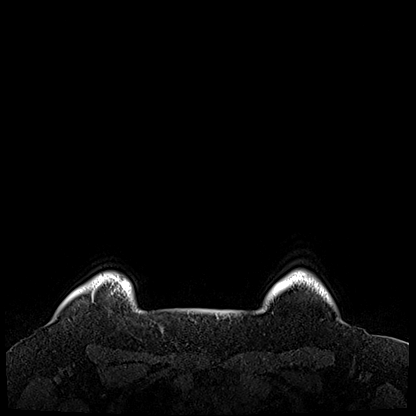
[im 112/112]
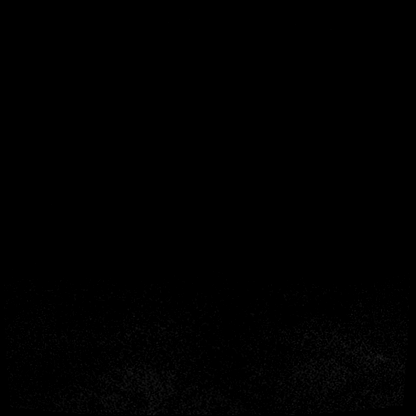

[Series 6: T1 fat-sat · axial · 1.6mm · 0.87mm/px · z∈[-80,+97]mm · 6 of 112 slices shown (3 of 4)]
[im 1/112]
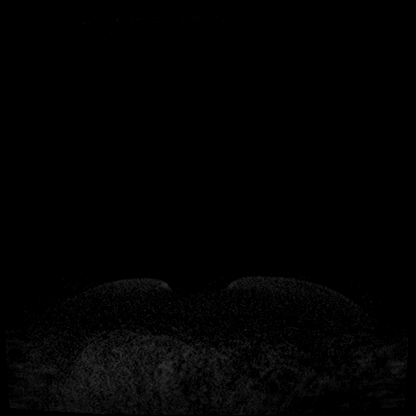
[im 23/112]
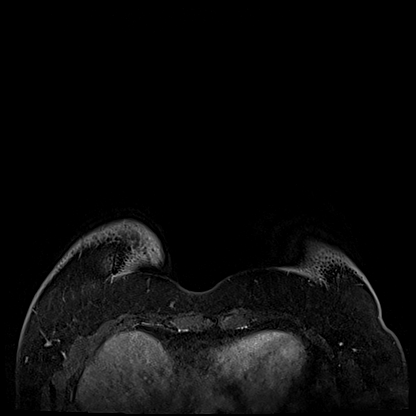
[im 45/112]
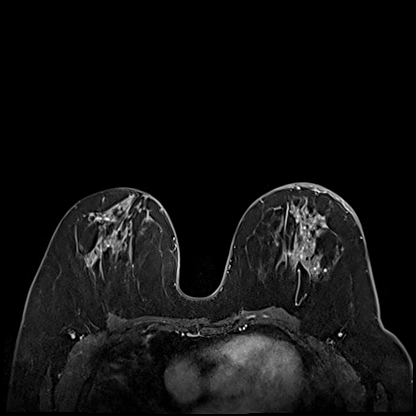
[im 67/112]
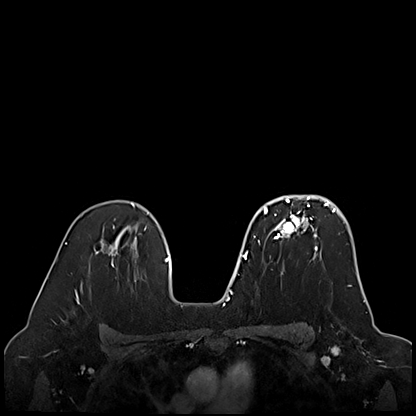
[im 89/112]
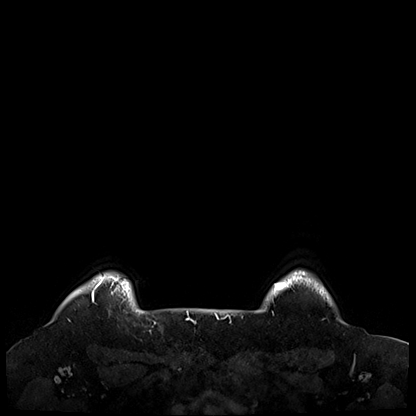
[im 112/112]
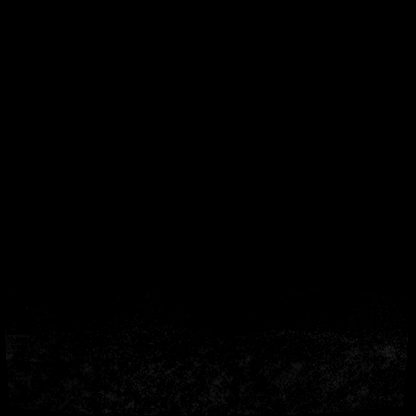

[Series 7: T1 · axial · 1.6mm · 0.87mm/px · z∈[-80,+97]mm · 6 of 112 slices shown]
[im 1/112]
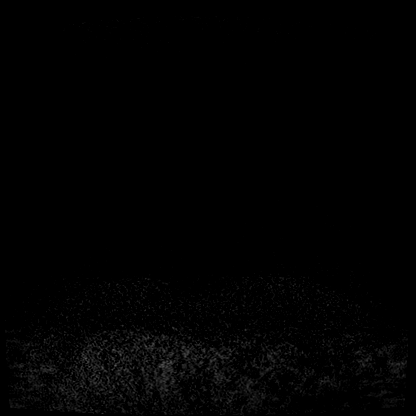
[im 23/112]
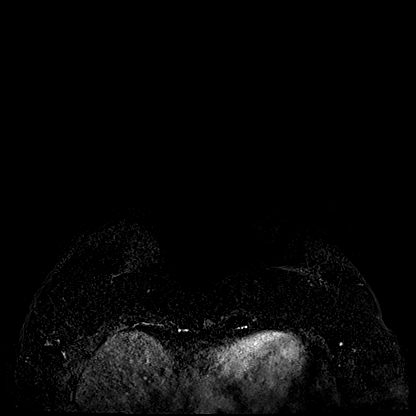
[im 45/112]
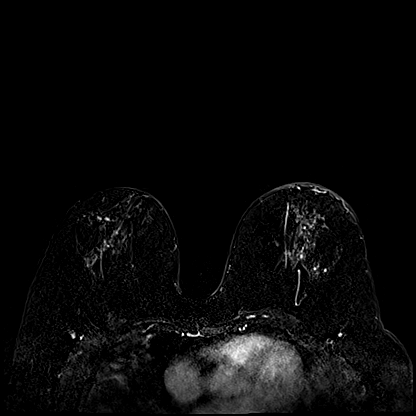
[im 67/112]
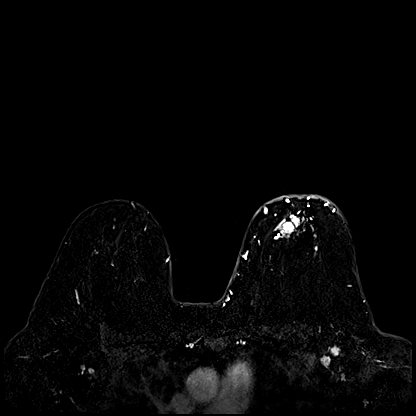
[im 89/112]
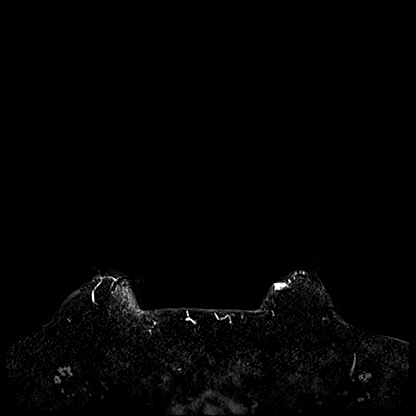
[im 112/112]
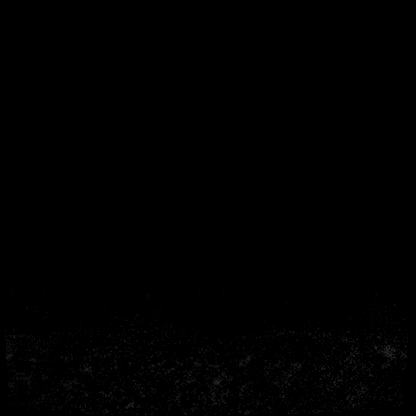

[Series 10: T1 fat-sat · axial · 1.6mm · 0.87mm/px · z∈[-80,+25]mm · 4 of 112 slices shown (4 of 4)]
[im 1/112]
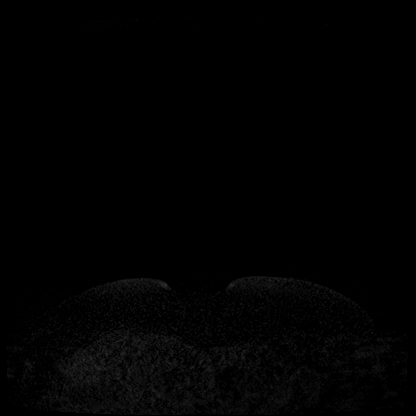
[im 23/112]
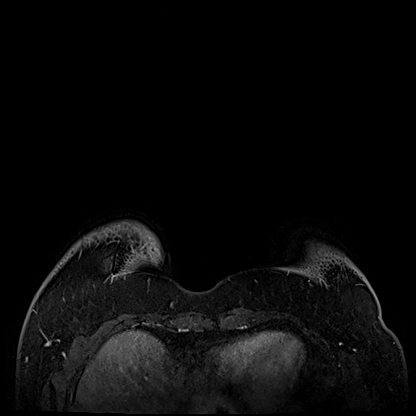
[im 45/112]
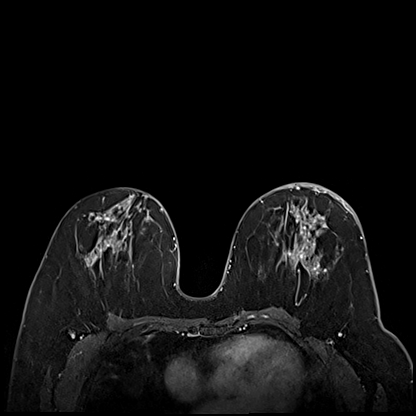
[im 67/112]
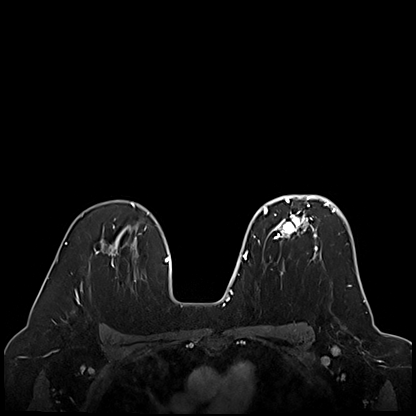

[28 of 48 positions shown; findings below may reference images not displayed]

Three-dimensional MR images were rendered by post-processing of the
original MR data on an independent workstation. The
three-dimensional MR images were interpreted, and findings are
reported in the following complete MRI report for this study. Three
dimensional images were evaluated at the independent [employer]
workstation.
FINDINGS: Breast composition: c. Heterogeneous fibroglandular tissue.

Background parenchymal enhancement: Moderate.

Right breast: No suspicious mass or abnormal enhancement.

Left breast: Susceptibility artifact from post biopsy clip is seen
in association with an irregular, enhancing mass in the subareolar
left breast (series 6, image 56/112). This is consistent with the
patient's biopsy-proven malignancy. The mass measures 4.2 x 3.5 x
3.5 cm. Enhancement from the mass extends to the level of the
overlying left nipple which is retracted. There is mild skin
thickening along the periareolar aspect. Otherwise, no suspicious
mass or abnormal enhancement in the remainder of the left breast.

Lymph nodes: There are 6 morphologically abnormal level I left
axillary lymph nodes, one of which demonstrates susceptibility
artifact from post biopsy clip. No enlarged level II or III left
axillary lymph nodes noted within the limits of this study. No
suspicious internal mammary chain or right axillary lymph nodes.

Ancillary findings:  None.
IMPRESSION: 1. 4.2 cm enhancing mass in the subareolar left breast consistent
with the patient's biopsy-proven site of malignancy. Enhancement
extends to the level of the left nipple with associated nipple
retraction and periareolar skin thickening, suggesting involvement.
2. Six morphologically abnormal level I left axillary lymph nodes.
3. No MRI evidence of malignancy on the right.

RECOMMENDATION:
Per clinical treatment plan.

BI-RADS CATEGORY  6: Known biopsy-proven malignancy.

## 2020-07-26 NOTE — Progress Notes (Deleted)
Pharmacist Chemotherapy Monitoring - Initial Assessment    Anticipated start date: 07/31/96  Regimen:  . Are orders appropriate based on the patient's diagnosis, regimen, and cycle? {yes/no:20286} . Does the plan date match the patient's scheduled date? {yes/no:20286} . Is the sequencing of drugs appropriate? {yes/no:20286} . Are the premedications appropriate for the patient's regimen? {yes/no:20286} . Prior Authorization for treatment is: {Rx chemo PA MHWKGS:81103} o If applicable, is the correct biosimilar selected based on the patient's insurance? {YES/NO/NOT APPLICABLE:20182}  Organ Function and Labs: Marland Kitchen Are dose adjustments needed based on the patient's renal function, hepatic function, or hematologic function? {yes/no:20286} . Are appropriate labs ordered prior to the start of patient's treatment? {yes/no:20286} . Other organ system assessment, if indicated: {Rx chemo organ:23687} . The following baseline labs, if indicated, have been ordered: {Rx chemo other labs:23685}  Dose Assessment: . Are the drug doses appropriate? {yes/no:20286} . Are the following correct: o Drug concentrations {yes/no:20286} o IV fluid compatible with drug {yes/no:20286} o Administration routes {yes/no:20286} o Timing of therapy Yes . If applicable, does the patient have documented access for treatment and/or plans for port-a-cath placement? yes . If applicable, have lifetime cumulative doses been properly documented and assessed? not applicable Lifetime Dose Tracking  No doses have been documented on this patient for the following tracked chemicals: Doxorubicin, Epirubicin, Idarubicin, Daunorubicin, Mitoxantrone, Bleomycin, Oxaliplatin, Carboplatin, Liposomal Doxorubicin  o   Toxicity Monitoring/Prevention: . The patient has the following take home antiemetics prescribed: Prochlorperazine . The patient has the following take home medications prescribed: N/A . Medication allergies and previous infusion  related reactions, if applicable, have been reviewed and addressed. No . The patient's current medication list has been assessed for drug-drug interactions with their chemotherapy regimen. no significant drug-drug interactions were identified on review.  Order Review: . Are the treatment plan orders signed? No . Is the patient scheduled to see a provider prior to their treatment? No  I verify that I have reviewed each item in the above checklist and answered each question accordingly.  Elizabeth Graves [facility] 07/26/2020 1:51 PM

## 2020-07-27 ENCOUNTER — Other Ambulatory Visit: Payer: Self-pay

## 2020-07-27 ENCOUNTER — Inpatient Hospital Stay (HOSPITAL_COMMUNITY): Payer: BC Managed Care – PPO

## 2020-07-27 DIAGNOSIS — Z95828 Presence of other vascular implants and grafts: Secondary | ICD-10-CM

## 2020-07-27 DIAGNOSIS — C50919 Malignant neoplasm of unspecified site of unspecified female breast: Secondary | ICD-10-CM

## 2020-07-27 NOTE — Progress Notes (Signed)

## 2020-07-30 ENCOUNTER — Other Ambulatory Visit: Payer: Self-pay | Admitting: Family Medicine

## 2020-07-31 ENCOUNTER — Ambulatory Visit (HOSPITAL_COMMUNITY)
Admission: RE | Admit: 2020-07-31 | Discharge: 2020-07-31 | Disposition: A | Discharge status: Home / Self Care | Payer: BC Managed Care – PPO | Source: Ambulatory Visit | Attending: Hematology | Admitting: Hematology

## 2020-07-31 ENCOUNTER — Other Ambulatory Visit (HOSPITAL_COMMUNITY): Payer: Self-pay | Admitting: Hematology

## 2020-07-31 ENCOUNTER — Inpatient Hospital Stay (HOSPITAL_COMMUNITY): Payer: BC Managed Care – PPO | Attending: Hematology

## 2020-07-31 ENCOUNTER — Other Ambulatory Visit: Payer: Self-pay

## 2020-07-31 ENCOUNTER — Inpatient Hospital Stay (HOSPITAL_BASED_OUTPATIENT_CLINIC_OR_DEPARTMENT_OTHER): Payer: BC Managed Care – PPO | Admitting: Hematology

## 2020-07-31 ENCOUNTER — Inpatient Hospital Stay (HOSPITAL_COMMUNITY): Payer: BC Managed Care – PPO

## 2020-07-31 VITALS — BP 134/74 | HR 83 | Temp 97.3°F | Resp 18 | Wt 165.4 lb

## 2020-07-31 DIAGNOSIS — Z452 Encounter for adjustment and management of vascular access device: Secondary | ICD-10-CM

## 2020-07-31 DIAGNOSIS — Z171 Estrogen receptor negative status [ER-]: Secondary | ICD-10-CM | POA: Diagnosis not present

## 2020-07-31 DIAGNOSIS — C50012 Malignant neoplasm of nipple and areola, left female breast: Secondary | ICD-10-CM | POA: Diagnosis present

## 2020-07-31 DIAGNOSIS — Z8541 Personal history of malignant neoplasm of cervix uteri: Secondary | ICD-10-CM | POA: Insufficient documentation

## 2020-07-31 DIAGNOSIS — Z803 Family history of malignant neoplasm of breast: Secondary | ICD-10-CM | POA: Insufficient documentation

## 2020-07-31 DIAGNOSIS — E876 Hypokalemia: Secondary | ICD-10-CM | POA: Insufficient documentation

## 2020-07-31 DIAGNOSIS — C50919 Malignant neoplasm of unspecified site of unspecified female breast: Secondary | ICD-10-CM

## 2020-07-31 DIAGNOSIS — R110 Nausea: Secondary | ICD-10-CM | POA: Insufficient documentation

## 2020-07-31 DIAGNOSIS — Z800 Family history of malignant neoplasm of digestive organs: Secondary | ICD-10-CM | POA: Insufficient documentation

## 2020-07-31 DIAGNOSIS — N6453 Retraction of nipple: Secondary | ICD-10-CM | POA: Insufficient documentation

## 2020-07-31 DIAGNOSIS — Z5111 Encounter for antineoplastic chemotherapy: Secondary | ICD-10-CM | POA: Diagnosis not present

## 2020-07-31 DIAGNOSIS — Z95828 Presence of other vascular implants and grafts: Secondary | ICD-10-CM

## 2020-07-31 DIAGNOSIS — Z87891 Personal history of nicotine dependence: Secondary | ICD-10-CM | POA: Diagnosis not present

## 2020-07-31 DIAGNOSIS — Z801 Family history of malignant neoplasm of trachea, bronchus and lung: Secondary | ICD-10-CM | POA: Diagnosis not present

## 2020-07-31 LAB — CBC WITH DIFFERENTIAL/PLATELET
Abs Immature Granulocytes: 0.03 10*3/uL (ref 0.00–0.07)
Basophils Absolute: 0.1 10*3/uL (ref 0.0–0.1)
Basophils Relative: 1 %
Eosinophils Absolute: 0.3 10*3/uL (ref 0.0–0.5)
Eosinophils Relative: 3 %
HCT: 39.1 % (ref 36.0–46.0)
Hemoglobin: 13.1 g/dL (ref 12.0–15.0)
Immature Granulocytes: 0 %
Lymphocytes Relative: 25 %
Lymphs Abs: 2.1 10*3/uL (ref 0.7–4.0)
MCH: 27.9 pg (ref 26.0–34.0)
MCHC: 33.5 g/dL (ref 30.0–36.0)
MCV: 83.2 fL (ref 80.0–100.0)
Monocytes Absolute: 0.5 10*3/uL (ref 0.1–1.0)
Monocytes Relative: 6 %
Neutro Abs: 5.5 10*3/uL (ref 1.7–7.7)
Neutrophils Relative %: 65 %
Platelets: 334 10*3/uL (ref 150–400)
RBC: 4.7 MIL/uL (ref 3.87–5.11)
RDW: 13.5 % (ref 11.5–15.5)
WBC: 8.5 10*3/uL (ref 4.0–10.5)
nRBC: 0 % (ref 0.0–0.2)

## 2020-07-31 LAB — COMPREHENSIVE METABOLIC PANEL
ALT: 23 U/L (ref 0–44)
AST: 16 U/L (ref 15–41)
Albumin: 3.8 g/dL (ref 3.5–5.0)
Alkaline Phosphatase: 85 U/L (ref 38–126)
Anion gap: 9 (ref 5–15)
BUN: 14 mg/dL (ref 6–20)
CO2: 24 mmol/L (ref 22–32)
Calcium: 9.2 mg/dL (ref 8.9–10.3)
Chloride: 104 mmol/L (ref 98–111)
Creatinine, Ser: 0.97 mg/dL (ref 0.44–1.00)
GFR calc Af Amer: >60 mL/min (ref >60)
GFR calc non Af Amer: >60 mL/min (ref >60)
Glucose, Bld: 181 mg/dL — ABNORMAL HIGH (ref 70–99)
Potassium: 3.2 mmol/L — ABNORMAL LOW (ref 3.5–5.1)
Sodium: 137 mmol/L (ref 135–145)
Total Bilirubin: 0.5 mg/dL (ref 0.3–1.2)
Total Protein: 7.2 g/dL (ref 6.5–8.1)

## 2020-07-31 MED ORDER — SODIUM CHLORIDE FLUSH 0.9 % IV SOLN
INTRAVENOUS | Status: AC
  Filled 2020-07-31: qty 10

## 2020-07-31 MED ORDER — SODIUM CHLORIDE 0.9 % IV SOLN: Freq: Once | INTRAVENOUS | Status: AC

## 2020-07-31 MED ORDER — ALTEPLASE 2 MG IJ SOLR
2.0000 mg | Freq: Once | INTRAMUSCULAR | Status: AC | PRN
  Administered 2020-07-31: 2 mg

## 2020-07-31 MED ORDER — STERILE WATER FOR INJECTION IJ SOLN
INTRAMUSCULAR | Status: AC
  Filled 2020-07-31: qty 10

## 2020-07-31 MED ORDER — IOHEXOL 300 MG/ML  SOLN
10.0000 mL | Freq: Once | INTRAMUSCULAR | Status: AC | PRN
  Administered 2020-07-31: 10 mL

## 2020-07-31 MED ORDER — ALTEPLASE 2 MG IJ SOLR
INTRAMUSCULAR | Status: AC
  Filled 2020-07-31: qty 2

## 2020-07-31 MED ORDER — ALTEPLASE 2 MG IJ SOLR
2.0000 mg | Freq: Once | INTRAMUSCULAR | Status: AC
  Administered 2020-07-31: 2 mg

## 2020-07-31 NOTE — Telephone Encounter (Signed)
90-day refill needs follow-up by early October

## 2020-07-31 NOTE — Progress Notes (Signed)
Elizabeth Graves, [name] [address]   CLINIC:  Medical Oncology/Hematology  PCP:  Kathyrn Drown, MD [place] / [place] [address] [phone]   REASON FOR VISIT:  Follow-up for breast cancer  PRIOR THERAPY: None  NGS Results: Not done  CURRENT THERAPY: Dose-dense Adriamycin and cyclophosphamide  BRIEF ONCOLOGIC HISTORY:  Oncology History  Triple negative malignant neoplasm of breast ([facility])  07/05/2020 Initial Diagnosis   Triple negative malignant neoplasm of breast ([facility])   07/27/2020 -  Chemotherapy   The patient had DOXOrubicin (ADRIAMYCIN) chemo injection 104 mg, 60 mg/m2, Intravenous,  Once, 0 of 4 cycles palonosetron (ALOXI) injection 0.25 mg, 0.25 mg, Intravenous,  Once, 0 of 8 cycles pegfilgrastim-jmdb (FULPHILA) injection 6 mg, 6 mg, Subcutaneous,  Once, 0 of 4 cycles CARBOplatin (PARAPLATIN) in sodium chloride 0.9 % 100 mL chemo infusion, , Intravenous,  Once, 0 of 4 cycles cyclophosphamide (CYTOXAN) 1,040 mg in sodium chloride 0.9 % 250 mL chemo infusion, 600 mg/m2, Intravenous,  Once, 0 of 4 cycles PACLitaxel (TAXOL) 138 mg in sodium chloride 0.9 % 250 mL chemo infusion (</= '80mg'$ /m2), 80 mg/m2, Intravenous,  Once, 0 of 4 cycles fosaprepitant (EMEND) 150 mg in sodium chloride 0.9 % 145 mL IVPB, 150 mg, Intravenous,  Once, 0 of 8 cycles  for chemotherapy treatment.      CANCER STAGING: Cancer Staging No matching staging information was found for the patient.  INTERVAL HISTORY:  Ms. CAROLLE Graves, a 54 y.o. female, returns for routine follow-up of her breast cancer. Elizabeth Graves was last seen on 07/24/2020.   Due for initiating cycle #1 of Adriamycin and cyclophosphamide today.  Today she is accompanied by her husband. She reports that her port is irritating and burning, especially after flushes; the port does not bother her when she is at home or sleeping, only when it is accessed. She does not have any breathing  issues.   REVIEW OF SYSTEMS:  Review of Systems  Constitutional: Positive for fatigue. Negative for appetite change.  Respiratory: Negative for shortness of breath.   All other systems reviewed and are negative.   PAST MEDICAL/SURGICAL HISTORY:  Past Medical History:  Diagnosis Date   Asthma    Cancer ([facility])    cervical - 20 yrs ago   Carpal tunnel syndrome    Depression    Family history of breast cancer    Family history of esophageal cancer    Family history of lung cancer    Family history of throat cancer    Hypertension    Hypokalemia    tx w/ k-Dur   Port-A-Cath in place 07/24/2020   Seasonal allergies    tx with benadryl prn   SVD (spontaneous vaginal delivery)    x 2   Past Surgical History:  Procedure Laterality Date   ABDOMINAL HYSTERECTOMY     CO2 Laser of Cervix     for cervical cancer 20 yrs ago   DILATION AND CURETTAGE OF UTERUS  12/2010   polyp removed   DILATION AND CURETTAGE OF UTERUS     x 3 for MAB   LAPAROSCOPIC ASSISTED VAGINAL HYSTERECTOMY  06/09/2012   Procedure: LAPAROSCOPIC ASSISTED VAGINAL HYSTERECTOMY;  Surgeon: Gus Height, MD;  Location: [facility];  Service: Gynecology;  Laterality: N/A;   PORTACATH PLACEMENT Right 07/17/2020   Procedure: INSERTION PORT-A-CATH;  Surgeon: Virl Cagey, MD;  Location: [facility];  Service: General;  Laterality: Right;   SALPINGOOPHORECTOMY  06/09/2012   Procedure: SALPINGO OOPHERECTOMY;  Surgeon: Gus Height, MD;  Location: [facility];  Service: Gynecology;  Laterality: Bilateral;   TUBAL LIGATION      SOCIAL HISTORY:  Social History   Socioeconomic History   Marital status: Married    Spouse name: Not on file   Number of children: 2   Years of education: Not on file   Highest education level: Not on file  Occupational History   Occupation: EMPLOYED  Tobacco Use   Smoking status: Former Smoker    Packs/day: 1.00    Years: 16.00    Pack years: 16.00    Types: Cigarettes     Start date: 05/30/1994    Quit date: 01/31/2020    Years since quitting: 0.4   Smokeless tobacco: Never Used  Vaping Use   Vaping Use: Never used  Substance and Sexual Activity   Alcohol use: No   Drug use: No   Sexual activity: Yes    Birth control/protection: Surgical  Other Topics Concern   Not on file  Social History Narrative   Not on file   Social Determinants of Health   Financial Resource Strain: Low Risk    Difficulty of Paying Living Expenses: Not very hard  Food Insecurity: No Food Insecurity   Worried About Running Out of Food in the Last Year: Never true   Ran Out of Food in the Last Year: Never true  Transportation Needs: No Transportation Needs   Lack of Transportation (Medical): No   Lack of Transportation (Non-Medical): No  Physical Activity: Inactive   Days of Exercise per Week: 0 days   Minutes of Exercise per Session: 0 min  Stress: Stress Concern Present   Feeling of Stress : To some extent  Social Connections: Moderately Isolated   Frequency of Communication with Friends and Family: Three times a week   Frequency of Social Gatherings with Friends and Family: Once a week   Attends Religious Services: Never   Marine scientist or Organizations: No   Attends Music therapist: Never   Marital Status: Married  Human resources officer Violence: Not At Risk   Fear of Current or Ex-Partner: No   Emotionally Abused: No   Physically Abused: No   Sexually Abused: No    FAMILY HISTORY:  Family History  Problem Relation Age of Onset   Heart disease Mother    Lung cancer Mother 82       smoker   Heart disease Father    Esophageal cancer Father        GE junction, dx. in his early 62s   Diverticulitis Sister    Diabetes Sister    Diabetes Maternal Grandmother    Heart disease Maternal Grandmother    Throat cancer Maternal Grandmother        dx. late 45s   Healthy Sister    Healthy Daughter    Healthy Son     Breast cancer Paternal Aunt        limited info    CURRENT MEDICATIONS:  Current Outpatient Medications  Medication Sig Dispense Refill   atorvastatin (LIPITOR) 20 MG tablet TAKE 1 TABLET BY MOUTH EVERY DAY 90 tablet 1   CYCLOPHOSPHAMIDE IV Inject into the vein every 14 (fourteen) days.     DOXOrubicin HCl (ADRIAMYCIN IV) Inject into the vein every 14 (fourteen) days.     esomeprazole (NEXIUM) 20 MG capsule Take 20 mg by mouth daily before breakfast.     lisinopril (  ZESTRIL) 5 MG tablet TAKE 1 TABLET BY MOUTH EVERY DAY 90 tablet 1   [START ON 08/02/2020] pegfilgrastim-jmdb (FULPHILA) 6 MG/0.6ML injection Inject 6 mg into the skin every 14 (fourteen) days.     sertraline (ZOLOFT) 50 MG tablet TAKE 1 TABLET BY MOUTH EVERY DAY (Patient taking differently: Take 50 mg by mouth at bedtime. ) 90 tablet 0   albuterol (VENTOLIN HFA) 108 (90 Base) MCG/ACT inhaler TAKE 2 PUFFS BY MOUTH EVERY 6 HOURS AS NEEDED FOR WHEEZE OR SHORTNESS OF BREATH (Patient not taking: Reported on 07/31/2020) 8.5 Inhaler 0   diphenhydrAMINE (BENADRYL) 25 mg capsule Take 25 mg by mouth every 6 (six) hours as needed for allergies. Allergies. (Patient not taking: Reported on 07/31/2020)     ibuprofen (ADVIL,MOTRIN) 200 MG tablet Take 400-600 mg by mouth every 8 (eight) hours as needed for moderate pain (pain.).  (Patient not taking: Reported on 07/31/2020)     lidocaine-prilocaine (EMLA) cream Apply a pea sized amount to port a cath site and cover with plastic wrap 1 hour prior to chemotherapy appointments (Patient not taking: Reported on 07/31/2020) 30 g 3   naproxen sodium (ALEVE) 220 MG tablet Take 440 mg by mouth 3 (three) times daily as needed (pain.).  (Patient not taking: Reported on 07/31/2020)     prochlorperazine (COMPAZINE) 10 MG tablet Take 1 tablet (10 mg total) by mouth every 6 (six) hours as needed (Nausea or vomiting). (Patient not taking: Reported on 07/31/2020) 30 tablet 1   No current facility-administered  medications for this visit.    ALLERGIES:  No Known Allergies  PHYSICAL EXAM:  Performance status (ECOG): 0 - Asymptomatic  Vitals:   07/31/20 0839  BP: (!) 134/74  Pulse: 83  Resp: 18  Temp: (!) 97.3 F (36.3 C)  SpO2: 97%   Wt Readings from Last 3 Encounters:  07/31/20 165 lb 6.4 oz (75 kg)  07/24/20 165 lb 5.5 oz (75 kg)  07/05/20 166 lb (75.3 kg)   Physical Exam Vitals reviewed.  Constitutional:      Appearance: Normal appearance. She is obese.  Chest:     Comments: Port-a-Cath on R chest Neurological:     General: No focal deficit present.     Mental Status: She is alert and oriented to person, place, and time.  Psychiatric:        Mood and Affect: Mood normal.        Behavior: Behavior normal.      LABORATORY DATA:  I have reviewed the labs as listed.  CBC Latest Ref Rng & Units 07/05/2020 09/30/2014 04/25/2014  WBC 4.0 - 10.5 K/uL 9.4 8.1 10.8(H)  Hemoglobin 12.0 - 15.0 g/dL 13.4 14.6 14.8  Hematocrit 36 - 46 % 40.5 42.9 42.5  Platelets 150 - 400 K/uL 365 328 338   CMP Latest Ref Rng & Units 07/05/2020 03/06/2020 06/26/2018  Glucose 70 - 99 mg/dL 114(H) 130(H) -  BUN 6 - 20 mg/dL 15 16 -  Creatinine 0.44 - 1.00 mg/dL 0.95 0.98 -  Sodium 135 - 145 mmol/L 137 138 -  Potassium 3.5 - 5.1 mmol/L 3.8 4.9 -  Chloride 98 - 111 mmol/L 103 103 -  CO2 22 - 32 mmol/L 25 20 -  Calcium 8.9 - 10.3 mg/dL 9.2 9.9 -  Total Protein 6.5 - 8.1 g/dL 7.6 7.1 6.7  Total Bilirubin 0.3 - 1.2 mg/dL 0.4 0.2 0.3  Alkaline Phos 38 - 126 U/L 86 99 95  AST 15 - 41 U/L  $'17 23 18  'G$ ALT 0 - 44 U/L '24 29 20    '$ DIAGNOSTIC IMAGING:  I have independently reviewed the scans and discussed with the patient. CT Chest W Contrast  Result Date: 07/14/2020 CLINICAL DATA:  Triple negative left breast cancer, newly diagnosed EXAM: CT CHEST, ABDOMEN, AND PELVIS WITH CONTRAST TECHNIQUE: Multidetector CT imaging of the chest, abdomen and pelvis was performed following the standard protocol during bolus  administration of intravenous contrast. CONTRAST:  140m OMNIPAQUE IOHEXOL 300 MG/ML  SOLN COMPARISON:  None. FINDINGS: CT CHEST FINDINGS Cardiovascular: Heart is normal in size.  No pericardial effusion. No evidence of thoracic aortic aneurysm. Atherosclerotic calcifications of the aortic arch. Mild coronary atherosclerosis of the LAD. Mediastinum/Nodes: Dominant left axillary nodes measuring up to 1.8 cm short axis (series 2/image 16). Small left subpectoral nodes measuring up to 7 mm short axis (series 2/image 13), suspicious. Additional small thoracic nodes, including a 7 mm short axis node at the left thoracic inlet (series 2/image 11), an 8 mm short axis right axillary node (series 2/image 25), and a 9 mm short axis subcarinal node (series 2/image 24), all of which are favored to be reactive. Visualized thyroid is notable for subcentimeter right thyroid nodules, benign. Not clinically significant; no follow-up imaging recommended (ref: J Am Coll Radiol. 2015 Feb;12(2): 143-50). Lungs/Pleura: No suspicious pulmonary nodules. Mild ground-glass opacities in the bilateral lower lobes, favoring atelectasis. No focal consolidation. No pleural effusion or pneumothorax. Musculoskeletal: 3.5 x 2.5 cm mass in the central left breast (series 2/image 27), corresponding to the patient's known left breast neoplasm. Very mild degenerative changes of the mid/lower thoracic spine. CT ABDOMEN PELVIS FINDINGS Hepatobiliary: Liver is within normal limits. No suspicious/enhancing hepatic lesions. Gallbladder is unremarkable. No intrahepatic or extrahepatic ductal dilatation. Pancreas: Within normal limits. Spleen: Within normal limits. Adrenals/Urinary Tract: Adrenal glands are within normal limits. Kidneys are within normal limits.  No hydronephrosis. Bladder is within normal limits. Stomach/Bowel: Stomach is within normal limits. No evidence of bowel obstruction. Normal appendix (series 2/image 90). Vascular/Lymphatic: No  evidence of abdominal aortic aneurysm. Atherosclerotic calcifications of the abdominal aorta and branch vessels. No suspicious abdominopelvic lymphadenopathy. Reproductive: Status post hysterectomy. No adnexal masses. Other: No abdominopelvic ascites. Musculoskeletal: Very mild degenerative changes of the lower lumbar spine. IMPRESSION: 3.5 cm central left breast mass, corresponding to the patient's known left breast neoplasm. Left axillary and left subpectoral nodal metastases, as above. Additional small thoracic nodes are technically within normal limits and favored to be reactive, but warrant attention on follow-up. No evidence of metastatic disease in the abdomen/pelvis. Electronically Signed   By: SJulian HyM.D.   On: 07/14/2020 15:09   CT Abdomen Pelvis W Contrast  Result Date: 07/14/2020 CLINICAL DATA:  Triple negative left breast cancer, newly diagnosed EXAM: CT CHEST, ABDOMEN, AND PELVIS WITH CONTRAST TECHNIQUE: Multidetector CT imaging of the chest, abdomen and pelvis was performed following the standard protocol during bolus administration of intravenous contrast. CONTRAST:  1065mOMNIPAQUE IOHEXOL 300 MG/ML  SOLN COMPARISON:  None. FINDINGS: CT CHEST FINDINGS Cardiovascular: Heart is normal in size.  No pericardial effusion. No evidence of thoracic aortic aneurysm. Atherosclerotic calcifications of the aortic arch. Mild coronary atherosclerosis of the LAD. Mediastinum/Nodes: Dominant left axillary nodes measuring up to 1.8 cm short axis (series 2/image 16). Small left subpectoral nodes measuring up to 7 mm short axis (series 2/image 13), suspicious. Additional small thoracic nodes, including a 7 mm short axis node at the left thoracic inlet (series 2/image 11), an  8 mm short axis right axillary node (series 2/image 25), and a 9 mm short axis subcarinal node (series 2/image 24), all of which are favored to be reactive. Visualized thyroid is notable for subcentimeter right thyroid nodules,  benign. Not clinically significant; no follow-up imaging recommended (ref: J Am Coll Radiol. 2015 Feb;12(2): 143-50). Lungs/Pleura: No suspicious pulmonary nodules. Mild ground-glass opacities in the bilateral lower lobes, favoring atelectasis. No focal consolidation. No pleural effusion or pneumothorax. Musculoskeletal: 3.5 x 2.5 cm mass in the central left breast (series 2/image 27), corresponding to the patient's known left breast neoplasm. Very mild degenerative changes of the mid/lower thoracic spine. CT ABDOMEN PELVIS FINDINGS Hepatobiliary: Liver is within normal limits. No suspicious/enhancing hepatic lesions. Gallbladder is unremarkable. No intrahepatic or extrahepatic ductal dilatation. Pancreas: Within normal limits. Spleen: Within normal limits. Adrenals/Urinary Tract: Adrenal glands are within normal limits. Kidneys are within normal limits.  No hydronephrosis. Bladder is within normal limits. Stomach/Bowel: Stomach is within normal limits. No evidence of bowel obstruction. Normal appendix (series 2/image 90). Vascular/Lymphatic: No evidence of abdominal aortic aneurysm. Atherosclerotic calcifications of the abdominal aorta and branch vessels. No suspicious abdominopelvic lymphadenopathy. Reproductive: Status post hysterectomy. No adnexal masses. Other: No abdominopelvic ascites. Musculoskeletal: Very mild degenerative changes of the lower lumbar spine. IMPRESSION: 3.5 cm central left breast mass, corresponding to the patient's known left breast neoplasm. Left axillary and left subpectoral nodal metastases, as above. Additional small thoracic nodes are technically within normal limits and favored to be reactive, but warrant attention on follow-up. No evidence of metastatic disease in the abdomen/pelvis. Electronically Signed   By: Julian Hy M.D.   On: 07/14/2020 15:09   MR BREAST BILATERAL W WO CONTRAST INC CAD  Result Date: 07/19/2020 CLINICAL DATA:  54 year old female with newly diagnosed  left breast invasive ductal carcinoma and left axillary metastases. LABS:  None performed on site. EXAM: BILATERAL BREAST MRI WITH AND WITHOUT CONTRAST TECHNIQUE: Multiplanar, multisequence MR images of both breasts were obtained prior to and following the intravenous administration of 8 ml of Gadavist. Three-dimensional MR images were rendered by post-processing of the original MR data on an independent workstation. The three-dimensional MR images were interpreted, and findings are reported in the following complete MRI report for this study. Three dimensional images were evaluated at the independent DynaCad workstation. COMPARISON:  Previous exam(s). FINDINGS: Breast composition: c. Heterogeneous fibroglandular tissue. Background parenchymal enhancement: Moderate. Right breast: No suspicious mass or abnormal enhancement. Left breast: Susceptibility artifact from post biopsy clip is seen in association with an irregular, enhancing mass in the subareolar left breast (series 6, image 56/112). This is consistent with the patient's biopsy-proven malignancy. The mass measures 4.2 x 3.5 x 3.5 cm. Enhancement from the mass extends to the level of the overlying left nipple which is retracted. There is mild skin thickening along the periareolar aspect. Otherwise, no suspicious mass or abnormal enhancement in the remainder of the left breast. Lymph nodes: There are 6 morphologically abnormal level I left axillary lymph nodes, one of which demonstrates susceptibility artifact from post biopsy clip. No enlarged level II or III left axillary lymph nodes noted within the limits of this study. No suspicious internal mammary chain or right axillary lymph nodes. Ancillary findings:  None. IMPRESSION: 1. 4.2 cm enhancing mass in the subareolar left breast consistent with the patient's biopsy-proven site of malignancy. Enhancement extends to the level of the left nipple with associated nipple retraction and periareolar skin  thickening, suggesting involvement. 2. Six morphologically abnormal level I  left axillary lymph nodes. 3. No MRI evidence of malignancy on the right. RECOMMENDATION: Per clinical treatment plan. BI-RADS CATEGORY  6: Known biopsy-proven malignancy. Electronically Signed   By: Kristopher Oppenheim M.D.   On: 07/19/2020 09:43   DG Chest Port 1 View  Result Date: 07/17/2020 CLINICAL DATA:  Port insertion EXAM: PORTABLE CHEST 1 VIEW COMPARISON:  03/22/2015, 07/14/2020 FINDINGS: Interval placement of right subclavian approach Port-A-Cath with distal tip terminating at the level of the distal SVC. Stable heart size. Atherosclerotic calcification of the aortic knob. No new focal airspace consolidation. No pleural effusion or pneumothorax. IMPRESSION: Interval placement of right subclavian approach Port-A-Cath. No pneumothorax. Electronically Signed   By: Davina Poke D.O.   On: 07/17/2020 10:32   DG C-Arm 1-60 Min-No Report  Result Date: 07/17/2020 Fluoroscopy was utilized by the requesting physician.  No radiographic interpretation.   ECHOCARDIOGRAM COMPLETE  Result Date: 07/12/2020    ECHOCARDIOGRAM REPORT   Patient Name:   Elizabeth Graves Date of Exam: 07/12/2020 Medical Rec #:  [id number]      Height:       57.0 in Accession #:    [id number]     Weight:       166.0 lb Date of Birth:  06-14-1966     BSA:          1.662 m Patient Age:    54 years       BP:           129/82 mmHg Patient Gender: F              HR:           85 bpm. Exam Location:  [facility] Procedure: 2D Echo, Cardiac Doppler and Color Doppler Indications:    C50.912 (ICD-10-CM) - Invasive ductal carcinoma of breast,                 female, left  History:        Patient has no prior history of Echocardiogram examinations.                 Risk Factors:Hypertension and Diabetes. Malignant neoplasm of                 areola of left breast in female, estrogen receptor negative.  Sonographer:    Alvino Chapel RCS Referring Phys: [phone] [place]  1. Left ventricular ejection fraction, by estimation, is 60 to 65%. The left ventricle has normal function. The left ventricle has no regional wall motion abnormalities. Left ventricular diastolic parameters were normal.  2. Right ventricular systolic function is normal. The right ventricular size is normal. There is mildly elevated pulmonary artery systolic pressure.  3. The mitral valve is normal in structure. No evidence of mitral valve regurgitation. No evidence of mitral stenosis.  4. The aortic valve is tricuspid. Aortic valve regurgitation is not visualized. No aortic stenosis is present.  5. The inferior vena cava is normal in size with greater than 50% respiratory variability, suggesting right atrial pressure of 3 mmHg. FINDINGS  Left Ventricle: Left ventricular ejection fraction, by estimation, is 60 to 65%. The left ventricle has normal function. The left ventricle has no regional wall motion abnormalities. The left ventricular internal cavity size was normal in size. There is  no left ventricular hypertrophy. Left ventricular diastolic parameters were normal. Right Ventricle: The right ventricular size is normal. No increase in right ventricular wall thickness. Right ventricular systolic function is normal.  There is mildly elevated pulmonary artery systolic pressure. The tricuspid regurgitant velocity is 2.57  m/s, and with an assumed right atrial pressure of 10 mmHg, the estimated right ventricular systolic pressure is 10.9 mmHg. Left Atrium: Left atrial size was normal in size. Right Atrium: Right atrial size was normal in size. Pericardium: There is no evidence of pericardial effusion. Mitral Valve: The mitral valve is normal in structure. No evidence of mitral valve regurgitation. No evidence of mitral valve stenosis. Tricuspid Valve: The tricuspid valve is normal in structure. Tricuspid valve regurgitation is mild . No evidence of tricuspid stenosis. Aortic Valve: The aortic  valve is tricuspid. Aortic valve regurgitation is not visualized. No aortic stenosis is present. Aortic valve mean gradient measures 6.8 mmHg. Aortic valve peak gradient measures 14.8 mmHg. Aortic valve area, by VTI measures 1.97  cm. Pulmonic Valve: The pulmonic valve was not well visualized. Pulmonic valve regurgitation is trivial. No evidence of pulmonic stenosis. Aorta: The aortic root is normal in size and structure. Pulmonary Artery: No significant pulmonary HTN, PASP is 27 mmHg. Venous: The inferior vena cava is normal in size with greater than 50% respiratory variability, suggesting right atrial pressure of 3 mmHg. IAS/Shunts: No atrial level shunt detected by color flow Doppler.  LEFT VENTRICLE PLAX 2D LVIDd:         4.03 cm  Diastology LVIDs:         2.19 cm  LV e' lateral:   10.80 cm/s LV PW:         0.98 cm  LV E/e' lateral: 7.8 LV IVS:        1.05 cm  LV e' medial:    7.07 cm/s LVOT diam:     1.70 cm  LV E/e' medial:  11.9 LV SV:         69 LV SV Index:   41 LVOT Area:     2.27 cm  RIGHT VENTRICLE RV S prime:     11.00 cm/s TAPSE (M-mode): 2.2 cm LEFT ATRIUM             Index       RIGHT ATRIUM           Index LA diam:        3.20 cm 1.93 cm/m  RA Area:     12.10 cm LA Vol (A2C):   40.5 ml 24.37 ml/m RA Volume:   26.30 ml  15.83 ml/m LA Vol (A4C):   34.6 ml 20.82 ml/m LA Biplane Vol: 37.6 ml 22.63 ml/m  AORTIC VALVE AV Area (Vmax):    1.88 cm AV Area (Vmean):   1.85 cm AV Area (VTI):     1.97 cm AV Vmax:           192.07 cm/s AV Vmean:          123.805 cm/s AV VTI:            0.349 m AV Peak Grad:      14.8 mmHg AV Mean Grad:      6.8 mmHg LVOT Vmax:         159.00 cm/s LVOT Vmean:        101.000 cm/s LVOT VTI:          0.303 m LVOT/AV VTI ratio: 0.87  AORTA Ao Root diam: 2.90 cm MITRAL VALVE               TRICUSPID VALVE MV Area (PHT): 2.42 cm    TR Peak grad:  26.4 mmHg MV Decel Time: 313 msec    TR Vmax:        257.00 cm/s MV E velocity: 84.30 cm/s MV A velocity: 93.20 cm/s  SHUNTS MV  E/A ratio:  0.90        Systemic VTI:  0.30 m                            Systemic Diam: 1.70 cm Carlyle Dolly MD Electronically signed by Carlyle Dolly MD Signature Date/Time: 07/12/2020/4:24:57 PM    Final      ASSESSMENT:  1. Triple negative left breast cancer (T2N1): -Patient identified left breast mass with left nipple retraction about 2 months ago. -Mammogram on 06/13/2020 showed mass in the retroareolar 12 o'clock position in the left breast. 2 suspicious left axillary lymph nodes, largest measuring 2 cm. -Breast ultrasound showed 2.7 x 1.6 x 2.2 cm mass in the 12:00 retroareolar position. Just lateral to the mass is a calcified hypoechoic nodule that corresponds to densely calcified degenerating fibroadenoma. -Left breast 12:00 biopsy showed invasive ductal carcinoma, grade 2/3. Left axillary lymph node biopsy was consistent with meta stasis. -ER negative, PR negative and Ki-67 15%. HER-2 equivocal by IHC. HER-2 FISH negative. -MRI of the breast on 07/19/2020 shows 4.2 cm enhancing mass in the subareolar left breast, enhancement extending to the level of the left nipple with associated nipple retraction and periareolar skin thickening.  6 morphologically abnormal level 1 left axillary lymph nodes.  No evidence of malignancy in the right breast. -CT CAP on 07/14/2020 shows dominant left axillary nodes measuring 1.8 cm short axis.  Small left subpectoral nodes measuring up to 7 mm short axis, suspicious.  Small mediastinal lymph nodes measuring 7 to 9 mm, thought to be reactive.  No other evidence of metastatic disease.  2. Family history: -Father had gastroesophageal junction cancer. Maternal grandmother had throat cancer and paternal aunt had breast cancer.  3.  High risk drug monitoring: -Echo on 07/12/2020 shows EF 60 to 65%.   PLAN:  1. T2N1 left breast TNBC: -Her port did not give blood return.  She also had burning type of pain on flushing of saline. -We have obtained a  portogram.  It showed formation of membrane with dye seen 5 cm above the tip.  We have put in Cathflo without success at 1 hour. -We have given another dose and left and to do well overnight.  We will check tomorrow and start her treatment once we have good blood return. -We discussed the side effects of the chemotherapy regimen in detail.  We have also discussed side effects of growth factor.  She is already taking Claritin. -We will see her back in 2 weeks for follow-up.  2.  Family history: -We have sent her blood work today through [facility].   Orders placed this encounter:  No orders of the defined types were placed in this encounter.    Derek Jack, MD [place] [phone]   I, Milinda Antis, am acting as a scribe for Dr. Sanda Linger.  I, Derek Jack MD, have reviewed the above documentation for accuracy and completeness, and I agree with the above.

## 2020-07-31 NOTE — Patient Instructions (Signed)
[facility] at [facility]  Discharge Instructions:   _______________________________________________________________  Thank you for choosing [facility] at [facility] to provide your oncology and hematology care.  To afford each patient quality time with our providers, please arrive at least 15 minutes before your scheduled appointment.  You need to re-schedule your appointment if you arrive 10 or more minutes late.  We strive to give you quality time with our providers, and arriving late affects you and other patients whose appointments are after yours.  Also, if you no show three or more times for appointments you may be dismissed from the clinic.  Again, thank you for choosing [facility] at [facility]. Our hope is that these requests will allow you access to exceptional care and in a timely manner. _______________________________________________________________  If you have questions after your visit, please contact our office at [phone] between the hours of 8:30 a.m. and 5:00 p.m. Voicemails left after 4:30 p.m. will not be returned until the following business day. _______________________________________________________________  For prescription refill requests, have your pharmacy contact our office. _______________________________________________________________  Recommendations made by the consultant and any test results will be sent to your referring physician. _______________________________________________________________ 

## 2020-07-31 NOTE — Progress Notes (Signed)
Small amount of blood return from port, but not enough to perform lab draw for chemotherapy.  Labs drawn peripherally (genetic testing also drawn).

## 2020-07-31 NOTE — Patient Instructions (Addendum)
[place] at [facility] Discharge Instructions  You were seen today by Dr. Delton Coombes. He went over your recent results. You received your treatment today. You will also receive a booster shot for your white blood cell count. Start taking the prochlorperazine 4 times a day for your nausea. Drink at least 2-3 liters of fluid daily. Dr. Delton Coombes will see you back in 2 weeks for labs and follow up.   Thank you for choosing [place] at [facility] to provide your oncology and hematology care.  To afford each patient quality time with our provider, please arrive at least 15 minutes before your scheduled appointment time.   If you have a lab appointment with the [place] please come in thru the Main Entrance and check in at the main information desk  You need to re-schedule your appointment should you arrive 10 or more minutes late.  We strive to give you quality time with our providers, and arriving late affects you and other patients whose appointments are after yours.  Also, if you no show three or more times for appointments you may be dismissed from the clinic at the providers discretion.     Again, thank you for choosing [facility].  Our hope is that these requests will decrease the amount of time that you wait before being seen by our physicians.       _____________________________________________________________  Should you have questions after your visit to [facility], please contact our office at [phone] between the hours of 8:00 a.m. and 4:30 p.m.  Voicemails left after 4:00 p.m. will not be returned until the following business day.  For prescription refill requests, have your pharmacy contact our office and allow 72 hours.    Cancer Center Support Programs:   > Cancer Support Group  2nd Tuesday of the month 1pm-2pm, Journey Room

## 2020-07-31 NOTE — Progress Notes (Signed)
Patient presents today for treatment and follow up with Dr. Delton Coombes. Labs drawn peripheral due to port would not give blood. Verbal order from Dr. Delton Coombes to send patient down for dye study prior to treatment today. Patient has complaints of burning after port a cath flushed with normal saline.   Dye study performed. Message received from Dr. Delton Coombes to give Alteplase 2 mg x 1 dose now .   500 ml bolus of Normal Saline given today per MD orders. Tolerated infusion without adverse affects. Vital signs stable. No complaints at this time. Discharged from clinic ambulatory. F/U with [facility] as scheduled.

## 2020-08-01 ENCOUNTER — Inpatient Hospital Stay (HOSPITAL_COMMUNITY): Payer: BC Managed Care – PPO

## 2020-08-01 ENCOUNTER — Ambulatory Visit (HOSPITAL_COMMUNITY): Payer: BC Managed Care – PPO

## 2020-08-01 ENCOUNTER — Encounter (HOSPITAL_COMMUNITY): Payer: Self-pay | Admitting: General Practice

## 2020-08-01 ENCOUNTER — Encounter (HOSPITAL_COMMUNITY): Payer: Self-pay

## 2020-08-01 VITALS — BP 118/66 | HR 85 | Temp 97.1°F | Resp 18 | Wt 167.0 lb

## 2020-08-01 DIAGNOSIS — Z5111 Encounter for antineoplastic chemotherapy: Secondary | ICD-10-CM | POA: Diagnosis not present

## 2020-08-01 DIAGNOSIS — Z95828 Presence of other vascular implants and grafts: Secondary | ICD-10-CM

## 2020-08-01 DIAGNOSIS — C50919 Malignant neoplasm of unspecified site of unspecified female breast: Secondary | ICD-10-CM

## 2020-08-01 MED ORDER — SODIUM CHLORIDE 0.9% FLUSH
10.0000 mL | INTRAVENOUS | Status: DC | PRN
  Administered 2020-08-01: 10 mL

## 2020-08-01 MED ORDER — HEPARIN SOD (PORK) LOCK FLUSH 100 UNIT/ML IV SOLN
500.0000 [IU] | Freq: Once | INTRAVENOUS | Status: AC
  Administered 2020-08-01: 500 [IU] via INTRAVENOUS

## 2020-08-01 MED ORDER — SODIUM CHLORIDE 0.9 % IV SOLN: Freq: Once | INTRAVENOUS | Status: AC

## 2020-08-01 MED ORDER — SODIUM CHLORIDE 0.9 % IV SOLN
600.0000 mg/m2 | Freq: Once | INTRAVENOUS | Status: AC
  Administered 2020-08-01: 1040 mg via INTRAVENOUS
  Filled 2020-08-01: qty 52

## 2020-08-01 MED ORDER — DOXORUBICIN HCL CHEMO IV INJECTION 2 MG/ML
60.0000 mg/m2 | Freq: Once | INTRAVENOUS | Status: AC
  Administered 2020-08-01: 104 mg via INTRAVENOUS
  Filled 2020-08-01: qty 52

## 2020-08-01 MED ORDER — SODIUM CHLORIDE 0.9 % IV SOLN
10.0000 mg | Freq: Once | INTRAVENOUS | Status: AC
  Administered 2020-08-01: 10 mg via INTRAVENOUS
  Filled 2020-08-01: qty 10

## 2020-08-01 MED ORDER — SODIUM CHLORIDE 0.9 % IV SOLN
150.0000 mg | Freq: Once | INTRAVENOUS | Status: AC
  Administered 2020-08-01: 150 mg via INTRAVENOUS
  Filled 2020-08-01: qty 150

## 2020-08-01 MED ORDER — PALONOSETRON HCL INJECTION 0.25 MG/5ML
0.2500 mg | Freq: Once | INTRAVENOUS | Status: AC
  Administered 2020-08-01: 0.25 mg via INTRAVENOUS
  Filled 2020-08-01: qty 5

## 2020-08-01 NOTE — Progress Notes (Signed)
[facility] CSW Progress Notes  CSW submitted application for financial assistance to [facility] on patients behalf.  Met w patient, gave patient given instructions on documentation needed to complete applications for [employer] in [place].  When she is able to bring these documents in, CSW will file these applications also.  Edwyna Shell, LCSW Clinical Social Worker Phone:  [phone] Cell:  [phone]

## 2020-08-01 NOTE — Patient Instructions (Signed)
[facility] at [facility]  Discharge Instructions:   _______________________________________________________________  Thank you for choosing [facility] at [facility] to provide your oncology and hematology care.  To afford each patient quality time with our providers, please arrive at least 15 minutes before your scheduled appointment.  You need to re-schedule your appointment if you arrive 10 or more minutes late.  We strive to give you quality time with our providers, and arriving late affects you and other patients whose appointments are after yours.  Also, if you no show three or more times for appointments you may be dismissed from the clinic.  Again, thank you for choosing [facility] at [facility]. Our hope is that these requests will allow you access to exceptional care and in a timely manner. _______________________________________________________________  If you have questions after your visit, please contact our office at [phone] between the hours of 8:30 a.m. and 5:00 p.m. Voicemails left after 4:30 p.m. will not be returned until the following business day. _______________________________________________________________  For prescription refill requests, have your pharmacy contact our office. _______________________________________________________________  Recommendations made by the consultant and any test results will be sent to your referring physician. _______________________________________________________________ 

## 2020-08-01 NOTE — Addendum Note (Signed)
Addended by: Derek Jack on: 08/01/2020 08:44 AM   Modules accepted: Orders

## 2020-08-01 NOTE — Progress Notes (Signed)
Patient presents today for C1 D1 AC. Vital signs within parameters for treatment. 90mls of blood return noted from port a cath. Patient has no complaints of pain while flushing port. No resistance noted during flush with 35mls of Normal Saline.   Message received from Dr. Delton Coombes. Proceed with treatment. Infuse 540ml bolus of Normal Saline during or after Cytoxan today.   Treatment given today per MD orders. Tolerated infusion without adverse affects. Vital signs stable. No complaints at this time. Discharged from clinic ambulatory. F/U with [facility] as scheduled.

## 2020-08-02 ENCOUNTER — Telehealth: Payer: Self-pay | Admitting: General Practice

## 2020-08-02 NOTE — Telephone Encounter (Signed)
[facility] CSW Progress Notes  Email from [place] has made a payment towards past due power bill.    Edwyna Shell, LCSW Clinical Social Worker Phone:  [phone]

## 2020-08-03 ENCOUNTER — Inpatient Hospital Stay (HOSPITAL_COMMUNITY): Payer: BC Managed Care – PPO

## 2020-08-03 ENCOUNTER — Other Ambulatory Visit: Payer: Self-pay

## 2020-08-03 VITALS — BP 128/77 | HR 71 | Temp 98.4°F | Resp 16

## 2020-08-03 DIAGNOSIS — Z95828 Presence of other vascular implants and grafts: Secondary | ICD-10-CM

## 2020-08-03 DIAGNOSIS — C50919 Malignant neoplasm of unspecified site of unspecified female breast: Secondary | ICD-10-CM

## 2020-08-03 DIAGNOSIS — Z5111 Encounter for antineoplastic chemotherapy: Secondary | ICD-10-CM | POA: Diagnosis not present

## 2020-08-03 MED ORDER — PEGFILGRASTIM-JMDB 6 MG/0.6ML ~~LOC~~ SOSY
6.0000 mg | PREFILLED_SYRINGE | Freq: Once | SUBCUTANEOUS | Status: AC
  Administered 2020-08-03: 6 mg via SUBCUTANEOUS

## 2020-08-03 NOTE — Progress Notes (Signed)
Fulphila given per MD orders in abdomen, right lower quadrant.  Patient was discharged ambulatory and in stable condition from the clinic. She is aware of her follow up appointments.

## 2020-08-03 NOTE — Patient Instructions (Signed)
You were here today for your Fulphila injection. This is given to help boost your white blood cell counts.  Please call us should you have any questions or concerns between now and your next visit.

## 2020-08-03 NOTE — Progress Notes (Signed)
Patient is here today for her fulphila injection per MD orders.  She states that since receiving chemotherapy on 8/3, she has not had many side effects.  She states that she has been dizzy a few times upon standing. She is not dizzy at this time. She denies any nausea/vomiting/diarrhea. She states that her taste for food has started to change.  Her VS are stable at this time, she denies any pain.

## 2020-08-14 ENCOUNTER — Inpatient Hospital Stay (HOSPITAL_COMMUNITY): Payer: BC Managed Care – PPO

## 2020-08-14 ENCOUNTER — Other Ambulatory Visit: Payer: Self-pay

## 2020-08-14 ENCOUNTER — Inpatient Hospital Stay (HOSPITAL_BASED_OUTPATIENT_CLINIC_OR_DEPARTMENT_OTHER): Payer: BC Managed Care – PPO | Admitting: Hematology

## 2020-08-14 VITALS — BP 130/78 | HR 81 | Temp 97.9°F | Resp 18

## 2020-08-14 VITALS — BP 138/81 | HR 95 | Temp 97.1°F | Resp 18 | Wt 164.9 lb

## 2020-08-14 DIAGNOSIS — Z95828 Presence of other vascular implants and grafts: Secondary | ICD-10-CM

## 2020-08-14 DIAGNOSIS — C50919 Malignant neoplasm of unspecified site of unspecified female breast: Secondary | ICD-10-CM | POA: Diagnosis not present

## 2020-08-14 DIAGNOSIS — Z5111 Encounter for antineoplastic chemotherapy: Secondary | ICD-10-CM | POA: Diagnosis not present

## 2020-08-14 LAB — COMPREHENSIVE METABOLIC PANEL
ALT: 19 U/L (ref 0–44)
AST: 13 U/L — ABNORMAL LOW (ref 15–41)
Albumin: 3.7 g/dL (ref 3.5–5.0)
Alkaline Phosphatase: 97 U/L (ref 38–126)
Anion gap: 9 (ref 5–15)
BUN: 16 mg/dL (ref 6–20)
CO2: 24 mmol/L (ref 22–32)
Calcium: 8.8 mg/dL — ABNORMAL LOW (ref 8.9–10.3)
Chloride: 101 mmol/L (ref 98–111)
Creatinine, Ser: 0.79 mg/dL (ref 0.44–1.00)
GFR calc Af Amer: >60 mL/min (ref >60)
GFR calc non Af Amer: >60 mL/min (ref >60)
Glucose, Bld: 233 mg/dL — ABNORMAL HIGH (ref 70–99)
Potassium: 3.4 mmol/L — ABNORMAL LOW (ref 3.5–5.1)
Sodium: 134 mmol/L — ABNORMAL LOW (ref 135–145)
Total Bilirubin: 0.3 mg/dL (ref 0.3–1.2)
Total Protein: 6.9 g/dL (ref 6.5–8.1)

## 2020-08-14 LAB — CBC WITH DIFFERENTIAL/PLATELET
Abs Immature Granulocytes: 1.29 10*3/uL — ABNORMAL HIGH (ref 0.00–0.07)
Basophils Absolute: 0.1 10*3/uL (ref 0.0–0.1)
Basophils Relative: 0 %
Eosinophils Absolute: 0.2 10*3/uL (ref 0.0–0.5)
Eosinophils Relative: 2 %
HCT: 37.7 % (ref 36.0–46.0)
Hemoglobin: 12.4 g/dL (ref 12.0–15.0)
Immature Granulocytes: 11 %
Lymphocytes Relative: 14 %
Lymphs Abs: 1.6 10*3/uL (ref 0.7–4.0)
MCH: 27.7 pg (ref 26.0–34.0)
MCHC: 32.9 g/dL (ref 30.0–36.0)
MCV: 84.3 fL (ref 80.0–100.0)
Monocytes Absolute: 1 10*3/uL (ref 0.1–1.0)
Monocytes Relative: 9 %
Neutro Abs: 7.2 10*3/uL (ref 1.7–7.7)
Neutrophils Relative %: 64 %
Platelets: 216 10*3/uL (ref 150–400)
RBC: 4.47 MIL/uL (ref 3.87–5.11)
RDW: 13.7 % (ref 11.5–15.5)
WBC: 11.3 10*3/uL — ABNORMAL HIGH (ref 4.0–10.5)
nRBC: 0 % (ref 0.0–0.2)

## 2020-08-14 MED ORDER — SODIUM CHLORIDE 0.9 % IV SOLN
10.0000 mg | Freq: Once | INTRAVENOUS | Status: AC
  Administered 2020-08-14: 10 mg via INTRAVENOUS
  Filled 2020-08-14: qty 10

## 2020-08-14 MED ORDER — PALONOSETRON HCL INJECTION 0.25 MG/5ML
0.2500 mg | Freq: Once | INTRAVENOUS | Status: AC
  Administered 2020-08-14: 0.25 mg via INTRAVENOUS
  Filled 2020-08-14: qty 5

## 2020-08-14 MED ORDER — SODIUM CHLORIDE 0.9 % IV SOLN
600.0000 mg/m2 | Freq: Once | INTRAVENOUS | Status: AC
  Administered 2020-08-14: 1040 mg via INTRAVENOUS
  Filled 2020-08-14: qty 52

## 2020-08-14 MED ORDER — DOXORUBICIN HCL CHEMO IV INJECTION 2 MG/ML
60.0000 mg/m2 | Freq: Once | INTRAVENOUS | Status: AC
  Administered 2020-08-14: 104 mg via INTRAVENOUS
  Filled 2020-08-14: qty 52

## 2020-08-14 MED ORDER — HEPARIN SOD (PORK) LOCK FLUSH 100 UNIT/ML IV SOLN
500.0000 [IU] | Freq: Once | INTRAVENOUS | Status: AC | PRN
  Administered 2020-08-14: 500 [IU]

## 2020-08-14 MED ORDER — SODIUM CHLORIDE 0.9% FLUSH
10.0000 mL | INTRAVENOUS | Status: DC | PRN
  Administered 2020-08-14: 10 mL

## 2020-08-14 MED ORDER — POTASSIUM CHLORIDE ER 10 MEQ PO TBCR: 10.0000 meq | EXTENDED_RELEASE_TABLET | Freq: Every day | ORAL | 2 refills | Status: DC

## 2020-08-14 MED ORDER — SODIUM CHLORIDE 0.9 % IV SOLN
150.0000 mg | Freq: Once | INTRAVENOUS | Status: AC
  Administered 2020-08-14: 150 mg via INTRAVENOUS
  Filled 2020-08-14: qty 150

## 2020-08-14 MED ORDER — SODIUM CHLORIDE 0.9 % IV SOLN: Freq: Once | INTRAVENOUS | Status: AC

## 2020-08-14 NOTE — Progress Notes (Signed)
Labs reviewed with Dr Raliegh Ip.  K 3.4, starting on oral K at home.  Okay to proceed with treatment today.  Elizabeth Graves tolerated treatment well today without incidence.  Vital signs WNL prior to discharge. Discharged ambulatory.

## 2020-08-14 NOTE — Patient Instructions (Signed)
[facility] at [facility] Discharge Instructions  You were seen today by Dr. Katragadda. He went over your recent results. You received your treatment today. You will be prescribed potassium to take daily. Dr. Katragadda will see you back in 2 weeks for labs and follow up.   Thank you for choosing [facility] at [facility] to provide your oncology and hematology care.  To afford each patient quality time with our provider, please arrive at least 15 minutes before your scheduled appointment time.   If you have a lab appointment with the Cancer Center please come in thru the Main Entrance and check in at the main information desk  You need to re-schedule your appointment should you arrive 10 or more minutes late.  We strive to give you quality time with our providers, and arriving late affects you and other patients whose appointments are after yours.  Also, if you no show three or more times for appointments you may be dismissed from the clinic at the providers discretion.     Again, thank you for choosing [facility].  Our hope is that these requests will decrease the amount of time that you wait before being seen by our physicians.       _____________________________________________________________  Should you have questions after your visit to [facility], please contact our office at [phone] between the hours of 8:00 a.m. and 4:30 p.m.  Voicemails left after 4:00 p.m. will not be returned until the following business day.  For prescription refill requests, have your pharmacy contact our office and allow 72 hours.    Cancer Center Support Programs:   > Cancer Support Group  2nd Tuesday of the month 1pm-2pm, Journey Room    

## 2020-08-14 NOTE — Progress Notes (Signed)
Patient has been assessed by Dr. Delton Coombes and labs reviewed. Her potassium is 3.4 and he is starting her on oral potassium 10 mEq daily.  He is okay for her to proceed with treatment today.    She is going to change her treatment days to Fridays. She will see Dr. Raliegh Ip on Thursdays with labs and then come back on Fridays for treatment then fulphila on Mondays.

## 2020-08-14 NOTE — Patient Instructions (Signed)
[facility] Discharge Instructions for Patients Receiving Chemotherapy  Today you received the following chemotherapy agents   To help prevent nausea and vomiting after your treatment, we encourage you to take your nausea medication   If you develop nausea and vomiting that is not controlled by your nausea medication, call the clinic.   BELOW ARE SYMPTOMS THAT SHOULD BE REPORTED IMMEDIATELY:  *FEVER GREATER THAN 100.5 F  *CHILLS WITH OR WITHOUT FEVER  NAUSEA AND VOMITING THAT IS NOT CONTROLLED WITH YOUR NAUSEA MEDICATION  *UNUSUAL SHORTNESS OF BREATH  *UNUSUAL BRUISING OR BLEEDING  TENDERNESS IN MOUTH AND THROAT WITH OR WITHOUT PRESENCE OF ULCERS  *URINARY PROBLEMS  *BOWEL PROBLEMS  UNUSUAL RASH Items with * indicate a potential emergency and should be followed up as soon as possible.  Feel free to call the clinic should you have any questions or concerns. The clinic phone number is [phone].  Please show the CHEMO ALERT CARD at check-in to the Emergency Department and triage nurse.   

## 2020-08-14 NOTE — Progress Notes (Signed)
Elizabeth Graves, [place] [address]   CLINIC:  Medical Oncology/Hematology  PCP:  Elizabeth Drown, MD [place] / [place] [address] [phone]   REASON FOR VISIT:  Follow-up for left breast cancer  PRIOR THERAPY: None  NGS Results: Not done  CURRENT THERAPY: Dose-dense Adriamycin and cyclophosphamide  BRIEF ONCOLOGIC HISTORY:  Oncology History  Triple negative malignant neoplasm of breast ([facility])  07/05/2020 Initial Diagnosis   Triple negative malignant neoplasm of breast ([facility])   08/01/2020 -  Chemotherapy   The patient had DOXOrubicin (ADRIAMYCIN) chemo injection 104 mg, 60 mg/m2 = 104 mg, Intravenous,  Once, 1 of 4 cycles Administration: 104 mg (08/01/2020) palonosetron (ALOXI) injection 0.25 mg, 0.25 mg, Intravenous,  Once, 1 of 8 cycles Administration: 0.25 mg (08/01/2020) pegfilgrastim-jmdb (FULPHILA) injection 6 mg, 6 mg, Subcutaneous,  Once, 1 of 4 cycles Administration: 6 mg (08/03/2020) CARBOplatin (PARAPLATIN) in sodium chloride 0.9 % 100 mL chemo infusion, , Intravenous,  Once, 0 of 4 cycles cyclophosphamide (CYTOXAN) 1,040 mg in sodium chloride 0.9 % 250 mL chemo infusion, 600 mg/m2 = 1,040 mg, Intravenous,  Once, 1 of 4 cycles Administration: 1,040 mg (08/01/2020) PACLitaxel (TAXOL) 138 mg in sodium chloride 0.9 % 250 mL chemo infusion (</= 26m/m2), 80 mg/m2, Intravenous,  Once, 0 of 4 cycles fosaprepitant (EMEND) 150 mg in sodium chloride 0.9 % 145 mL IVPB, 150 mg, Intravenous,  Once, 1 of 8 cycles Administration: 150 mg (08/01/2020)  for chemotherapy treatment.      CANCER STAGING: Cancer Staging No matching staging information was found for the patient.  INTERVAL HISTORY:  Elizabeth Graves a 54y.o. female, returns for routine follow-up and consideration for next cycle of chemotherapy. Elizabeth Graves was last seen on 07/31/2020.  Due for cycle #2 of cyclophosphamide, doxorubicin and Aloxi today.   Overall, she tells  me she has been feeling pretty well. She is tolerating the treatments well, with nausea for the first week after treatment which is controlled with Compazine. She also reports experiencing aches and pains after the Fulphila for which she takes Claritin. She is drinking about 3 liters of fluid daily and denies hematuria.  Overall, she feels ready for next cycle of chemo today.    REVIEW OF SYSTEMS:  Review of Systems  Constitutional: Negative for appetite change.  Gastrointestinal: Positive for nausea (first week after Tx).  Genitourinary: Negative for hematuria.   Musculoskeletal: Positive for arthralgias (after Fulphila) and myalgias (after Fulphila).  Psychiatric/Behavioral: Positive for sleep disturbance.  All other systems reviewed and are negative.   PAST MEDICAL/SURGICAL HISTORY:  Past Medical History:  Diagnosis Date  . Asthma   . Cancer ([facility]    cervical - 20 yrs ago  . Carpal tunnel syndrome   . Depression   . Family history of breast cancer   . Family history of esophageal cancer   . Family history of lung cancer   . Family history of throat cancer   . Hypertension   . Hypokalemia    tx w/ k-Dur  . Port-A-Cath in place 07/24/2020  . Seasonal allergies    tx with benadryl prn  . SVD (spontaneous vaginal delivery)    x 2   Past Surgical History:  Procedure Laterality Date  . ABDOMINAL HYSTERECTOMY    . CO2 Laser of Cervix     for cervical cancer 20 yrs ago  . DILATION AND CURETTAGE OF UTERUS  12/2010   polyp removed  .  DILATION AND CURETTAGE OF UTERUS     x 3 for MAB  . LAPAROSCOPIC ASSISTED VAGINAL HYSTERECTOMY  06/09/2012   Procedure: LAPAROSCOPIC ASSISTED VAGINAL HYSTERECTOMY;  Surgeon: Elizabeth Height, MD;  Location: [facility];  Service: Gynecology;  Laterality: N/A;  . PORTACATH PLACEMENT Right 07/17/2020   Procedure: INSERTION PORT-A-CATH;  Surgeon: Elizabeth Cagey, MD;  Location: [facility];  Service: General;  Laterality: Right;  . SALPINGOOPHORECTOMY  06/09/2012    Procedure: SALPINGO OOPHERECTOMY;  Surgeon: Elizabeth Height, MD;  Location: [facility];  Service: Gynecology;  Laterality: Bilateral;  . TUBAL LIGATION      SOCIAL HISTORY:  Social History   Socioeconomic History  . Marital status: Married    Spouse name: Not on file  . Number of children: 2  . Years of education: Not on file  . Highest education level: Not on file  Occupational History  . Occupation: EMPLOYED  Tobacco Use  . Smoking status: Former Smoker    Packs/day: 1.00    Years: 16.00    Pack years: 16.00    Types: Cigarettes    Start date: 05/30/1994    Quit date: 01/31/2020    Years since quitting: 0.5  . Smokeless tobacco: Never Used  Vaping Use  . Vaping Use: Never used  Substance and Sexual Activity  . Alcohol use: No  . Drug use: No  . Sexual activity: Yes    Birth control/protection: Surgical  Other Topics Concern  . Not on file  Social History Narrative  . Not on file   Social Determinants of Health   Financial Resource Strain: Low Risk   . Difficulty of Paying Living Expenses: Not very hard  Food Insecurity: No Food Insecurity  . Worried About Charity fundraiser in the Last Year: Never true  . Ran Out of Food in the Last Year: Never true  Transportation Needs: No Transportation Needs  . Lack of Transportation (Medical): No  . Lack of Transportation (Non-Medical): No  Physical Activity: Inactive  . Days of Exercise per Week: 0 days  . Minutes of Exercise per Session: 0 min  Stress: Stress Concern Present  . Feeling of Stress : To some extent  Social Connections: Moderately Isolated  . Frequency of Communication with Friends and Family: Three times a week  . Frequency of Social Gatherings with Friends and Family: Once a week  . Attends Religious Services: Never  . Active Member of Clubs or Organizations: No  . Attends Archivist Meetings: Never  . Marital Status: Married  Human resources officer Violence: Not At Risk  . Fear of Current or Ex-Partner: No   . Emotionally Abused: No  . Physically Abused: No  . Sexually Abused: No    FAMILY HISTORY:  Family History  Problem Relation Age of Onset  . Heart disease Mother   . Lung cancer Mother 35       smoker  . Heart disease Father   . Esophageal cancer Father        GE junction, dx. in his early 81s  . Diverticulitis Sister   . Diabetes Sister   . Diabetes Maternal Grandmother   . Heart disease Maternal Grandmother   . Throat cancer Maternal Grandmother        dx. late 20s  . Healthy Sister   . Healthy Daughter   . Healthy Son   . Breast cancer Paternal Aunt        limited info    CURRENT MEDICATIONS:  Current  Outpatient Medications  Medication Sig Dispense Refill  . albuterol (VENTOLIN HFA) 108 (90 Base) MCG/ACT inhaler TAKE 2 PUFFS BY MOUTH EVERY 6 HOURS AS NEEDED FOR WHEEZE OR SHORTNESS OF BREATH 8.5 Inhaler 0  . atorvastatin (LIPITOR) 20 MG tablet TAKE 1 TABLET BY MOUTH EVERY DAY 90 tablet 1  . CYCLOPHOSPHAMIDE IV Inject into the vein every 14 (fourteen) days.    . diphenhydrAMINE (BENADRYL) 25 mg capsule Take 25 mg by mouth every 6 (six) hours as needed for allergies. Allergies.    Marland Kitchen DOXOrubicin HCl (ADRIAMYCIN IV) Inject into the vein every 14 (fourteen) days.    Marland Kitchen esomeprazole (NEXIUM) 20 MG capsule Take 20 mg by mouth daily before breakfast.    . ibuprofen (ADVIL,MOTRIN) 200 MG tablet Take 400-600 mg by mouth every 8 (eight) hours as needed for moderate pain (pain.).     Marland Kitchen lidocaine-prilocaine (EMLA) cream Apply a pea sized amount to port a cath site and cover with plastic wrap 1 hour prior to chemotherapy appointments 30 g 3  . lisinopril (ZESTRIL) 5 MG tablet TAKE 1 TABLET BY MOUTH EVERY DAY 90 tablet 1  . naproxen sodium (ALEVE) 220 MG tablet Take 440 mg by mouth 3 (three) times daily as needed (pain.).     Marland Kitchen pegfilgrastim-jmdb (FULPHILA) 6 MG/0.6ML injection Inject 6 mg into the skin every 14 (fourteen) days.    . prochlorperazine (COMPAZINE) 10 MG tablet Take 1  tablet (10 mg total) by mouth every 6 (six) hours as needed (Nausea or vomiting). 30 tablet 1  . sertraline (ZOLOFT) 50 MG tablet TAKE 1 TABLET BY MOUTH EVERY DAY 90 tablet 0   No current facility-administered medications for this visit.    ALLERGIES:  No Known Allergies  PHYSICAL EXAM:  Performance status (ECOG): 0 - Asymptomatic  Vitals:   08/14/20 0851  BP: 138/81  Pulse: 95  Resp: 18  Temp: (!) 97.1 F (36.2 C)  SpO2: 96%   Wt Readings from Last 3 Encounters:  08/14/20 164 lb 14.4 oz (74.8 kg)  08/01/20 167 lb (75.8 kg)  07/31/20 165 lb 6.4 oz (75 kg)   Physical Exam Vitals reviewed.  Constitutional:      Appearance: Normal appearance. She is obese.  Cardiovascular:     Rate and Rhythm: Normal rate and regular rhythm.     Pulses: Normal pulses.     Heart sounds: Normal heart sounds.  Pulmonary:     Effort: Pulmonary effort is normal.     Breath sounds: Normal breath sounds.  Chest:     Comments: Port-a-Cath on R chest Abdominal:     Palpations: Abdomen is soft. There is no mass.     Tenderness: There is no abdominal tenderness.  Neurological:     General: No focal deficit present.     Mental Status: She is alert and oriented to person, place, and time.  Psychiatric:        Mood and Affect: Mood normal.        Behavior: Behavior normal.     LABORATORY DATA:  I have reviewed the labs as listed.  CBC Latest Ref Rng & Units 08/14/2020 07/31/2020 07/05/2020  WBC 4.0 - 10.5 K/uL 11.3(H) 8.5 9.4  Hemoglobin 12.0 - 15.0 g/dL 12.4 13.1 13.4  Hematocrit 36 - 46 % 37.7 39.1 40.5  Platelets 150 - 400 K/uL 216 334 365   CMP Latest Ref Rng & Units 08/14/2020 07/31/2020 07/05/2020  Glucose 70 - 99 mg/dL 233(H) 181(H) 114(H)  BUN 6 -  20 mg/dL _0 Creatinine 0.44 - 1.00 mg/dL 0.79 0.97 0.95  Sodium 135 - 145 mmol/L 134(L) 137 137  Potassium 3.5 - 5.1 mmol/L 3.4(L) 3.2(L) 3.8  Chloride 98 - 111 mmol/L 101 104 103  CO2 22 - 32 mmol/L _1 Calcium 8.9 - 10.3 mg/dL  8.8(L) 9.2 9.2  Total Protein 6.5 - 8.1 g/dL 6.9 7.2 7.6  Total Bilirubin 0.3 - 1.2 mg/dL 0.3 0.5 0.4  Alkaline Phos 38 - 126 U/L 97 85 86  AST 15 - 41 U/L 13(L) 16 17  ALT 0 - 44 U/L _2 DIAGNOSTIC IMAGING:  I have independently reviewed the scans and discussed with the patient. MR BREAST BILATERAL W WO CONTRAST INC CAD  Result Date: 07/19/2020 CLINICAL DATA:  54 year old female with newly diagnosed left breast invasive ductal carcinoma and left axillary metastases. LABS:  None performed on site. EXAM: BILATERAL BREAST MRI WITH AND WITHOUT CONTRAST TECHNIQUE: Multiplanar, multisequence MR images of both breasts were obtained prior to and following the intravenous administration of 8 ml of Gadavist. Three-dimensional MR images were rendered by post-processing of the original MR data on an independent workstation. The three-dimensional MR images were interpreted, and findings are reported in the following complete MRI report for this study. Three dimensional images were evaluated at the independent DynaCad workstation. COMPARISON:  Previous exam(s). FINDINGS: Breast composition: c. Heterogeneous fibroglandular tissue. Background parenchymal enhancement: Moderate. Right breast: No suspicious mass or abnormal enhancement. Left breast: Susceptibility artifact from post biopsy clip is seen in association with an irregular, enhancing mass in the subareolar left breast (series 6, image 56/112). This is consistent with the patient's biopsy-proven malignancy. The mass measures 4.2 x 3.5 x 3.5 cm. Enhancement from the mass extends to the level of the overlying left nipple which is retracted. There is mild skin thickening along the periareolar aspect. Otherwise, no suspicious mass or abnormal enhancement in the remainder of the left breast. Lymph nodes: There are 6 morphologically abnormal level I left axillary lymph nodes, one of which demonstrates susceptibility artifact from post biopsy clip. No enlarged  level II or III left axillary lymph nodes noted within the limits of this study. No suspicious internal mammary chain or right axillary lymph nodes. Ancillary findings:  None. IMPRESSION: 1. 4.2 cm enhancing mass in the subareolar left breast consistent with the patient's biopsy-proven site of malignancy. Enhancement extends to the level of the left nipple with associated nipple retraction and periareolar skin thickening, suggesting involvement. 2. Six morphologically abnormal level I left axillary lymph nodes. 3. No MRI evidence of malignancy on the right. RECOMMENDATION: Per clinical treatment plan. BI-RADS CATEGORY  6: Known biopsy-proven malignancy. Electronically Signed   By: Kristopher Oppenheim M.D.   On: 07/19/2020 09:43   DG Chest Port 1 View  Result Date: 07/17/2020 CLINICAL DATA:  Port insertion EXAM: PORTABLE CHEST 1 VIEW COMPARISON:  03/22/2015, 07/14/2020 FINDINGS: Interval placement of right subclavian approach Port-A-Cath with distal tip terminating at the level of the distal SVC. Stable heart size. Atherosclerotic calcification of the aortic knob. No new focal airspace consolidation. No pleural effusion or pneumothorax. IMPRESSION: Interval placement of right subclavian approach Port-A-Cath. No pneumothorax. Electronically Signed   By: Davina Poke D.O.   On: 07/17/2020 10:32   DG C-Arm 1-60 Min-No Report  Result Date: 07/17/2020 Fluoroscopy was utilized by the requesting physician.  No radiographic interpretation.   DG CV Line Injection  Result Date: 07/31/2020 INDICATION: Limited blood  return through Port-A-Cath EXAM: CONTRAST INJECTION OF PORT A CATH UNDER FLUOROSCOPY CONTRAST:  30m OMNIPAQUE IOHEXOL 300 MG/ML  SOLN IV TECHNIQUE: Contrast was administered via the indwelling port after it was accessed. Fluoroscopic spot images were obtained of the catheter during injection ANESTHESIA/SEDATION: None FLUOROSCOPY TIME:  Radiation Exposure Index (as provided by the fluoroscopic device): 2.9  mGy If the device does not provide the exposure index: Fluoroscopy Time:  2 minutes and 24 seconds Number of Acquired Images: 2 images and 2 cine series during injection COMPLICATIONS: None immediate. TECHNIQUE: Indwelling Port-A-Cath was injected with 10 cc of contrast. Imaging was performed. FINDINGS: Port-A-Cath reservoir and tubing are connected. Tubing intact without extravasation or kink. No contrast jet exits the tip of the catheter. Prominent fibrin sheath is seen along the distal 5 cm of the Port-A-Cath tubing with spillage of contrast into the lumen at the confluence of the SVC. IMPRESSION: Tip of the Port-A-Cath is occluded, with injected contrast material tracking 5 cm proximally along the Port-A-Cath tubing deep to a prominent fibrin sheath and exiting into the venous lumen at the level of the confluence of the SVC. Electronically Signed   By: MLavonia DanaM.D.   On: 07/31/2020 13:39     ASSESSMENT:  1. Triple negative left breast cancer (T2N1): -Patient identified left breast mass with left nipple retraction about 2 months ago. -Mammogram on 06/13/2020 showed mass in the retroareolar 12 o'clock position in the left breast. 2 suspicious left axillary lymph nodes, largest measuring 2 cm. -Breast ultrasound showed 2.7 x 1.6 x 2.2 cm mass in the 12:00 retroareolar position. Just lateral to the mass is a calcified hypoechoic nodule that corresponds to densely calcified degenerating fibroadenoma. -Left breast 12:00 biopsy showed invasive ductal carcinoma, grade 2/3. Left axillary lymph node biopsy was consistent with meta stasis. -ER negative, PR negative and Ki-67 15%. HER-2 equivocal by IHC. HER-2 FISH negative. -MRI of the breast on 07/19/2020 shows 4.2 cm enhancing mass in the subareolar left breast, enhancement extending to the level of the left nipple with associated nipple retraction and periareolar skin thickening. 6 morphologically abnormal level 1 left axillary lymph nodes. No  evidence of malignancy in the right breast. -CT CAP on 07/14/2020 shows dominant left axillary nodes measuring 1.8 cm short axis. Small left subpectoral nodes measuring up to 7 mm short axis, suspicious. Small mediastinal lymph nodes measuring 7 to 9 mm, thought to be reactive. No other evidence of metastatic disease. -Neoadjuvant Dose dense AC started on 08/01/2020.  2. Family history: -Father had gastroesophageal junction cancer. Maternal grandmother had throat cancer and paternal aunt had breast cancer.  3. High risk drug monitoring: -Echo on 07/12/2020 shows EF 60 to 65%.   PLAN:  1. T2N1 left breast TNBC: -She has tolerated her first cycle except for some nausea issues. -I reviewed her CBC which was adequate to proceed with cycle 2 today.  LFTs were normal. -She will come back in 2 weeks for follow-up with labs and treatment.  2. [facility] history: -[facility]  3.  Hypokalemia: -She has hypokalemia for the last 2 occasions.  Potassium today is 3.4. -We will start on potassium 10 mEq daily.     Orders placed this encounter:  No orders of the defined types were placed in this encounter.    SDerek Jack MD [phone]  I, DMilinda Antis am acting as a scribe for Dr. SSanda Linger  IKinnie ScalesMD, have reviewed  the above documentation for accuracy and completeness, and I agree with the above.

## 2020-08-15 ENCOUNTER — Ambulatory Visit: Payer: BC Managed Care – PPO

## 2020-08-16 ENCOUNTER — Encounter (HOSPITAL_COMMUNITY): Payer: Self-pay

## 2020-08-16 ENCOUNTER — Other Ambulatory Visit: Payer: Self-pay

## 2020-08-16 ENCOUNTER — Inpatient Hospital Stay (HOSPITAL_COMMUNITY): Payer: BC Managed Care – PPO

## 2020-08-16 DIAGNOSIS — C50919 Malignant neoplasm of unspecified site of unspecified female breast: Secondary | ICD-10-CM

## 2020-08-16 DIAGNOSIS — Z5111 Encounter for antineoplastic chemotherapy: Secondary | ICD-10-CM | POA: Diagnosis not present

## 2020-08-16 DIAGNOSIS — Z95828 Presence of other vascular implants and grafts: Secondary | ICD-10-CM

## 2020-08-16 MED ORDER — PEGFILGRASTIM-JMDB 6 MG/0.6ML ~~LOC~~ SOSY
6.0000 mg | PREFILLED_SYRINGE | Freq: Once | SUBCUTANEOUS | Status: AC
  Administered 2020-08-16: 6 mg via SUBCUTANEOUS

## 2020-08-18 ENCOUNTER — Encounter (HOSPITAL_COMMUNITY): Payer: Self-pay | Admitting: *Deleted

## 2020-08-18 NOTE — Progress Notes (Signed)
[employer] medial request forms completed for patient's short term disability.  Faxed to [phone]

## 2020-08-18 NOTE — Progress Notes (Signed)
FMLA papers completed for continuous leave.  Faxed to [employer] [phone]

## 2020-08-18 NOTE — Progress Notes (Signed)
Americans with Disabilities request form faxed to [employer]

## 2020-08-21 ENCOUNTER — Encounter: Payer: Self-pay | Admitting: Genetic Counselor

## 2020-08-21 ENCOUNTER — Telehealth: Payer: Self-pay | Admitting: Genetic Counselor

## 2020-08-21 ENCOUNTER — Ambulatory Visit: Payer: Self-pay | Admitting: Genetic Counselor

## 2020-08-21 DIAGNOSIS — Z1379 Encounter for other screening for genetic and chromosomal anomalies: Secondary | ICD-10-CM | POA: Insufficient documentation

## 2020-08-21 NOTE — Telephone Encounter (Signed)
Revealed negative genetic testing. Discussed that we do not know why she has breast or why there is cancer in the family. It is possible that there could be a mutation in a different gene that we are not testing, or our current technology may not be able detect certain mutations. It will therefore be important for her to stay in contact with genetics to keep up with whether additional testing may be appropriate in the future.   A variant of uncertain significance was detected in the MSH2 gene called c.1600C>T (p.R534C). Her result is still considered normal at this time and should not impact her medical management.

## 2020-08-21 NOTE — Progress Notes (Signed)
HPI:  Ms. Furukawa was previously seen in the [facility] due to a personal and family history of cancer and concerns regarding a hereditary predisposition to cancer. Please refer to our prior cancer genetics clinic note for more information regarding our discussion, assessment and recommendations, at the time. Ms. Hinger recent genetic test results were disclosed to her, as were recommendations warranted by these results. These results and recommendations are discussed in more detail below.  CANCER HISTORY:  Oncology History  Triple negative malignant neoplasm of breast ([place])  07/05/2020 Initial Diagnosis   Triple negative malignant neoplasm of breast ([place])   08/01/2020 -  Chemotherapy   The patient had DOXOrubicin (ADRIAMYCIN) chemo injection 104 mg, 60 mg/m2 = 104 mg, Intravenous,  Once, 2 of 4 cycles Administration: 104 mg (08/01/2020), 104 mg (08/14/2020) palonosetron (ALOXI) injection 0.25 mg, 0.25 mg, Intravenous,  Once, 2 of 8 cycles Administration: 0.25 mg (08/01/2020), 0.25 mg (08/14/2020) pegfilgrastim-jmdb (FULPHILA) injection 6 mg, 6 mg, Subcutaneous,  Once, 2 of 4 cycles Administration: 6 mg (08/03/2020) CARBOplatin (PARAPLATIN) in sodium chloride 0.9 % 100 mL chemo infusion, , Intravenous,  Once, 0 of 4 cycles cyclophosphamide (CYTOXAN) 1,040 mg in sodium chloride 0.9 % 250 mL chemo infusion, 600 mg/m2 = 1,040 mg, Intravenous,  Once, 2 of 4 cycles Administration: 1,040 mg (08/01/2020), 1,040 mg (08/14/2020) PACLitaxel (TAXOL) 138 mg in sodium chloride 0.9 % 250 mL chemo infusion (</= $RemoveBefor'80mg'ttDFyxnalivk$ /m2), 80 mg/m2, Intravenous,  Once, 0 of 4 cycles fosaprepitant (EMEND) 150 mg in sodium chloride 0.9 % 145 mL IVPB, 150 mg, Intravenous,  Once, 2 of 8 cycles Administration: 150 mg (08/01/2020), 150 mg (08/14/2020)  for chemotherapy treatment.    08/17/2020 Genetic Testing   Negative genetic testing:  No pathogenic variants detected on the [facility] CustomNext-Cancer + RNAinsight panel. A  variant of uncertain significance (VUS) was detected in the MSH2 gene called c.1600C>T (p.R534C). The report date is 08/17/2020.  The CustomNext-Cancer+RNAinsight panel offered by [facility] includes sequencing and rearrangement analysis for up to 91 genes, which included the following 47 genes for Ms. Pederson:  APC*, ATM*, AXIN2, BARD1, BMPR1A, BRCA1*, BRCA2*, BRIP1*, CDH1*, CDK4, CDKN2A, CHEK2*, DICER1, EPCAM, GREM1, HOXB13, MEN1, MLH1*, MSH2*, MSH3, MSH6*, MUTYH*, NBN, NF1*, NF2, NTHL1, PALB2*, PMS2*, POLD1, POLE, PTEN*, RAD51C*, RAD51D*, RECQL, RET, SDHA, SDHAF2, SDHB, SDHC, SDHD, SMAD4, SMARCA4, STK11, TP53*, TSC1, TSC2, and VHL.  DNA and RNA analyses performed for * genes.       FAMILY HISTORY:  We obtained a detailed, 4-generation family history.  Significant diagnoses are listed below: Family History  Problem Relation Age of Onset  . Heart disease Mother   . Lung cancer Mother 69       smoker  . Heart disease Father   . Esophageal cancer Father        GE junction, dx. in his early 70s  . Diverticulitis Sister   . Diabetes Sister   . Diabetes Maternal Grandmother   . Heart disease Maternal Grandmother   . Throat cancer Maternal Grandmother        dx. late 87s  . Healthy Sister   . Healthy Daughter   . Healthy Son   . Breast cancer Paternal Aunt        limited info   Ms. Elie has a son (age 47) and a daughter (age 50). She has one adopted sister and two biological sisters who are in their 71s. None of these family members have had cancer.  Ms. Dingwall mother  died at the age of 77 from lung cancer, which had been diagnosed around the age of 7. Her mother was a smoker. Ms. Mcgovern mother did not have any siblings. Her maternal grandmother died in her late 44s from throat cancer, and she does not have information about her maternal grandfather. There are no other known diagnoses of cancer on the maternal side of the family.  Ms. Mao father died in his 40s from GE junction  cancer, which had been diagnosed in his early 68s. She had three paternal aunts and three paternal uncles. One aunt had breast cancer, although she does not know at what age. She does not know of cancer among paternal first cousins. Her paternal grandparents died older than 46 and did not have cancer. There are no other known diagnoses of cancer on the paternal side of the family.  Ms. Marich is unaware of previous family history of genetic testing for hereditary cancer risks. Patient's maternal ancestors are of [place] descent, and paternal ancestors are of unknown descent. There is no reported Ashkenazi Jewish ancestry. There is no known consanguinity.  GENETIC TEST RESULTS: Genetic testing reported out on 08/17/2020 through the [facility] CustomNext-Cancer + RNAinsight panel. No pathogenic variants were detected.   The CustomNext-Cancer+RNAinsight panel offered by [facility] includes sequencing and rearrangement analysis for up to 91 genes, which included the following 47 genes for Ms. Detweiler:  APC*, ATM*, AXIN2, BARD1, BMPR1A, BRCA1*, BRCA2*, BRIP1*, CDH1*, CDK4, CDKN2A, CHEK2*, DICER1, EPCAM, GREM1, HOXB13, MEN1, MLH1*, MSH2*, MSH3, MSH6*, MUTYH*, NBN, NF1*, NF2, NTHL1, PALB2*, PMS2*, POLD1, POLE, PTEN*, RAD51C*, RAD51D*, RECQL, RET, SDHA, SDHAF2, SDHB, SDHC, SDHD, SMAD4, SMARCA4, STK11, TP53*, TSC1, TSC2, and VHL.  DNA and RNA analyses performed for * genes. The test report will be scanned into EPIC and located under the Molecular Pathology section of the Results Review tab.  A portion of the result report is included below for reference.     We discussed with Ms. Mcwhirt that because current genetic testing is not perfect, it is possible there may be a gene mutation in one of these genes that current testing cannot detect, but that chance is small.  We also discussed that there could be another gene that has not yet been discovered, or that we have not yet tested, that is responsible for the cancer  diagnoses in the family. It is also possible there is a hereditary cause for the cancer in the family that Ms. Pinard did not inherit and therefore was not identified in her testing.  Therefore, it is important to remain in touch with cancer genetics in the future so that we can continue to offer Ms. Orzel the most up to date genetic testing.   Genetic testing did identify a variant of uncertain significance (VUS) in the MSH2 gene called c.1600C>T (p.R534C).  At this time, it is unknown if this variant is associated with increased cancer risk or if this is a normal finding, but most variants such as this get reclassified to being inconsequential. It should not be used to make medical management decisions. With time, we suspect the lab will determine the significance of this variant, if any. If we do learn more about it, we will try to contact Ms. Throgmorton to discuss it further. However, it is important to stay in touch with [place] periodically and keep the address and phone number up to date.  CANCER SCREENING RECOMMENDATIONS: Ms. Aydin test result is considered negative (normal).  This means that we have not  identified a hereditary cause for her personal and family history of cancer at this time. While reassuring, this does not definitively rule out a hereditary predisposition to cancer. It is still possible that there could be genetic mutations that are undetectable by current technology. There could be genetic mutations in genes that have not been tested or identified to increase cancer risk.  Therefore, it is recommended she continue to follow the cancer management and screening guidelines provided by her oncology and primary healthcare providers.   An individual's cancer risk and medical management are not determined by genetic test results alone. Overall cancer risk assessment incorporates additional factors, including personal medical history, family history, and any available genetic information that may  result in a personalized plan for cancer prevention and surveillance.  RECOMMENDATIONS FOR FAMILY MEMBERS:  Individuals in this family might be at some increased risk of developing cancer, over the general population risk, simply due to the family history of cancer.  We recommended women in this family have a yearly mammogram beginning at age 15, or 47 years younger than the earliest onset of cancer, an annual clinical breast exam, and perform monthly breast self-exams. Women in this family should also have a gynecological exam as recommended by their primary provider. All family members should be referred for colonoscopy starting at age 49.  FOLLOW-UP: Lastly, we discussed with Ms. Mersch that cancer genetics is a rapidly advancing field and it is possible that new genetic tests will be appropriate for her and/or her family members in the future. We encouraged her to remain in contact with cancer genetics on an annual basis so we can update her personal and family histories and let her know of advances in cancer genetics that may benefit this family.   Our contact number was provided. Ms. Sime questions were answered to her satisfaction, and she knows she is welcome to call us at anytime with additional questions or concerns.   Clint Guy, MS, [facility] Genetic Counselor [email or web address] Phone: [phone]

## 2020-08-28 ENCOUNTER — Ambulatory Visit (HOSPITAL_COMMUNITY): Payer: BC Managed Care – PPO | Admitting: Hematology

## 2020-08-28 ENCOUNTER — Ambulatory Visit (HOSPITAL_COMMUNITY): Payer: BC Managed Care – PPO

## 2020-08-28 ENCOUNTER — Other Ambulatory Visit (HOSPITAL_COMMUNITY): Payer: BC Managed Care – PPO

## 2020-08-30 ENCOUNTER — Ambulatory Visit (HOSPITAL_COMMUNITY): Payer: BC Managed Care – PPO

## 2020-08-31 ENCOUNTER — Inpatient Hospital Stay (HOSPITAL_COMMUNITY): Payer: BC Managed Care – PPO | Attending: Hematology | Admitting: Hematology

## 2020-08-31 ENCOUNTER — Other Ambulatory Visit: Payer: Self-pay

## 2020-08-31 ENCOUNTER — Inpatient Hospital Stay (HOSPITAL_COMMUNITY): Payer: BC Managed Care – PPO

## 2020-08-31 VITALS — BP 112/76 | HR 89 | Temp 96.7°F | Resp 18 | Wt 164.0 lb

## 2020-08-31 DIAGNOSIS — R110 Nausea: Secondary | ICD-10-CM | POA: Diagnosis not present

## 2020-08-31 DIAGNOSIS — N6453 Retraction of nipple: Secondary | ICD-10-CM | POA: Diagnosis not present

## 2020-08-31 DIAGNOSIS — C50919 Malignant neoplasm of unspecified site of unspecified female breast: Secondary | ICD-10-CM

## 2020-08-31 DIAGNOSIS — Z5111 Encounter for antineoplastic chemotherapy: Secondary | ICD-10-CM | POA: Insufficient documentation

## 2020-08-31 DIAGNOSIS — Z803 Family history of malignant neoplasm of breast: Secondary | ICD-10-CM | POA: Diagnosis not present

## 2020-08-31 DIAGNOSIS — Z800 Family history of malignant neoplasm of digestive organs: Secondary | ICD-10-CM | POA: Insufficient documentation

## 2020-08-31 DIAGNOSIS — C50012 Malignant neoplasm of nipple and areola, left female breast: Secondary | ICD-10-CM | POA: Insufficient documentation

## 2020-08-31 DIAGNOSIS — Z8541 Personal history of malignant neoplasm of cervix uteri: Secondary | ICD-10-CM | POA: Insufficient documentation

## 2020-08-31 DIAGNOSIS — Z87891 Personal history of nicotine dependence: Secondary | ICD-10-CM | POA: Insufficient documentation

## 2020-08-31 DIAGNOSIS — Z801 Family history of malignant neoplasm of trachea, bronchus and lung: Secondary | ICD-10-CM | POA: Diagnosis not present

## 2020-08-31 DIAGNOSIS — E876 Hypokalemia: Secondary | ICD-10-CM | POA: Diagnosis not present

## 2020-08-31 DIAGNOSIS — Z5189 Encounter for other specified aftercare: Secondary | ICD-10-CM | POA: Insufficient documentation

## 2020-08-31 DIAGNOSIS — Z95828 Presence of other vascular implants and grafts: Secondary | ICD-10-CM

## 2020-08-31 DIAGNOSIS — Z171 Estrogen receptor negative status [ER-]: Secondary | ICD-10-CM | POA: Diagnosis present

## 2020-08-31 LAB — COMPREHENSIVE METABOLIC PANEL
ALT: 23 U/L (ref 0–44)
AST: 18 U/L (ref 15–41)
Albumin: 3.8 g/dL (ref 3.5–5.0)
Alkaline Phosphatase: 86 U/L (ref 38–126)
Anion gap: 11 (ref 5–15)
BUN: 13 mg/dL (ref 6–20)
CO2: 24 mmol/L (ref 22–32)
Calcium: 9 mg/dL (ref 8.9–10.3)
Chloride: 102 mmol/L (ref 98–111)
Creatinine, Ser: 0.88 mg/dL (ref 0.44–1.00)
GFR calc Af Amer: >60 mL/min (ref >60)
GFR calc non Af Amer: >60 mL/min (ref >60)
Glucose, Bld: 198 mg/dL — ABNORMAL HIGH (ref 70–99)
Potassium: 3.9 mmol/L (ref 3.5–5.1)
Sodium: 137 mmol/L (ref 135–145)
Total Bilirubin: 0.5 mg/dL (ref 0.3–1.2)
Total Protein: 7.1 g/dL (ref 6.5–8.1)

## 2020-08-31 LAB — CBC WITH DIFFERENTIAL/PLATELET
Abs Immature Granulocytes: 0.26 10*3/uL — ABNORMAL HIGH (ref 0.00–0.07)
Basophils Absolute: 0.1 10*3/uL (ref 0.0–0.1)
Basophils Relative: 1 %
Eosinophils Absolute: 0.2 10*3/uL (ref 0.0–0.5)
Eosinophils Relative: 2 %
HCT: 36.1 % (ref 36.0–46.0)
Hemoglobin: 11.8 g/dL — ABNORMAL LOW (ref 12.0–15.0)
Immature Granulocytes: 3 %
Lymphocytes Relative: 16 %
Lymphs Abs: 1.7 10*3/uL (ref 0.7–4.0)
MCH: 28 pg (ref 26.0–34.0)
MCHC: 32.7 g/dL (ref 30.0–36.0)
MCV: 85.5 fL (ref 80.0–100.0)
Monocytes Absolute: 0.9 10*3/uL (ref 0.1–1.0)
Monocytes Relative: 9 %
Neutro Abs: 7.2 10*3/uL (ref 1.7–7.7)
Neutrophils Relative %: 69 %
Platelets: 290 10*3/uL (ref 150–400)
RBC: 4.22 MIL/uL (ref 3.87–5.11)
RDW: 14.6 % (ref 11.5–15.5)
WBC: 10.4 10*3/uL (ref 4.0–10.5)
nRBC: 0 % (ref 0.0–0.2)

## 2020-08-31 NOTE — Progress Notes (Signed)
Hold Fulphila this cycle and give OnPro due to Friday treatment and holiday Monday.  PA is approved.  T.O. Dr Rhys Martini, PharmD

## 2020-08-31 NOTE — Patient Instructions (Signed)
[place] at [facility] Discharge Instructions  You were seen today by Dr. Delton Coombes. He went over your recent results. Proceed with your treatment tomorrow. Dr. Delton Coombes will see you back in 2 weeks for labs and follow up.   Thank you for choosing [facility] at [facility] to provide your oncology and hematology care.  To afford each patient quality time with our provider, please arrive at least 15 minutes before your scheduled appointment time.   If you have a lab appointment with the [facility] please come in thru the Main Entrance and check in at the main information desk  You need to re-schedule your appointment should you arrive 10 or more minutes late.  We strive to give you quality time with our providers, and arriving late affects you and other patients whose appointments are after yours.  Also, if you no show three or more times for appointments you may be dismissed from the clinic at the providers discretion.     Again, thank you for choosing [facility].  Our hope is that these requests will decrease the amount of time that you wait before being seen by our physicians.       _____________________________________________________________  Should you have questions after your visit to [facility], please contact our office at [phone] between the hours of 8:00 a.m. and 4:30 p.m.  Voicemails left after 4:00 p.m. will not be returned until the following business day.  For prescription refill requests, have your pharmacy contact our office and allow 72 hours.    Cancer Center Support Programs:   > Cancer Support Group  2nd Tuesday of the month 1pm-2pm, Journey Room

## 2020-08-31 NOTE — Progress Notes (Signed)
Elizabeth Graves, [place] [id number]   CLINIC:  Medical Oncology/Hematology  PCP:  Kathyrn Drown, MD [place] / [place] [address] [phone]   REASON FOR VISIT:  Follow-up for left breast cancer  PRIOR THERAPY: None  NGS Results: Not done  CURRENT THERAPY: Dose-dense Adriamycin and cyclophosphamide  BRIEF ONCOLOGIC HISTORY:  Oncology History  Triple negative malignant neoplasm of breast ([place])  07/05/2020 Initial Diagnosis   Triple negative malignant neoplasm of breast ([place])   08/01/2020 -  Chemotherapy   The patient had DOXOrubicin (ADRIAMYCIN) chemo injection 104 mg, 60 mg/m2 = 104 mg, Intravenous,  Once, 2 of 4 cycles Administration: 104 mg (08/01/2020), 104 mg (08/14/2020) palonosetron (ALOXI) injection 0.25 mg, 0.25 mg, Intravenous,  Once, 2 of 8 cycles Administration: 0.25 mg (08/01/2020), 0.25 mg (08/14/2020) pegfilgrastim (NEULASTA ONPRO KIT) injection 6 mg, 6 mg, Subcutaneous, Once, 0 of 1 cycle pegfilgrastim-jmdb (FULPHILA) injection 6 mg, 6 mg, Subcutaneous,  Once, 2 of 3 cycles Administration: 6 mg (08/03/2020), 6 mg (08/16/2020) CARBOplatin (PARAPLATIN) in sodium chloride 0.9 % 100 mL chemo infusion, , Intravenous,  Once, 0 of 4 cycles cyclophosphamide (CYTOXAN) 1,040 mg in sodium chloride 0.9 % 250 mL chemo infusion, 600 mg/m2 = 1,040 mg, Intravenous,  Once, 2 of 4 cycles Administration: 1,040 mg (08/01/2020), 1,040 mg (08/14/2020) PACLitaxel (TAXOL) 138 mg in sodium chloride 0.9 % 250 mL chemo infusion (</= $RemoveBefor'80mg'lwZqomfGKDuM$ /m2), 80 mg/m2, Intravenous,  Once, 0 of 4 cycles fosaprepitant (EMEND) 150 mg in sodium chloride 0.9 % 145 mL IVPB, 150 mg, Intravenous,  Once, 2 of 8 cycles Administration: 150 mg (08/01/2020), 150 mg (08/14/2020)  for chemotherapy treatment.    08/17/2020 Genetic Testing   Negative genetic testing:  No pathogenic variants detected on the [facility] CustomNext-Cancer + RNAinsight panel. A variant of uncertain significance  (VUS) was detected in the MSH2 gene called c.1600C>T (p.R534C). The report date is 08/17/2020.  The CustomNext-Cancer+RNAinsight panel offered by [facility] includes sequencing and rearrangement analysis for up to 91 genes, which included the following 47 genes for Ms. Foree:  APC*, ATM*, AXIN2, BARD1, BMPR1A, BRCA1*, BRCA2*, BRIP1*, CDH1*, CDK4, CDKN2A, CHEK2*, DICER1, EPCAM, GREM1, HOXB13, MEN1, MLH1*, MSH2*, MSH3, MSH6*, MUTYH*, NBN, NF1*, NF2, NTHL1, PALB2*, PMS2*, POLD1, POLE, PTEN*, RAD51C*, RAD51D*, RECQL, RET, SDHA, SDHAF2, SDHB, SDHC, SDHD, SMAD4, SMARCA4, STK11, TP53*, TSC1, TSC2, and VHL.  DNA and RNA analyses performed for * genes.       CANCER STAGING: Cancer Staging No matching staging information was found for the patient.  INTERVAL HISTORY:  [name], a 54 y.o. female, returns for routine follow-up of her left breast cancer. Georgeanne was last seen on 08/14/2020.   Today she reports that she tolerated the previous chemo treatment well; she denies having N/V/D, SOB, dyspnea, leg swelling or mouth sores. Her appetite is good. She denies any skin peeling.   REVIEW OF SYSTEMS:  Review of Systems  Constitutional: Positive for fatigue (mild). Negative for appetite change.  HENT:   Negative for mouth sores.   Respiratory: Negative for shortness of breath.   Cardiovascular: Negative for leg swelling.  Gastrointestinal: Negative for diarrhea, nausea and vomiting.  Neurological: Positive for headaches.  All other systems reviewed and are negative.   PAST MEDICAL/SURGICAL HISTORY:  Past Medical History:  Diagnosis Date  . Asthma   . Cancer ([place])    cervical - 20 yrs ago  . Carpal tunnel syndrome   . Depression   .  Family history of breast cancer   . Family history of esophageal cancer   . Family history of lung cancer   . Family history of throat cancer   . Hypertension   . Hypokalemia    tx w/ k-Dur  . Port-A-Cath in place 07/24/2020  . Seasonal allergies     tx with benadryl prn  . SVD (spontaneous vaginal delivery)    x 2   Past Surgical History:  Procedure Laterality Date  . ABDOMINAL HYSTERECTOMY    . CO2 Laser of Cervix     for cervical cancer 20 yrs ago  . DILATION AND CURETTAGE OF UTERUS  12/2010   polyp removed  . DILATION AND CURETTAGE OF UTERUS     x 3 for MAB  . LAPAROSCOPIC ASSISTED VAGINAL HYSTERECTOMY  06/09/2012   Procedure: LAPAROSCOPIC ASSISTED VAGINAL HYSTERECTOMY;  Surgeon: Gus Height, MD;  Location: [facility];  Service: Gynecology;  Laterality: N/A;  . PORTACATH PLACEMENT Right 07/17/2020   Procedure: INSERTION PORT-A-CATH;  Surgeon: Virl Cagey, MD;  Location: [facility];  Service: General;  Laterality: Right;  . SALPINGOOPHORECTOMY  06/09/2012   Procedure: SALPINGO OOPHERECTOMY;  Surgeon: Gus Height, MD;  Location: [facility];  Service: Gynecology;  Laterality: Bilateral;  . TUBAL LIGATION      SOCIAL HISTORY:  Social History   Socioeconomic History  . Marital status: Married    Spouse name: Not on file  . Number of children: 2  . Years of education: Not on file  . Highest education level: Not on file  Occupational History  . Occupation: EMPLOYED  Tobacco Use  . Smoking status: Former Smoker    Packs/day: 1.00    Years: 16.00    Pack years: 16.00    Types: Cigarettes    Start date: 05/30/1994    Quit date: 01/31/2020    Years since quitting: 0.5  . Smokeless tobacco: Never Used  Vaping Use  . Vaping Use: Never used  Substance and Sexual Activity  . Alcohol use: No  . Drug use: No  . Sexual activity: Yes    Birth control/protection: Surgical  Other Topics Concern  . Not on file  Social History Narrative  . Not on file   Social Determinants of Health   Financial Resource Strain: Low Risk   . Difficulty of Paying Living Expenses: Not very hard  Food Insecurity: No Food Insecurity  . Worried About Charity fundraiser in the Last Year: Never true  . Ran Out of Food in the Last Year: Never true   Transportation Needs: No Transportation Needs  . Lack of Transportation (Medical): No  . Lack of Transportation (Non-Medical): No  Physical Activity: Inactive  . Days of Exercise per Week: 0 days  . Minutes of Exercise per Session: 0 min  Stress: Stress Concern Present  . Feeling of Stress : To some extent  Social Connections: Moderately Isolated  . Frequency of Communication with Friends and Family: Three times a week  . Frequency of Social Gatherings with Friends and Family: Once a week  . Attends Religious Services: Never  . Active Member of Clubs or Organizations: No  . Attends Archivist Meetings: Never  . Marital Status: Married  Human resources officer Violence: Not At Risk  . Fear of Current or Ex-Partner: No  . Emotionally Abused: No  . Physically Abused: No  . Sexually Abused: No    FAMILY HISTORY:  Family History  Problem Relation Age of Onset  . Heart  disease Mother   . Lung cancer Mother 104       smoker  . Heart disease Father   . Esophageal cancer Father        GE junction, dx. in his early 18s  . Diverticulitis Sister   . Diabetes Sister   . Diabetes Maternal Grandmother   . Heart disease Maternal Grandmother   . Throat cancer Maternal Grandmother        dx. late 12s  . Healthy Sister   . Healthy Daughter   . Healthy Son   . Breast cancer Paternal Aunt        limited info    CURRENT MEDICATIONS:  Current Outpatient Medications  Medication Sig Dispense Refill  . albuterol (VENTOLIN HFA) 108 (90 Base) MCG/ACT inhaler TAKE 2 PUFFS BY MOUTH EVERY 6 HOURS AS NEEDED FOR WHEEZE OR SHORTNESS OF BREATH 8.5 Inhaler 0  . atorvastatin (LIPITOR) 20 MG tablet TAKE 1 TABLET BY MOUTH EVERY DAY 90 tablet 1  . CYCLOPHOSPHAMIDE IV Inject into the vein every 14 (fourteen) days.    . diphenhydrAMINE (BENADRYL) 25 mg capsule Take 25 mg by mouth every 6 (six) hours as needed for allergies. Allergies.    Marland Kitchen DOXOrubicin HCl (ADRIAMYCIN IV) Inject into the vein every 14  (fourteen) days.    Marland Kitchen esomeprazole (NEXIUM) 20 MG capsule Take 20 mg by mouth daily before breakfast.    . ibuprofen (ADVIL,MOTRIN) 200 MG tablet Take 400-600 mg by mouth every 8 (eight) hours as needed for moderate pain (pain.).     Marland Kitchen lisinopril (ZESTRIL) 5 MG tablet TAKE 1 TABLET BY MOUTH EVERY DAY 90 tablet 1  . naproxen sodium (ALEVE) 220 MG tablet Take 440 mg by mouth 3 (three) times daily as needed (pain.).     Marland Kitchen pegfilgrastim-jmdb (FULPHILA) 6 MG/0.6ML injection Inject 6 mg into the skin every 14 (fourteen) days.    . potassium chloride (KLOR-CON) 10 MEQ tablet Take 1 tablet (10 mEq total) by mouth daily. 30 tablet 2  . sertraline (ZOLOFT) 50 MG tablet TAKE 1 TABLET BY MOUTH EVERY DAY 90 tablet 0  . lidocaine-prilocaine (EMLA) cream Apply a pea sized amount to port a cath site and cover with plastic wrap 1 hour prior to chemotherapy appointments (Patient not taking: Reported on 08/31/2020) 30 g 3  . prochlorperazine (COMPAZINE) 10 MG tablet Take 1 tablet (10 mg total) by mouth every 6 (six) hours as needed (Nausea or vomiting). (Patient not taking: Reported on 08/31/2020) 30 tablet 1   No current facility-administered medications for this visit.    ALLERGIES:  No Known Allergies  PHYSICAL EXAM:  Performance status (ECOG): 0 - Asymptomatic  Vitals:   08/31/20 1557  BP: 112/76  Pulse: 89  Resp: 18  Temp: (!) 96.7 F (35.9 C)  SpO2: 99%   Wt Readings from Last 3 Encounters:  08/31/20 164 lb 0.4 oz (74.4 kg)  08/14/20 164 lb 14.4 oz (74.8 kg)  08/01/20 167 lb (75.8 kg)   Physical Exam Vitals reviewed.  Constitutional:      Appearance: Normal appearance. She is obese.  Cardiovascular:     Rate and Rhythm: Normal rate and regular rhythm.     Pulses: Normal pulses.     Heart sounds: Normal heart sounds.  Pulmonary:     Effort: Pulmonary effort is normal.     Breath sounds: Normal breath sounds.  Chest:     Comments: Port-a-Cath in R chest Musculoskeletal:     Right lower  leg: No edema.     Left lower leg: No edema.  Neurological:     General: No focal deficit present.     Mental Status: She is alert and oriented to person, place, and time.  Psychiatric:        Mood and Affect: Mood normal.        Behavior: Behavior normal.      LABORATORY DATA:  I have reviewed the labs as listed.  CBC Latest Ref Rng & Units 08/31/2020 08/14/2020 07/31/2020  WBC 4.0 - 10.5 K/uL 10.4 11.3(H) 8.5  Hemoglobin 12.0 - 15.0 g/dL 11.8(L) 12.4 13.1  Hematocrit 36 - 46 % 36.1 37.7 39.1  Platelets 150 - 400 K/uL 290 216 334   CMP Latest Ref Rng & Units 08/14/2020 07/31/2020 07/05/2020  Glucose 70 - 99 mg/dL 233(H) 181(H) 114(H)  BUN 6 - 20 mg/dL $Remove'16 14 15  'QBQtxXG$ Creatinine 0.44 - 1.00 mg/dL 0.79 0.97 0.95  Sodium 135 - 145 mmol/L 134(L) 137 137  Potassium 3.5 - 5.1 mmol/L 3.4(L) 3.2(L) 3.8  Chloride 98 - 111 mmol/L 101 104 103  CO2 22 - 32 mmol/L $RemoveB'24 24 25  'wNKTmWBp$ Calcium 8.9 - 10.3 mg/dL 8.8(L) 9.2 9.2  Total Protein 6.5 - 8.1 g/dL 6.9 7.2 7.6  Total Bilirubin 0.3 - 1.2 mg/dL 0.3 0.5 0.4  Alkaline Phos 38 - 126 U/L 97 85 86  AST 15 - 41 U/L 13(L) 16 17  ALT 0 - 44 U/L $Remo'19 23 24    'kFgWX$ DIAGNOSTIC IMAGING:  I have independently reviewed the scans and discussed with the patient. No results found.   ASSESSMENT:  1. Triple negative left breast cancer (T2N1): -Patient identified left breast mass with left nipple retraction about 2 months ago. -Mammogram on 06/13/2020 showed mass in the retroareolar 12 o'clock position in the left breast. 2 suspicious left axillary lymph nodes, largest measuring 2 cm. -Breast ultrasound showed 2.7 x 1.6 x 2.2 cm mass in the 12:00 retroareolar position. Just lateral to the mass is a calcified hypoechoic nodule that corresponds to densely calcified degenerating fibroadenoma. -Left breast 12:00 biopsy showed invasive ductal carcinoma, grade 2/3. Left axillary lymph node biopsy was consistent with meta stasis. -ER negative, PR negative and Ki-67 15%. HER-2 equivocal  by IHC. HER-2 FISH negative. -MRI of the breast on 07/19/2020 shows 4.2 cm enhancing mass in the subareolar left breast, enhancement extending to the level of the left nipple with associated nipple retraction and periareolar skin thickening. 6 morphologically abnormal level 1 left axillary lymph nodes. No evidence of malignancy in the right breast. -CT CAP on 07/14/2020 shows dominant left axillary nodes measuring 1.8 cm short axis. Small left subpectoral nodes measuring up to 7 mm short axis, suspicious. Small mediastinal lymph nodes measuring 7 to 9 mm, thought to be reactive. No other evidence of metastatic disease. -Neoadjuvant Dose dense AC started on 08/01/2020.  2. Family history: -Father had gastroesophageal junction cancer. Maternal grandmother had throat cancer and paternal aunt had breast cancer.  3. High risk drug monitoring: -Echo on 07/12/2020 shows EF 60 to 65%.   PLAN:  1. T2N1 left breast TNBC: -She has tolerated second cycle very well except some mild nausea. -Reviewed her LFTs today which were grossly within normal limits.  CBC was also normal. -We will proceed with her cycle 3 today.  We will plan to evaluate her in 2 weeks.  2. Family history: -She already met with our geneticist.  Results pending.  3.  Hypokalemia: -Continue potassium 10  mEq daily.  Potassium today 3.9.   Orders placed this encounter:  No orders of the defined types were placed in this encounter.    Derek Jack, MD [place] [phone]   I, Milinda Antis, am acting as a scribe for Dr. Sanda Linger.  I, Derek Jack MD, have reviewed the above documentation for accuracy and completeness, and I agree with the above.

## 2020-08-31 NOTE — Progress Notes (Signed)
Patient was assessed by Dr. Delton Coombes and labs have been reviewed.  Patient is okay to proceed with treatment tomorrow. Primary RN and pharmacy aware.

## 2020-09-01 ENCOUNTER — Inpatient Hospital Stay (HOSPITAL_COMMUNITY): Payer: BC Managed Care – PPO

## 2020-09-01 VITALS — BP 110/65 | HR 81 | Temp 96.9°F | Resp 17

## 2020-09-01 DIAGNOSIS — C50919 Malignant neoplasm of unspecified site of unspecified female breast: Secondary | ICD-10-CM

## 2020-09-01 DIAGNOSIS — Z5111 Encounter for antineoplastic chemotherapy: Secondary | ICD-10-CM | POA: Diagnosis not present

## 2020-09-01 DIAGNOSIS — Z95828 Presence of other vascular implants and grafts: Secondary | ICD-10-CM

## 2020-09-01 MED ORDER — DOXORUBICIN HCL CHEMO IV INJECTION 2 MG/ML
60.0000 mg/m2 | Freq: Once | INTRAVENOUS | Status: AC
  Administered 2020-09-01: 104 mg via INTRAVENOUS
  Filled 2020-09-01: qty 52

## 2020-09-01 MED ORDER — SODIUM CHLORIDE 0.9 % IV SOLN
10.0000 mg | Freq: Once | INTRAVENOUS | Status: AC
  Administered 2020-09-01: 10 mg via INTRAVENOUS
  Filled 2020-09-01: qty 10

## 2020-09-01 MED ORDER — SODIUM CHLORIDE 0.9% FLUSH
10.0000 mL | INTRAVENOUS | Status: DC | PRN
  Administered 2020-09-01: 10 mL

## 2020-09-01 MED ORDER — SODIUM CHLORIDE 0.9 % IV SOLN
600.0000 mg/m2 | Freq: Once | INTRAVENOUS | Status: AC
  Administered 2020-09-01: 1040 mg via INTRAVENOUS
  Filled 2020-09-01: qty 52

## 2020-09-01 MED ORDER — HEPARIN SOD (PORK) LOCK FLUSH 100 UNIT/ML IV SOLN
500.0000 [IU] | Freq: Once | INTRAVENOUS | Status: AC | PRN
  Administered 2020-09-01: 500 [IU]

## 2020-09-01 MED ORDER — SODIUM CHLORIDE 0.9 % IV SOLN: Freq: Once | INTRAVENOUS | Status: AC

## 2020-09-01 MED ORDER — SODIUM CHLORIDE 0.9 % IV SOLN
150.0000 mg | Freq: Once | INTRAVENOUS | Status: AC
  Administered 2020-09-01: 150 mg via INTRAVENOUS
  Filled 2020-09-01: qty 150

## 2020-09-01 MED ORDER — PALONOSETRON HCL INJECTION 0.25 MG/5ML
0.2500 mg | Freq: Once | INTRAVENOUS | Status: AC
  Administered 2020-09-01: 0.25 mg via INTRAVENOUS
  Filled 2020-09-01: qty 5

## 2020-09-01 MED ORDER — PEGFILGRASTIM 6 MG/0.6ML ~~LOC~~ PSKT
6.0000 mg | PREFILLED_SYRINGE | Freq: Once | SUBCUTANEOUS | Status: AC
  Administered 2020-09-01: 6 mg via SUBCUTANEOUS
  Filled 2020-09-01: qty 0.6

## 2020-09-01 NOTE — Progress Notes (Signed)
Ok to treat today per MD. Patient seen yesterday at office visit.    Marland KitchenCharyl Dancer Graves arrived today for ONPRO neulasta on body injector. See MAR for administration details. Injector in place and engaged with green light indicator on flashing. Tolerated application with out problems.   Treatment given per orders. Patient tolerated it well without problems. Vitals stable and discharged home from clinic ambulatory. Follow up as scheduled.

## 2020-09-02 ENCOUNTER — Ambulatory Visit (HOSPITAL_COMMUNITY): Payer: BC Managed Care – PPO

## 2020-09-14 ENCOUNTER — Inpatient Hospital Stay (HOSPITAL_COMMUNITY): Payer: BC Managed Care – PPO

## 2020-09-14 ENCOUNTER — Encounter (HOSPITAL_COMMUNITY): Payer: Self-pay | Admitting: Hematology

## 2020-09-14 ENCOUNTER — Inpatient Hospital Stay (HOSPITAL_BASED_OUTPATIENT_CLINIC_OR_DEPARTMENT_OTHER): Payer: BC Managed Care – PPO | Admitting: Hematology

## 2020-09-14 ENCOUNTER — Other Ambulatory Visit: Payer: Self-pay

## 2020-09-14 VITALS — BP 126/82 | HR 92 | Temp 96.8°F | Resp 16 | Wt 163.4 lb

## 2020-09-14 DIAGNOSIS — C50919 Malignant neoplasm of unspecified site of unspecified female breast: Secondary | ICD-10-CM

## 2020-09-14 DIAGNOSIS — Z95828 Presence of other vascular implants and grafts: Secondary | ICD-10-CM

## 2020-09-14 DIAGNOSIS — Z5111 Encounter for antineoplastic chemotherapy: Secondary | ICD-10-CM | POA: Diagnosis not present

## 2020-09-14 LAB — CBC WITH DIFFERENTIAL/PLATELET
Band Neutrophils: 4 %
Basophils Absolute: 0.1 10*3/uL (ref 0.0–0.1)
Basophils Relative: 1 %
Eosinophils Absolute: 0.7 10*3/uL — ABNORMAL HIGH (ref 0.0–0.5)
Eosinophils Relative: 5 %
HCT: 36.2 % (ref 36.0–46.0)
Hemoglobin: 12 g/dL (ref 12.0–15.0)
Lymphocytes Relative: 13 %
Lymphs Abs: 1.8 10*3/uL (ref 0.7–4.0)
MCH: 28.2 pg (ref 26.0–34.0)
MCHC: 33.1 g/dL (ref 30.0–36.0)
MCV: 85 fL (ref 80.0–100.0)
Metamyelocytes Relative: 3 %
Monocytes Absolute: 0.5 10*3/uL (ref 0.1–1.0)
Monocytes Relative: 4 %
Myelocytes: 5 %
Neutro Abs: 8.9 10*3/uL — ABNORMAL HIGH (ref 1.7–7.7)
Neutrophils Relative %: 61 %
Platelets: 214 10*3/uL (ref 150–400)
Promyelocytes Relative: 4 %
RBC: 4.26 MIL/uL (ref 3.87–5.11)
RDW: 15.5 % (ref 11.5–15.5)
WBC: 13.7 10*3/uL — ABNORMAL HIGH (ref 4.0–10.5)
nRBC: 0.2 % (ref 0.0–0.2)

## 2020-09-14 LAB — COMPREHENSIVE METABOLIC PANEL
ALT: 19 U/L (ref 0–44)
AST: 16 U/L (ref 15–41)
Albumin: 3.7 g/dL (ref 3.5–5.0)
Alkaline Phosphatase: 96 U/L (ref 38–126)
Anion gap: 11 (ref 5–15)
BUN: 11 mg/dL (ref 6–20)
CO2: 24 mmol/L (ref 22–32)
Calcium: 9.2 mg/dL (ref 8.9–10.3)
Chloride: 98 mmol/L (ref 98–111)
Creatinine, Ser: 0.8 mg/dL (ref 0.44–1.00)
GFR calc Af Amer: >60 mL/min (ref >60)
GFR calc non Af Amer: >60 mL/min (ref >60)
Glucose, Bld: 263 mg/dL — ABNORMAL HIGH (ref 70–99)
Potassium: 3.5 mmol/L (ref 3.5–5.1)
Sodium: 133 mmol/L — ABNORMAL LOW (ref 135–145)
Total Bilirubin: 0.4 mg/dL (ref 0.3–1.2)
Total Protein: 6.9 g/dL (ref 6.5–8.1)

## 2020-09-14 NOTE — Progress Notes (Signed)
Patient was assessed by Dr. Delton Coombes and labs have been reviewed.  Patient is okay to proceed with treatment tomorrow as scheduled. Pharmacy aware.

## 2020-09-14 NOTE — Patient Instructions (Signed)
[place] at [facility] Discharge Instructions  You were seen today by Dr. Delton Coombes. He went over your recent results. You are cleared for treatment tomorrow. Dr. Delton Coombes will see you back in 2 weeks for labs and follow up.   Thank you for choosing [place] at [facility] to provide your oncology and hematology care.  To afford each patient quality time with our provider, please arrive at least 15 minutes before your scheduled appointment time.   If you have a lab appointment with the [facility] please come in thru the Main Entrance and check in at the main information desk  You need to re-schedule your appointment should you arrive 10 or more minutes late.  We strive to give you quality time with our providers, and arriving late affects you and other patients whose appointments are after yours.  Also, if you no show three or more times for appointments you may be dismissed from the clinic at the providers discretion.     Again, thank you for choosing [facility].  Our hope is that these requests will decrease the amount of time that you wait before being seen by our physicians.       _____________________________________________________________  Should you have questions after your visit to [facility], please contact our office at [phone] between the hours of 8:00 a.m. and 4:30 p.m.  Voicemails left after 4:00 p.m. will not be returned until the following business day.  For prescription refill requests, have your pharmacy contact our office and allow 72 hours.    Cancer Center Support Programs:   > Cancer Support Group  2nd Tuesday of the month 1pm-2pm, Journey Room

## 2020-09-14 NOTE — Progress Notes (Signed)
Elizabeth Graves, [place] [address]   CLINIC:  Medical Oncology/Hematology  PCP:  Elizabeth Drown, MD [address] [place] / [place] [address] [phone]   REASON FOR VISIT:  Follow-up for left breast cancer  PRIOR THERAPY: None  NGS Results: Not done  CURRENT THERAPY: Dose-dense Adriamycin and cyclophosphamide  BRIEF ONCOLOGIC HISTORY:  Oncology History  Triple negative malignant neoplasm of breast ([facility])  07/05/2020 Initial Diagnosis   Triple negative malignant neoplasm of breast ([place])   08/01/2020 -  Chemotherapy   The patient had DOXOrubicin (ADRIAMYCIN) chemo injection 104 mg, 60 mg/m2 = 104 mg, Intravenous,  Once, 3 of 4 cycles Administration: 104 mg (08/01/2020), 104 mg (08/14/2020), 104 mg (09/01/2020) palonosetron (ALOXI) injection 0.25 mg, 0.25 mg, Intravenous,  Once, 3 of 8 cycles Administration: 0.25 mg (08/01/2020), 0.25 mg (08/14/2020), 0.25 mg (09/01/2020) pegfilgrastim (NEULASTA ONPRO KIT) injection 6 mg, 6 mg, Subcutaneous, Once, 1 of 1 cycle Administration: 6 mg (09/01/2020) pegfilgrastim-jmdb (FULPHILA) injection 6 mg, 6 mg, Subcutaneous,  Once, 2 of 3 cycles Administration: 6 mg (08/03/2020), 6 mg (08/16/2020) CARBOplatin (PARAPLATIN) in sodium chloride 0.9 % 100 mL chemo infusion, , Intravenous,  Once, 0 of 4 cycles cyclophosphamide (CYTOXAN) 1,040 mg in sodium chloride 0.9 % 250 mL chemo infusion, 600 mg/m2 = 1,040 mg, Intravenous,  Once, 3 of 4 cycles Administration: 1,040 mg (08/01/2020), 1,040 mg (08/14/2020), 1,040 mg (09/01/2020) PACLitaxel (TAXOL) 138 mg in sodium chloride 0.9 % 250 mL chemo infusion (</= $RemoveBefor'80mg'JaCeumleUHMP$ /m2), 80 mg/m2, Intravenous,  Once, 0 of 4 cycles fosaprepitant (EMEND) 150 mg in sodium chloride 0.9 % 145 mL IVPB, 150 mg, Intravenous,  Once, 3 of 8 cycles Administration: 150 mg (08/01/2020), 150 mg (08/14/2020), 150 mg (09/01/2020)  for chemotherapy treatment.    08/17/2020 Genetic Testing   Negative genetic testing:  No  pathogenic variants detected on the [facility] CustomNext-Cancer + RNAinsight panel. A variant of uncertain significance (VUS) was detected in the MSH2 gene called c.1600C>T (p.R534C). The report date is 08/17/2020.  The CustomNext-Cancer+RNAinsight panel offered by [facility] includes sequencing and rearrangement analysis for up to 91 genes, which included the following 47 genes for Ms. Arvanitis:  APC*, ATM*, AXIN2, BARD1, BMPR1A, BRCA1*, BRCA2*, BRIP1*, CDH1*, CDK4, CDKN2A, CHEK2*, DICER1, EPCAM, GREM1, HOXB13, MEN1, MLH1*, MSH2*, MSH3, MSH6*, MUTYH*, NBN, NF1*, NF2, NTHL1, PALB2*, PMS2*, POLD1, POLE, PTEN*, RAD51C*, RAD51D*, RECQL, RET, SDHA, SDHAF2, SDHB, SDHC, SDHD, SMAD4, SMARCA4, STK11, TP53*, TSC1, TSC2, and VHL.  DNA and RNA analyses performed for * genes.       CANCER STAGING: Cancer Staging No matching staging information was found for the patient.  INTERVAL HISTORY:  Elizabeth Graves, a 54 y.o. female, returns for routine follow-up and consideration for next cycle of chemotherapy. Elizabeth Graves was last seen on 08/31/2020.  Due for cycle #4 of dose-dense Adriamycin and cyclophosphamide tomorrow.   Overall, she tells me she has been feeling pretty well. She is feeling better this week than last week. She had some chest tightness the day after receiving Neulasta. Otherwise she tolerated the chemo well and denies F/C, headache, abdominal pain or mouth sores. Her appetite drops the week she gets chemo but she makes sure to drink plenty of water, then her appetite improves her off-week. She denies palpitations or orthopnea.  Overall, she feels ready for next cycle of chemo tomorrow.    REVIEW OF SYSTEMS:  Review of Systems  Constitutional: Positive for appetite change (moderately decreased the week of  chemo) and fatigue (moderate). Negative for chills and fever.  HENT:   Negative for mouth sores.   Respiratory: Negative for shortness of breath.   Cardiovascular: Negative for palpitations.   Gastrointestinal: Negative for abdominal pain.  Neurological: Negative for headaches.  All other systems reviewed and are negative.   PAST MEDICAL/SURGICAL HISTORY:  Past Medical History:  Diagnosis Date  . Asthma   . Cancer ([facility])    cervical - 20 yrs ago  . Carpal tunnel syndrome   . Depression   . Family history of breast cancer   . Family history of esophageal cancer   . Family history of lung cancer   . Family history of throat cancer   . Hypertension   . Hypokalemia    tx w/ k-Dur  . Port-A-Cath in place 07/24/2020  . Seasonal allergies    tx with benadryl prn  . SVD (spontaneous vaginal delivery)    x 2   Past Surgical History:  Procedure Laterality Date  . ABDOMINAL HYSTERECTOMY    . CO2 Laser of Cervix     for cervical cancer 20 yrs ago  . DILATION AND CURETTAGE OF UTERUS  12/2010   polyp removed  . DILATION AND CURETTAGE OF UTERUS     x 3 for MAB  . LAPAROSCOPIC ASSISTED VAGINAL HYSTERECTOMY  06/09/2012   Procedure: LAPAROSCOPIC ASSISTED VAGINAL HYSTERECTOMY;  Surgeon: Gus Height, MD;  Location: [facility];  Service: Gynecology;  Laterality: N/A;  . PORTACATH PLACEMENT Right 07/17/2020   Procedure: INSERTION PORT-A-CATH;  Surgeon: Virl Cagey, MD;  Location: [facility];  Service: General;  Laterality: Right;  . SALPINGOOPHORECTOMY  06/09/2012   Procedure: SALPINGO OOPHERECTOMY;  Surgeon: Gus Height, MD;  Location: [facility];  Service: Gynecology;  Laterality: Bilateral;  . TUBAL LIGATION      SOCIAL HISTORY:  Social History   Socioeconomic History  . Marital status: Married    Spouse name: Not on file  . Number of children: 2  . Years of education: Not on file  . Highest education level: Not on file  Occupational History  . Occupation: EMPLOYED  Tobacco Use  . Smoking status: Former Smoker    Packs/day: 1.00    Years: 16.00    Pack years: 16.00    Types: Cigarettes    Start date: 05/30/1994    Quit date: 01/31/2020    Years since quitting: 0.6  .  Smokeless tobacco: Never Used  Vaping Use  . Vaping Use: Never used  Substance and Sexual Activity  . Alcohol use: No  . Drug use: No  . Sexual activity: Yes    Birth control/protection: Surgical  Other Topics Concern  . Not on file  Social History Narrative  . Not on file   Social Determinants of Health   Financial Resource Strain: Low Risk   . Difficulty of Paying Living Expenses: Not very hard  Food Insecurity: No Food Insecurity  . Worried About Charity fundraiser in the Last Year: Never true  . Ran Out of Food in the Last Year: Never true  Transportation Needs: No Transportation Needs  . Lack of Transportation (Medical): No  . Lack of Transportation (Non-Medical): No  Physical Activity: Inactive  . Days of Exercise per Week: 0 days  . Minutes of Exercise per Session: 0 min  Stress: Stress Concern Present  . Feeling of Stress : To some extent  Social Connections: Moderately Isolated  . Frequency of Communication with Friends and Family: Three times  a week  . Frequency of Social Gatherings with Friends and Family: Once a week  . Attends Religious Services: Never  . Active Member of Clubs or Organizations: No  . Attends Archivist Meetings: Never  . Marital Status: Married  Human resources officer Violence: Not At Risk  . Fear of Current or Ex-Partner: No  . Emotionally Abused: No  . Physically Abused: No  . Sexually Abused: No    FAMILY HISTORY:  Family History  Problem Relation Age of Onset  . Heart disease Mother   . Lung cancer Mother 52       smoker  . Heart disease Father   . Esophageal cancer Father        GE junction, dx. in his early 25s  . Diverticulitis Sister   . Diabetes Sister   . Diabetes Maternal Grandmother   . Heart disease Maternal Grandmother   . Throat cancer Maternal Grandmother        dx. late 104s  . Healthy Sister   . Healthy Daughter   . Healthy Son   . Breast cancer Paternal Aunt        limited info    CURRENT  MEDICATIONS:  Current Outpatient Medications  Medication Sig Dispense Refill  . lidocaine-prilocaine (EMLA) cream Apply a pea sized amount to port a cath site and cover with plastic wrap 1 hour prior to chemotherapy appointments 30 g 3  . prochlorperazine (COMPAZINE) 10 MG tablet Take 1 tablet (10 mg total) by mouth every 6 (six) hours as needed (Nausea or vomiting). 30 tablet 1  . albuterol (VENTOLIN HFA) 108 (90 Base) MCG/ACT inhaler TAKE 2 PUFFS BY MOUTH EVERY 6 HOURS AS NEEDED FOR WHEEZE OR SHORTNESS OF BREATH 8.5 Inhaler 0  . atorvastatin (LIPITOR) 20 MG tablet TAKE 1 TABLET BY MOUTH EVERY DAY 90 tablet 1  . CYCLOPHOSPHAMIDE IV Inject into the vein every 14 (fourteen) days.    . diphenhydrAMINE (BENADRYL) 25 mg capsule Take 25 mg by mouth every 6 (six) hours as needed for allergies. Allergies.    Marland Kitchen DOXOrubicin HCl (ADRIAMYCIN IV) Inject into the vein every 14 (fourteen) days.    Mar[facility] Kitchen esomeprazole (NEXIUM) 20 MG capsule Take 20 mg by mouth daily before breakfast.    . ibuprofen (ADVIL,MOTRIN) 200 MG tablet Take 400-600 mg by mouth every 8 (eight) hours as needed for moderate pain (pain.).     Marland Kitchen lisinopril (ZESTRIL) 5 MG tablet TAKE 1 TABLET BY MOUTH EVERY DAY 90 tablet 1  . naproxen sodium (ALEVE) 220 MG tablet Take 440 mg by mouth 3 (three) times daily as needed (pain.).     Marland Kitchen pegfilgrastim-jmdb (FULPHILA) 6 MG/0.6ML injection Inject 6 mg into the skin every 14 (fourteen) days.    . potassium chloride (KLOR-CON) 10 MEQ tablet Take 1 tablet (10 mEq total) by mouth daily. 30 tablet 2  . sertraline (ZOLOFT) 50 MG tablet TAKE 1 TABLET BY MOUTH EVERY DAY 90 tablet 0   No current facility-administered medications for this visit.    ALLERGIES:  No Known Allergies  PHYSICAL EXAM:  Performance status (ECOG): 0 - Asymptomatic  Vitals:   09/14/20 0911  BP: 126/82  Pulse: 92  Resp: 16  Temp: (!) 96.8 F (36 C)  SpO2: 95%   Wt Readings from Last 3 Encounters:  09/14/20 163 lb 6.4 oz (74.1  kg)  08/31/20 164 lb 0.4 oz (74.4 kg)  08/14/20 164 lb 14.4 oz (74.8 kg)   Physical Exam Vitals reviewed.  Constitutional:      Appearance: Normal appearance.  Cardiovascular:     Rate and Rhythm: Normal rate and regular rhythm.     Pulses: Normal pulses.     Heart sounds: Normal heart sounds.  Pulmonary:     Effort: Pulmonary effort is normal.     Breath sounds: Normal breath sounds.  Chest:     Comments: Port-a-Cath in R chest Neurological:     General: No focal deficit present.     Mental Status: She is alert and oriented to person, place, and time.  Psychiatric:        Mood and Affect: Mood normal.        Behavior: Behavior normal.     LABORATORY DATA:  I have reviewed the labs as listed.  CBC Latest Ref Rng & Units 09/14/2020 08/31/2020 08/14/2020  WBC 4.0 - 10.5 K/uL 13.7(H) 10.4 11.3(H)  Hemoglobin 12.0 - 15.0 g/dL 12.0 11.8(L) 12.4  Hematocrit 36 - 46 % 36.2 36.1 37.7  Platelets 150 - 400 K/uL 214 290 216   CMP Latest Ref Rng & Units 09/14/2020 08/31/2020 08/14/2020  Glucose 70 - 99 mg/dL 263(H) 198(H) 233(H)  BUN 6 - 20 mg/dL $Remove'11 13 16  'GWPCFih$ Creatinine 0.44 - 1.00 mg/dL 0.80 0.88 0.79  Sodium 135 - 145 mmol/L 133(L) 137 134(L)  Potassium 3.5 - 5.1 mmol/L 3.5 3.9 3.4(L)  Chloride 98 - 111 mmol/L 98 102 101  CO2 22 - 32 mmol/L $RemoveB'24 24 24  'ldbiQfZa$ Calcium 8.9 - 10.3 mg/dL 9.2 9.0 8.8(L)  Total Protein 6.5 - 8.1 g/dL 6.9 7.1 6.9  Total Bilirubin 0.3 - 1.2 mg/dL 0.4 0.5 0.3  Alkaline Phos 38 - 126 U/L 96 86 97  AST 15 - 41 U/L 16 18 13(L)  ALT 0 - 44 U/L $Remo'19 23 19    'BFBDf$ DIAGNOSTIC IMAGING:  I have independently reviewed the scans and discussed with the patient. No results found.   ASSESSMENT:  1. Triple negative left breast cancer (T2N1): -Patient identified left breast mass with left nipple retraction about 2 months ago. -Mammogram on 06/13/2020 showed mass in the retroareolar 12 o'clock position in the left breast. 2 suspicious left axillary lymph nodes, largest measuring 2  cm. -Breast ultrasound showed 2.7 x 1.6 x 2.2 cm mass in the 12:00 retroareolar position. Just lateral to the mass is a calcified hypoechoic nodule that corresponds to densely calcified degenerating fibroadenoma. -Left breast 12:00 biopsy showed invasive ductal carcinoma, grade 2/3. Left axillary lymph node biopsy was consistent with meta stasis. -ER negative, PR negative and Ki-67 15%. HER-2 equivocal by IHC. HER-2 FISH negative. -MRI of the breast on 07/19/2020 shows 4.2 cm enhancing mass in the subareolar left breast, enhancement extending to the level of the left nipple with associated nipple retraction and periareolar skin thickening. 6 morphologically abnormal level 1 left axillary lymph nodes. No evidence of malignancy in the right breast. -CT CAP on 07/14/2020 shows dominant left axillary nodes measuring 1.8 cm short axis. Small left subpectoral nodes measuring up to 7 mm short axis, suspicious. Small mediastinal lymph nodes measuring 7 to 9 mm, thought to be reactive. No other evidence of metastatic disease. -NeoadjuvantDose dense ACstarted on 08/01/2020.  2. Family history: -Father had gastroesophageal junction cancer. Maternal grandmother had throat cancer and paternal aunt had breast cancer.  3. High risk drug monitoring: -Echo on 07/12/2020 shows EF 60 to 65%.   PLAN:  1. T2N1 left breast TNBC: -She has tolerated cycle 3 reasonably well.  She developed  body pains with on pro-. -She will proceed with cycle 4 today.  I have reviewed her LFTs and CBC which are within normal limits. -We will change Neulasta on pro to fulphila. -She will come back in 2 weeks to start carboplatin and paclitaxel.  2. Family history: -[facility] genetics testing showed MSH2 VUS.  3. Hypokalemia: -Continue potassium 10 mEq daily.   Orders placed this encounter:  No orders of the defined types were placed in this encounter.    Derek Jack, MD [facility] [phone]   I, Milinda Antis, am acting as a scribe for Dr. Sanda Linger.  I, Derek Jack MD, have reviewed the above documentation for accuracy and completeness, and I agree with the above.

## 2020-09-15 ENCOUNTER — Inpatient Hospital Stay (HOSPITAL_COMMUNITY): Payer: BC Managed Care – PPO

## 2020-09-15 DIAGNOSIS — Z5111 Encounter for antineoplastic chemotherapy: Secondary | ICD-10-CM | POA: Diagnosis not present

## 2020-09-15 DIAGNOSIS — C50919 Malignant neoplasm of unspecified site of unspecified female breast: Secondary | ICD-10-CM

## 2020-09-15 DIAGNOSIS — Z95828 Presence of other vascular implants and grafts: Secondary | ICD-10-CM

## 2020-09-15 LAB — PATHOLOGIST SMEAR REVIEW

## 2020-09-15 MED ORDER — SODIUM CHLORIDE 0.9 % IV SOLN
150.0000 mg | Freq: Once | INTRAVENOUS | Status: AC
  Administered 2020-09-15: 150 mg via INTRAVENOUS
  Filled 2020-09-15: qty 150

## 2020-09-15 MED ORDER — PALONOSETRON HCL INJECTION 0.25 MG/5ML
0.2500 mg | Freq: Once | INTRAVENOUS | Status: AC
  Administered 2020-09-15: 0.25 mg via INTRAVENOUS
  Filled 2020-09-15: qty 5

## 2020-09-15 MED ORDER — SODIUM CHLORIDE 0.9 % IV SOLN
600.0000 mg/m2 | Freq: Once | INTRAVENOUS | Status: AC
  Administered 2020-09-15: 1040 mg via INTRAVENOUS
  Filled 2020-09-15: qty 52

## 2020-09-15 MED ORDER — SODIUM CHLORIDE 0.9 % IV SOLN
10.0000 mg | Freq: Once | INTRAVENOUS | Status: AC
  Administered 2020-09-15: 10 mg via INTRAVENOUS
  Filled 2020-09-15: qty 10

## 2020-09-15 MED ORDER — DOXORUBICIN HCL CHEMO IV INJECTION 2 MG/ML
60.0000 mg/m2 | Freq: Once | INTRAVENOUS | Status: AC
  Administered 2020-09-15: 104 mg via INTRAVENOUS
  Filled 2020-09-15: qty 52

## 2020-09-15 MED ORDER — HEPARIN SOD (PORK) LOCK FLUSH 100 UNIT/ML IV SOLN
500.0000 [IU] | Freq: Once | INTRAVENOUS | Status: AC | PRN
  Administered 2020-09-15: 500 [IU]

## 2020-09-15 MED ORDER — SODIUM CHLORIDE 0.9 % IV SOLN: Freq: Once | INTRAVENOUS | Status: AC

## 2020-09-15 NOTE — Progress Notes (Signed)
Tolerated tx today w/o adverse reaction.  Alert, in no distress.  VSS.  Discharged ambulatory in stable condition.

## 2020-09-18 ENCOUNTER — Inpatient Hospital Stay (HOSPITAL_COMMUNITY): Payer: BC Managed Care – PPO

## 2020-09-18 ENCOUNTER — Encounter (HOSPITAL_COMMUNITY): Payer: Self-pay

## 2020-09-18 ENCOUNTER — Other Ambulatory Visit: Payer: Self-pay

## 2020-09-18 VITALS — BP 141/75 | HR 99 | Temp 96.8°F | Resp 18

## 2020-09-18 DIAGNOSIS — C50919 Malignant neoplasm of unspecified site of unspecified female breast: Secondary | ICD-10-CM

## 2020-09-18 DIAGNOSIS — Z95828 Presence of other vascular implants and grafts: Secondary | ICD-10-CM

## 2020-09-18 DIAGNOSIS — Z5111 Encounter for antineoplastic chemotherapy: Secondary | ICD-10-CM | POA: Diagnosis not present

## 2020-09-18 MED ORDER — PEGFILGRASTIM-JMDB 6 MG/0.6ML ~~LOC~~ SOSY
6.0000 mg | PREFILLED_SYRINGE | Freq: Once | SUBCUTANEOUS | Status: AC
  Administered 2020-09-18: 6 mg via SUBCUTANEOUS

## 2020-09-18 MED ORDER — PEGFILGRASTIM-JMDB 6 MG/0.6ML ~~LOC~~ SOSY
PREFILLED_SYRINGE | SUBCUTANEOUS | Status: AC
  Filled 2020-09-18: qty 0.6

## 2020-09-18 NOTE — Patient Instructions (Signed)
[facility] Discharge Instructions  Received Fulphila injection today. Follow-up as scheduled   Thank you for choosing [facility] at [facility] to provide your oncology and hematology care.  To afford each patient quality time with our provider, please arrive at least 15 minutes before your scheduled appointment time.   If you have a lab appointment with the Cancer Center please come in thru the Main Entrance and check in at the main information desk.  You need to re-schedule your appointment should you arrive 10 or more minutes late.  We strive to give you quality time with our providers, and arriving late affects you and other patients whose appointments are after yours.  Also, if you no show three or more times for appointments you may be dismissed from the clinic at the providers discretion.     Again, thank you for choosing [facility].  Our hope is that these requests will decrease the amount of time that you wait before being seen by our physicians.       _____________________________________________________________  Should you have questions after your visit to [facility], please contact our office at [phone] and follow the prompts.  Our office hours are 8:00 a.m. and 4:30 p.m. Monday - Friday.  Please note that voicemails left after 4:00 p.m. may not be returned until the following business day.  We are closed weekends and major holidays.  You do have access to a nurse 24-7, just call the main number to the clinic [phone] and do not press any options, hold on the line and a nurse will answer the phone.    For prescription refill requests, have your pharmacy contact our office and allow 72 hours.    Due to Covid, you will need to wear a mask upon entering the hospital. If you do not have a mask, a mask will be given to you at the Main Entrance upon arrival. For doctor visits, patients may  have 1 support person age 18 or older with them. For treatment visits, patients can not have anyone with them due to social distancing guidelines and our immunocompromised population.     

## 2020-09-18 NOTE — Progress Notes (Signed)
Elizabeth Graves tolerated Fulphila injection well without complaints or incident. VSS Pt discharged self ambulatory in satisfactory condition

## 2020-09-20 ENCOUNTER — Ambulatory Visit: Payer: BC Managed Care – PPO

## 2020-09-28 ENCOUNTER — Other Ambulatory Visit (HOSPITAL_COMMUNITY): Payer: BC Managed Care – PPO

## 2020-09-28 ENCOUNTER — Ambulatory Visit (HOSPITAL_COMMUNITY): Payer: BC Managed Care – PPO | Admitting: Hematology

## 2020-09-29 ENCOUNTER — Ambulatory Visit (HOSPITAL_COMMUNITY): Payer: BC Managed Care – PPO

## 2020-10-02 ENCOUNTER — Inpatient Hospital Stay (HOSPITAL_COMMUNITY): Payer: BC Managed Care – PPO | Attending: Hematology

## 2020-10-02 ENCOUNTER — Inpatient Hospital Stay (HOSPITAL_BASED_OUTPATIENT_CLINIC_OR_DEPARTMENT_OTHER): Payer: BC Managed Care – PPO | Admitting: Hematology

## 2020-10-02 ENCOUNTER — Inpatient Hospital Stay (HOSPITAL_COMMUNITY): Payer: BC Managed Care – PPO

## 2020-10-02 ENCOUNTER — Other Ambulatory Visit: Payer: Self-pay

## 2020-10-02 ENCOUNTER — Encounter (HOSPITAL_COMMUNITY): Payer: Self-pay | Admitting: Hematology

## 2020-10-02 VITALS — BP 111/60 | HR 83 | Temp 98.3°F | Resp 18

## 2020-10-02 DIAGNOSIS — Z95828 Presence of other vascular implants and grafts: Secondary | ICD-10-CM

## 2020-10-02 DIAGNOSIS — Z8541 Personal history of malignant neoplasm of cervix uteri: Secondary | ICD-10-CM | POA: Insufficient documentation

## 2020-10-02 DIAGNOSIS — Z800 Family history of malignant neoplasm of digestive organs: Secondary | ICD-10-CM | POA: Insufficient documentation

## 2020-10-02 DIAGNOSIS — N6453 Retraction of nipple: Secondary | ICD-10-CM | POA: Insufficient documentation

## 2020-10-02 DIAGNOSIS — C50919 Malignant neoplasm of unspecified site of unspecified female breast: Secondary | ICD-10-CM

## 2020-10-02 DIAGNOSIS — C50012 Malignant neoplasm of nipple and areola, left female breast: Secondary | ICD-10-CM | POA: Diagnosis present

## 2020-10-02 DIAGNOSIS — Z171 Estrogen receptor negative status [ER-]: Secondary | ICD-10-CM | POA: Insufficient documentation

## 2020-10-02 DIAGNOSIS — Z5111 Encounter for antineoplastic chemotherapy: Secondary | ICD-10-CM | POA: Insufficient documentation

## 2020-10-02 DIAGNOSIS — Z801 Family history of malignant neoplasm of trachea, bronchus and lung: Secondary | ICD-10-CM | POA: Diagnosis not present

## 2020-10-02 DIAGNOSIS — Z803 Family history of malignant neoplasm of breast: Secondary | ICD-10-CM | POA: Diagnosis not present

## 2020-10-02 DIAGNOSIS — E876 Hypokalemia: Secondary | ICD-10-CM | POA: Diagnosis not present

## 2020-10-02 DIAGNOSIS — Z87891 Personal history of nicotine dependence: Secondary | ICD-10-CM | POA: Insufficient documentation

## 2020-10-02 LAB — CBC WITH DIFFERENTIAL/PLATELET
Abs Immature Granulocytes: 0.15 10*3/uL — ABNORMAL HIGH (ref 0.00–0.07)
Basophils Absolute: 0.1 10*3/uL (ref 0.0–0.1)
Basophils Relative: 1 %
Eosinophils Absolute: 0.2 10*3/uL (ref 0.0–0.5)
Eosinophils Relative: 2 %
HCT: 33.6 % — ABNORMAL LOW (ref 36.0–46.0)
Hemoglobin: 11 g/dL — ABNORMAL LOW (ref 12.0–15.0)
Immature Granulocytes: 2 %
Lymphocytes Relative: 10 %
Lymphs Abs: 1 10*3/uL (ref 0.7–4.0)
MCH: 28.4 pg (ref 26.0–34.0)
MCHC: 32.7 g/dL (ref 30.0–36.0)
MCV: 86.8 fL (ref 80.0–100.0)
Monocytes Absolute: 0.8 10*3/uL (ref 0.1–1.0)
Monocytes Relative: 9 %
Neutro Abs: 7.3 10*3/uL (ref 1.7–7.7)
Neutrophils Relative %: 76 %
Platelets: 306 10*3/uL (ref 150–400)
RBC: 3.87 MIL/uL (ref 3.87–5.11)
RDW: 16.7 % — ABNORMAL HIGH (ref 11.5–15.5)
WBC: 9.5 10*3/uL (ref 4.0–10.5)
nRBC: 0 % (ref 0.0–0.2)

## 2020-10-02 LAB — COMPREHENSIVE METABOLIC PANEL
ALT: 19 U/L (ref 0–44)
AST: 13 U/L — ABNORMAL LOW (ref 15–41)
Albumin: 3.7 g/dL (ref 3.5–5.0)
Alkaline Phosphatase: 80 U/L (ref 38–126)
Anion gap: 11 (ref 5–15)
BUN: 17 mg/dL (ref 6–20)
CO2: 23 mmol/L (ref 22–32)
Calcium: 9 mg/dL (ref 8.9–10.3)
Chloride: 99 mmol/L (ref 98–111)
Creatinine, Ser: 0.82 mg/dL (ref 0.44–1.00)
GFR calc Af Amer: >60 mL/min (ref >60)
GFR calc non Af Amer: >60 mL/min (ref >60)
Glucose, Bld: 240 mg/dL — ABNORMAL HIGH (ref 70–99)
Potassium: 3.6 mmol/L (ref 3.5–5.1)
Sodium: 133 mmol/L — ABNORMAL LOW (ref 135–145)
Total Bilirubin: 0.5 mg/dL (ref 0.3–1.2)
Total Protein: 6.4 g/dL — ABNORMAL LOW (ref 6.5–8.1)

## 2020-10-02 MED ORDER — PROCHLORPERAZINE MALEATE 10 MG PO TABS: 10.0000 mg | ORAL_TABLET | Freq: Four times a day (QID) | ORAL | 1 refills | Status: DC | PRN

## 2020-10-02 MED ORDER — PALONOSETRON HCL INJECTION 0.25 MG/5ML
0.2500 mg | Freq: Once | INTRAVENOUS | Status: AC
  Administered 2020-10-02: 0.25 mg via INTRAVENOUS
  Filled 2020-10-02: qty 5

## 2020-10-02 MED ORDER — SODIUM CHLORIDE 0.9 % IV SOLN
80.0000 mg/m2 | Freq: Once | INTRAVENOUS | Status: AC
  Administered 2020-10-02: 138 mg via INTRAVENOUS
  Filled 2020-10-02: qty 23

## 2020-10-02 MED ORDER — SODIUM CHLORIDE 0.9 % IV SOLN
10.0000 mg | Freq: Once | INTRAVENOUS | Status: AC
  Administered 2020-10-02: 10 mg via INTRAVENOUS
  Filled 2020-10-02: qty 10

## 2020-10-02 MED ORDER — DIPHENHYDRAMINE HCL 50 MG/ML IJ SOLN
50.0000 mg | Freq: Once | INTRAMUSCULAR | Status: AC
  Administered 2020-10-02: 50 mg via INTRAVENOUS
  Filled 2020-10-02: qty 1

## 2020-10-02 MED ORDER — SODIUM CHLORIDE 0.9 % IV SOLN
242.2000 mg | Freq: Once | INTRAVENOUS | Status: AC
  Administered 2020-10-02: 240 mg via INTRAVENOUS
  Filled 2020-10-02: qty 24

## 2020-10-02 MED ORDER — FAMOTIDINE IN NACL 20-0.9 MG/50ML-% IV SOLN
20.0000 mg | Freq: Once | INTRAVENOUS | Status: AC
  Administered 2020-10-02: 20 mg via INTRAVENOUS
  Filled 2020-10-02: qty 50

## 2020-10-02 MED ORDER — SODIUM CHLORIDE 0.9 % IV SOLN: Freq: Once | INTRAVENOUS | Status: AC

## 2020-10-02 MED ORDER — SODIUM CHLORIDE 0.9% FLUSH
10.0000 mL | INTRAVENOUS | Status: DC | PRN
  Administered 2020-10-02: 10 mL

## 2020-10-02 MED ORDER — HEPARIN SOD (PORK) LOCK FLUSH 100 UNIT/ML IV SOLN
500.0000 [IU] | Freq: Once | INTRAVENOUS | Status: AC | PRN
  Administered 2020-10-02: 500 [IU]

## 2020-10-02 NOTE — Progress Notes (Signed)
Patient presents today for treatment and follow up visit with Dr. Delton Coombes. Labs reviewed by MD. Message received to proceed with treatment today. D1 Cycle 5. Taxol, Carbo. Vital signs within parameters for treatment. Patient has no complaints of any pain today. Patient denies any significant changes since her last treatment. MAR reviewed.   Treatment given today per MD orders. Tolerated infusion without adverse affects. Vital signs stable. No complaints at this time. Discharged from clinic ambulatory in stable condition. Alert and oriented x 3. F/U with [facility] as scheduled.

## 2020-10-02 NOTE — Progress Notes (Signed)
[place], [place] [address]   CLINIC:  Medical Oncology/Hematology  PCP:  Kathyrn Drown, MD [address] [place] / [place] [address] [phone]   REASON FOR VISIT:  Follow-up for left breast cancer  PRIOR THERAPY: Adriamycin and cyclophosphamide x 4 cycles from 08/01/2020 to 09/15/2020  NGS Results: Not done  CURRENT THERAPY: Carboplatin, paclitaxel & Aloxi every 3 weeks  BRIEF ONCOLOGIC HISTORY:  Oncology History  Triple negative malignant neoplasm of breast ([place])  07/05/2020 Initial Diagnosis   Triple negative malignant neoplasm of breast ([place])   08/01/2020 -  Chemotherapy   The patient had DOXOrubicin (ADRIAMYCIN) chemo injection 104 mg, 60 mg/m2 = 104 mg, Intravenous,  Once, 4 of 4 cycles Administration: 104 mg (08/01/2020), 104 mg (08/14/2020), 104 mg (09/01/2020), 104 mg (09/15/2020) palonosetron (ALOXI) injection 0.25 mg, 0.25 mg, Intravenous,  Once, 4 of 8 cycles Administration: 0.25 mg (08/01/2020), 0.25 mg (08/14/2020), 0.25 mg (09/01/2020), 0.25 mg (09/15/2020) pegfilgrastim (NEULASTA ONPRO KIT) injection 6 mg, 6 mg, Subcutaneous, Once, 1 of 1 cycle Administration: 6 mg (09/01/2020) pegfilgrastim-jmdb (FULPHILA) injection 6 mg, 6 mg, Subcutaneous,  Once, 3 of 3 cycles Administration: 6 mg (08/03/2020), 6 mg (08/16/2020), 6 mg (09/18/2020) CARBOplatin (PARAPLATIN) 250 mg in sodium chloride 0.9 % 100 mL chemo infusion, 250 mg (original dose ), Intravenous,  Once, 0 of 4 cycles Dose modification:   (Cycle 5, Reason: Provider Judgment),   (Cycle 6, Reason: Provider Judgment) cyclophosphamide (CYTOXAN) 1,040 mg in sodium chloride 0.9 % 250 mL chemo infusion, 600 mg/m2 = 1,040 mg, Intravenous,  Once, 4 of 4 cycles Administration: 1,040 mg (08/01/2020), 1,040 mg (08/14/2020), 1,040 mg (09/01/2020), 1,040 mg (09/15/2020) PACLitaxel (TAXOL) 138 mg in sodium chloride 0.9 % 250 mL chemo infusion (</= $RemoveBefor'80mg'DHWOTIARJpaH$ /m2), 80 mg/m2, Intravenous,  Once, 0 of 4  cycles fosaprepitant (EMEND) 150 mg in sodium chloride 0.9 % 145 mL IVPB, 150 mg, Intravenous,  Once, 4 of 8 cycles Administration: 150 mg (08/01/2020), 150 mg (08/14/2020), 150 mg (09/01/2020), 150 mg (09/15/2020)  for chemotherapy treatment.    08/17/2020 Genetic Testing   Negative genetic testing:  No pathogenic variants detected on the [facility] CustomNext-Cancer + RNAinsight panel. A variant of uncertain significance (VUS) was detected in the MSH2 gene called c.1600C>T (p.R534C). The report date is 08/17/2020.  The CustomNext-Cancer+RNAinsight panel offered by [facility] includes sequencing and rearrangement analysis for up to 91 genes, which included the following 47 genes for Ms. Mergenthaler:  APC*, ATM*, AXIN2, BARD1, BMPR1A, BRCA1*, BRCA2*, BRIP1*, CDH1*, CDK4, CDKN2A, CHEK2*, DICER1, EPCAM, GREM1, HOXB13, MEN1, MLH1*, MSH2*, MSH3, MSH6*, MUTYH*, NBN, NF1*, NF2, NTHL1, PALB2*, PMS2*, POLD1, POLE, PTEN*, RAD51C*, RAD51D*, RECQL, RET, SDHA, SDHAF2, SDHB, SDHC, SDHD, SMAD4, SMARCA4, STK11, TP53*, TSC1, TSC2, and VHL.  DNA and RNA analyses performed for * genes.       CANCER STAGING: Cancer Staging No matching staging information was found for the patient.  INTERVAL HISTORY:  Ms. ROYANN WILDASIN, a 54 y.o. female, returns for routine follow-up and consideration for next cycle of chemotherapy. Charitie was last seen on 09/14/2020.  Due for cycle #5 of carboplatin, paclitaxel and Aloxi today.   Overall, she tells me she has been feeling pretty well. She tolerated the previous treatment well and only had her usual issues after getting Fulphila. She is mildly nauseous but denies vomiting. Her appetite is currently at 50% and she is eating smaller portions than her baseline.  Overall, she feels ready for next cycle  of chemo today.    REVIEW OF SYSTEMS:  Review of Systems  Constitutional: Positive for appetite change (50%) and fatigue (50%).  Gastrointestinal: Positive for nausea. Negative for vomiting.   Neurological: Positive for headaches.  All other systems reviewed and are negative.   PAST MEDICAL/SURGICAL HISTORY:  Past Medical History:  Diagnosis Date  . Asthma   . Cancer ([place])    cervical - 20 yrs ago  . Carpal tunnel syndrome   . Depression   . Family history of breast cancer   . Family history of esophageal cancer   . Family history of lung cancer   . Family history of throat cancer   . Hypertension   . Hypokalemia    tx w/ k-Dur  . Port-A-Cath in place 07/24/2020  . Seasonal allergies    tx with benadryl prn  . SVD (spontaneous vaginal delivery)    x 2   Past Surgical History:  Procedure Laterality Date  . ABDOMINAL HYSTERECTOMY    . CO2 Laser of Cervix     for cervical cancer 20 yrs ago  . DILATION AND CURETTAGE OF UTERUS  12/2010   polyp removed  . DILATION AND CURETTAGE OF UTERUS     x 3 for MAB  . LAPAROSCOPIC ASSISTED VAGINAL HYSTERECTOMY  06/09/2012   Procedure: LAPAROSCOPIC ASSISTED VAGINAL HYSTERECTOMY;  Surgeon: Gus Height, MD;  Location: [facility];  Service: Gynecology;  Laterality: N/A;  . PORTACATH PLACEMENT Right 07/17/2020   Procedure: INSERTION PORT-A-CATH;  Surgeon: Virl Cagey, MD;  Location: [facility];  Service: General;  Laterality: Right;  . SALPINGOOPHORECTOMY  06/09/2012   Procedure: SALPINGO OOPHERECTOMY;  Surgeon: Gus Height, MD;  Location: [facility];  Service: Gynecology;  Laterality: Bilateral;  . TUBAL LIGATION      SOCIAL HISTORY:  Social History   Socioeconomic History  . Marital status: Married    Spouse name: Not on file  . Number of children: 2  . Years of education: Not on file  . Highest education level: Not on file  Occupational History  . Occupation: EMPLOYED  Tobacco Use  . Smoking status: Former Smoker    Packs/day: 1.00    Years: 16.00    Pack years: 16.00    Types: Cigarettes    Start date: 05/30/1994    Quit date: 01/31/2020    Years since quitting: 0.6  . Smokeless tobacco: Never Used  Vaping Use  . Vaping  Use: Never used  Substance and Sexual Activity  . Alcohol use: No  . Drug use: No  . Sexual activity: Yes    Birth control/protection: Surgical  Other Topics Concern  . Not on file  Social History Narrative  . Not on file   Social Determinants of Health   Financial Resource Strain: Low Risk   . Difficulty of Paying Living Expenses: Not very hard  Food Insecurity: No Food Insecurity  . Worried About Charity fundraiser in the Last Year: Never true  . Ran Out of Food in the Last Year: Never true  Transportation Needs: No Transportation Needs  . Lack of Transportation (Medical): No  . Lack of Transportation (Non-Medical): No  Physical Activity: Inactive  . Days of Exercise per Week: 0 days  . Minutes of Exercise per Session: 0 min  Stress: Stress Concern Present  . Feeling of Stress : To some extent  Social Connections: Moderately Isolated  . Frequency of Communication with Friends and Family: Three times a week  . Frequency of  Social Gatherings with Friends and Family: Once a week  . Attends Religious Services: Never  . Active Member of Clubs or Organizations: No  . Attends Archivist Meetings: Never  . Marital Status: Married  Human resources officer Violence: Not At Risk  . Fear of Current or Ex-Partner: No  . Emotionally Abused: No  . Physically Abused: No  . Sexually Abused: No    FAMILY HISTORY:  Family History  Problem Relation Age of Onset  . Heart disease Mother   . Lung cancer Mother 54       smoker  . Heart disease Father   . Esophageal cancer Father        GE junction, dx. in his early 38s  . Diverticulitis Sister   . Diabetes Sister   . Diabetes Maternal Grandmother   . Heart disease Maternal Grandmother   . Throat cancer Maternal Grandmother        dx. late 35s  . Healthy Sister   . Healthy Daughter   . Healthy Son   . Breast cancer Paternal Aunt        limited info    CURRENT MEDICATIONS:  Current Outpatient Medications  Medication Sig  Dispense Refill  . albuterol (VENTOLIN HFA) 108 (90 Base) MCG/ACT inhaler TAKE 2 PUFFS BY MOUTH EVERY 6 HOURS AS NEEDED FOR WHEEZE OR SHORTNESS OF BREATH 8.5 Inhaler 0  . atorvastatin (LIPITOR) 20 MG tablet TAKE 1 TABLET BY MOUTH EVERY DAY 90 tablet 1  . CYCLOPHOSPHAMIDE IV Inject into the vein every 14 (fourteen) days.    . diphenhydrAMINE (BENADRYL) 25 mg capsule Take 25 mg by mouth every 6 (six) hours as needed for allergies. Allergies.    Marland Kitchen DOXOrubicin HCl (ADRIAMYCIN IV) Inject into the vein every 14 (fourteen) days.    Marland Kitchen esomeprazole (NEXIUM) 20 MG capsule Take 20 mg by mouth daily before breakfast.    . ibuprofen (ADVIL,MOTRIN) 200 MG tablet Take 400-600 mg by mouth every 8 (eight) hours as needed for moderate pain (pain.).     [facility] lidocaine-prilocaine (EMLA) cream Apply a pea sized amount to port a cath site and cover with plastic wrap 1 hour prior to chemotherapy appointments 30 g 3  . lisinopril (ZESTRIL) 5 MG tablet TAKE 1 TABLET BY MOUTH EVERY DAY 90 tablet 1  . naproxen sodium (ALEVE) 220 MG tablet Take 440 mg by mouth 3 (three) times daily as needed (pain.).     Marland Kitchen pegfilgrastim-jmdb (FULPHILA) 6 MG/0.6ML injection Inject 6 mg into the skin every 14 (fourteen) days.    . potassium chloride (KLOR-CON) 10 MEQ tablet Take 1 tablet (10 mEq total) by mouth daily. 30 tablet 2  . prochlorperazine (COMPAZINE) 10 MG tablet Take 1 tablet (10 mg total) by mouth every 6 (six) hours as needed (Nausea or vomiting). 30 tablet 1  . sertraline (ZOLOFT) 50 MG tablet TAKE 1 TABLET BY MOUTH EVERY DAY 90 tablet 0   No current facility-administered medications for this visit.    ALLERGIES:  No Known Allergies  PHYSICAL EXAM:  Performance status (ECOG): 0 - Asymptomatic  Vitals:   10/02/20 0937  BP: 129/75  Pulse: 99  Resp: 18  Temp: (!) 96.9 F (36.1 C)  SpO2: 97%   Wt Readings from Last 3 Encounters:  10/02/20 161 lb 1.6 oz (73.1 kg)  09/14/20 163 lb 6.4 oz (74.1 kg)  08/31/20 164 lb 0.4  oz (74.4 kg)   Physical Exam Vitals reviewed.  Constitutional:  Appearance: Normal appearance. She is obese.  Cardiovascular:     Rate and Rhythm: Normal rate and regular rhythm.     Pulses: Normal pulses.     Heart sounds: Normal heart sounds.  Pulmonary:     Effort: Pulmonary effort is normal.     Breath sounds: Normal breath sounds.  Chest:     Breasts:        Left: Inverted nipple and mass (1 cm mass above nipple) present.     Comments: Port-a-Cath in R chest Musculoskeletal:     Right lower leg: No edema.     Left lower leg: No edema.  Lymphadenopathy:     Upper Body:     Left upper body: No axillary or pectoral adenopathy.  Neurological:     General: No focal deficit present.     Mental Status: She is alert and oriented to person, place, and time.  Psychiatric:        Mood and Affect: Mood normal.        Behavior: Behavior normal.     LABORATORY DATA:  I have reviewed the labs as listed.  CBC Latest Ref Rng & Units 10/02/2020 09/14/2020 08/31/2020  WBC 4.0 - 10.5 K/uL 9.5 13.7(H) 10.4  Hemoglobin 12.0 - 15.0 g/dL 11.0(L) 12.0 11.8(L)  Hematocrit 36 - 46 % 33.6(L) 36.2 36.1  Platelets 150 - 400 K/uL 306 214 290   CMP Latest Ref Rng & Units 09/14/2020 08/31/2020 08/14/2020  Glucose 70 - 99 mg/dL 263(H) 198(H) 233(H)  BUN 6 - 20 mg/dL $Remove'11 13 16  'qVIRkHI$ Creatinine 0.44 - 1.00 mg/dL 0.80 0.88 0.79  Sodium 135 - 145 mmol/L 133(L) 137 134(L)  Potassium 3.5 - 5.1 mmol/L 3.5 3.9 3.4(L)  Chloride 98 - 111 mmol/L 98 102 101  CO2 22 - 32 mmol/L $RemoveB'24 24 24  'guhzaQqj$ Calcium 8.9 - 10.3 mg/dL 9.2 9.0 8.8(L)  Total Protein 6.5 - 8.1 g/dL 6.9 7.1 6.9  Total Bilirubin 0.3 - 1.2 mg/dL 0.4 0.5 0.3  Alkaline Phos 38 - 126 U/L 96 86 97  AST 15 - 41 U/L 16 18 13(L)  ALT 0 - 44 U/L $Remo'19 23 19    'ghFmW$ DIAGNOSTIC IMAGING:  I have independently reviewed the scans and discussed with the patient. No results found.   ASSESSMENT:  1. Triple negative left breast cancer (T2N1): -Patient identified left breast  mass with left nipple retraction about 2 months ago. -Mammogram on 06/13/2020 showed mass in the retroareolar 12 o'clock position in the left breast. 2 suspicious left axillary lymph nodes, largest measuring 2 cm. -Breast ultrasound showed 2.7 x 1.6 x 2.2 cm mass in the 12:00 retroareolar position. Just lateral to the mass is a calcified hypoechoic nodule that corresponds to densely calcified degenerating fibroadenoma. -Left breast 12:00 biopsy showed invasive ductal carcinoma, grade 2/3. Left axillary lymph node biopsy was consistent with meta stasis. -ER negative, PR negative and Ki-67 15%. HER-2 equivocal by IHC. HER-2 FISH negative. -MRI of the breast on 07/19/2020 shows 4.2 cm enhancing mass in the subareolar left breast, enhancement extending to the level of the left nipple with associated nipple retraction and periareolar skin thickening. 6 morphologically abnormal level 1 left axillary lymph nodes. No evidence of malignancy in the right breast. -CT CAP on 07/14/2020 shows dominant left axillary nodes measuring 1.8 cm short axis. Small left subpectoral nodes measuring up to 7 mm short axis, suspicious. Small mediastinal lymph nodes measuring 7 to 9 mm, thought to be reactive. No other evidence of  metastatic disease. -NeoadjuvantDose dense ACstarted on 08/01/2020.  2. Family history: -Father had gastroesophageal junction cancer. Maternal grandmother had throat cancer and paternal aunt had breast cancer.  3. High risk drug monitoring: -Echo on 07/12/2020 shows EF 60 to 65%.   PLAN:  1. T2N1 left breast TNBC: -She has completed 4 cycles of AC without any major problems. -We have reviewed her labs today which showed normal LFTs.  Sugar was 240.  White count was normal. -I have discussed starting her on weekly paclitaxel.  I will add carboplatin AUC 2 weekly for better tolerability.  We discussed the side effects in detail. -She will continue weekly Taxol and carboplatin.  I will  see her back in 2 weeks for follow-up.  2. Family history: -[facility] genetics testing showed MSH2 VUS.  3. Hypokalemia: -Continue potassium 10 mEq daily.  Potassium today is 3.6.   Orders placed this encounter:  No orders of the defined types were placed in this encounter.    Derek Jack, MD [place] [phone]   I, Milinda Antis, am acting as a scribe for Dr. Sanda Linger.  I, Derek Jack MD, have reviewed the above documentation for accuracy and completeness, and I agree with the above.

## 2020-10-02 NOTE — Progress Notes (Signed)
Patient was assessed by Dr. Katragadda and labs have been reviewed.  Patient is okay to proceed with treatment today. Primary RN and pharmacy aware.   

## 2020-10-02 NOTE — Patient Instructions (Signed)
[place] at [facility] Discharge Instructions  You were seen today by Dr. Delton Coombes. He went over your recent results. Your chemotherapy has been switched to weekly low-dose carboplatin and paclitaxel (Taxol). Possible side effects to expect include fatigue, mild nausea, and numbness and/or tingling in hands and feet. Dr. Delton Coombes will see you back in 2 weeks for labs and follow up.   Thank you for choosing [place] at [facility] to provide your oncology and hematology care.  To afford each patient quality time with our provider, please arrive at least 15 minutes before your scheduled appointment time.   If you have a lab appointment with the [facility] [place] please come in thru the Main Entrance and check in at the main information desk  You need to re-schedule your appointment should you arrive 10 or more minutes late.  We strive to give you quality time with our providers, and arriving late affects you and other patients whose appointments are after yours.  Also, if you no show three or more times for appointments you may be dismissed from the clinic at the providers discretion.     Again, thank you for choosing [facility].  Our hope is that these requests will decrease the amount of time that you wait before being seen by our physicians.       _____________________________________________________________  Should you have questions after your visit to [facility], please contact our office at [phone] between the hours of 8:00 a.m. and 4:30 p.m.  Voicemails left after 4:00 p.m. will not be returned until the following business day.  For prescription refill requests, have your pharmacy contact our office and allow 72 hours.    Cancer Center Support Programs:   > Cancer Support Group  2nd Tuesday of the month 1pm-2pm, Journey Room

## 2020-10-02 NOTE — Patient Instructions (Signed)
[facility] at [facility]  Discharge Instructions:   _______________________________________________________________  Thank you for choosing [facility] at [facility] to provide your oncology and hematology care.  To afford each patient quality time with our providers, please arrive at least 15 minutes before your scheduled appointment.  You need to re-schedule your appointment if you arrive 10 or more minutes late.  We strive to give you quality time with our providers, and arriving late affects you and other patients whose appointments are after yours.  Also, if you no show three or more times for appointments you may be dismissed from the clinic.  Again, thank you for choosing [facility] at [facility]. Our hope is that these requests will allow you access to exceptional care and in a timely manner. _______________________________________________________________  If you have questions after your visit, please contact our office at [phone] between the hours of 8:30 a.m. and 5:00 p.m. Voicemails left after 4:30 p.m. will not be returned until the following business day. _______________________________________________________________  For prescription refill requests, have your pharmacy contact our office. _______________________________________________________________  Recommendations made by the consultant and any test results will be sent to your referring physician. _______________________________________________________________ 

## 2020-10-02 NOTE — Patient Instructions (Signed)
[facility] at [facility] Discharge Instructions  Labs drawn from portacath today   Thank you for choosing [facility] at [facility] to provide your oncology and hematology care.  To afford each patient quality time with our provider, please arrive at least 15 minutes before your scheduled appointment time.   If you have a lab appointment with the Cancer Center please come in thru the Main Entrance and check in at the main information desk.  You need to re-schedule your appointment should you arrive 10 or more minutes late.  We strive to give you quality time with our providers, and arriving late affects you and other patients whose appointments are after yours.  Also, if you no show three or more times for appointments you may be dismissed from the clinic at the providers discretion.     Again, thank you for choosing [facility].  Our hope is that these requests will decrease the amount of time that you wait before being seen by our physicians.       _____________________________________________________________  Should you have questions after your visit to [facility], please contact our office at [phone] and follow the prompts.  Our office hours are 8:00 a.m. and 4:30 p.m. Monday - Friday.  Please note that voicemails left after 4:00 p.m. may not be returned until the following business day.  We are closed weekends and major holidays.  You do have access to a nurse 24-7, just call the main number to the clinic [phone] and do not press any options, hold on the line and a nurse will answer the phone.    For prescription refill requests, have your pharmacy contact our office and allow 72 hours.    Due to Covid, you will need to wear a mask upon entering the hospital. If you do not have a mask, a mask will be given to you at the Main Entrance upon arrival. For doctor visits, patients may have 1 support person age 18  or older with them. For treatment visits, patients can not have anyone with them due to social distancing guidelines and our immunocompromised population.     

## 2020-10-03 NOTE — Progress Notes (Signed)
24 hour call back post Carboplatin/ Taxol. Patient states she became nauseas at bedtime and took Compazine at home and has had no other problems. Patient states she is tired. Patient teaching performed.

## 2020-10-09 ENCOUNTER — Ambulatory Visit (HOSPITAL_COMMUNITY): Payer: BC Managed Care – PPO | Admitting: Hematology

## 2020-10-09 ENCOUNTER — Other Ambulatory Visit: Payer: Self-pay

## 2020-10-09 ENCOUNTER — Other Ambulatory Visit (HOSPITAL_COMMUNITY): Payer: BC Managed Care – PPO

## 2020-10-09 ENCOUNTER — Inpatient Hospital Stay (HOSPITAL_COMMUNITY): Payer: BC Managed Care – PPO

## 2020-10-09 ENCOUNTER — Ambulatory Visit (HOSPITAL_COMMUNITY): Payer: BC Managed Care – PPO

## 2020-10-09 ENCOUNTER — Encounter (HOSPITAL_COMMUNITY): Payer: Self-pay

## 2020-10-09 VITALS — BP 115/69 | HR 87 | Temp 98.0°F | Resp 17

## 2020-10-09 DIAGNOSIS — Z5111 Encounter for antineoplastic chemotherapy: Secondary | ICD-10-CM | POA: Diagnosis not present

## 2020-10-09 DIAGNOSIS — C50919 Malignant neoplasm of unspecified site of unspecified female breast: Secondary | ICD-10-CM

## 2020-10-09 DIAGNOSIS — Z95828 Presence of other vascular implants and grafts: Secondary | ICD-10-CM

## 2020-10-09 LAB — COMPREHENSIVE METABOLIC PANEL
ALT: 21 U/L (ref 0–44)
AST: 14 U/L — ABNORMAL LOW (ref 15–41)
Albumin: 3.8 g/dL (ref 3.5–5.0)
Alkaline Phosphatase: 74 U/L (ref 38–126)
Anion gap: 8 (ref 5–15)
BUN: 18 mg/dL (ref 6–20)
CO2: 25 mmol/L (ref 22–32)
Calcium: 9.3 mg/dL (ref 8.9–10.3)
Chloride: 102 mmol/L (ref 98–111)
Creatinine, Ser: 0.77 mg/dL (ref 0.44–1.00)
GFR, Estimated: >60 mL/min (ref >60)
Glucose, Bld: 165 mg/dL — ABNORMAL HIGH (ref 70–99)
Potassium: 3.8 mmol/L (ref 3.5–5.1)
Sodium: 135 mmol/L (ref 135–145)
Total Bilirubin: 0.4 mg/dL (ref 0.3–1.2)
Total Protein: 6.8 g/dL (ref 6.5–8.1)

## 2020-10-09 LAB — CBC WITH DIFFERENTIAL/PLATELET
Abs Immature Granulocytes: 0.05 10*3/uL (ref 0.00–0.07)
Basophils Absolute: 0.1 10*3/uL (ref 0.0–0.1)
Basophils Relative: 1 %
Eosinophils Absolute: 0.3 10*3/uL (ref 0.0–0.5)
Eosinophils Relative: 4 %
HCT: 32.7 % — ABNORMAL LOW (ref 36.0–46.0)
Hemoglobin: 10.8 g/dL — ABNORMAL LOW (ref 12.0–15.0)
Immature Granulocytes: 1 %
Lymphocytes Relative: 22 %
Lymphs Abs: 1.3 10*3/uL (ref 0.7–4.0)
MCH: 28.6 pg (ref 26.0–34.0)
MCHC: 33 g/dL (ref 30.0–36.0)
MCV: 86.5 fL (ref 80.0–100.0)
Monocytes Absolute: 0.6 10*3/uL (ref 0.1–1.0)
Monocytes Relative: 9 %
Neutro Abs: 4 10*3/uL (ref 1.7–7.7)
Neutrophils Relative %: 63 %
Platelets: 361 10*3/uL (ref 150–400)
RBC: 3.78 MIL/uL — ABNORMAL LOW (ref 3.87–5.11)
RDW: 16 % — ABNORMAL HIGH (ref 11.5–15.5)
WBC: 6.2 10*3/uL (ref 4.0–10.5)
nRBC: 0 % (ref 0.0–0.2)

## 2020-10-09 MED ORDER — FAMOTIDINE IN NACL 20-0.9 MG/50ML-% IV SOLN
INTRAVENOUS | Status: AC
  Filled 2020-10-09: qty 50

## 2020-10-09 MED ORDER — HEPARIN SOD (PORK) LOCK FLUSH 100 UNIT/ML IV SOLN
500.0000 [IU] | Freq: Once | INTRAVENOUS | Status: AC | PRN
  Administered 2020-10-09: 500 [IU]

## 2020-10-09 MED ORDER — SODIUM CHLORIDE 0.9 % IV SOLN
20.0000 mg | Freq: Once | INTRAVENOUS | Status: AC
  Administered 2020-10-09: 20 mg via INTRAVENOUS
  Filled 2020-10-09: qty 20

## 2020-10-09 MED ORDER — SODIUM CHLORIDE 0.9% FLUSH
10.0000 mL | INTRAVENOUS | Status: DC | PRN
  Administered 2020-10-09: 10 mL

## 2020-10-09 MED ORDER — DIPHENHYDRAMINE HCL 50 MG/ML IJ SOLN
50.0000 mg | Freq: Once | INTRAMUSCULAR | Status: AC
  Administered 2020-10-09: 50 mg via INTRAVENOUS

## 2020-10-09 MED ORDER — SODIUM CHLORIDE 0.9 % IV SOLN: Freq: Once | INTRAVENOUS | Status: AC

## 2020-10-09 MED ORDER — SODIUM CHLORIDE 0.9 % IV SOLN
240.0000 mg | Freq: Once | INTRAVENOUS | Status: AC
  Administered 2020-10-09: 240 mg via INTRAVENOUS
  Filled 2020-10-09: qty 24

## 2020-10-09 MED ORDER — FAMOTIDINE IN NACL 20-0.9 MG/50ML-% IV SOLN
20.0000 mg | Freq: Once | INTRAVENOUS | Status: AC
  Administered 2020-10-09: 20 mg via INTRAVENOUS

## 2020-10-09 MED ORDER — DIPHENHYDRAMINE HCL 50 MG/ML IJ SOLN
INTRAMUSCULAR | Status: AC
  Filled 2020-10-09: qty 1

## 2020-10-09 MED ORDER — SODIUM CHLORIDE 0.9 % IV SOLN
80.0000 mg/m2 | Freq: Once | INTRAVENOUS | Status: AC
  Administered 2020-10-09: 138 mg via INTRAVENOUS
  Filled 2020-10-09: qty 23

## 2020-10-09 NOTE — Patient Instructions (Signed)
[facility] at [facility] Discharge Instructions  Labs drawn from portacath today   Thank you for choosing [facility] at [facility] to provide your oncology and hematology care.  To afford each patient quality time with our provider, please arrive at least 15 minutes before your scheduled appointment time.   If you have a lab appointment with the Cancer Center please come in thru the Main Entrance and check in at the main information desk.  You need to re-schedule your appointment should you arrive 10 or more minutes late.  We strive to give you quality time with our providers, and arriving late affects you and other patients whose appointments are after yours.  Also, if you no show three or more times for appointments you may be dismissed from the clinic at the providers discretion.     Again, thank you for choosing [facility].  Our hope is that these requests will decrease the amount of time that you wait before being seen by our physicians.       _____________________________________________________________  Should you have questions after your visit to  Cancer Center, please contact our office at [phone] and follow the prompts.  Our office hours are 8:00 a.m. and 4:30 p.m. Monday - Friday.  Please note that voicemails left after 4:00 p.m. may not be returned until the following business day.  We are closed weekends and major holidays.  You do have access to a nurse 24-7, just call the main number to the clinic [phone] and do not press any options, hold on the line and a nurse will answer the phone.    For prescription refill requests, have your pharmacy contact our office and allow 72 hours.    Due to Covid, you will need to wear a mask upon entering the hospital. If you do not have a mask, a mask will be given to you at the Main Entrance upon arrival. For doctor visits, patients may have 1 support person age 18  or older with them. For treatment visits, patients can not have anyone with them due to social distancing guidelines and our immunocompromised population.     

## 2020-10-09 NOTE — Progress Notes (Signed)
Patient presents today for treatment. Labs within parameters for treatment. Vital signs within parameters for treatment.   Treatment given today per MD orders. Tolerated infusion without adverse affects. Vital signs stable. No complaints at this time. Discharged from clinic ambulatory in stable condition. Alert and oriented x 3. F/U with [facility] as scheduled.

## 2020-10-09 NOTE — Patient Instructions (Signed)
[facility] Discharge Instructions for Patients Receiving Chemotherapy  Today you received the following chemotherapy agents   To help prevent nausea and vomiting after your treatment, we encourage you to take your nausea medication   If you develop nausea and vomiting that is not controlled by your nausea medication, call the clinic.   BELOW ARE SYMPTOMS THAT SHOULD BE REPORTED IMMEDIATELY:  *FEVER GREATER THAN 100.5 F  *CHILLS WITH OR WITHOUT FEVER  NAUSEA AND VOMITING THAT IS NOT CONTROLLED WITH YOUR NAUSEA MEDICATION  *UNUSUAL SHORTNESS OF BREATH  *UNUSUAL BRUISING OR BLEEDING  TENDERNESS IN MOUTH AND THROAT WITH OR WITHOUT PRESENCE OF ULCERS  *URINARY PROBLEMS  *BOWEL PROBLEMS  UNUSUAL RASH Items with * indicate a potential emergency and should be followed up as soon as possible.  Feel free to call the clinic should you have any questions or concerns. The clinic phone number is [phone].  Please show the CHEMO ALERT CARD at check-in to the Emergency Department and triage nurse.   

## 2020-10-16 ENCOUNTER — Inpatient Hospital Stay (HOSPITAL_COMMUNITY): Payer: BC Managed Care – PPO

## 2020-10-16 ENCOUNTER — Other Ambulatory Visit: Payer: Self-pay

## 2020-10-16 ENCOUNTER — Inpatient Hospital Stay (HOSPITAL_BASED_OUTPATIENT_CLINIC_OR_DEPARTMENT_OTHER): Payer: BC Managed Care – PPO | Admitting: Hematology

## 2020-10-16 VITALS — BP 140/77 | HR 98 | Temp 97.0°F | Resp 18 | Wt 161.6 lb

## 2020-10-16 VITALS — BP 119/68 | HR 110 | Temp 98.2°F | Resp 17

## 2020-10-16 DIAGNOSIS — Z95828 Presence of other vascular implants and grafts: Secondary | ICD-10-CM

## 2020-10-16 DIAGNOSIS — C50919 Malignant neoplasm of unspecified site of unspecified female breast: Secondary | ICD-10-CM

## 2020-10-16 DIAGNOSIS — Z5111 Encounter for antineoplastic chemotherapy: Secondary | ICD-10-CM | POA: Diagnosis not present

## 2020-10-16 LAB — COMPREHENSIVE METABOLIC PANEL
ALT: 25 U/L (ref 0–44)
AST: 15 U/L (ref 15–41)
Albumin: 3.7 g/dL (ref 3.5–5.0)
Alkaline Phosphatase: 67 U/L (ref 38–126)
Anion gap: 10 (ref 5–15)
BUN: 15 mg/dL (ref 6–20)
CO2: 25 mmol/L (ref 22–32)
Calcium: 9 mg/dL (ref 8.9–10.3)
Chloride: 99 mmol/L (ref 98–111)
Creatinine, Ser: 0.96 mg/dL (ref 0.44–1.00)
GFR, Estimated: >60 mL/min (ref >60)
Glucose, Bld: 194 mg/dL — ABNORMAL HIGH (ref 70–99)
Potassium: 3.8 mmol/L (ref 3.5–5.1)
Sodium: 134 mmol/L — ABNORMAL LOW (ref 135–145)
Total Bilirubin: 0.4 mg/dL (ref 0.3–1.2)
Total Protein: 6.6 g/dL (ref 6.5–8.1)

## 2020-10-16 LAB — CBC WITH DIFFERENTIAL/PLATELET
Abs Immature Granulocytes: 0.04 10*3/uL (ref 0.00–0.07)
Basophils Absolute: 0.1 10*3/uL (ref 0.0–0.1)
Basophils Relative: 1 %
Eosinophils Absolute: 0.2 10*3/uL (ref 0.0–0.5)
Eosinophils Relative: 5 %
HCT: 30.6 % — ABNORMAL LOW (ref 36.0–46.0)
Hemoglobin: 10.3 g/dL — ABNORMAL LOW (ref 12.0–15.0)
Immature Granulocytes: 1 %
Lymphocytes Relative: 25 %
Lymphs Abs: 1.2 10*3/uL (ref 0.7–4.0)
MCH: 28.7 pg (ref 26.0–34.0)
MCHC: 33.7 g/dL (ref 30.0–36.0)
MCV: 85.2 fL (ref 80.0–100.0)
Monocytes Absolute: 0.4 10*3/uL (ref 0.1–1.0)
Monocytes Relative: 8 %
Neutro Abs: 3 10*3/uL (ref 1.7–7.7)
Neutrophils Relative %: 60 %
Platelets: 266 10*3/uL (ref 150–400)
RBC: 3.59 MIL/uL — ABNORMAL LOW (ref 3.87–5.11)
RDW: 16.5 % — ABNORMAL HIGH (ref 11.5–15.5)
WBC: 4.9 10*3/uL (ref 4.0–10.5)
nRBC: 0 % (ref 0.0–0.2)

## 2020-10-16 MED ORDER — FAMOTIDINE IN NACL 20-0.9 MG/50ML-% IV SOLN
20.0000 mg | Freq: Once | INTRAVENOUS | Status: AC
  Administered 2020-10-16: 20 mg via INTRAVENOUS
  Filled 2020-10-16: qty 50

## 2020-10-16 MED ORDER — SODIUM CHLORIDE 0.9 % IV SOLN
20.0000 mg | Freq: Once | INTRAVENOUS | Status: AC
  Administered 2020-10-16: 20 mg via INTRAVENOUS
  Filled 2020-10-16: qty 20

## 2020-10-16 MED ORDER — SODIUM CHLORIDE 0.9 % IV SOLN
214.2000 mg | Freq: Once | INTRAVENOUS | Status: AC
  Administered 2020-10-16: 210 mg via INTRAVENOUS
  Filled 2020-10-16: qty 21

## 2020-10-16 MED ORDER — SODIUM CHLORIDE 0.9 % IV SOLN: Freq: Once | INTRAVENOUS | Status: AC

## 2020-10-16 MED ORDER — POTASSIUM CHLORIDE ER 10 MEQ PO TBCR: 10.0000 meq | EXTENDED_RELEASE_TABLET | Freq: Every day | ORAL | 2 refills | Status: DC

## 2020-10-16 MED ORDER — SODIUM CHLORIDE 0.9% FLUSH
10.0000 mL | INTRAVENOUS | Status: DC | PRN
  Administered 2020-10-16: 10 mL

## 2020-10-16 MED ORDER — SODIUM CHLORIDE 0.9 % IV SOLN
80.0000 mg/m2 | Freq: Once | INTRAVENOUS | Status: AC
  Administered 2020-10-16: 138 mg via INTRAVENOUS
  Filled 2020-10-16: qty 23

## 2020-10-16 MED ORDER — HEPARIN SOD (PORK) LOCK FLUSH 100 UNIT/ML IV SOLN
500.0000 [IU] | Freq: Once | INTRAVENOUS | Status: AC | PRN
  Administered 2020-10-16: 500 [IU]

## 2020-10-16 MED ORDER — DIPHENHYDRAMINE HCL 50 MG/ML IJ SOLN
50.0000 mg | Freq: Once | INTRAMUSCULAR | Status: AC
  Administered 2020-10-16: 50 mg via INTRAVENOUS
  Filled 2020-10-16: qty 1

## 2020-10-16 NOTE — Patient Instructions (Signed)
[place] at [facility] Discharge Instructions  You were seen today by Dr. Delton Coombes. He went over your recent results. You received your treatment today; continue getting your weekly treatment. Dr. Delton Coombes will see you back in 2 weeks for labs and follow up.   Thank you for choosing [place] at [facility] to provide your oncology and hematology care.  To afford each patient quality time with our provider, please arrive at least 15 minutes before your scheduled appointment time.   If you have a lab appointment with the [place] please come in thru the Main Entrance and check in at the main information desk  You need to re-schedule your appointment should you arrive 10 or more minutes late.  We strive to give you quality time with our providers, and arriving late affects you and other patients whose appointments are after yours.  Also, if you no show three or more times for appointments you may be dismissed from the clinic at the providers discretion.     Again, thank you for choosing [facility].  Our hope is that these requests will decrease the amount of time that you wait before being seen by our physicians.       _____________________________________________________________  Should you have questions after your visit to [facility], please contact our office at [phone] between the hours of 8:00 a.m. and 4:30 p.m.  Voicemails left after 4:00 p.m. will not be returned until the following business day.  For prescription refill requests, have your pharmacy contact our office and allow 72 hours.    Cancer Center Support Programs:   > Cancer Support Group  2nd Tuesday of the month 1pm-2pm, Journey Room

## 2020-10-16 NOTE — Progress Notes (Signed)
Pt here for C5D15 taxol/carbo.  Pt does not have any complaints today other than fatigue that is relieved by rest.  Labs are WNL for treatment.  Okay for treatment.   Tolerated treatment well today.  Discharged ambulatory in stable condition.  Vital signs stable prior to discharge.

## 2020-10-16 NOTE — Patient Instructions (Signed)

## 2020-10-16 NOTE — Progress Notes (Signed)
Patient was assessed by Dr. Katragadda and labs have been reviewed.  Patient is okay to proceed with treatment today. Primary RN and pharmacy aware.   

## 2020-10-16 NOTE — Progress Notes (Signed)
[facility] [address], [place] [address]   CLINIC:  Medical Oncology/Hematology  PCP:  Babs Sciara, MD [id number] / [place] [address] [phone]   REASON FOR VISIT:  Follow-up for left breast cancer  PRIOR THERAPY: Adriamycin and cyclophosphamide x 4 cycles from 08/01/2020 to 09/15/2020  NGS Results: Not done  CURRENT THERAPY: Carboplatin, paclitaxel & Aloxi weekly  BRIEF ONCOLOGIC HISTORY:  Oncology History  Triple negative malignant neoplasm of breast (HCC)  07/05/2020 Initial Diagnosis   Triple negative malignant neoplasm of breast (HCC)   08/01/2020 -  Chemotherapy   The patient had DOXOrubicin (ADRIAMYCIN) chemo injection 104 mg, 60 mg/m2 = 104 mg, Intravenous,  Once, 4 of 4 cycles Administration: 104 mg (08/01/2020), 104 mg (08/14/2020), 104 mg (09/01/2020), 104 mg (09/15/2020) palonosetron (ALOXI) injection 0.25 mg, 0.25 mg, Intravenous,  Once, 5 of 8 cycles Administration: 0.25 mg (08/01/2020), 0.25 mg (10/02/2020), 0.25 mg (08/14/2020), 0.25 mg (09/01/2020), 0.25 mg (09/15/2020) pegfilgrastim (NEULASTA ONPRO KIT) injection 6 mg, 6 mg, Subcutaneous, Once, 1 of 1 cycle Administration: 6 mg (09/01/2020) pegfilgrastim-jmdb (FULPHILA) injection 6 mg, 6 mg, Subcutaneous,  Once, 3 of 3 cycles Administration: 6 mg (08/03/2020), 6 mg (08/16/2020), 6 mg (09/18/2020) CARBOplatin (PARAPLATIN) 240 mg in sodium chloride 0.9 % 250 mL chemo infusion, 240 mg (98.1 % of original dose 247 mg), Intravenous,  Once, 1 of 4 cycles Dose modification:   (original dose 247 mg, Cycle 5, Reason: Provider Judgment),   (original dose 247 mg, Cycle 6, Reason: Provider Judgment) Administration: 240 mg (10/02/2020), 240 mg (10/09/2020) cyclophosphamide (CYTOXAN) 1,040 mg in sodium chloride 0.9 % 250 mL chemo infusion, 600 mg/m2 = 1,040 mg, Intravenous,  Once, 4 of 4 cycles Administration: 1,040 mg (08/01/2020), 1,040 mg (08/14/2020), 1,040 mg (09/01/2020), 1,040 mg  (09/15/2020) PACLitaxel (TAXOL) 138 mg in sodium chloride 0.9 % 250 mL chemo infusion (</= 80mg /m2), 80 mg/m2 = 138 mg, Intravenous,  Once, 1 of 4 cycles Administration: 138 mg (10/02/2020), 138 mg (10/09/2020) fosaprepitant (EMEND) 150 mg in sodium chloride 0.9 % 145 mL IVPB, 150 mg, Intravenous,  Once, 4 of 4 cycles Administration: 150 mg (08/01/2020), 150 mg (08/14/2020), 150 mg (09/01/2020), 150 mg (09/15/2020)  for chemotherapy treatment.    08/17/2020 Genetic Testing   Negative genetic testing:  No pathogenic variants detected on the [facility] CustomNext-Cancer + RNAinsight panel. A variant of uncertain significance (VUS) was detected in the MSH2 gene called c.1600C>T (p.R534C). The report date is 08/17/2020.  The CustomNext-Cancer+RNAinsight panel offered by 08/19/2020 includes sequencing and rearrangement analysis for up to 91 genes, which included the following 47 genes for Elizabeth Graves:  APC*, ATM*, AXIN2, BARD1, BMPR1A, BRCA1*, BRCA2*, BRIP1*, CDH1*, CDK4, CDKN2A, CHEK2*, DICER1, EPCAM, GREM1, HOXB13, MEN1, MLH1*, MSH2*, MSH3, MSH6*, MUTYH*, NBN, NF1*, NF2, NTHL1, PALB2*, PMS2*, POLD1, POLE, PTEN*, RAD51C*, RAD51D*, RECQL, RET, SDHA, SDHAF2, SDHB, SDHC, SDHD, SMAD4, SMARCA4, STK11, TP53*, TSC1, TSC2, and VHL.  DNA and RNA analyses performed for * genes.       CANCER STAGING: Cancer Staging No matching staging information was found for the patient.  INTERVAL HISTORY:  Elizabeth Graves, a 54 y.o. female, returns for routine follow-up and consideration for next cycle of chemotherapy. Elizabeth Graves was last seen on 10/02/2020.  Due for day #15 of cycle #5 of carboplatin and paclitaxel today.   Overall, she tells me she has been feeling good. She reports feeling more tired with this regimen than the previous one. She denies  N/V/D. She denies having any numbness or tingling.  Overall, she feels ready for next cycle of chemo today.    REVIEW OF SYSTEMS:  Review of Systems  Constitutional: Positive  for appetite change (75%) and fatigue (50%).  Respiratory: Positive for shortness of breath (w/ exertion).   Gastrointestinal: Negative for diarrhea, nausea and vomiting.  Neurological: Positive for headaches. Negative for numbness.  All other systems reviewed and are negative.   PAST MEDICAL/SURGICAL HISTORY:  Past Medical History:  Diagnosis Date  . Asthma   . Cancer (HCC)    cervical - 20 yrs ago  . Carpal tunnel syndrome   . Depression   . Family history of breast cancer   . Family history of esophageal cancer   . Family history of lung cancer   . Family history of throat cancer   . Hypertension   . Hypokalemia    tx w/ k-Dur  . Port-A-Cath in place 07/24/2020  . Seasonal allergies    tx with benadryl prn  . SVD (spontaneous vaginal delivery)    x 2   Past Surgical History:  Procedure Laterality Date  . ABDOMINAL HYSTERECTOMY    . CO2 Laser of Cervix     for cervical cancer 20 yrs ago  . DILATION AND CURETTAGE OF UTERUS  12/2010   polyp removed  . DILATION AND CURETTAGE OF UTERUS     x 3 for MAB  . LAPAROSCOPIC ASSISTED VAGINAL HYSTERECTOMY  06/09/2012   Procedure: LAPAROSCOPIC ASSISTED VAGINAL HYSTERECTOMY;  Surgeon: Miguel Aschoff, MD;  Location: [facility];  Service: Gynecology;  Laterality: N/A;  . PORTACATH PLACEMENT Right 07/17/2020   Procedure: INSERTION PORT-A-CATH;  Surgeon: Lucretia Roers, MD;  Location: [facility];  Service: General;  Laterality: Right;  . SALPINGOOPHORECTOMY  06/09/2012   Procedure: SALPINGO OOPHERECTOMY;  Surgeon: Miguel Aschoff, MD;  Location: [facility];  Service: Gynecology;  Laterality: Bilateral;  . TUBAL LIGATION      SOCIAL HISTORY:  Social History   Socioeconomic History  . Marital status: Married    Spouse name: Not on file  . Number of children: 2  . Years of education: Not on file  . Highest education level: Not on file  Occupational History  . Occupation: EMPLOYED  Tobacco Use  . Smoking status: Former Smoker    Packs/day: 1.00     Years: 16.00    Pack years: 16.00    Types: Cigarettes    Start date: 05/30/1994    Quit date: 01/31/2020    Years since quitting: 0.7  . Smokeless tobacco: Never Used  Vaping Use  . Vaping Use: Never used  Substance and Sexual Activity  . Alcohol use: No  . Drug use: No  . Sexual activity: Yes    Birth control/protection: Surgical  Other Topics Concern  . Not on file  Social History Narrative  . Not on file   Social Determinants of Health   Financial Resource Strain: Low Risk   . Difficulty of Paying Living Expenses: Not very hard  Food Insecurity: No Food Insecurity  . Worried About Programme researcher, broadcasting/film/video in the Last Year: Never true  . Ran Out of Food in the Last Year: Never true  Transportation Needs: No Transportation Needs  . Lack of Transportation (Medical): No  . Lack of Transportation (Non-Medical): No  Physical Activity: Inactive  . Days of Exercise per Week: 0 days  . Minutes of Exercise per Session: 0 min  Stress: Stress Concern Present  .  Feeling of Stress : To some extent  Social Connections: Moderately Isolated  . Frequency of Communication with Friends and Family: Three times a week  . Frequency of Social Gatherings with Friends and Family: Once a week  . Attends Religious Services: Never  . Active Member of Clubs or Organizations: No  . Attends Archivist Meetings: Never  . Marital Status: Married  Human resources officer Violence: Not At Risk  . Fear of Current or Ex-Partner: No  . Emotionally Abused: No  . Physically Abused: No  . Sexually Abused: No    FAMILY HISTORY:  Family History  Problem Relation Age of Onset  . Heart disease Mother   . Lung cancer Mother 70       smoker  . Heart disease Father   . Esophageal cancer Father        GE junction, dx. in his early 68s  . Diverticulitis Sister   . Diabetes Sister   . Diabetes Maternal Grandmother   . Heart disease Maternal Grandmother   . Throat cancer Maternal Grandmother        dx. late  50s  . Healthy Sister   . Healthy Daughter   . Healthy Son   . Breast cancer Paternal Aunt        limited info    CURRENT MEDICATIONS:  Current Outpatient Medications  Medication Sig Dispense Refill  . albuterol (VENTOLIN HFA) 108 (90 Base) MCG/ACT inhaler TAKE 2 PUFFS BY MOUTH EVERY 6 HOURS AS NEEDED FOR WHEEZE OR SHORTNESS OF BREATH 8.5 Inhaler 0  . atorvastatin (LIPITOR) 20 MG tablet TAKE 1 TABLET BY MOUTH EVERY DAY 90 tablet 1  . CYCLOPHOSPHAMIDE IV Inject into the vein every 14 (fourteen) days.    . diphenhydrAMINE (BENADRYL) 25 mg capsule Take 25 mg by mouth every 6 (six) hours as needed for allergies. Allergies.    Marland Kitchen DOXOrubicin HCl (ADRIAMYCIN IV) Inject into the vein every 14 (fourteen) days.    Marland Kitchen esomeprazole (NEXIUM) 20 MG capsule Take 20 mg by mouth daily before breakfast.    . ibuprofen (ADVIL,MOTRIN) 200 MG tablet Take 400-600 mg by mouth every 8 (eight) hours as needed for moderate pain (pain.).     Marland Kitchen lisinopril (ZESTRIL) 5 MG tablet TAKE 1 TABLET BY MOUTH EVERY DAY 90 tablet 1  . naproxen sodium (ALEVE) 220 MG tablet Take 440 mg by mouth 3 (three) times daily as needed (pain.).     Marland Kitchen pegfilgrastim-jmdb (FULPHILA) 6 MG/0.6ML injection Inject 6 mg into the skin every 14 (fourteen) days.    . potassium chloride (KLOR-CON) 10 MEQ tablet Take 1 tablet (10 mEq total) by mouth daily. 30 tablet 2  . sertraline (ZOLOFT) 50 MG tablet TAKE 1 TABLET BY MOUTH EVERY DAY 90 tablet 0  . lidocaine-prilocaine (EMLA) cream Apply a pea sized amount to port a cath site and cover with plastic wrap 1 hour prior to chemotherapy appointments (Patient not taking: Reported on 10/16/2020) 30 g 3  . prochlorperazine (COMPAZINE) 10 MG tablet Take 1 tablet (10 mg total) by mouth every 6 (six) hours as needed (Nausea or vomiting). (Patient not taking: Reported on 10/16/2020) 30 tablet 1   No current facility-administered medications for this visit.    ALLERGIES:  No Known Allergies  PHYSICAL EXAM:   Performance status (ECOG): 0 - Asymptomatic  Vitals:   10/16/20 1000  BP: 140/77  Pulse: 98  Resp: 18  Temp: (!) 97 F (36.1 C)  SpO2: 98%   Wt Readings  from Last 3 Encounters:  10/16/20 161 lb 9.6 oz (73.3 kg)  10/02/20 161 lb 1.6 oz (73.1 kg)  09/14/20 163 lb 6.4 oz (74.1 kg)   Physical Exam Vitals reviewed.  Constitutional:      Appearance: Normal appearance. She is obese.  Cardiovascular:     Rate and Rhythm: Normal rate and regular rhythm.     Pulses: Normal pulses.     Heart sounds: Normal heart sounds.  Pulmonary:     Effort: Pulmonary effort is normal.     Breath sounds: Normal breath sounds.  Neurological:     General: No focal deficit present.     Mental Status: She is alert and oriented to person, place, and time.  Psychiatric:        Mood and Affect: Mood normal.        Behavior: Behavior normal.     LABORATORY DATA:  I have reviewed the labs as listed.  CBC Latest Ref Rng & Units 10/16/2020 10/09/2020 10/02/2020  WBC 4.0 - 10.5 K/uL 4.9 6.2 9.5  Hemoglobin 12.0 - 15.0 g/dL 10.3(L) 10.8(L) 11.0(L)  Hematocrit 36 - 46 % 30.6(L) 32.7(L) 33.6(L)  Platelets 150 - 400 K/uL 266 361 306   CMP Latest Ref Rng & Units 10/16/2020 10/09/2020 10/02/2020  Glucose 70 - 99 mg/dL 194(H) 165(H) 240(H)  BUN 6 - 20 mg/dL $Remove'15 18 17  'hgDalsl$ Creatinine 0.44 - 1.00 mg/dL 0.96 0.77 0.82  Sodium 135 - 145 mmol/L 134(L) 135 133(L)  Potassium 3.5 - 5.1 mmol/L 3.8 3.8 3.6  Chloride 98 - 111 mmol/L 99 102 99  CO2 22 - 32 mmol/L $RemoveB'25 25 23  'xecPyzyk$ Calcium 8.9 - 10.3 mg/dL 9.0 9.3 9.0  Total Protein 6.5 - 8.1 g/dL 6.6 6.8 6.4(L)  Total Bilirubin 0.3 - 1.2 mg/dL 0.4 0.4 0.5  Alkaline Phos 38 - 126 U/L 67 74 80  AST 15 - 41 U/L 15 14(L) 13(L)  ALT 0 - 44 U/L $Remo'25 21 19    'qpYkm$ DIAGNOSTIC IMAGING:  I have independently reviewed the scans and discussed with the patient. No results found.   ASSESSMENT:  1. Triple negative left breast cancer (T2N1): -Patient identified left breast mass with left  nipple retraction about 2 months ago. -Mammogram on 06/13/2020 showed mass in the retroareolar 12 o'clock position in the left breast. 2 suspicious left axillary lymph nodes, largest measuring 2 cm. -Breast ultrasound showed 2.7 x 1.6 x 2.2 cm mass in the 12:00 retroareolar position. Just lateral to the mass is a calcified hypoechoic nodule that corresponds to densely calcified degenerating fibroadenoma. -Left breast 12:00 biopsy showed invasive ductal carcinoma, grade 2/3. Left axillary lymph node biopsy was consistent with meta stasis. -ER negative, PR negative and Ki-67 15%. HER-2 equivocal by IHC. HER-2 FISH negative. -MRI of the breast on 07/19/2020 shows 4.2 cm enhancing mass in the subareolar left breast, enhancement extending to the level of the left nipple with associated nipple retraction and periareolar skin thickening. 6 morphologically abnormal level 1 left axillary lymph nodes. No evidence of malignancy in the right breast. -CT CAP on 07/14/2020 shows dominant left axillary nodes measuring 1.8 cm short axis. Small left subpectoral nodes measuring up to 7 mm short axis, suspicious. Small mediastinal lymph nodes measuring 7 to 9 mm, thought to be reactive. No other evidence of metastatic disease. -NeoadjuvantDose dense ACstarted on 08/01/2020.  2. Family history: -Father had gastroesophageal junction cancer. Maternal grandmother had throat cancer and paternal aunt had breast cancer. -[facility] genetics testing showed MSH2  VUS.  3. High risk drug monitoring: -Echo on 07/12/2020 shows EF 60 to 65%.   PLAN:  1. T2N1 left breast TNBC: -She has tolerated 2 cycles of weekly carboplatin and paclitaxel reasonably well. -She reported more fatigue since the start of this regimen. -I reviewed her labs which show normal LFTs and CBC.  She is more anemic with a hemoglobin of 10.3, likely from myelosuppression. -We will proceed with next treatment today.  We will plan to see her back in 2  weeks for follow-up.  2. Family history: -Genetic testing did not show any pathological mutations.  3. Hypokalemia: -Continue potassium 10 mEq daily.  Potassium today 3.8.   Orders placed this encounter:  No orders of the defined types were placed in this encounter.    Derek Jack, MD [facility] [phone]   I, Milinda Antis, am acting as a scribe for Dr. Sanda Linger.  I, Derek Jack MD, have reviewed the above documentation for accuracy and completeness, and I agree with the above.

## 2020-10-23 ENCOUNTER — Inpatient Hospital Stay (HOSPITAL_COMMUNITY): Payer: BC Managed Care – PPO

## 2020-10-23 ENCOUNTER — Ambulatory Visit (HOSPITAL_COMMUNITY): Payer: BC Managed Care – PPO | Admitting: Hematology

## 2020-10-23 ENCOUNTER — Other Ambulatory Visit: Payer: Self-pay

## 2020-10-23 ENCOUNTER — Other Ambulatory Visit: Payer: Self-pay | Admitting: Family Medicine

## 2020-10-23 VITALS — BP 120/62 | HR 89 | Temp 97.0°F | Resp 18

## 2020-10-23 DIAGNOSIS — Z95828 Presence of other vascular implants and grafts: Secondary | ICD-10-CM

## 2020-10-23 DIAGNOSIS — C50919 Malignant neoplasm of unspecified site of unspecified female breast: Secondary | ICD-10-CM

## 2020-10-23 DIAGNOSIS — Z5111 Encounter for antineoplastic chemotherapy: Secondary | ICD-10-CM | POA: Diagnosis not present

## 2020-10-23 LAB — COMPREHENSIVE METABOLIC PANEL
ALT: 28 U/L (ref 0–44)
AST: 16 U/L (ref 15–41)
Albumin: 3.6 g/dL (ref 3.5–5.0)
Alkaline Phosphatase: 67 U/L (ref 38–126)
Anion gap: 9 (ref 5–15)
BUN: 14 mg/dL (ref 6–20)
CO2: 24 mmol/L (ref 22–32)
Calcium: 9 mg/dL (ref 8.9–10.3)
Chloride: 101 mmol/L (ref 98–111)
Creatinine, Ser: 0.85 mg/dL (ref 0.44–1.00)
GFR, Estimated: >60 mL/min (ref >60)
Glucose, Bld: 230 mg/dL — ABNORMAL HIGH (ref 70–99)
Potassium: 3.5 mmol/L (ref 3.5–5.1)
Sodium: 134 mmol/L — ABNORMAL LOW (ref 135–145)
Total Bilirubin: 0.5 mg/dL (ref 0.3–1.2)
Total Protein: 6.4 g/dL — ABNORMAL LOW (ref 6.5–8.1)

## 2020-10-23 LAB — CBC WITH DIFFERENTIAL/PLATELET
Abs Immature Granulocytes: 0.04 10*3/uL (ref 0.00–0.07)
Basophils Absolute: 0 10*3/uL (ref 0.0–0.1)
Basophils Relative: 1 %
Eosinophils Absolute: 0.1 10*3/uL (ref 0.0–0.5)
Eosinophils Relative: 3 %
HCT: 28.6 % — ABNORMAL LOW (ref 36.0–46.0)
Hemoglobin: 9.7 g/dL — ABNORMAL LOW (ref 12.0–15.0)
Immature Granulocytes: 1 %
Lymphocytes Relative: 25 %
Lymphs Abs: 1.1 10*3/uL (ref 0.7–4.0)
MCH: 29.5 pg (ref 26.0–34.0)
MCHC: 33.9 g/dL (ref 30.0–36.0)
MCV: 86.9 fL (ref 80.0–100.0)
Monocytes Absolute: 0.3 10*3/uL (ref 0.1–1.0)
Monocytes Relative: 7 %
Neutro Abs: 2.8 10*3/uL (ref 1.7–7.7)
Neutrophils Relative %: 63 %
Platelets: 174 10*3/uL (ref 150–400)
RBC: 3.29 MIL/uL — ABNORMAL LOW (ref 3.87–5.11)
RDW: 17.2 % — ABNORMAL HIGH (ref 11.5–15.5)
WBC: 4.5 10*3/uL (ref 4.0–10.5)
nRBC: 0 % (ref 0.0–0.2)

## 2020-10-23 MED ORDER — SODIUM CHLORIDE 0.9 % IV SOLN
10.0000 mg | Freq: Once | INTRAVENOUS | Status: AC
  Administered 2020-10-23: 10 mg via INTRAVENOUS
  Filled 2020-10-23: qty 1

## 2020-10-23 MED ORDER — PALONOSETRON HCL INJECTION 0.25 MG/5ML
0.2500 mg | Freq: Once | INTRAVENOUS | Status: AC
  Administered 2020-10-23: 0.25 mg via INTRAVENOUS
  Filled 2020-10-23: qty 5

## 2020-10-23 MED ORDER — HEPARIN SOD (PORK) LOCK FLUSH 100 UNIT/ML IV SOLN
500.0000 [IU] | Freq: Once | INTRAVENOUS | Status: AC | PRN
  Administered 2020-10-23: 500 [IU]

## 2020-10-23 MED ORDER — SODIUM CHLORIDE 0.9 % IV SOLN: Freq: Once | INTRAVENOUS | Status: AC

## 2020-10-23 MED ORDER — DIPHENHYDRAMINE HCL 50 MG/ML IJ SOLN
50.0000 mg | Freq: Once | INTRAMUSCULAR | Status: AC
  Administered 2020-10-23: 50 mg via INTRAVENOUS
  Filled 2020-10-23: qty 1

## 2020-10-23 MED ORDER — SODIUM CHLORIDE 0.9 % IV SOLN
230.0000 mg | Freq: Once | INTRAVENOUS | Status: AC
  Administered 2020-10-23: 230 mg via INTRAVENOUS
  Filled 2020-10-23: qty 23

## 2020-10-23 MED ORDER — SODIUM CHLORIDE 0.9 % IV SOLN
80.0000 mg/m2 | Freq: Once | INTRAVENOUS | Status: AC
  Administered 2020-10-23: 138 mg via INTRAVENOUS
  Filled 2020-10-23: qty 23

## 2020-10-23 MED ORDER — SODIUM CHLORIDE 0.9% FLUSH
10.0000 mL | INTRAVENOUS | Status: DC | PRN
  Administered 2020-10-23: 10 mL

## 2020-10-23 MED ORDER — FAMOTIDINE IN NACL 20-0.9 MG/50ML-% IV SOLN
20.0000 mg | Freq: Once | INTRAVENOUS | Status: AC
  Administered 2020-10-23: 20 mg via INTRAVENOUS
  Filled 2020-10-23: qty 50

## 2020-10-23 NOTE — Patient Instructions (Signed)
[facility] at [facility] Discharge Instructions  Labs drawn from portacath today   Thank you for choosing [facility] at [facility] to provide your oncology and hematology care.  To afford each patient quality time with our provider, please arrive at least 15 minutes before your scheduled appointment time.   If you have a lab appointment with the Cancer Center please come in thru the Main Entrance and check in at the main information desk.  You need to re-schedule your appointment should you arrive 10 or more minutes late.  We strive to give you quality time with our providers, and arriving late affects you and other patients whose appointments are after yours.  Also, if you no show three or more times for appointments you may be dismissed from the clinic at the providers discretion.     Again, thank you for choosing [facility].  Our hope is that these requests will decrease the amount of time that you wait before being seen by our physicians.       _____________________________________________________________  Should you have questions after your visit to [facility], please contact our office at [phone] and follow the prompts.  Our office hours are 8:00 a.m. and 4:30 p.m. Monday - Friday.  Please note that voicemails left after 4:00 p.m. may not be returned until the following business day.  We are closed weekends and major holidays.  You do have access to a nurse 24-7, just call the main number to the clinic [phone] and do not press any options, hold on the line and a nurse will answer the phone.    For prescription refill requests, have your pharmacy contact our office and allow 72 hours.    Due to Covid, you will need to wear a mask upon entering the hospital. If you do not have a mask, a mask will be given to you at the Main Entrance upon arrival. For doctor visits, patients may have 1 support person age 18  or older with them. For treatment visits, patients can not have anyone with them due to social distancing guidelines and our immunocompromised population.     

## 2020-10-23 NOTE — Progress Notes (Signed)
Elizabeth Graves presents today for [employer]. Pt denies any new changes or symptoms since last treatment. Lab results and vitals have been reviewed and are stable and within parameters for treatment. Proceeding with treatment today as planned.  Infusions tolerated without incident or complaint. VSS upon completion of treatment. Port flushed and deaccessed per protocol, see MAR and IV flowsheet for details. Discharged in satisfactory condition with follow up instructions.

## 2020-10-23 NOTE — Telephone Encounter (Signed)
May have 90 days follow-up within 90 days please

## 2020-10-23 NOTE — Patient Instructions (Signed)
[facility] Discharge Instructions for Patients Receiving Chemotherapy   Beginning January 23rd 2017 lab work for the Cancer Center will be done in the  Main lab at  on 1st floor. If you have a lab appointment with the Cancer Center please come in thru the  Main Entrance and check in at the main information desk   Today you received the following chemotherapy agents Carboplatin and Taxol  To help prevent nausea and vomiting after your treatment, we encourage you to take your nausea medication   If you develop nausea and vomiting, or diarrhea that is not controlled by your medication, call the clinic.  The clinic phone number is [phone]. Office hours are Monday-Friday 8:30am-5:00pm.  BELOW ARE SYMPTOMS THAT SHOULD BE REPORTED IMMEDIATELY:  *FEVER GREATER THAN 101.0 F  *CHILLS WITH OR WITHOUT FEVER  NAUSEA AND VOMITING THAT IS NOT CONTROLLED WITH YOUR NAUSEA MEDICATION  *UNUSUAL SHORTNESS OF BREATH  *UNUSUAL BRUISING OR BLEEDING  TENDERNESS IN MOUTH AND THROAT WITH OR WITHOUT PRESENCE OF ULCERS  *URINARY PROBLEMS  *BOWEL PROBLEMS  UNUSUAL RASH Items with * indicate a potential emergency and should be followed up as soon as possible. If you have an emergency after office hours please contact your primary care physician or go to the nearest emergency department.  Please call the clinic during office hours if you have any questions or concerns.   You may also contact the Patient Navigator at [phone] should you have any questions or need assistance in obtaining follow up care.      Resources For Cancer Patients and their Caregivers ? American Cancer Society: Can assist with transportation, wigs, general needs, runs Look Good Feel Better.        1-888-227-6333 ? Cancer Care: Provides financial assistance, online support groups, medication/co-pay assistance.  1-800-813-HOPE (4673) ? [facility] Assists  [place] cancer patients and their families through emotional , educational and financial support.  336-427-4357 ? [place] DSS Where to apply for food stamps, Medicaid and utility assistance. 336-342-1394 ? [facility]: Transportation to medical appointments. 336-347-2287 ? Social Security Administration: May apply for disability if have a Stage IV cancer. 336-342-7796 1-800-772-1213 ? [place] Aging, Disability and Transit Services: Assists with nutrition, care and transit needs. 336-349-2343          

## 2020-10-30 ENCOUNTER — Inpatient Hospital Stay (HOSPITAL_COMMUNITY): Payer: BC Managed Care – PPO | Attending: Hematology

## 2020-10-30 ENCOUNTER — Inpatient Hospital Stay (HOSPITAL_COMMUNITY): Payer: BC Managed Care – PPO

## 2020-10-30 ENCOUNTER — Inpatient Hospital Stay (HOSPITAL_BASED_OUTPATIENT_CLINIC_OR_DEPARTMENT_OTHER): Payer: BC Managed Care – PPO | Admitting: Hematology

## 2020-10-30 ENCOUNTER — Other Ambulatory Visit: Payer: Self-pay

## 2020-10-30 VITALS — BP 127/70 | HR 91 | Temp 97.0°F | Resp 18 | Wt 162.2 lb

## 2020-10-30 VITALS — BP 118/59 | HR 94 | Temp 97.2°F | Resp 18

## 2020-10-30 DIAGNOSIS — C50919 Malignant neoplasm of unspecified site of unspecified female breast: Secondary | ICD-10-CM

## 2020-10-30 DIAGNOSIS — Z95828 Presence of other vascular implants and grafts: Secondary | ICD-10-CM

## 2020-10-30 DIAGNOSIS — C50012 Malignant neoplasm of nipple and areola, left female breast: Secondary | ICD-10-CM | POA: Diagnosis present

## 2020-10-30 DIAGNOSIS — Z171 Estrogen receptor negative status [ER-]: Secondary | ICD-10-CM | POA: Diagnosis present

## 2020-10-30 DIAGNOSIS — Z803 Family history of malignant neoplasm of breast: Secondary | ICD-10-CM | POA: Insufficient documentation

## 2020-10-30 DIAGNOSIS — Z87891 Personal history of nicotine dependence: Secondary | ICD-10-CM | POA: Diagnosis not present

## 2020-10-30 DIAGNOSIS — E876 Hypokalemia: Secondary | ICD-10-CM | POA: Diagnosis not present

## 2020-10-30 DIAGNOSIS — Z8541 Personal history of malignant neoplasm of cervix uteri: Secondary | ICD-10-CM | POA: Diagnosis not present

## 2020-10-30 DIAGNOSIS — N6453 Retraction of nipple: Secondary | ICD-10-CM | POA: Diagnosis not present

## 2020-10-30 DIAGNOSIS — Z800 Family history of malignant neoplasm of digestive organs: Secondary | ICD-10-CM | POA: Diagnosis not present

## 2020-10-30 DIAGNOSIS — Z801 Family history of malignant neoplasm of trachea, bronchus and lung: Secondary | ICD-10-CM | POA: Insufficient documentation

## 2020-10-30 DIAGNOSIS — Z5111 Encounter for antineoplastic chemotherapy: Secondary | ICD-10-CM | POA: Diagnosis not present

## 2020-10-30 LAB — COMPREHENSIVE METABOLIC PANEL
ALT: 24 U/L (ref 0–44)
AST: 14 U/L — ABNORMAL LOW (ref 15–41)
Albumin: 3.6 g/dL (ref 3.5–5.0)
Alkaline Phosphatase: 62 U/L (ref 38–126)
Anion gap: 6 (ref 5–15)
BUN: 14 mg/dL (ref 6–20)
CO2: 24 mmol/L (ref 22–32)
Calcium: 8.8 mg/dL — ABNORMAL LOW (ref 8.9–10.3)
Chloride: 103 mmol/L (ref 98–111)
Creatinine, Ser: 0.76 mg/dL (ref 0.44–1.00)
GFR, Estimated: >60 mL/min (ref >60)
Glucose, Bld: 216 mg/dL — ABNORMAL HIGH (ref 70–99)
Potassium: 3.7 mmol/L (ref 3.5–5.1)
Sodium: 133 mmol/L — ABNORMAL LOW (ref 135–145)
Total Bilirubin: 0.5 mg/dL (ref 0.3–1.2)
Total Protein: 6.2 g/dL — ABNORMAL LOW (ref 6.5–8.1)

## 2020-10-30 LAB — CBC WITH DIFFERENTIAL/PLATELET
Abs Immature Granulocytes: 0.02 10*3/uL (ref 0.00–0.07)
Basophils Absolute: 0 10*3/uL (ref 0.0–0.1)
Basophils Relative: 1 %
Eosinophils Absolute: 0.1 10*3/uL (ref 0.0–0.5)
Eosinophils Relative: 3 %
HCT: 28.2 % — ABNORMAL LOW (ref 36.0–46.0)
Hemoglobin: 9.2 g/dL — ABNORMAL LOW (ref 12.0–15.0)
Immature Granulocytes: 0 %
Lymphocytes Relative: 25 %
Lymphs Abs: 1.2 10*3/uL (ref 0.7–4.0)
MCH: 28.8 pg (ref 26.0–34.0)
MCHC: 32.6 g/dL (ref 30.0–36.0)
MCV: 88.4 fL (ref 80.0–100.0)
Monocytes Absolute: 0.4 10*3/uL (ref 0.1–1.0)
Monocytes Relative: 8 %
Neutro Abs: 2.9 10*3/uL (ref 1.7–7.7)
Neutrophils Relative %: 63 %
Platelets: 117 10*3/uL — ABNORMAL LOW (ref 150–400)
RBC: 3.19 MIL/uL — ABNORMAL LOW (ref 3.87–5.11)
RDW: 17.2 % — ABNORMAL HIGH (ref 11.5–15.5)
WBC: 4.6 10*3/uL (ref 4.0–10.5)
nRBC: 0 % (ref 0.0–0.2)

## 2020-10-30 MED ORDER — SODIUM CHLORIDE 0.9 % IV SOLN
80.0000 mg/m2 | Freq: Once | INTRAVENOUS | Status: AC
  Administered 2020-10-30: 138 mg via INTRAVENOUS
  Filled 2020-10-30: qty 23

## 2020-10-30 MED ORDER — SODIUM CHLORIDE 0.9 % IV SOLN
230.0000 mg | Freq: Once | INTRAVENOUS | Status: AC
  Administered 2020-10-30: 230 mg via INTRAVENOUS
  Filled 2020-10-30: qty 23

## 2020-10-30 MED ORDER — DIPHENHYDRAMINE HCL 50 MG/ML IJ SOLN
50.0000 mg | Freq: Once | INTRAMUSCULAR | Status: AC
  Administered 2020-10-30: 50 mg via INTRAVENOUS
  Filled 2020-10-30: qty 1

## 2020-10-30 MED ORDER — SODIUM CHLORIDE 0.9 % IV SOLN
20.0000 mg | Freq: Once | INTRAVENOUS | Status: AC
  Administered 2020-10-30: 20 mg via INTRAVENOUS
  Filled 2020-10-30: qty 20

## 2020-10-30 MED ORDER — SODIUM CHLORIDE 0.9 % IV SOLN: Freq: Once | INTRAVENOUS | Status: AC

## 2020-10-30 MED ORDER — HEPARIN SOD (PORK) LOCK FLUSH 100 UNIT/ML IV SOLN
500.0000 [IU] | Freq: Once | INTRAVENOUS | Status: AC | PRN
  Administered 2020-10-30: 500 [IU]

## 2020-10-30 MED ORDER — FAMOTIDINE IN NACL 20-0.9 MG/50ML-% IV SOLN
20.0000 mg | Freq: Once | INTRAVENOUS | Status: AC
  Administered 2020-10-30: 20 mg via INTRAVENOUS
  Filled 2020-10-30: qty 50

## 2020-10-30 NOTE — Progress Notes (Signed)
[place], [place] [address]   CLINIC:  Medical Oncology/Hematology  PCP:  Kathyrn Drown, MD [address] [place] / [place] [address] [phone]   REASON FOR VISIT:  Follow-up for left breast cancer  PRIOR THERAPY: Adriamycin and cyclophosphamide x 4 cycles from 08/01/2020 to 09/15/2020  NGS Results: Not done  CURRENT THERAPY: Carboplatin & paclitaxel weekly  BRIEF ONCOLOGIC HISTORY:  Oncology History  Triple negative malignant neoplasm of breast ([place])  07/05/2020 Initial Diagnosis   Triple negative malignant neoplasm of breast ([place])   08/01/2020 -  Chemotherapy   The patient had DOXOrubicin (ADRIAMYCIN) chemo injection 104 mg, 60 mg/m2 = 104 mg, Intravenous,  Once, 4 of 4 cycles Administration: 104 mg (08/01/2020), 104 mg (08/14/2020), 104 mg (09/01/2020), 104 mg (09/15/2020) palonosetron (ALOXI) injection 0.25 mg, 0.25 mg, Intravenous,  Once, 6 of 8 cycles Administration: 0.25 mg (08/01/2020), 0.25 mg (10/02/2020), 0.25 mg (08/14/2020), 0.25 mg (09/01/2020), 0.25 mg (09/15/2020), 0.25 mg (10/23/2020) pegfilgrastim (NEULASTA ONPRO KIT) injection 6 mg, 6 mg, Subcutaneous, Once, 1 of 1 cycle Administration: 6 mg (09/01/2020) pegfilgrastim-jmdb (FULPHILA) injection 6 mg, 6 mg, Subcutaneous,  Once, 3 of 3 cycles Administration: 6 mg (08/03/2020), 6 mg (08/16/2020), 6 mg (09/18/2020) CARBOplatin (PARAPLATIN) 240 mg in sodium chloride 0.9 % 250 mL chemo infusion, 240 mg (98.1 % of original dose 247 mg), Intravenous,  Once, 2 of 4 cycles Dose modification:   (original dose 247 mg, Cycle 5, Reason: Provider Judgment),   (original dose 247 mg, Cycle 6, Reason: Provider Judgment) Administration: 240 mg (10/02/2020), 240 mg (10/09/2020), 230 mg (10/23/2020), 210 mg (10/16/2020) cyclophosphamide (CYTOXAN) 1,040 mg in sodium chloride 0.9 % 250 mL chemo infusion, 600 mg/m2 = 1,040 mg, Intravenous,  Once, 4 of 4 cycles Administration: 1,040 mg (08/01/2020), 1,040 mg  (08/14/2020), 1,040 mg (09/01/2020), 1,040 mg (09/15/2020) PACLitaxel (TAXOL) 138 mg in sodium chloride 0.9 % 250 mL chemo infusion (</= 35m/m2), 80 mg/m2 = 138 mg, Intravenous,  Once, 2 of 4 cycles Administration: 138 mg (10/02/2020), 138 mg (10/09/2020), 138 mg (10/23/2020), 138 mg (10/16/2020) fosaprepitant (EMEND) 150 mg in sodium chloride 0.9 % 145 mL IVPB, 150 mg, Intravenous,  Once, 4 of 4 cycles Administration: 150 mg (08/01/2020), 150 mg (08/14/2020), 150 mg (09/01/2020), 150 mg (09/15/2020)  for chemotherapy treatment.    08/17/2020 Genetic Testing   Negative genetic testing:  No pathogenic variants detected on the [facility] CustomNext-Cancer + RNAinsight panel. A variant of uncertain significance (VUS) was detected in the MSH2 gene called c.1600C>T (p.R534C). The report date is 08/17/2020.  The CustomNext-Cancer+RNAinsight panel offered by [employer] sequencing and rearrangement analysis for up to 91 genes, which included the following 47 genes for Ms. Givan:  APC*, ATM*, AXIN2, BARD1, BMPR1A, BRCA1*, BRCA2*, BRIP1*, CDH1*, CDK4, CDKN2A, CHEK2*, DICER1, EPCAM, GREM1, HOXB13, MEN1, MLH1*, MSH2*, MSH3, MSH6*, MUTYH*, NBN, NF1*, NF2, NTHL1, PALB2*, PMS2*, POLD1, POLE, PTEN*, RAD51C*, RAD51D*, RECQL, RET, SDHA, SDHAF2, SDHB, SDHC, SDHD, SMAD4, SMARCA4, STK11, TP53*, TSC1, TSC2, and VHL.  DNA and RNA analyses performed for * genes.       CANCER STAGING: Cancer Staging No matching staging information was found for the patient.  INTERVAL HISTORY:  Ms. VLEANORE BIGGERS a 54y.o. female, returns for routine follow-up and consideration for next cycle of chemotherapy. Joscelin was last seen on 10/16/2020.  Due for day #8 of cycle #6 of carboplatin and paclitaxel today.   Overall, she tells me she has been feeling  pretty well. He is tolerating the previous treatment well and only complains of SOB with prolonged activity; she denies extreme fatigue, N/V/D/C, numbness or tingling. She is taking  potassium at home. Her appetite is good and she denies mouth sores.   Overall, she feels ready for next cycle of chemo today.    REVIEW OF SYSTEMS:  Review of Systems  Constitutional: Positive for appetite change (50-75%) and fatigue (75%).  HENT:   Negative for mouth sores.   Respiratory: Positive for shortness of breath (w/ exertion).   Gastrointestinal: Negative for constipation, diarrhea, nausea and vomiting.  Genitourinary: Positive for frequency.   Neurological: Positive for headaches (sinus). Negative for numbness.  All other systems reviewed and are negative.   PAST MEDICAL/SURGICAL HISTORY:  Past Medical History:  Diagnosis Date   Asthma    Cancer ([place])    cervical - 20 yrs ago   Carpal tunnel syndrome    Depression    Family history of breast cancer    Family history of esophageal cancer    Family history of lung cancer    Family history of throat cancer    Hypertension    Hypokalemia    tx w/ k-Dur   Port-A-Cath in place 07/24/2020   Seasonal allergies    tx with benadryl prn   SVD (spontaneous vaginal delivery)    x 2   Past Surgical History:  Procedure Laterality Date   ABDOMINAL HYSTERECTOMY     CO2 Laser of Cervix     for cervical cancer 20 yrs ago   DILATION AND CURETTAGE OF UTERUS  12/2010   polyp removed   DILATION AND CURETTAGE OF UTERUS     x 3 for MAB   LAPAROSCOPIC ASSISTED VAGINAL HYSTERECTOMY  06/09/2012   Procedure: LAPAROSCOPIC ASSISTED VAGINAL HYSTERECTOMY;  Surgeon: Gus Height, MD;  Location: [facility];  Service: Gynecology;  Laterality: N/A;   PORTACATH PLACEMENT Right 07/17/2020   Procedure: INSERTION PORT-A-CATH;  Surgeon: Virl Cagey, MD;  Location: [facility];  Service: General;  Laterality: Right;   SALPINGOOPHORECTOMY  06/09/2012   Procedure: SALPINGO OOPHERECTOMY;  Surgeon: Gus Height, MD;  Location: [facility];  Service: Gynecology;  Laterality: Bilateral;   TUBAL LIGATION      SOCIAL HISTORY:  Social History    Socioeconomic History   Marital status: Married    Spouse name: Not on file   Number of children: 2   Years of education: Not on file   Highest education level: Not on file  Occupational History   Occupation: EMPLOYED  Tobacco Use   Smoking status: Former Smoker    Packs/day: 1.00    Years: 16.00    Pack years: 16.00    Types: Cigarettes    Start date: 05/30/1994    Quit date: 01/31/2020    Years since quitting: 0.7   Smokeless tobacco: Never Used  Vaping Use   Vaping Use: Never used  Substance and Sexual Activity   Alcohol use: No   Drug use: No   Sexual activity: Yes    Birth control/protection: Surgical  Other Topics Concern   Not on file  Social History Narrative   Not on file   Social Determinants of Health   Financial Resource Strain: Low Risk    Difficulty of Paying Living Expenses: Not very hard  Food Insecurity: No Food Insecurity   Worried About Running Out of Food in the Last Year: Never true   [place] in the Last Year: Never  true  Transportation Needs: No Transportation Needs   Lack of Transportation (Medical): No   Lack of Transportation (Non-Medical): No  Physical Activity: Inactive   Days of Exercise per Week: 0 days   Minutes of Exercise per Session: 0 min  Stress: Stress Concern Present   Feeling of Stress : To some extent  Social Connections: Moderately Isolated   Frequency of Communication with Friends and Family: Three times a week   Frequency of Social Gatherings with Friends and Family: Once a week   Attends Religious Services: Never   Marine scientist or Organizations: No   Attends Music therapist: Never   Marital Status: Married  Human resources officer Violence: Not At Risk   Fear of Current or Ex-Partner: No   Emotionally Abused: No   Physically Abused: No   Sexually Abused: No    FAMILY HISTORY:  Family History  Problem Relation Age of Onset   Heart disease Mother    Lung  cancer Mother 63       smoker   Heart disease Father    Esophageal cancer Father        GE junction, dx. in his early 8s   Diverticulitis Sister    Diabetes Sister    Diabetes Maternal Grandmother    Heart disease Maternal Grandmother    Throat cancer Maternal Grandmother        dx. late 58s   Healthy Sister    Healthy Daughter    Healthy Son    Breast cancer Paternal Aunt        limited info    CURRENT MEDICATIONS:  Current Outpatient Medications  Medication Sig Dispense Refill   albuterol (VENTOLIN HFA) 108 (90 Base) MCG/ACT inhaler TAKE 2 PUFFS BY MOUTH EVERY 6 HOURS AS NEEDED FOR WHEEZE OR SHORTNESS OF BREATH 8.5 Inhaler 0   atorvastatin (LIPITOR) 20 MG tablet TAKE 1 TABLET BY MOUTH EVERY DAY 90 tablet 1   CYCLOPHOSPHAMIDE IV Inject into the vein every 14 (fourteen) days.     diphenhydrAMINE (BENADRYL) 25 mg capsule Take 25 mg by mouth every 6 (six) hours as needed for allergies. Allergies.     DOXOrubicin HCl (ADRIAMYCIN IV) Inject into the vein every 14 (fourteen) days.     esomeprazole (NEXIUM) 20 MG capsule Take 20 mg by mouth daily before breakfast.     ibuprofen (ADVIL,MOTRIN) 200 MG tablet Take 400-600 mg by mouth every 8 (eight) hours as needed for moderate pain (pain.).      lisinopril (ZESTRIL) 5 MG tablet TAKE 1 TABLET BY MOUTH EVERY DAY 90 tablet 1   naproxen sodium (ALEVE) 220 MG tablet Take 440 mg by mouth 3 (three) times daily as needed (pain.).      pegfilgrastim-jmdb (FULPHILA) 6 MG/0.6ML injection Inject 6 mg into the skin every 14 (fourteen) days.     potassium chloride (KLOR-CON) 10 MEQ tablet Take 1 tablet (10 mEq total) by mouth daily. 30 tablet 2   sertraline (ZOLOFT) 50 MG tablet TAKE 1 TABLET BY MOUTH EVERY DAY 90 tablet 0   lidocaine-prilocaine (EMLA) cream Apply a pea sized amount to port a cath site and cover with plastic wrap 1 hour prior to chemotherapy appointments (Patient not taking: Reported on 10/30/2020) 30 g 3    prochlorperazine (COMPAZINE) 10 MG tablet Take 1 tablet (10 mg total) by mouth every 6 (six) hours as needed (Nausea or vomiting). (Patient not taking: Reported on 10/30/2020) 30 tablet 1   No current facility-administered medications  for this visit.    ALLERGIES:  No Known Allergies  PHYSICAL EXAM:  Performance status (ECOG): 0 - Asymptomatic  Vitals:   10/30/20 0900  BP: 127/70  Pulse: 91  Resp: 18  Temp: (!) 97 F (36.1 C)  SpO2: 99%   Wt Readings from Last 3 Encounters:  10/30/20 162 lb 3.2 oz (73.6 kg)  10/23/20 161 lb 3.2 oz (73.1 kg)  10/16/20 161 lb 9.6 oz (73.3 kg)   Physical Exam Vitals reviewed.  Constitutional:      Appearance: Normal appearance. She is obese.  Cardiovascular:     Rate and Rhythm: Normal rate and regular rhythm.     Pulses: Normal pulses.     Heart sounds: Normal heart sounds.  Pulmonary:     Effort: Pulmonary effort is normal.     Breath sounds: Normal breath sounds.  Chest:     Breasts:        Left: Normal. No inverted nipple or mass.     Comments: Port-a-Cath in R chest Lymphadenopathy:     Upper Body:     Left upper body: No axillary or pectoral adenopathy.  Neurological:     General: No focal deficit present.     Mental Status: She is alert and oriented to person, place, and time.  Psychiatric:        Mood and Affect: Mood normal.        Behavior: Behavior normal.     LABORATORY DATA:  I have reviewed the labs as listed.  CBC Latest Ref Rng & Units 10/30/2020 10/23/2020 10/16/2020  WBC 4.0 - 10.5 K/uL 4.6 4.5 4.9  Hemoglobin 12.0 - 15.0 g/dL 9.2(L) 9.7(L) 10.3(L)  Hematocrit 36 - 46 % 28.2(L) 28.6(L) 30.6(L)  Platelets 150 - 400 K/uL 117(L) 174 266   CMP Latest Ref Rng & Units 10/30/2020 10/23/2020 10/16/2020  Glucose 70 - 99 mg/dL 216(H) 230(H) 194(H)  BUN 6 - 20 mg/dL _0 Creatinine 0.44 - 1.00 mg/dL 0.76 0.85 0.96  Sodium 135 - 145 mmol/L 133(L) 134(L) 134(L)  Potassium 3.5 - 5.1 mmol/L 3.7 3.5 3.8  Chloride 98  - 111 mmol/L 103 101 99  CO2 22 - 32 mmol/L _1 Calcium 8.9 - 10.3 mg/dL 8.8(L) 9.0 9.0  Total Protein 6.5 - 8.1 g/dL 6.2(L) 6.4(L) 6.6  Total Bilirubin 0.3 - 1.2 mg/dL 0.5 0.5 0.4  Alkaline Phos 38 - 126 U/L 62 67 67  AST 15 - 41 U/L 14(L) 16 15  ALT 0 - 44 U/L _2 DIAGNOSTIC IMAGING:  I have independently reviewed the scans and discussed with the patient. No results found.   ASSESSMENT:  1. Triple negative left breast cancer (T2N1): -Patient identified left breast mass with left nipple retraction about 2 months ago. -Mammogram on 06/13/2020 showed mass in the retroareolar 12 o'clock position in the left breast. 2 suspicious left axillary lymph nodes, largest measuring 2 cm. -Breast ultrasound showed 2.7 x 1.6 x 2.2 cm mass in the 12:00 retroareolar position. Just lateral to the mass is a calcified hypoechoic nodule that corresponds to densely calcified degenerating fibroadenoma. -Left breast 12:00 biopsy showed invasive ductal carcinoma, grade 2/3. Left axillary lymph node biopsy was consistent with meta stasis. -ER negative, PR negative and Ki-67 15%. HER-2 equivocal by IHC. HER-2 FISH negative. -MRI of the breast on 07/19/2020 shows 4.2 cm enhancing mass in the subareolar left breast, enhancement extending to the level of the left nipple with  associated nipple retraction and periareolar skin thickening. 6 morphologically abnormal level 1 left axillary lymph nodes. No evidence of malignancy in the right breast. -CT CAP on 07/14/2020 shows dominant left axillary nodes measuring 1.8 cm short axis. Small left subpectoral nodes measuring up to 7 mm short axis, suspicious. Small mediastinal lymph nodes measuring 7 to 9 mm, thought to be reactive. No other evidence of metastatic disease. -NeoadjuvantDose dense ACstarted on 08/01/2020.  2. Family history: -Father had gastroesophageal junction cancer. Maternal grandmother had throat cancer and paternal aunt had breast  cancer. -[facility] genetics testing showed MSH2 VUS.  3. High risk drug monitoring: -Echo on 07/12/2020 shows EF 60 to 65%.   PLAN:  1. T2N1 left breast TNBC: -She has completed 4 weekly doses of paclitaxel and carboplatin. -She has tolerated them very well except some tiredness and shortness of breath on exertion. -Breast exam today showed almost complete resolution of the breast mass.  Inverted nipple has also improved. -Reviewed CBC from today which showed platelet count 179 white count 4.6.  Hemoglobin 9.2.  LFTs are normal. -Proceed with next treatment today and next week.  RTC in 2 weeks.  2. Family history: -Genetic testing did not show any pathological mutations.  3. Hypokalemia: -Continue potassium 10 mEq daily.  Potassium today is 3.7.   Orders placed this encounter:  No orders of the defined types were placed in this encounter.    Derek Jack, MD [place] [phone]   I, Milinda Antis, am acting as a scribe for Dr. Sanda Linger.  I, Derek Jack MD, have reviewed the above documentation for accuracy and completeness, and I agree with the above.

## 2020-10-30 NOTE — Progress Notes (Signed)
Tolerated infusions w/o adverse reaction.  Alert, in no distress.  VSS.  Discharged ambulatory in stable condition.  

## 2020-10-30 NOTE — Patient Instructions (Signed)
[facility] at [facility] Discharge Instructions  Labs drawn from portacath today   Thank you for choosing [facility] at [facility] to provide your oncology and hematology care.  To afford each patient quality time with our provider, please arrive at least 15 minutes before your scheduled appointment time.   If you have a lab appointment with the Cancer Center please come in thru the Main Entrance and check in at the main information desk.  You need to re-schedule your appointment should you arrive 10 or more minutes late.  We strive to give you quality time with our providers, and arriving late affects you and other patients whose appointments are after yours.  Also, if you no show three or more times for appointments you may be dismissed from the clinic at the providers discretion.     Again, thank you for choosing [facility].  Our hope is that these requests will decrease the amount of time that you wait before being seen by our physicians.       _____________________________________________________________  Should you have questions after your visit to [facility], please contact our office at [phone] and follow the prompts.  Our office hours are 8:00 a.m. and 4:30 p.m. Monday - Friday.  Please note that voicemails left after 4:00 p.m. may not be returned until the following business day.  We are closed weekends and major holidays.  You do have access to a nurse 24-7, just call the main number to the clinic [phone] and do not press any options, hold on the line and a nurse will answer the phone.    For prescription refill requests, have your pharmacy contact our office and allow 72 hours.    Due to Covid, you will need to wear a mask upon entering the hospital. If you do not have a mask, a mask will be given to you at the Main Entrance upon arrival. For doctor visits, patients may have 1 support person age 18  or older with them. For treatment visits, patients can not have anyone with them due to social distancing guidelines and our immunocompromised population.     

## 2020-10-30 NOTE — Patient Instructions (Signed)
[place] at [facility] Discharge Instructions  You were seen today by Dr. Delton Coombes. He went over your recent results. You received your treatment today; continue receiving your weekly treatment. Dr. Delton Coombes will see you back in 2 weeks for labs and follow up.   Thank you for choosing [place] at [facility] to provide your oncology and hematology care.  To afford each patient quality time with our provider, please arrive at least 15 minutes before your scheduled appointment time.   If you have a lab appointment with the [facility] please come in thru the Main Entrance and check in at the main information desk  You need to re-schedule your appointment should you arrive 10 or more minutes late.  We strive to give you quality time with our providers, and arriving late affects you and other patients whose appointments are after yours.  Also, if you no show three or more times for appointments you may be dismissed from the clinic at the providers discretion.     Again, thank you for choosing [facility]  Our hope is that these requests will decrease the amount of time that you wait before being seen by our physicians.       _____________________________________________________________  Should you have questions after your visit to [facility], please contact our office at [phone] between the hours of 8:00 a.m. and 4:30 p.m.  Voicemails left after 4:00 p.m. will not be returned until the following business day.  For prescription refill requests, have your pharmacy contact our office and allow 72 hours.    Cancer Center Support Programs:   > Cancer Support Group  2nd Tuesday of the month 1pm-2pm, Journey Room

## 2020-11-06 ENCOUNTER — Other Ambulatory Visit: Payer: Self-pay

## 2020-11-06 ENCOUNTER — Other Ambulatory Visit (HOSPITAL_COMMUNITY): Payer: BC Managed Care – PPO

## 2020-11-06 ENCOUNTER — Ambulatory Visit (HOSPITAL_COMMUNITY): Payer: BC Managed Care – PPO

## 2020-11-06 ENCOUNTER — Inpatient Hospital Stay (HOSPITAL_COMMUNITY): Payer: BC Managed Care – PPO

## 2020-11-06 ENCOUNTER — Encounter (HOSPITAL_COMMUNITY): Payer: Self-pay

## 2020-11-06 VITALS — BP 127/73 | HR 96 | Temp 97.8°F | Resp 18

## 2020-11-06 DIAGNOSIS — C50919 Malignant neoplasm of unspecified site of unspecified female breast: Secondary | ICD-10-CM

## 2020-11-06 DIAGNOSIS — Z5111 Encounter for antineoplastic chemotherapy: Secondary | ICD-10-CM | POA: Diagnosis not present

## 2020-11-06 DIAGNOSIS — Z95828 Presence of other vascular implants and grafts: Secondary | ICD-10-CM

## 2020-11-06 LAB — COMPREHENSIVE METABOLIC PANEL
ALT: 22 U/L (ref 0–44)
AST: 16 U/L (ref 15–41)
Albumin: 3.6 g/dL (ref 3.5–5.0)
Alkaline Phosphatase: 64 U/L (ref 38–126)
Anion gap: 6 (ref 5–15)
BUN: 15 mg/dL (ref 6–20)
CO2: 24 mmol/L (ref 22–32)
Calcium: 8.5 mg/dL — ABNORMAL LOW (ref 8.9–10.3)
Chloride: 106 mmol/L (ref 98–111)
Creatinine, Ser: 0.79 mg/dL (ref 0.44–1.00)
GFR, Estimated: >60 mL/min (ref >60)
Glucose, Bld: 179 mg/dL — ABNORMAL HIGH (ref 70–99)
Potassium: 3.7 mmol/L (ref 3.5–5.1)
Sodium: 136 mmol/L (ref 135–145)
Total Bilirubin: 0.5 mg/dL (ref 0.3–1.2)
Total Protein: 6.1 g/dL — ABNORMAL LOW (ref 6.5–8.1)

## 2020-11-06 LAB — CBC WITH DIFFERENTIAL/PLATELET
Abs Immature Granulocytes: 0.01 10*3/uL (ref 0.00–0.07)
Basophils Absolute: 0 10*3/uL (ref 0.0–0.1)
Basophils Relative: 1 %
Eosinophils Absolute: 0.1 10*3/uL (ref 0.0–0.5)
Eosinophils Relative: 2 %
HCT: 26.6 % — ABNORMAL LOW (ref 36.0–46.0)
Hemoglobin: 8.9 g/dL — ABNORMAL LOW (ref 12.0–15.0)
Immature Granulocytes: 0 %
Lymphocytes Relative: 23 %
Lymphs Abs: 0.8 10*3/uL (ref 0.7–4.0)
MCH: 30.2 pg (ref 26.0–34.0)
MCHC: 33.5 g/dL (ref 30.0–36.0)
MCV: 90.2 fL (ref 80.0–100.0)
Monocytes Absolute: 0.2 10*3/uL (ref 0.1–1.0)
Monocytes Relative: 6 %
Neutro Abs: 2.4 10*3/uL (ref 1.7–7.7)
Neutrophils Relative %: 68 %
Platelets: 87 10*3/uL — ABNORMAL LOW (ref 150–400)
RBC: 2.95 MIL/uL — ABNORMAL LOW (ref 3.87–5.11)
RDW: 17.6 % — ABNORMAL HIGH (ref 11.5–15.5)
WBC: 3.5 10*3/uL — ABNORMAL LOW (ref 4.0–10.5)
nRBC: 0 % (ref 0.0–0.2)

## 2020-11-06 MED ORDER — SODIUM CHLORIDE 0.9% FLUSH
10.0000 mL | INTRAVENOUS | Status: DC | PRN
  Administered 2020-11-06: 10 mL

## 2020-11-06 MED ORDER — FAMOTIDINE IN NACL 20-0.9 MG/50ML-% IV SOLN
INTRAVENOUS | Status: AC
  Filled 2020-11-06: qty 50

## 2020-11-06 MED ORDER — HEPARIN SOD (PORK) LOCK FLUSH 100 UNIT/ML IV SOLN
500.0000 [IU] | Freq: Once | INTRAVENOUS | Status: AC | PRN
  Administered 2020-11-06: 500 [IU]

## 2020-11-06 MED ORDER — DIPHENHYDRAMINE HCL 50 MG/ML IJ SOLN
INTRAMUSCULAR | Status: AC
  Filled 2020-11-06: qty 1

## 2020-11-06 MED ORDER — DIPHENHYDRAMINE HCL 50 MG/ML IJ SOLN
50.0000 mg | Freq: Once | INTRAMUSCULAR | Status: AC
  Administered 2020-11-06: 50 mg via INTRAVENOUS

## 2020-11-06 MED ORDER — SODIUM CHLORIDE 0.9 % IV SOLN
185.2500 mg | Freq: Once | INTRAVENOUS | Status: AC
  Administered 2020-11-06: 190 mg via INTRAVENOUS
  Filled 2020-11-06: qty 19

## 2020-11-06 MED ORDER — SODIUM CHLORIDE 0.9 % IV SOLN
20.0000 mg | Freq: Once | INTRAVENOUS | Status: AC
  Administered 2020-11-06: 20 mg via INTRAVENOUS
  Filled 2020-11-06: qty 20

## 2020-11-06 MED ORDER — SODIUM CHLORIDE 0.9 % IV SOLN
80.0000 mg/m2 | Freq: Once | INTRAVENOUS | Status: AC
  Administered 2020-11-06: 138 mg via INTRAVENOUS
  Filled 2020-11-06: qty 23

## 2020-11-06 MED ORDER — SODIUM CHLORIDE 0.9 % IV SOLN: Freq: Once | INTRAVENOUS | Status: AC

## 2020-11-06 MED ORDER — FAMOTIDINE IN NACL 20-0.9 MG/50ML-% IV SOLN
20.0000 mg | Freq: Once | INTRAVENOUS | Status: AC
  Administered 2020-11-06: 20 mg via INTRAVENOUS

## 2020-11-06 NOTE — Progress Notes (Signed)
Patient presents today for treatment. Labs pending. Patient denies pain today. Patient denies any significant changes since her last treatment. Heart rate elevated on arrival. 110. Recheck 87. Platelets 87 today.   Message received from Dr. Delton Coombes. Proceed with treatment message received.Labs reviewed by MD. Platelets reported. Carboplatin dose reduced to 190 mg.   Treatment given today per MD orders. Tolerated infusion without adverse affects. Vital signs stable. No complaints at this time. Discharged from clinic ambulatory in stable condition. Alert and oriented x 3. F/U with [facility] as scheduled.

## 2020-11-06 NOTE — Patient Instructions (Signed)
[facility] Discharge Instructions for Patients Receiving Chemotherapy  Today you received the following chemotherapy agents   To help prevent nausea and vomiting after your treatment, we encourage you to take your nausea medication   If you develop nausea and vomiting that is not controlled by your nausea medication, call the clinic.   BELOW ARE SYMPTOMS THAT SHOULD BE REPORTED IMMEDIATELY:  *FEVER GREATER THAN 100.5 F  *CHILLS WITH OR WITHOUT FEVER  NAUSEA AND VOMITING THAT IS NOT CONTROLLED WITH YOUR NAUSEA MEDICATION  *UNUSUAL SHORTNESS OF BREATH  *UNUSUAL BRUISING OR BLEEDING  TENDERNESS IN MOUTH AND THROAT WITH OR WITHOUT PRESENCE OF ULCERS  *URINARY PROBLEMS  *BOWEL PROBLEMS  UNUSUAL RASH Items with * indicate a potential emergency and should be followed up as soon as possible.  Feel free to call the clinic should you have any questions or concerns. The clinic phone number is [phone].  Please show the CHEMO ALERT CARD at check-in to the Emergency Department and triage nurse.   

## 2020-11-06 NOTE — Patient Instructions (Signed)
[facility] at [facility] Discharge Instructions  Labs drawn from portacath today   Thank you for choosing [facility] at [facility] to provide your oncology and hematology care.  To afford each patient quality time with our provider, please arrive at least 15 minutes before your scheduled appointment time.   If you have a lab appointment with the Cancer Center please come in thru the Main Entrance and check in at the main information desk.  You need to re-schedule your appointment should you arrive 10 or more minutes late.  We strive to give you quality time with our providers, and arriving late affects you and other patients whose appointments are after yours.  Also, if you no show three or more times for appointments you may be dismissed from the clinic at the providers discretion.     Again, thank you for choosing [facility].  Our hope is that these requests will decrease the amount of time that you wait before being seen by our physicians.       _____________________________________________________________  Should you have questions after your visit to [facility], please contact our office at [phone] and follow the prompts.  Our office hours are 8:00 a.m. and 4:30 p.m. Monday - Friday.  Please note that voicemails left after 4:00 p.m. may not be returned until the following business day.  We are closed weekends and major holidays.  You do have access to a nurse 24-7, just call the main number to the clinic [phone] and do not press any options, hold on the line and a nurse will answer the phone.    For prescription refill requests, have your pharmacy contact our office and allow 72 hours.    Due to Covid, you will need to wear a mask upon entering the hospital. If you do not have a mask, a mask will be given to you at the Main Entrance upon arrival. For doctor visits, patients may have 1 support person age 18  or older with them. For treatment visits, patients can not have anyone with them due to social distancing guidelines and our immunocompromised population.     

## 2020-11-13 ENCOUNTER — Inpatient Hospital Stay (HOSPITAL_COMMUNITY): Payer: BC Managed Care – PPO

## 2020-11-13 ENCOUNTER — Other Ambulatory Visit: Payer: Self-pay

## 2020-11-13 ENCOUNTER — Inpatient Hospital Stay (HOSPITAL_BASED_OUTPATIENT_CLINIC_OR_DEPARTMENT_OTHER): Payer: BC Managed Care – PPO | Admitting: Hematology

## 2020-11-13 VITALS — BP 120/68 | HR 96 | Resp 18

## 2020-11-13 VITALS — BP 124/67 | HR 99 | Temp 97.2°F | Resp 18 | Wt 160.8 lb

## 2020-11-13 DIAGNOSIS — C50919 Malignant neoplasm of unspecified site of unspecified female breast: Secondary | ICD-10-CM

## 2020-11-13 DIAGNOSIS — Z95828 Presence of other vascular implants and grafts: Secondary | ICD-10-CM

## 2020-11-13 DIAGNOSIS — Z5111 Encounter for antineoplastic chemotherapy: Secondary | ICD-10-CM | POA: Diagnosis not present

## 2020-11-13 LAB — CBC WITH DIFFERENTIAL/PLATELET
Abs Immature Granulocytes: 0.02 10*3/uL (ref 0.00–0.07)
Basophils Absolute: 0 10*3/uL (ref 0.0–0.1)
Basophils Relative: 0 %
Eosinophils Absolute: 0.1 10*3/uL (ref 0.0–0.5)
Eosinophils Relative: 3 %
HCT: 26.3 % — ABNORMAL LOW (ref 36.0–46.0)
Hemoglobin: 8.8 g/dL — ABNORMAL LOW (ref 12.0–15.0)
Immature Granulocytes: 1 %
Lymphocytes Relative: 36 %
Lymphs Abs: 0.9 10*3/uL (ref 0.7–4.0)
MCH: 30.2 pg (ref 26.0–34.0)
MCHC: 33.5 g/dL (ref 30.0–36.0)
MCV: 90.4 fL (ref 80.0–100.0)
Monocytes Absolute: 0.2 10*3/uL (ref 0.1–1.0)
Monocytes Relative: 7 %
Neutro Abs: 1.4 10*3/uL — ABNORMAL LOW (ref 1.7–7.7)
Neutrophils Relative %: 53 %
Platelets: 128 10*3/uL — ABNORMAL LOW (ref 150–400)
RBC: 2.91 MIL/uL — ABNORMAL LOW (ref 3.87–5.11)
RDW: 17.5 % — ABNORMAL HIGH (ref 11.5–15.5)
WBC: 2.6 10*3/uL — ABNORMAL LOW (ref 4.0–10.5)
nRBC: 0 % (ref 0.0–0.2)

## 2020-11-13 LAB — COMPREHENSIVE METABOLIC PANEL
ALT: 25 U/L (ref 0–44)
AST: 15 U/L (ref 15–41)
Albumin: 3.8 g/dL (ref 3.5–5.0)
Alkaline Phosphatase: 79 U/L (ref 38–126)
Anion gap: 8 (ref 5–15)
BUN: 17 mg/dL (ref 6–20)
CO2: 23 mmol/L (ref 22–32)
Calcium: 8.9 mg/dL (ref 8.9–10.3)
Chloride: 103 mmol/L (ref 98–111)
Creatinine, Ser: 0.82 mg/dL (ref 0.44–1.00)
GFR, Estimated: >60 mL/min (ref >60)
Glucose, Bld: 220 mg/dL — ABNORMAL HIGH (ref 70–99)
Potassium: 3.7 mmol/L (ref 3.5–5.1)
Sodium: 134 mmol/L — ABNORMAL LOW (ref 135–145)
Total Bilirubin: 0.5 mg/dL (ref 0.3–1.2)
Total Protein: 6.4 g/dL — ABNORMAL LOW (ref 6.5–8.1)

## 2020-11-13 MED ORDER — SODIUM CHLORIDE 0.9 % IV SOLN
10.0000 mg | Freq: Once | INTRAVENOUS | Status: AC
  Administered 2020-11-13: 10 mg via INTRAVENOUS
  Filled 2020-11-13: qty 10

## 2020-11-13 MED ORDER — SODIUM CHLORIDE 0.9 % IV SOLN
181.6500 mg | Freq: Once | INTRAVENOUS | Status: AC
  Administered 2020-11-13: 180 mg via INTRAVENOUS
  Filled 2020-11-13: qty 18

## 2020-11-13 MED ORDER — SODIUM CHLORIDE 0.9 % IV SOLN
80.0000 mg/m2 | Freq: Once | INTRAVENOUS | Status: AC
  Administered 2020-11-13: 138 mg via INTRAVENOUS
  Filled 2020-11-13: qty 23

## 2020-11-13 MED ORDER — DIPHENHYDRAMINE HCL 50 MG/ML IJ SOLN
INTRAMUSCULAR | Status: AC
  Filled 2020-11-13: qty 1

## 2020-11-13 MED ORDER — SODIUM CHLORIDE 0.9% FLUSH
10.0000 mL | INTRAVENOUS | Status: DC | PRN
  Administered 2020-11-13: 10 mL

## 2020-11-13 MED ORDER — PALONOSETRON HCL INJECTION 0.25 MG/5ML
INTRAVENOUS | Status: AC
  Filled 2020-11-13: qty 5

## 2020-11-13 MED ORDER — FAMOTIDINE IN NACL 20-0.9 MG/50ML-% IV SOLN
20.0000 mg | Freq: Once | INTRAVENOUS | Status: AC
  Administered 2020-11-13: 20 mg via INTRAVENOUS

## 2020-11-13 MED ORDER — SODIUM CHLORIDE 0.9 % IV SOLN: Freq: Once | INTRAVENOUS | Status: AC

## 2020-11-13 MED ORDER — PALONOSETRON HCL INJECTION 0.25 MG/5ML
0.2500 mg | Freq: Once | INTRAVENOUS | Status: AC
  Administered 2020-11-13: 0.25 mg via INTRAVENOUS

## 2020-11-13 MED ORDER — HEPARIN SOD (PORK) LOCK FLUSH 100 UNIT/ML IV SOLN
500.0000 [IU] | Freq: Once | INTRAVENOUS | Status: AC | PRN
  Administered 2020-11-13: 500 [IU]

## 2020-11-13 MED ORDER — DIPHENHYDRAMINE HCL 50 MG/ML IJ SOLN
50.0000 mg | Freq: Once | INTRAMUSCULAR | Status: AC
  Administered 2020-11-13: 50 mg via INTRAVENOUS

## 2020-11-13 NOTE — Progress Notes (Signed)
Patient was assessed by Dr. Katragadda and labs have been reviewed.  Patient is okay to proceed with treatment today. Primary RN and pharmacy aware.   

## 2020-11-13 NOTE — Patient Instructions (Signed)
[facility] Discharge Instructions for Patients Receiving Chemotherapy   Beginning January 23rd 2017 lab work for the Cancer Center will be done in the  Main lab at [place] on 1st floor. If you have a lab appointment with the Cancer Center please come in thru the  Main Entrance and check in at the main information desk   Today you received the following chemotherapy agents Carboplatin and Taxol  To help prevent nausea and vomiting after your treatment, we encourage you to take your nausea medication   If you develop nausea and vomiting, or diarrhea that is not controlled by your medication, call the clinic.  The clinic phone number is [phone]. Office hours are Monday-Friday 8:30am-5:00pm.  BELOW ARE SYMPTOMS THAT SHOULD BE REPORTED IMMEDIATELY:  *FEVER GREATER THAN 101.0 F  *CHILLS WITH OR WITHOUT FEVER  NAUSEA AND VOMITING THAT IS NOT CONTROLLED WITH YOUR NAUSEA MEDICATION  *UNUSUAL SHORTNESS OF BREATH  *UNUSUAL BRUISING OR BLEEDING  TENDERNESS IN MOUTH AND THROAT WITH OR WITHOUT PRESENCE OF ULCERS  *URINARY PROBLEMS  *BOWEL PROBLEMS  UNUSUAL RASH Items with * indicate a potential emergency and should be followed up as soon as possible. If you have an emergency after office hours please contact your primary care physician or go to the nearest emergency department.  Please call the clinic during office hours if you have any questions or concerns.   You may also contact the Patient Navigator at [phone] should you have any questions or need assistance in obtaining follow up care.      Resources For Cancer Patients and their Caregivers ? American Cancer Society: Can assist with transportation, wigs, general needs, runs Look Good Feel Better.        1-888-227-6333 ? Cancer Care: Provides financial assistance, online support groups, medication/co-pay assistance.  1-800-813-HOPE (4673) ? [facility] Assists  [place] cancer patients and their families through emotional , educational and financial support.  336-427-4357 ? [place] DSS Where to apply for food stamps, Medicaid and utility assistance. 336-342-1394 ? [facility]: Transportation to medical appointments. 336-347-2287 ? Social Security Administration: May apply for disability if have a Stage IV cancer. 336-342-7796 1-800-772-1213 ? [place] Aging, Disability and Transit Services: Assists with nutrition, care and transit needs. 336-349-2343          

## 2020-11-13 NOTE — Patient Instructions (Signed)
[place] at [facility] Discharge Instructions  You were seen today by Dr. Delton Coombes. He went over your recent results. You received your treatment today; continue getting treatment every week. Dr. Delton Coombes will see you back in 2 weeks for labs and follow up.   Thank you for choosing [place] at [facility] to provide your oncology and hematology care.  To afford each patient quality time with our provider, please arrive at least 15 minutes before your scheduled appointment time.   If you have a lab appointment with the [facility] please come in thru the Main Entrance and check in at the main information desk  You need to re-schedule your appointment should you arrive 10 or more minutes late.  We strive to give you quality time with our providers, and arriving late affects you and other patients whose appointments are after yours.  Also, if you no show three or more times for appointments you may be dismissed from the clinic at the providers discretion.     Again, thank you for choosing [facility].  Our hope is that these requests will decrease the amount of time that you wait before being seen by our physicians.       _____________________________________________________________  Should you have questions after your visit to [facility], please contact our office at [phone] between the hours of 8:00 a.m. and 4:30 p.m.  Voicemails left after 4:00 p.m. will not be returned until the following business day.  For prescription refill requests, have your pharmacy contact our office and allow 72 hours.    Cancer Center Support Programs:   > Cancer Support Group  2nd Tuesday of the month 1pm-2pm, Journey Room

## 2020-11-13 NOTE — Progress Notes (Signed)
Elizabeth Graves presents today for [employer]. Pt denies any new changes or symptoms since last treatment. Lab results, including ANC 1.4, and vitals have been reviewed and are stable and within parameters for treatment. Patient has been assessed by Dr. Delton Coombes who has approved proceeding with treatment today as planned.  Infusions tolerated without incident or complaint. VSS upon completion of treatment. Port flushed and deaccessed per protocol, see MAR and IV flowsheet for details. Discharged in satisfactory condition with follow up instructions.

## 2020-11-13 NOTE — Progress Notes (Signed)
[place], [place] [address]   CLINIC:  Medical Oncology/Hematology  PCP:  Kathyrn Drown, MD [address] [place] / [place] [address] [phone]   REASON FOR VISIT:  Follow-up for left breast cancer  PRIOR THERAPY: AC x 4 cycles from 08/01/2020 to 09/15/2020  NGS Results: Not done  CURRENT THERAPY: Carboplatin & paclitaxel weekly  BRIEF ONCOLOGIC HISTORY:  Oncology History  Triple negative malignant neoplasm of breast ([place])  07/05/2020 Initial Diagnosis   Triple negative malignant neoplasm of breast ([place])   08/01/2020 -  Chemotherapy   The patient had DOXOrubicin (ADRIAMYCIN) chemo injection 104 mg, 60 mg/m2 = 104 mg, Intravenous,  Once, 4 of 4 cycles Administration: 104 mg (08/01/2020), 104 mg (08/14/2020), 104 mg (09/01/2020), 104 mg (09/15/2020) palonosetron (ALOXI) injection 0.25 mg, 0.25 mg, Intravenous,  Once, 6 of 8 cycles Administration: 0.25 mg (08/01/2020), 0.25 mg (10/02/2020), 0.25 mg (08/14/2020), 0.25 mg (09/01/2020), 0.25 mg (09/15/2020), 0.25 mg (10/23/2020) pegfilgrastim (NEULASTA ONPRO KIT) injection 6 mg, 6 mg, Subcutaneous, Once, 1 of 1 cycle Administration: 6 mg (09/01/2020) pegfilgrastim-jmdb (FULPHILA) injection 6 mg, 6 mg, Subcutaneous,  Once, 3 of 3 cycles Administration: 6 mg (08/03/2020), 6 mg (08/16/2020), 6 mg (09/18/2020) CARBOplatin (PARAPLATIN) 240 mg in sodium chloride 0.9 % 250 mL chemo infusion, 240 mg (98.1 % of original dose 247 mg), Intravenous,  Once, 2 of 4 cycles Dose modification:   (original dose 247 mg, Cycle 5, Reason: Provider Judgment),   (original dose 247 mg, Cycle 6, Reason: Provider Judgment) Administration: 240 mg (10/02/2020), 240 mg (10/09/2020), 230 mg (10/23/2020), 230 mg (10/30/2020), 190 mg (11/06/2020), 210 mg (10/16/2020) cyclophosphamide (CYTOXAN) 1,040 mg in sodium chloride 0.9 % 250 mL chemo infusion, 600 mg/m2 = 1,040 mg, Intravenous,  Once, 4 of 4 cycles Administration: 1,040 mg (08/01/2020),  1,040 mg (08/14/2020), 1,040 mg (09/01/2020), 1,040 mg (09/15/2020) PACLitaxel (TAXOL) 138 mg in sodium chloride 0.9 % 250 mL chemo infusion (</= 44m/m2), 80 mg/m2 = 138 mg, Intravenous,  Once, 2 of 4 cycles Administration: 138 mg (10/02/2020), 138 mg (10/09/2020), 138 mg (10/23/2020), 138 mg (10/30/2020), 138 mg (11/06/2020), 138 mg (10/16/2020) fosaprepitant (EMEND) 150 mg in sodium chloride 0.9 % 145 mL IVPB, 150 mg, Intravenous,  Once, 4 of 4 cycles Administration: 150 mg (08/01/2020), 150 mg (08/14/2020), 150 mg (09/01/2020), 150 mg (09/15/2020)  for chemotherapy treatment.    08/17/2020 Genetic Testing   Negative genetic testing:  No pathogenic variants detected on the [facility] CustomNext-Cancer + RNAinsight panel. A variant of uncertain significance (VUS) was detected in the MSH2 gene called c.1600C>T (p.R534C). The report date is 08/17/2020.  The CustomNext-Cancer+RNAinsight panel offered by [employer] [facility] sequencing and rearrangement analysis for up to 91 genes, which included the following 47 genes for Ms. Kirksey:  APC*, ATM*, AXIN2, BARD1, BMPR1A, BRCA1*, BRCA2*, BRIP1*, CDH1*, CDK4, CDKN2A, CHEK2*, DICER1, EPCAM, GREM1, HOXB13, MEN1, MLH1*, MSH2*, MSH3, MSH6*, MUTYH*, NBN, NF1*, NF2, NTHL1, PALB2*, PMS2*, POLD1, POLE, PTEN*, RAD51C*, RAD51D*, RECQL, RET, SDHA, SDHAF2, SDHB, SDHC, SDHD, SMAD4, SMARCA4, STK11, TP53*, TSC1, TSC2, and VHL.  DNA and RNA analyses performed for * genes.       CANCER STAGING: Cancer Staging No matching staging information was found for the patient.  INTERVAL HISTORY:  Elizabeth Graves a 54y.o. female, returns for routine follow-up and consideration for next cycle of chemotherapy. Elizabeth Graves was last seen on 10/30/2020.  Due for cycle #7 of carboplatin and paclitaxel today.   Overall, she  tells me she has been feeling pretty well. She tolerated the previous treatment well and reports having mild fatigue and numbness in her hands if she leans on them, otherwise  stable; she denies having N/V/D. She denies numbness in her feet or imbalance. Her appetite is around 75% and alternates daily.  Overall, she feels ready for next cycle of chemo today.    REVIEW OF SYSTEMS:  Review of Systems  Constitutional: Positive for appetite change (75%) and fatigue (50%).  Gastrointestinal: Negative for diarrhea, nausea and vomiting.  Neurological: Positive for numbness (hands & fingers). Negative for dizziness.  All other systems reviewed and are negative.   PAST MEDICAL/SURGICAL HISTORY:  Past Medical History:  Diagnosis Date  . Asthma   . Cancer ([facility])    cervical - 20 yrs ago  . Carpal tunnel syndrome   . Depression   . Family history of breast cancer   . Family history of esophageal cancer   . Family history of lung cancer   . Family history of throat cancer   . Hypertension   . Hypokalemia    tx w/ k-Dur  . Port-A-Cath in place 07/24/2020  . Seasonal allergies    tx with benadryl prn  . SVD (spontaneous vaginal delivery)    x 2   Past Surgical History:  Procedure Laterality Date  . ABDOMINAL HYSTERECTOMY    . CO2 Laser of Cervix     for cervical cancer 20 yrs ago  . DILATION AND CURETTAGE OF UTERUS  12/2010   polyp removed  . DILATION AND CURETTAGE OF UTERUS     x 3 for MAB  . LAPAROSCOPIC ASSISTED VAGINAL HYSTERECTOMY  06/09/2012   Procedure: LAPAROSCOPIC ASSISTED VAGINAL HYSTERECTOMY;  Surgeon: Gus Height, MD;  Location: [facility];  Service: Gynecology;  Laterality: N/A;  . PORTACATH PLACEMENT Right 07/17/2020   Procedure: INSERTION PORT-A-CATH;  Surgeon: Virl Cagey, MD;  Location: [facility];  Service: General;  Laterality: Right;  . SALPINGOOPHORECTOMY  06/09/2012   Procedure: SALPINGO OOPHERECTOMY;  Surgeon: Gus Height, MD;  Location: [facility];  Service: Gynecology;  Laterality: Bilateral;  . TUBAL LIGATION      SOCIAL HISTORY:  Social History   Socioeconomic History  . Marital status: Married    Spouse name: Not on file  . Number  of children: 2  . Years of education: Not on file  . Highest education level: Not on file  Occupational History  . Occupation: EMPLOYED  Tobacco Use  . Smoking status: Former Smoker    Packs/day: 1.00    Years: 16.00    Pack years: 16.00    Types: Cigarettes    Start date: 05/30/1994    Quit date: 01/31/2020    Years since quitting: 0.7  . Smokeless tobacco: Never Used  Vaping Use  . Vaping Use: Never used  Substance and Sexual Activity  . Alcohol use: No  . Drug use: No  . Sexual activity: Yes    Birth control/protection: Surgical  Other Topics Concern  . Not on file  Social History Narrative  . Not on file   Social Determinants of Health   Financial Resource Strain: Low Risk   . Difficulty of Paying Living Expenses: Not very hard  Food Insecurity: No Food Insecurity  . Worried About Charity fundraiser in the Last Year: Never true  . Ran Out of Food in the Last Year: Never true  Transportation Needs: No Transportation Needs  . Lack of Transportation (Medical): No  .  Lack of Transportation (Non-Medical): No  Physical Activity: Inactive  . Days of Exercise per Week: 0 days  . Minutes of Exercise per Session: 0 min  Stress: Stress Concern Present  . Feeling of Stress : To some extent  Social Connections: Moderately Isolated  . Frequency of Communication with Friends and Family: Three times a week  . Frequency of Social Gatherings with Friends and Family: Once a week  . Attends Religious Services: Never  . Active Member of Clubs or Organizations: No  . Attends Archivist Meetings: Never  . Marital Status: Married  Human resources officer Violence: Not At Risk  . Fear of Current or Ex-Partner: No  . Emotionally Abused: No  . Physically Abused: No  . Sexually Abused: No    FAMILY HISTORY:  Family History  Problem Relation Age of Onset  . Heart disease Mother   . Lung cancer Mother 4       smoker  . Heart disease Father   . Esophageal cancer Father        GE  junction, dx. in his early 40s  . Diverticulitis Sister   . Diabetes Sister   . Diabetes Maternal Grandmother   . Heart disease Maternal Grandmother   . Throat cancer Maternal Grandmother        dx. late 57s  . Healthy Sister   . Healthy Daughter   . Healthy Son   . Breast cancer Paternal Aunt        limited info    CURRENT MEDICATIONS:  Current Outpatient Medications  Medication Sig Dispense Refill  . albuterol (VENTOLIN HFA) 108 (90 Base) MCG/ACT inhaler TAKE 2 PUFFS BY MOUTH EVERY 6 HOURS AS NEEDED FOR WHEEZE OR SHORTNESS OF BREATH 8.5 Inhaler 0  . atorvastatin (LIPITOR) 20 MG tablet TAKE 1 TABLET BY MOUTH EVERY DAY 90 tablet 1  . CYCLOPHOSPHAMIDE IV Inject into the vein every 14 (fourteen) days.    . diphenhydrAMINE (BENADRYL) 25 mg capsule Take 25 mg by mouth every 6 (six) hours as needed for allergies. Allergies.    Marland Kitchen DOXOrubicin HCl (ADRIAMYCIN IV) Inject into the vein every 14 (fourteen) days.    Marland Kitchen esomeprazole (NEXIUM) 20 MG capsule Take 20 mg by mouth daily before breakfast.    . ibuprofen (ADVIL,MOTRIN) 200 MG tablet Take 400-600 mg by mouth every 8 (eight) hours as needed for moderate pain (pain.).     Marland Kitchen lisinopril (ZESTRIL) 5 MG tablet TAKE 1 TABLET BY MOUTH EVERY DAY 90 tablet 1  . naproxen sodium (ALEVE) 220 MG tablet Take 440 mg by mouth 3 (three) times daily as needed (pain.).     Marland Kitchen pegfilgrastim-jmdb (FULPHILA) 6 MG/0.6ML injection Inject 6 mg into the skin every 14 (fourteen) days.    . potassium chloride (KLOR-CON) 10 MEQ tablet Take 1 tablet (10 mEq total) by mouth daily. 30 tablet 2  . sertraline (ZOLOFT) 50 MG tablet TAKE 1 TABLET BY MOUTH EVERY DAY 90 tablet 0  . lidocaine-prilocaine (EMLA) cream Apply a pea sized amount to port a cath site and cover with plastic wrap 1 hour prior to chemotherapy appointments (Patient not taking: Reported on 11/13/2020) 30 g 3  . prochlorperazine (COMPAZINE) 10 MG tablet Take 1 tablet (10 mg total) by mouth every 6 (six) hours as  needed (Nausea or vomiting). (Patient not taking: Reported on 11/13/2020) 30 tablet 1   No current facility-administered medications for this visit.    ALLERGIES:  No Known Allergies  PHYSICAL EXAM:  Performance  status (ECOG): 0 - Asymptomatic  Vitals:   11/13/20 0831  BP: 124/67  Pulse: 99  Resp: 18  Temp: (!) 97.2 F (36.2 C)  SpO2: 99%   Wt Readings from Last 3 Encounters:  11/13/20 160 lb 12.8 oz (72.9 kg)  11/06/20 160 lb (72.6 kg)  10/30/20 162 lb 3.2 oz (73.6 kg)   Physical Exam Vitals reviewed.  Constitutional:      Appearance: Normal appearance.  Cardiovascular:     Rate and Rhythm: Normal rate and regular rhythm.     Pulses: Normal pulses.     Heart sounds: Normal heart sounds.  Pulmonary:     Effort: Pulmonary effort is normal.     Breath sounds: Normal breath sounds.  Chest:     Breasts:        Left: Inverted nipple (mild) present. No mass.  Lymphadenopathy:     Upper Body:     Left upper body: No axillary or pectoral adenopathy.  Neurological:     General: No focal deficit present.     Mental Status: She is alert and oriented to person, place, and time.  Psychiatric:        Mood and Affect: Mood normal.        Behavior: Behavior normal.     LABORATORY DATA:  I have reviewed the labs as listed.  CBC Latest Ref Rng & Units 11/13/2020 11/06/2020 10/30/2020  WBC 4.0 - 10.5 K/uL 2.6(L) 3.5(L) 4.6  Hemoglobin 12.0 - 15.0 g/dL 8.8(L) 8.9(L) 9.2(L)  Hematocrit 36 - 46 % 26.3(L) 26.6(L) 28.2(L)  Platelets 150 - 400 K/uL 128(L) 87(L) 117(L)   CMP Latest Ref Rng & Units 11/13/2020 11/06/2020 10/30/2020  Glucose 70 - 99 mg/dL 220(H) 179(H) 216(H)  BUN 6 - 20 mg/dL _0 Creatinine 0.44 - 1.00 mg/dL 0.82 0.79 0.76  Sodium 135 - 145 mmol/L 134(L) 136 133(L)  Potassium 3.5 - 5.1 mmol/L 3.7 3.7 3.7  Chloride 98 - 111 mmol/L 103 106 103  CO2 22 - 32 mmol/L _1 Calcium 8.9 - 10.3 mg/dL 8.9 8.5(L) 8.8(L)  Total Protein 6.5 - 8.1 g/dL 6.4(L) 6.1(L)  6.2(L)  Total Bilirubin 0.3 - 1.2 mg/dL 0.5 0.5 0.5  Alkaline Phos 38 - 126 U/L 79 64 62  AST 15 - 41 U/L 15 16 14(L)  ALT 0 - 44 U/L _2 DIAGNOSTIC IMAGING:  I have independently reviewed the scans and discussed with the patient. No results found.   ASSESSMENT:  1. Triple negative left breast cancer (T2N1): -Patient identified left breast mass with left nipple retraction about 2 months ago. -Mammogram on 06/13/2020 showed mass in the retroareolar 12 o'clock position in the left breast. 2 suspicious left axillary lymph nodes, largest measuring 2 cm. -Breast ultrasound showed 2.7 x 1.6 x 2.2 cm mass in the 12:00 retroareolar position. Just lateral to the mass is a calcified hypoechoic nodule that corresponds to densely calcified degenerating fibroadenoma. -Left breast 12:00 biopsy showed invasive ductal carcinoma, grade 2/3. Left axillary lymph node biopsy was consistent with meta stasis. -ER negative, PR negative and Ki-67 15%. HER-2 equivocal by IHC. HER-2 FISH negative. -MRI of the breast on 07/19/2020 shows 4.2 cm enhancing mass in the subareolar left breast, enhancement extending to the level of the left nipple with associated nipple retraction and periareolar skin thickening. 6 morphologically abnormal level 1 left axillary lymph nodes. No evidence of malignancy in the right breast. -CT CAP on 07/14/2020 shows  dominant left axillary nodes measuring 1.8 cm short axis. Small left subpectoral nodes measuring up to 7 mm short axis, suspicious. Small mediastinal lymph nodes measuring 7 to 9 mm, thought to be reactive. No other evidence of metastatic disease. -NeoadjuvantDose dense ACstarted on 08/01/2020.  2. Family history: -Father had gastroesophageal junction cancer. Maternal grandmother had throat cancer and paternal aunt had breast cancer. -[facility] genetics testing showed MSH2VUS.  3. High risk drug monitoring: -Echo on 07/12/2020 shows EF 60 to 65%.   PLAN:  1.  T2N1 left breast TNBC: -She has completed 6 weekly dose of paclitaxel and carboplatin. -She reported slight tiredness which is stable.  Denies any signs of neuropathy. -Reviewed labs which showed normal LFTs.  CBC shows white count 2.6 with ANC around 1400.  Platelet count is 128. -She will proceed with carboplatin AUC 1.5 and Taxol 80 mg per metered square today and in 1 week.  She will come back to clinic in 2 weeks. -Physical examination today did not show any palpable breast mass.  Left breast nipple is no longer inverted.  2. Family history: -Genetic testing did not show any pathological mutations.  3. Hypokalemia: -Continue potassium 10 mEq daily.  Potassium today is 3.7.   Orders placed this encounter:  No orders of the defined types were placed in this encounter.    Derek Jack, MD [place] [phone]   I, Milinda Antis, am acting as a scribe for Dr. Sanda Linger.  I, Derek Jack MD, have reviewed the above documentation for accuracy and completeness, and I agree with the above.

## 2020-11-21 ENCOUNTER — Inpatient Hospital Stay (HOSPITAL_COMMUNITY): Payer: BC Managed Care – PPO

## 2020-11-21 ENCOUNTER — Other Ambulatory Visit: Payer: Self-pay

## 2020-11-21 DIAGNOSIS — C50919 Malignant neoplasm of unspecified site of unspecified female breast: Secondary | ICD-10-CM

## 2020-11-21 DIAGNOSIS — Z5111 Encounter for antineoplastic chemotherapy: Secondary | ICD-10-CM | POA: Diagnosis not present

## 2020-11-21 DIAGNOSIS — Z95828 Presence of other vascular implants and grafts: Secondary | ICD-10-CM

## 2020-11-21 LAB — CBC WITH DIFFERENTIAL/PLATELET
Band Neutrophils: 4 %
Basophils Absolute: 0.1 10*3/uL (ref 0.0–0.1)
Basophils Relative: 2 %
Eosinophils Absolute: 0.1 10*3/uL (ref 0.0–0.5)
Eosinophils Relative: 2 %
HCT: 25.1 % — ABNORMAL LOW (ref 36.0–46.0)
Hemoglobin: 8.4 g/dL — ABNORMAL LOW (ref 12.0–15.0)
Lymphocytes Relative: 39 %
Lymphs Abs: 1.1 10*3/uL (ref 0.7–4.0)
MCH: 31.3 pg (ref 26.0–34.0)
MCHC: 33.5 g/dL (ref 30.0–36.0)
MCV: 93.7 fL (ref 80.0–100.0)
Metamyelocytes Relative: 4 %
Monocytes Absolute: 0.2 10*3/uL (ref 0.1–1.0)
Monocytes Relative: 7 %
Myelocytes: 3 %
Neutro Abs: 1.1 10*3/uL — ABNORMAL LOW (ref 1.7–7.7)
Neutrophils Relative %: 38 %
Platelets: 199 10*3/uL (ref 150–400)
Promyelocytes Relative: 1 %
RBC: 2.68 MIL/uL — ABNORMAL LOW (ref 3.87–5.11)
RDW: 18.6 % — ABNORMAL HIGH (ref 11.5–15.5)
WBC: 2.7 10*3/uL — ABNORMAL LOW (ref 4.0–10.5)
nRBC: 1.8 % — ABNORMAL HIGH (ref 0.0–0.2)

## 2020-11-21 LAB — COMPREHENSIVE METABOLIC PANEL
ALT: 25 U/L (ref 0–44)
AST: 17 U/L (ref 15–41)
Albumin: 3.7 g/dL (ref 3.5–5.0)
Alkaline Phosphatase: 71 U/L (ref 38–126)
Anion gap: 9 (ref 5–15)
BUN: 17 mg/dL (ref 6–20)
CO2: 24 mmol/L (ref 22–32)
Calcium: 8.8 mg/dL — ABNORMAL LOW (ref 8.9–10.3)
Chloride: 102 mmol/L (ref 98–111)
Creatinine, Ser: 0.94 mg/dL (ref 0.44–1.00)
GFR, Estimated: >60 mL/min (ref >60)
Glucose, Bld: 234 mg/dL — ABNORMAL HIGH (ref 70–99)
Potassium: 3.4 mmol/L — ABNORMAL LOW (ref 3.5–5.1)
Sodium: 135 mmol/L (ref 135–145)
Total Bilirubin: 0.4 mg/dL (ref 0.3–1.2)
Total Protein: 6.6 g/dL (ref 6.5–8.1)

## 2020-11-21 MED ORDER — HEPARIN SOD (PORK) LOCK FLUSH 100 UNIT/ML IV SOLN
500.0000 [IU] | Freq: Once | INTRAVENOUS | Status: AC
  Administered 2020-11-21: 500 [IU] via INTRAVENOUS

## 2020-11-21 MED ORDER — SODIUM CHLORIDE 0.9% FLUSH
10.0000 mL | Freq: Once | INTRAVENOUS | Status: AC
  Administered 2020-11-21: 10 mL via INTRAVENOUS

## 2020-11-21 NOTE — Patient Instructions (Signed)
[facility] Discharge Instructions for Patients  Today your treatment was held   To help prevent nausea and vomiting after your treatment, we encourage you to take your nausea medication If you develop nausea and vomiting that is not controlled by your nausea medication, call the clinic.   BELOW ARE SYMPTOMS THAT SHOULD BE REPORTED IMMEDIATELY:  *FEVER GREATER THAN 100.5 F  *CHILLS WITH OR WITHOUT FEVER  NAUSEA AND VOMITING THAT IS NOT CONTROLLED WITH YOUR NAUSEA MEDICATION  *UNUSUAL SHORTNESS OF BREATH  *UNUSUAL BRUISING OR BLEEDING  TENDERNESS IN MOUTH AND THROAT WITH OR WITHOUT PRESENCE OF ULCERS  *URINARY PROBLEMS  *BOWEL PROBLEMS  UNUSUAL RASH Items with * indicate a potential emergency and should be followed up as soon as possible.  Feel free to call the clinic should you have any questions or concerns. The clinic phone number is [phone].  Please show the [facility] at check-in to the Emergency Department and triage nurse.

## 2020-11-21 NOTE — Progress Notes (Signed)
Patient presents today for treatment.  Vital signs within parameters.  No new complaints.  Labs pending.  ANC 1.1 and WBC 2.7,  Message sent to Dr.Katragadda to review.  Dr. Delton Coombes  Held treatment for today.  Explain the reason for the hold on today's treatment and plans to recheck labs next week.  Patient expressed concern that her treatments would be off schedule.  Explained that low counts sometime happens with treatment and offered to get Dr. Delton Coombes to speak with the patient.  Patient declined and expressed understanding.  Patient deaccessed.  Discharged home in stable, ambulatory condition.

## 2020-11-27 ENCOUNTER — Inpatient Hospital Stay (HOSPITAL_COMMUNITY): Payer: BC Managed Care – PPO

## 2020-11-27 ENCOUNTER — Other Ambulatory Visit: Payer: Self-pay

## 2020-11-27 ENCOUNTER — Other Ambulatory Visit (HOSPITAL_COMMUNITY): Payer: BC Managed Care – PPO

## 2020-11-27 ENCOUNTER — Encounter (HOSPITAL_COMMUNITY): Payer: Self-pay

## 2020-11-27 DIAGNOSIS — Z95828 Presence of other vascular implants and grafts: Secondary | ICD-10-CM

## 2020-11-27 DIAGNOSIS — C50919 Malignant neoplasm of unspecified site of unspecified female breast: Secondary | ICD-10-CM

## 2020-11-27 DIAGNOSIS — Z5111 Encounter for antineoplastic chemotherapy: Secondary | ICD-10-CM | POA: Diagnosis not present

## 2020-11-27 LAB — CBC WITH DIFFERENTIAL/PLATELET
Abs Immature Granulocytes: 0.05 10*3/uL (ref 0.00–0.07)
Basophils Absolute: 0 10*3/uL (ref 0.0–0.1)
Basophils Relative: 1 %
Eosinophils Absolute: 0.1 10*3/uL (ref 0.0–0.5)
Eosinophils Relative: 1 %
HCT: 28.8 % — ABNORMAL LOW (ref 36.0–46.0)
Hemoglobin: 9.3 g/dL — ABNORMAL LOW (ref 12.0–15.0)
Immature Granulocytes: 1 %
Lymphocytes Relative: 35 %
Lymphs Abs: 1.4 10*3/uL (ref 0.7–4.0)
MCH: 31.3 pg (ref 26.0–34.0)
MCHC: 32.3 g/dL (ref 30.0–36.0)
MCV: 97 fL (ref 80.0–100.0)
Monocytes Absolute: 0.6 10*3/uL (ref 0.1–1.0)
Monocytes Relative: 16 %
Neutro Abs: 1.8 10*3/uL (ref 1.7–7.7)
Neutrophils Relative %: 46 %
Platelets: 240 10*3/uL (ref 150–400)
RBC: 2.97 MIL/uL — ABNORMAL LOW (ref 3.87–5.11)
RDW: 20.5 % — ABNORMAL HIGH (ref 11.5–15.5)
WBC: 3.9 10*3/uL — ABNORMAL LOW (ref 4.0–10.5)
nRBC: 0 % (ref 0.0–0.2)

## 2020-11-27 MED ORDER — HEPARIN SOD (PORK) LOCK FLUSH 100 UNIT/ML IV SOLN
500.0000 [IU] | Freq: Once | INTRAVENOUS | Status: AC
  Administered 2020-11-27: 500 [IU] via INTRAVENOUS

## 2020-11-27 MED ORDER — SODIUM CHLORIDE 0.9% FLUSH
20.0000 mL | INTRAVENOUS | Status: DC | PRN
  Administered 2020-11-27: 20 mL via INTRAVENOUS

## 2020-11-27 NOTE — Patient Instructions (Signed)
[facility] Discharge Instructions  Labs drawn from portacath today   Thank you for choosing [facility] at [facility] to provide your oncology and hematology care.  To afford each patient quality time with our provider, please arrive at least 15 minutes before your scheduled appointment time.   If you have a lab appointment with the Cancer Center please come in thru the Main Entrance and check in at the main information desk.  You need to re-schedule your appointment should you arrive 10 or more minutes late.  We strive to give you quality time with our providers, and arriving late affects you and other patients whose appointments are after yours.  Also, if you no show three or more times for appointments you may be dismissed from the clinic at the providers discretion.     Again, thank you for choosing [facility].  Our hope is that these requests will decrease the amount of time that you wait before being seen by our physicians.       _____________________________________________________________  Should you have questions after your visit to [facility], please contact our office at [phone] and follow the prompts.  Our office hours are 8:00 a.m. and 4:30 p.m. Monday - Friday.  Please note that voicemails left after 4:00 p.m. may not be returned until the following business day.  We are closed weekends and major holidays.  You do have access to a nurse 24-7, just call the main number to the clinic [phone] and do not press any options, hold on the line and a nurse will answer the phone.    For prescription refill requests, have your pharmacy contact our office and allow 72 hours.    Due to Covid, you will need to wear a mask upon entering the hospital. If you do not have a mask, a mask will be given to you at the Main Entrance upon arrival. For doctor visits, patients may have 1 support person age 18  or older with them. For treatment visits, patients can not have anyone with them due to social distancing guidelines and our immunocompromised population.     

## 2020-11-27 NOTE — Progress Notes (Signed)
Labs reviewed with Dr. Delton Coombes. [place] 108 today. Patient may go and return on Wednesday for follow up and treatment verbal order Dr. Delton Coombes.

## 2020-11-27 NOTE — Progress Notes (Signed)
Port left accessed,saline locked and flushed for use on Wednesday 11/29/20

## 2020-11-29 ENCOUNTER — Encounter (HOSPITAL_COMMUNITY): Payer: Self-pay

## 2020-11-29 ENCOUNTER — Inpatient Hospital Stay (HOSPITAL_COMMUNITY): Payer: BC Managed Care – PPO | Attending: Hematology

## 2020-11-29 ENCOUNTER — Inpatient Hospital Stay (HOSPITAL_COMMUNITY): Payer: BC Managed Care – PPO

## 2020-11-29 ENCOUNTER — Other Ambulatory Visit: Payer: Self-pay

## 2020-11-29 ENCOUNTER — Inpatient Hospital Stay (HOSPITAL_BASED_OUTPATIENT_CLINIC_OR_DEPARTMENT_OTHER): Payer: BC Managed Care – PPO | Admitting: Hematology

## 2020-11-29 VITALS — BP 135/73 | HR 93 | Temp 97.2°F | Resp 18

## 2020-11-29 VITALS — BP 136/69 | HR 107 | Temp 97.3°F | Resp 18 | Wt 163.7 lb

## 2020-11-29 DIAGNOSIS — Z800 Family history of malignant neoplasm of digestive organs: Secondary | ICD-10-CM | POA: Insufficient documentation

## 2020-11-29 DIAGNOSIS — C50012 Malignant neoplasm of nipple and areola, left female breast: Secondary | ICD-10-CM | POA: Diagnosis present

## 2020-11-29 DIAGNOSIS — Z87891 Personal history of nicotine dependence: Secondary | ICD-10-CM | POA: Insufficient documentation

## 2020-11-29 DIAGNOSIS — Z17421 Hormone receptor negative with human epidermal growth factor receptor 2 negative status: Secondary | ICD-10-CM

## 2020-11-29 DIAGNOSIS — Z8541 Personal history of malignant neoplasm of cervix uteri: Secondary | ICD-10-CM | POA: Insufficient documentation

## 2020-11-29 DIAGNOSIS — N6453 Retraction of nipple: Secondary | ICD-10-CM | POA: Diagnosis not present

## 2020-11-29 DIAGNOSIS — Z5111 Encounter for antineoplastic chemotherapy: Secondary | ICD-10-CM | POA: Insufficient documentation

## 2020-11-29 DIAGNOSIS — Z95828 Presence of other vascular implants and grafts: Secondary | ICD-10-CM

## 2020-11-29 DIAGNOSIS — Z5189 Encounter for other specified aftercare: Secondary | ICD-10-CM | POA: Insufficient documentation

## 2020-11-29 DIAGNOSIS — Z171 Estrogen receptor negative status [ER-]: Secondary | ICD-10-CM | POA: Insufficient documentation

## 2020-11-29 DIAGNOSIS — E876 Hypokalemia: Secondary | ICD-10-CM | POA: Diagnosis not present

## 2020-11-29 DIAGNOSIS — Z803 Family history of malignant neoplasm of breast: Secondary | ICD-10-CM | POA: Diagnosis not present

## 2020-11-29 DIAGNOSIS — C773 Secondary and unspecified malignant neoplasm of axilla and upper limb lymph nodes: Secondary | ICD-10-CM | POA: Diagnosis not present

## 2020-11-29 DIAGNOSIS — C50919 Malignant neoplasm of unspecified site of unspecified female breast: Secondary | ICD-10-CM

## 2020-11-29 DIAGNOSIS — Z801 Family history of malignant neoplasm of trachea, bronchus and lung: Secondary | ICD-10-CM | POA: Insufficient documentation

## 2020-11-29 LAB — CBC WITH DIFFERENTIAL/PLATELET
Abs Immature Granulocytes: 0.07 10*3/uL (ref 0.00–0.07)
Basophils Absolute: 0.1 10*3/uL (ref 0.0–0.1)
Basophils Relative: 1 %
Eosinophils Absolute: 0.1 10*3/uL (ref 0.0–0.5)
Eosinophils Relative: 2 %
HCT: 29.8 % — ABNORMAL LOW (ref 36.0–46.0)
Hemoglobin: 9.6 g/dL — ABNORMAL LOW (ref 12.0–15.0)
Immature Granulocytes: 2 %
Lymphocytes Relative: 29 %
Lymphs Abs: 1.2 10*3/uL (ref 0.7–4.0)
MCH: 31.8 pg (ref 26.0–34.0)
MCHC: 32.2 g/dL (ref 30.0–36.0)
MCV: 98.7 fL (ref 80.0–100.0)
Monocytes Absolute: 0.6 10*3/uL (ref 0.1–1.0)
Monocytes Relative: 14 %
Neutro Abs: 2.2 10*3/uL (ref 1.7–7.7)
Neutrophils Relative %: 52 %
Platelets: 234 10*3/uL (ref 150–400)
RBC: 3.02 MIL/uL — ABNORMAL LOW (ref 3.87–5.11)
RDW: 20.3 % — ABNORMAL HIGH (ref 11.5–15.5)
WBC: 4.2 10*3/uL (ref 4.0–10.5)
nRBC: 0 % (ref 0.0–0.2)

## 2020-11-29 LAB — COMPREHENSIVE METABOLIC PANEL
ALT: 32 U/L (ref 0–44)
AST: 24 U/L (ref 15–41)
Albumin: 3.8 g/dL (ref 3.5–5.0)
Alkaline Phosphatase: 76 U/L (ref 38–126)
Anion gap: 8 (ref 5–15)
BUN: 11 mg/dL (ref 6–20)
CO2: 22 mmol/L (ref 22–32)
Calcium: 8.8 mg/dL — ABNORMAL LOW (ref 8.9–10.3)
Chloride: 101 mmol/L (ref 98–111)
Creatinine, Ser: 0.83 mg/dL (ref 0.44–1.00)
GFR, Estimated: >60 mL/min (ref >60)
Glucose, Bld: 335 mg/dL — ABNORMAL HIGH (ref 70–99)
Potassium: 3.7 mmol/L (ref 3.5–5.1)
Sodium: 131 mmol/L — ABNORMAL LOW (ref 135–145)
Total Bilirubin: 0.4 mg/dL (ref 0.3–1.2)
Total Protein: 6.8 g/dL (ref 6.5–8.1)

## 2020-11-29 MED ORDER — SODIUM CHLORIDE 0.9 % IV SOLN
20.0000 mg | Freq: Once | INTRAVENOUS | Status: AC
  Administered 2020-11-29: 20 mg via INTRAVENOUS
  Filled 2020-11-29: qty 20

## 2020-11-29 MED ORDER — SODIUM CHLORIDE 0.9 % IV SOLN: Freq: Once | INTRAVENOUS | Status: AC

## 2020-11-29 MED ORDER — SODIUM CHLORIDE 0.9% FLUSH
10.0000 mL | INTRAVENOUS | Status: DC | PRN
  Administered 2020-11-29: 10 mL

## 2020-11-29 MED ORDER — SODIUM CHLORIDE 0.9 % IV SOLN
80.0000 mg/m2 | Freq: Once | INTRAVENOUS | Status: AC
  Administered 2020-11-29: 138 mg via INTRAVENOUS
  Filled 2020-11-29: qty 23

## 2020-11-29 MED ORDER — HEPARIN SOD (PORK) LOCK FLUSH 100 UNIT/ML IV SOLN
500.0000 [IU] | Freq: Once | INTRAVENOUS | Status: AC | PRN
  Administered 2020-11-29: 500 [IU]

## 2020-11-29 MED ORDER — DIPHENHYDRAMINE HCL 50 MG/ML IJ SOLN
50.0000 mg | Freq: Once | INTRAMUSCULAR | Status: AC
  Administered 2020-11-29: 50 mg via INTRAVENOUS
  Filled 2020-11-29: qty 1

## 2020-11-29 MED ORDER — FAMOTIDINE IN NACL 20-0.9 MG/50ML-% IV SOLN
20.0000 mg | Freq: Once | INTRAVENOUS | Status: AC
  Administered 2020-11-29: 20 mg via INTRAVENOUS
  Filled 2020-11-29: qty 50

## 2020-11-29 MED ORDER — SODIUM CHLORIDE 0.9 % IV SOLN
180.0000 mg | Freq: Once | INTRAVENOUS | Status: AC
  Administered 2020-11-29: 180 mg via INTRAVENOUS
  Filled 2020-11-29: qty 18

## 2020-11-29 NOTE — Progress Notes (Signed)
Patient was assessed by Dr. Katragadda and labs have been reviewed.  Patient is okay to proceed with treatment today. Primary RN and pharmacy aware.   

## 2020-11-29 NOTE — Patient Instructions (Signed)
[place] at [facility] Discharge Instructions  You were seen today by Dr. Delton Coombes. He went over your recent results. You received your treatment today; continue receiving your treatment weekly. Dr. Delton Coombes will see you back in 4 weeks for labs and follow up.   Thank you for choosing [place] at [facility] to provide your oncology and hematology care.  To afford each patient quality time with our provider, please arrive at least 15 minutes before your scheduled appointment time.   If you have a lab appointment with the [facility] please come in thru the Main Entrance and check in at the main information desk  You need to re-schedule your appointment should you arrive 10 or more minutes late.  We strive to give you quality time with our providers, and arriving late affects you and other patients whose appointments are after yours.  Also, if you no show three or more times for appointments you may be dismissed from the clinic at the providers discretion.     Again, thank you for choosing [facility].  Our hope is that these requests will decrease the amount of time that you wait before being seen by our physicians.       _____________________________________________________________  Should you have questions after your visit to [facility], please contact our office at [phone] between the hours of 8:00 a.m. and 4:30 p.m.  Voicemails left after 4:00 p.m. will not be returned until the following business day.  For prescription refill requests, have your pharmacy contact our office and allow 72 hours.    Cancer Center Support Programs:   > Cancer Support Group  2nd Tuesday of the month 1pm-2pm, Journey Room

## 2020-11-29 NOTE — Progress Notes (Signed)
Elizabeth Patent Graves, Elizabeth Graves [id number]   CLINIC:  Medical Oncology/Hematology  PCP:  Kathyrn Drown, MD [address] [place] / [place] [address] [phone]   REASON FOR VISIT:  Follow-up for left breast cancer  PRIOR THERAPY: AC x 4 cycles from 08/01/2020 to 09/15/2020  NGS Results: ER/PR/HER-2 negative  CURRENT THERAPY: Carboplatin & paclitaxel weekly  BRIEF ONCOLOGIC HISTORY:  Oncology History  Triple negative malignant neoplasm of breast ([place])  07/05/2020 Initial Diagnosis   Triple negative malignant neoplasm of breast ([place])   08/01/2020 -  Chemotherapy   The patient had DOXOrubicin (ADRIAMYCIN) chemo injection 104 mg, 60 mg/m2 = 104 mg, Intravenous,  Once, 4 of 4 cycles Administration: 104 mg (08/01/2020), 104 mg (08/14/2020), 104 mg (09/01/2020), 104 mg (09/15/2020) palonosetron (ALOXI) injection 0.25 mg, 0.25 mg, Intravenous,  Once, 7 of 8 cycles Administration: 0.25 mg (08/01/2020), 0.25 mg (10/02/2020), 0.25 mg (08/14/2020), 0.25 mg (09/01/2020), 0.25 mg (09/15/2020), 0.25 mg (10/23/2020), 0.25 mg (11/13/2020) pegfilgrastim (NEULASTA ONPRO KIT) injection 6 mg, 6 mg, Subcutaneous, Once, 1 of 1 cycle Administration: 6 mg (09/01/2020) pegfilgrastim-jmdb (FULPHILA) injection 6 mg, 6 mg, Subcutaneous,  Once, 3 of 3 cycles Administration: 6 mg (08/03/2020), 6 mg (08/16/2020), 6 mg (09/18/2020) CARBOplatin (PARAPLATIN) 240 mg in sodium chloride 0.9 % 250 mL chemo infusion, 240 mg (98.1 % of original dose 247 mg), Intravenous,  Once, 3 of 4 cycles Dose modification:   (original dose 247 mg, Cycle 5, Reason: Provider Judgment),   (original dose 247 mg, Cycle 6, Reason: Provider Judgment) Administration: 240 mg (10/02/2020), 240 mg (10/09/2020), 230 mg (10/23/2020), 180 mg (11/13/2020), 230 mg (10/30/2020), 190 mg (11/06/2020), 210 mg (10/16/2020) cyclophosphamide (CYTOXAN) 1,040 mg in sodium chloride 0.9 % 250 mL chemo infusion, 600 mg/m2 = 1,040 mg, Intravenous,  Once,  4 of 4 cycles Administration: 1,040 mg (08/01/2020), 1,040 mg (08/14/2020), 1,040 mg (09/01/2020), 1,040 mg (09/15/2020) PACLitaxel (TAXOL) 138 mg in sodium chloride 0.9 % 250 mL chemo infusion (</= $RemoveBefor'80mg'ceOajdxzeAPQ$ /m2), 80 mg/m2 = 138 mg, Intravenous,  Once, 3 of 4 cycles Administration: 138 mg (10/02/2020), 138 mg (10/09/2020), 138 mg (10/23/2020), 138 mg (10/30/2020), 138 mg (11/13/2020), 138 mg (11/06/2020), 138 mg (10/16/2020) fosaprepitant (EMEND) 150 mg in sodium chloride 0.9 % 145 mL IVPB, 150 mg, Intravenous,  Once, 4 of 4 cycles Administration: 150 mg (08/01/2020), 150 mg (08/14/2020), 150 mg (09/01/2020), 150 mg (09/15/2020)  for chemotherapy treatment.    08/17/2020 Genetic Testing   Negative genetic testing:  No pathogenic variants detected on the [facility] CustomNext-Cancer + RNAinsight panel. A variant of uncertain significance (VUS) was detected in the MSH2 gene called c.1600C>T (p.R534C). The report date is 08/17/2020.  The CustomNext-Cancer+RNAinsight panel offered by [facility] includes sequencing and rearrangement analysis for up to 91 genes, which included the following 47 genes for Ms. Renz:  APC*, ATM*, AXIN2, BARD1, BMPR1A, BRCA1*, BRCA2*, BRIP1*, CDH1*, CDK4, CDKN2A, CHEK2*, DICER1, EPCAM, GREM1, HOXB13, MEN1, MLH1*, MSH2*, MSH3, MSH6*, MUTYH*, NBN, NF1*, NF2, NTHL1, PALB2*, PMS2*, POLD1, POLE, PTEN*, RAD51C*, RAD51D*, RECQL, RET, SDHA, SDHAF2, SDHB, SDHC, SDHD, SMAD4, SMARCA4, STK11, TP53*, TSC1, TSC2, and VHL.  DNA and RNA analyses performed for * genes.       CANCER STAGING: Cancer Staging No matching staging information was found for the patient.  INTERVAL HISTORY:  Elizabeth Graves, a 54 y.o. female, returns for routine follow-up and consideration for next cycle of chemotherapy. Elizabeth Graves was last seen on 11/13/2020.  Due for day #8  of cycle #7 of carboplatin and paclitaxel today.   Overall, she tells me she has been feeling better than last week. She denies having any numbness or tingling  in her hands or feet, or skin rashes. She developed a canker sore on the inside of her bottom lip, but it has resolved. Her appetite is good and fluctuates day by day.  Overall, she feels ready for next cycle of chemo today.    REVIEW OF SYSTEMS:  Review of Systems  Constitutional: Positive for appetite change (75%) and fatigue (50%).  Respiratory: Positive for shortness of breath (w/ exertion).   Skin: Negative for rash.  Neurological: Negative for numbness.  All other systems reviewed and are negative.   PAST MEDICAL/SURGICAL HISTORY:  Past Medical History:  Diagnosis Date  . Asthma   . Cancer ([place])    cervical - 20 yrs ago  . Carpal tunnel syndrome   . Depression   . Family history of breast cancer   . Family history of esophageal cancer   . Family history of lung cancer   . Family history of throat cancer   . Hypertension   . Hypokalemia    tx w/ k-Dur  . Port-A-Cath in place 07/24/2020  . Seasonal allergies    tx with benadryl prn  . SVD (spontaneous vaginal delivery)    x 2   Past Surgical History:  Procedure Laterality Date  . ABDOMINAL HYSTERECTOMY    . CO2 Laser of Cervix     for cervical cancer 20 yrs ago  . DILATION AND CURETTAGE OF UTERUS  12/2010   polyp removed  . DILATION AND CURETTAGE OF UTERUS     x 3 for MAB  . LAPAROSCOPIC ASSISTED VAGINAL HYSTERECTOMY  06/09/2012   Procedure: LAPAROSCOPIC ASSISTED VAGINAL HYSTERECTOMY;  Surgeon: Gus Height, MD;  Location: [facility];  Service: Gynecology;  Laterality: N/A;  . PORTACATH PLACEMENT Right 07/17/2020   Procedure: INSERTION PORT-A-CATH;  Surgeon: Virl Cagey, MD;  Location: [facility];  Service: General;  Laterality: Right;  . SALPINGOOPHORECTOMY  06/09/2012   Procedure: SALPINGO OOPHERECTOMY;  Surgeon: Gus Height, MD;  Location: [facility];  Service: Gynecology;  Laterality: Bilateral;  . TUBAL LIGATION      SOCIAL HISTORY:  Social History   Socioeconomic History  . Marital status: Married    Spouse  name: Not on file  . Number of children: 2  . Years of education: Not on file  . Highest education level: Not on file  Occupational History  . Occupation: EMPLOYED  Tobacco Use  . Smoking status: Former Smoker    Packs/day: 1.00    Years: 16.00    Pack years: 16.00    Types: Cigarettes    Start date: 05/30/1994    Quit date: 01/31/2020    Years since quitting: 0.8  . Smokeless tobacco: Never Used  Vaping Use  . Vaping Use: Never used  Substance and Sexual Activity  . Alcohol use: No  . Drug use: No  . Sexual activity: Yes    Birth control/protection: Surgical  Other Topics Concern  . Not on file  Social History Narrative  . Not on file   Social Determinants of Health   Financial Resource Strain: Low Risk   . Difficulty of Paying Living Expenses: Not very hard  Food Insecurity: No Food Insecurity  . Worried About Charity fundraiser in the Last Year: Never true  . Ran Out of Food in the Last Year: Never true  Transportation Needs: No Transportation Needs  . Lack of Transportation (Medical): No  . Lack of Transportation (Non-Medical): No  Physical Activity: Inactive  . Days of Exercise per Week: 0 days  . Minutes of Exercise per Session: 0 min  Stress: Stress Concern Present  . Feeling of Stress : To some extent  Social Connections: Moderately Isolated  . Frequency of Communication with Friends and Family: Three times a week  . Frequency of Social Gatherings with Friends and Family: Once a week  . Attends Religious Services: Never  . Active Member of Clubs or Organizations: No  . Attends Banker Meetings: Never  . Marital Status: Married  Catering manager Violence: Not At Risk  . Fear of Current or Ex-Partner: No  . Emotionally Abused: No  . Physically Abused: No  . Sexually Abused: No    FAMILY HISTORY:  Family History  Problem Relation Age of Onset  . Heart disease Mother   . Lung cancer Mother 59       smoker  . Heart disease Father   .  Esophageal cancer Father        GE junction, dx. in his early 33s  . Diverticulitis Sister   . Diabetes Sister   . Diabetes Maternal Grandmother   . Heart disease Maternal Grandmother   . Throat cancer Maternal Grandmother        dx. late 64s  . Healthy Sister   . Healthy Daughter   . Healthy Son   . Breast cancer Paternal Aunt        limited info    CURRENT MEDICATIONS:  Current Outpatient Medications  Medication Sig Dispense Refill  . albuterol (VENTOLIN HFA) 108 (90 Base) MCG/ACT inhaler TAKE 2 PUFFS BY MOUTH EVERY 6 HOURS AS NEEDED FOR WHEEZE OR SHORTNESS OF BREATH 8.5 Inhaler 0  . atorvastatin (LIPITOR) 20 MG tablet TAKE 1 TABLET BY MOUTH EVERY DAY 90 tablet 1  . CYCLOPHOSPHAMIDE IV Inject into the vein every 14 (fourteen) days.    . diphenhydrAMINE (BENADRYL) 25 mg capsule Take 25 mg by mouth every 6 (six) hours as needed for allergies. Allergies.    Marland Kitchen DOXOrubicin HCl (ADRIAMYCIN IV) Inject into the vein every 14 (fourteen) days.    Marland Kitchen esomeprazole (NEXIUM) 20 MG capsule Take 20 mg by mouth daily before breakfast.    . ibuprofen (ADVIL,MOTRIN) 200 MG tablet Take 400-600 mg by mouth every 8 (eight) hours as needed for moderate pain (pain.).     Marland Kitchen lisinopril (ZESTRIL) 5 MG tablet TAKE 1 TABLET BY MOUTH EVERY DAY 90 tablet 1  . naproxen sodium (ALEVE) 220 MG tablet Take 440 mg by mouth 3 (three) times daily as needed (pain.).     Marland Kitchen pegfilgrastim-jmdb (FULPHILA) 6 MG/0.6ML injection Inject 6 mg into the skin every 14 (fourteen) days.    . potassium chloride (KLOR-CON) 10 MEQ tablet Take 1 tablet (10 mEq total) by mouth daily. 30 tablet 2  . sertraline (ZOLOFT) 50 MG tablet TAKE 1 TABLET BY MOUTH EVERY DAY 90 tablet 0  . lidocaine-prilocaine (EMLA) cream Apply a pea sized amount to port a cath site and cover with plastic wrap 1 hour prior to chemotherapy appointments (Patient not taking: Reported on 11/29/2020) 30 g 3  . prochlorperazine (COMPAZINE) 10 MG tablet Take 1 tablet (10 mg  total) by mouth every 6 (six) hours as needed (Nausea or vomiting). (Patient not taking: Reported on 11/29/2020) 30 tablet 1   No current facility-administered medications for this  visit.    ALLERGIES:  No Known Allergies  PHYSICAL EXAM:  Performance status (ECOG): 0 - Asymptomatic  Vitals:   11/29/20 0758  BP: 136/69  Pulse: (!) 107  Resp: 18  Temp: (!) 97.3 F (36.3 C)  SpO2: 99%   Wt Readings from Last 3 Encounters:  11/29/20 163 lb 11.2 oz (74.3 kg)  11/21/20 161 lb 4.8 oz (73.2 kg)  11/13/20 160 lb 12.8 oz (72.9 kg)   Physical Exam Vitals reviewed.  Constitutional:      Appearance: Normal appearance. She is obese.  Cardiovascular:     Rate and Rhythm: Normal rate and regular rhythm.     Pulses: Normal pulses.     Heart sounds: Normal heart sounds.  Pulmonary:     Effort: Pulmonary effort is normal.     Breath sounds: Normal breath sounds.  Chest:     Comments: Port-a-Cath in L chest Neurological:     General: No focal deficit present.     Mental Status: She is alert and oriented to person, place, and time.  Psychiatric:        Mood and Affect: Mood normal.        Behavior: Behavior normal.     LABORATORY DATA:  I have reviewed the labs as listed.  CBC Latest Ref Rng & Units 11/29/2020 11/27/2020 11/21/2020  WBC 4.0 - 10.5 K/uL 4.2 3.9(L) 2.7(L)  Hemoglobin 12.0 - 15.0 g/dL 9.6(L) 9.3(L) 8.4(L)  Hematocrit 36 - 46 % 29.8(L) 28.8(L) 25.1(L)  Platelets 150 - 400 K/uL 234 240 199   CMP Latest Ref Rng & Units 11/29/2020 11/21/2020 11/13/2020  Glucose 70 - 99 mg/dL 335(H) 234(H) 220(H)  BUN 6 - 20 mg/dL $Remove'11 17 17  'rdCvknz$ Creatinine 0.44 - 1.00 mg/dL 0.83 0.94 0.82  Sodium 135 - 145 mmol/L 131(L) 135 134(L)  Potassium 3.5 - 5.1 mmol/L 3.7 3.4(L) 3.7  Chloride 98 - 111 mmol/L 101 102 103  CO2 22 - 32 mmol/L $RemoveB'22 24 23  'AfqBwwBS$ Calcium 8.9 - 10.3 mg/dL 8.8(L) 8.8(L) 8.9  Total Protein 6.5 - 8.1 g/dL 6.8 6.6 6.4(L)  Total Bilirubin 0.3 - 1.2 mg/dL 0.4 0.4 0.5  Alkaline Phos 38  - 126 U/L 76 71 79  AST 15 - 41 U/L $Remo'24 17 15  'ddekB$ ALT 0 - 44 U/L 32 25 25    DIAGNOSTIC IMAGING:  I have independently reviewed the scans and discussed with the patient. No results found.   ASSESSMENT:  1. Triple negative left breast cancer (T2N1): -Patient identified left breast mass with left nipple retraction about 2 months ago. -Mammogram on 06/13/2020 showed mass in the retroareolar 12 o'clock position in the left breast. 2 suspicious left axillary lymph nodes, largest measuring 2 cm. -Breast ultrasound showed 2.7 x 1.6 x 2.2 cm mass in the 12:00 retroareolar position. Just lateral to the mass is a calcified hypoechoic nodule that corresponds to densely calcified degenerating fibroadenoma. -Left breast 12:00 biopsy showed invasive ductal carcinoma, grade 2/3. Left axillary lymph node biopsy was consistent with meta stasis. -ER negative, PR negative and Ki-67 15%. HER-2 equivocal by IHC. HER-2 FISH negative. -MRI of the breast on 07/19/2020 shows 4.2 cm enhancing mass in the subareolar left breast, enhancement extending to the level of the left nipple with associated nipple retraction and periareolar skin thickening. 6 morphologically abnormal level 1 left axillary lymph nodes. No evidence of malignancy in the right breast. -CT CAP on 07/14/2020 shows dominant left axillary nodes measuring 1.8 cm short axis. Small left  subpectoral nodes measuring up to 7 mm short axis, suspicious. Small mediastinal lymph nodes measuring 7 to 9 mm, thought to be reactive. No other evidence of metastatic disease. -NeoadjuvantDose dense ACstarted on 08/01/2020.  2. Family history: -Father had gastroesophageal junction cancer. Maternal grandmother had throat cancer and paternal aunt had breast cancer. -[facility] genetics testing showed MSH2VUS.  3. High risk drug monitoring: -Echo on 07/12/2020 shows EF 60 to 65%.   PLAN:  1. T2N1 left breast TNBC: -She has completed 7 weekly doses of paclitaxel  and carboplatin. -Last week she felt very weak.  Her ANC was low at 1.1.  Hence we have given her a break. -Today she feels much better.  I have reviewed her labs which showed white count 4.2 with ANC of 2.2.  LFTs were normal. -She will proceed with weekly paclitaxel with carboplatin AUC 1.5. -I will reevaluate her in 4 weeks.  2. Family history: -Genetic testing did not show any pathological mutations.  3. Hypokalemia: -Continue potassium 10 mEq daily.   Orders placed this encounter:  No orders of the defined types were placed in this encounter.    Derek Jack, MD [place] [phone]   I, Milinda Antis, am acting as a scribe for Dr. Sanda Linger.  I, Derek Jack MD, have reviewed the above documentation for accuracy and completeness, and I agree with the above.

## 2020-11-29 NOTE — Progress Notes (Signed)
Patient tolerated chemotherapy with no complaints voiced.  Side effects with management reviewed with understanding verbalized.  Port site clean and dry with no bruising or swelling noted at site.  Good blood return noted before and after administration of chemotherapy.  Band aid applied.  Patient left in satisfactory condition with VSS and no s/s of distress noted.   

## 2020-12-06 ENCOUNTER — Other Ambulatory Visit: Payer: Self-pay

## 2020-12-06 ENCOUNTER — Inpatient Hospital Stay (HOSPITAL_COMMUNITY): Payer: BC Managed Care – PPO

## 2020-12-06 ENCOUNTER — Encounter (HOSPITAL_COMMUNITY): Payer: Self-pay

## 2020-12-06 VITALS — BP 130/73 | HR 95 | Temp 97.1°F | Resp 18 | Wt 160.8 lb

## 2020-12-06 DIAGNOSIS — Z5111 Encounter for antineoplastic chemotherapy: Secondary | ICD-10-CM | POA: Diagnosis not present

## 2020-12-06 DIAGNOSIS — C50919 Malignant neoplasm of unspecified site of unspecified female breast: Secondary | ICD-10-CM

## 2020-12-06 DIAGNOSIS — Z95828 Presence of other vascular implants and grafts: Secondary | ICD-10-CM

## 2020-12-06 LAB — COMPREHENSIVE METABOLIC PANEL
ALT: 46 U/L — ABNORMAL HIGH (ref 0–44)
AST: 31 U/L (ref 15–41)
Albumin: 3.9 g/dL (ref 3.5–5.0)
Alkaline Phosphatase: 75 U/L (ref 38–126)
Anion gap: 8 (ref 5–15)
BUN: 13 mg/dL (ref 6–20)
CO2: 23 mmol/L (ref 22–32)
Calcium: 8.4 mg/dL — ABNORMAL LOW (ref 8.9–10.3)
Chloride: 101 mmol/L (ref 98–111)
Creatinine, Ser: 0.77 mg/dL (ref 0.44–1.00)
GFR, Estimated: >60 mL/min (ref >60)
Glucose, Bld: 184 mg/dL — ABNORMAL HIGH (ref 70–99)
Potassium: 3.4 mmol/L — ABNORMAL LOW (ref 3.5–5.1)
Sodium: 132 mmol/L — ABNORMAL LOW (ref 135–145)
Total Bilirubin: 0.4 mg/dL (ref 0.3–1.2)
Total Protein: 6.9 g/dL (ref 6.5–8.1)

## 2020-12-06 LAB — CBC WITH DIFFERENTIAL/PLATELET
Abs Immature Granulocytes: 0.04 10*3/uL (ref 0.00–0.07)
Basophils Absolute: 0 10*3/uL (ref 0.0–0.1)
Basophils Relative: 0 %
Eosinophils Absolute: 0.1 10*3/uL (ref 0.0–0.5)
Eosinophils Relative: 2 %
HCT: 29.5 % — ABNORMAL LOW (ref 36.0–46.0)
Hemoglobin: 9.7 g/dL — ABNORMAL LOW (ref 12.0–15.0)
Immature Granulocytes: 1 %
Lymphocytes Relative: 28 %
Lymphs Abs: 0.8 10*3/uL (ref 0.7–4.0)
MCH: 31.6 pg (ref 26.0–34.0)
MCHC: 32.9 g/dL (ref 30.0–36.0)
MCV: 96.1 fL (ref 80.0–100.0)
Monocytes Absolute: 0.3 10*3/uL (ref 0.1–1.0)
Monocytes Relative: 10 %
Neutro Abs: 1.7 10*3/uL (ref 1.7–7.7)
Neutrophils Relative %: 59 %
Platelets: 171 10*3/uL (ref 150–400)
RBC: 3.07 MIL/uL — ABNORMAL LOW (ref 3.87–5.11)
RDW: 18.6 % — ABNORMAL HIGH (ref 11.5–15.5)
WBC: 2.9 10*3/uL — ABNORMAL LOW (ref 4.0–10.5)
nRBC: 0 % (ref 0.0–0.2)

## 2020-12-06 MED ORDER — FAMOTIDINE IN NACL 20-0.9 MG/50ML-% IV SOLN
20.0000 mg | Freq: Once | INTRAVENOUS | Status: AC
  Administered 2020-12-06: 20 mg via INTRAVENOUS
  Filled 2020-12-06: qty 50

## 2020-12-06 MED ORDER — SODIUM CHLORIDE 0.9 % IV SOLN
180.0000 mg | Freq: Once | INTRAVENOUS | Status: AC
  Administered 2020-12-06: 180 mg via INTRAVENOUS
  Filled 2020-12-06: qty 18

## 2020-12-06 MED ORDER — SODIUM CHLORIDE 0.9 % IV SOLN
20.0000 mg | Freq: Once | INTRAVENOUS | Status: AC
  Administered 2020-12-06: 20 mg via INTRAVENOUS
  Filled 2020-12-06: qty 20

## 2020-12-06 MED ORDER — SODIUM CHLORIDE 0.9 % IV SOLN
80.0000 mg/m2 | Freq: Once | INTRAVENOUS | Status: AC
  Administered 2020-12-06: 138 mg via INTRAVENOUS
  Filled 2020-12-06: qty 23

## 2020-12-06 MED ORDER — SODIUM CHLORIDE 0.9 % IV SOLN: Freq: Once | INTRAVENOUS | Status: AC

## 2020-12-06 MED ORDER — DIPHENHYDRAMINE HCL 50 MG/ML IJ SOLN
50.0000 mg | Freq: Once | INTRAMUSCULAR | Status: AC
  Administered 2020-12-06: 50 mg via INTRAVENOUS
  Filled 2020-12-06: qty 1

## 2020-12-06 MED ORDER — HEPARIN SOD (PORK) LOCK FLUSH 100 UNIT/ML IV SOLN
500.0000 [IU] | Freq: Once | INTRAVENOUS | Status: AC | PRN
  Administered 2020-12-06: 500 [IU]

## 2020-12-06 MED ORDER — SODIUM CHLORIDE 0.9% FLUSH
10.0000 mL | INTRAVENOUS | Status: DC | PRN
  Administered 2020-12-06: 10 mL

## 2020-12-06 NOTE — Progress Notes (Signed)
ANC 1.7 today for treatment.  Dr. Chryl Heck made aware and pharmacy.    Patient to return on Tuesday, December 14, for cbcdiff to check [place] for possible neupogen support for next day treatment.   Patient tolerated chemotherapy with no complaints voiced.  Side effects with management reviewed with understanding verbalized.  Port site clean and dry with no bruising or swelling noted at site.  Good blood return noted before and after administration of chemotherapy.  Band aid applied.  Patient left in satisfactory condition with VSS and no s/s of distress noted.

## 2020-12-12 ENCOUNTER — Other Ambulatory Visit: Payer: Self-pay | Admitting: Hematology

## 2020-12-12 ENCOUNTER — Inpatient Hospital Stay (HOSPITAL_COMMUNITY): Payer: BC Managed Care – PPO

## 2020-12-12 ENCOUNTER — Other Ambulatory Visit: Payer: Self-pay

## 2020-12-12 ENCOUNTER — Encounter (HOSPITAL_COMMUNITY): Payer: Self-pay

## 2020-12-12 VITALS — BP 126/73 | HR 99 | Temp 98.0°F | Resp 18

## 2020-12-12 DIAGNOSIS — Z5111 Encounter for antineoplastic chemotherapy: Secondary | ICD-10-CM | POA: Diagnosis not present

## 2020-12-12 DIAGNOSIS — Z95828 Presence of other vascular implants and grafts: Secondary | ICD-10-CM

## 2020-12-12 DIAGNOSIS — D702 Other drug-induced agranulocytosis: Secondary | ICD-10-CM | POA: Insufficient documentation

## 2020-12-12 DIAGNOSIS — C50919 Malignant neoplasm of unspecified site of unspecified female breast: Secondary | ICD-10-CM

## 2020-12-12 LAB — COMPREHENSIVE METABOLIC PANEL
ALT: 37 U/L (ref 0–44)
AST: 32 U/L (ref 15–41)
Albumin: 3.5 g/dL (ref 3.5–5.0)
Alkaline Phosphatase: 69 U/L (ref 38–126)
Anion gap: 8 (ref 5–15)
BUN: 10 mg/dL (ref 6–20)
CO2: 24 mmol/L (ref 22–32)
Calcium: 8.2 mg/dL — ABNORMAL LOW (ref 8.9–10.3)
Chloride: 99 mmol/L (ref 98–111)
Creatinine, Ser: 0.83 mg/dL (ref 0.44–1.00)
GFR, Estimated: >60 mL/min (ref >60)
Glucose, Bld: 195 mg/dL — ABNORMAL HIGH (ref 70–99)
Potassium: 3.1 mmol/L — ABNORMAL LOW (ref 3.5–5.1)
Sodium: 131 mmol/L — ABNORMAL LOW (ref 135–145)
Total Bilirubin: 0.6 mg/dL (ref 0.3–1.2)
Total Protein: 6.4 g/dL — ABNORMAL LOW (ref 6.5–8.1)

## 2020-12-12 LAB — CBC WITH DIFFERENTIAL/PLATELET
Basophils Absolute: 0 10*3/uL (ref 0.0–0.1)
Basophils Relative: 0 %
Eosinophils Absolute: 0 10*3/uL (ref 0.0–0.5)
Eosinophils Relative: 0 %
HCT: 27 % — ABNORMAL LOW (ref 36.0–46.0)
Hemoglobin: 9.1 g/dL — ABNORMAL LOW (ref 12.0–15.0)
Lymphocytes Relative: 43 %
Lymphs Abs: 0.9 10*3/uL (ref 0.7–4.0)
MCH: 32 pg (ref 26.0–34.0)
MCHC: 33.7 g/dL (ref 30.0–36.0)
MCV: 95.1 fL (ref 80.0–100.0)
Monocytes Absolute: 0.3 10*3/uL (ref 0.1–1.0)
Monocytes Relative: 13 %
Neutro Abs: 0.9 10*3/uL — ABNORMAL LOW (ref 1.7–7.7)
Neutrophils Relative %: 44 %
Platelets: 106 10*3/uL — ABNORMAL LOW (ref 150–400)
RBC: 2.84 MIL/uL — ABNORMAL LOW (ref 3.87–5.11)
RDW: 17.6 % — ABNORMAL HIGH (ref 11.5–15.5)
WBC: 2 10*3/uL — ABNORMAL LOW (ref 4.0–10.5)
nRBC: 0 % (ref 0.0–0.2)

## 2020-12-12 MED ORDER — HEPARIN SOD (PORK) LOCK FLUSH 100 UNIT/ML IV SOLN
500.0000 [IU] | Freq: Once | INTRAVENOUS | Status: AC
  Administered 2020-12-12: 13:00:00 500 [IU] via INTRAVENOUS

## 2020-12-12 MED ORDER — FILGRASTIM-SNDZ 480 MCG/0.8ML IJ SOSY
480.0000 ug | PREFILLED_SYRINGE | Freq: Once | INTRAMUSCULAR | Status: AC
  Administered 2020-12-12: 16:00:00 480 ug via SUBCUTANEOUS
  Filled 2020-12-12: qty 0.8

## 2020-12-12 MED ORDER — SODIUM CHLORIDE 0.9% FLUSH
10.0000 mL | INTRAVENOUS | Status: DC | PRN
  Administered 2020-12-12: 13:00:00 10 mL via INTRAVENOUS

## 2020-12-12 NOTE — Patient Instructions (Signed)
[facility] at [facility]  Discharge Instructions:   _______________________________________________________________  Thank you for choosing [facility] at [facility] to provide your oncology and hematology care.  To afford each patient quality time with our providers, please arrive at least 15 minutes before your scheduled appointment.  You need to re-schedule your appointment if you arrive 10 or more minutes late.  We strive to give you quality time with our providers, and arriving late affects you and other patients whose appointments are after yours.  Also, if you no show three or more times for appointments you may be dismissed from the clinic.  Again, thank you for choosing [facility] at [facility]. Our hope is that these requests will allow you access to exceptional care and in a timely manner. _______________________________________________________________  If you have questions after your visit, please contact our office at [phone] between the hours of 8:30 a.m. and 5:00 p.m. Voicemails left after 4:30 p.m. will not be returned until the following business day. _______________________________________________________________  For prescription refill requests, have your pharmacy contact our office. _______________________________________________________________  Recommendations made by the consultant and any test results will be sent to your referring physician. _______________________________________________________________ 

## 2020-12-12 NOTE — Progress Notes (Signed)
Elizabeth Graves presented for Portacath access and flush and lab draw. Portacath located right chest wall accessed with  H 20 needle.  Good blood return present. Portacath flushed with 90ml NS and 500U/37ml Heparin.  Pt is going to stay accessed for treatment tomorrow.   Procedure tolerated well and without incident.    ANC was 0.9. neupogen given. Pt discharged in stable condition ambulatory.

## 2020-12-13 ENCOUNTER — Inpatient Hospital Stay (HOSPITAL_COMMUNITY): Payer: BC Managed Care – PPO

## 2020-12-13 ENCOUNTER — Encounter (HOSPITAL_COMMUNITY): Payer: Self-pay

## 2020-12-13 VITALS — BP 110/57 | HR 91 | Temp 97.0°F | Resp 18

## 2020-12-13 DIAGNOSIS — Z95828 Presence of other vascular implants and grafts: Secondary | ICD-10-CM

## 2020-12-13 DIAGNOSIS — C50919 Malignant neoplasm of unspecified site of unspecified female breast: Secondary | ICD-10-CM

## 2020-12-13 DIAGNOSIS — Z5111 Encounter for antineoplastic chemotherapy: Secondary | ICD-10-CM | POA: Diagnosis not present

## 2020-12-13 LAB — COMPREHENSIVE METABOLIC PANEL
ALT: 42 U/L (ref 0–44)
AST: 37 U/L (ref 15–41)
Albumin: 3.5 g/dL (ref 3.5–5.0)
Alkaline Phosphatase: 78 U/L (ref 38–126)
Anion gap: 10 (ref 5–15)
BUN: 9 mg/dL (ref 6–20)
CO2: 23 mmol/L (ref 22–32)
Calcium: 8.3 mg/dL — ABNORMAL LOW (ref 8.9–10.3)
Chloride: 101 mmol/L (ref 98–111)
Creatinine, Ser: 0.74 mg/dL (ref 0.44–1.00)
GFR, Estimated: >60 mL/min (ref >60)
Glucose, Bld: 196 mg/dL — ABNORMAL HIGH (ref 70–99)
Potassium: 3.5 mmol/L (ref 3.5–5.1)
Sodium: 134 mmol/L — ABNORMAL LOW (ref 135–145)
Total Bilirubin: 0.6 mg/dL (ref 0.3–1.2)
Total Protein: 6.7 g/dL (ref 6.5–8.1)

## 2020-12-13 LAB — CBC WITH DIFFERENTIAL/PLATELET
Band Neutrophils: 2 %
Basophils Absolute: 0 10*3/uL (ref 0.0–0.1)
Basophils Relative: 0 %
Eosinophils Absolute: 0 10*3/uL (ref 0.0–0.5)
Eosinophils Relative: 0 %
HCT: 27.7 % — ABNORMAL LOW (ref 36.0–46.0)
Hemoglobin: 9.4 g/dL — ABNORMAL LOW (ref 12.0–15.0)
Lymphocytes Relative: 10 %
Lymphs Abs: 0.9 10*3/uL (ref 0.7–4.0)
MCH: 32.3 pg (ref 26.0–34.0)
MCHC: 33.9 g/dL (ref 30.0–36.0)
MCV: 95.2 fL (ref 80.0–100.0)
Metamyelocytes Relative: 7 %
Monocytes Absolute: 0.6 10*3/uL (ref 0.1–1.0)
Monocytes Relative: 6 %
Neutro Abs: 7.1 10*3/uL (ref 1.7–7.7)
Neutrophils Relative %: 75 %
Platelets: 104 10*3/uL — ABNORMAL LOW (ref 150–400)
RBC: 2.91 MIL/uL — ABNORMAL LOW (ref 3.87–5.11)
RDW: 18.1 % — ABNORMAL HIGH (ref 11.5–15.5)
WBC: 9.2 10*3/uL (ref 4.0–10.5)
nRBC: 0.2 % (ref 0.0–0.2)

## 2020-12-13 MED ORDER — SODIUM CHLORIDE 0.9 % IV SOLN: Freq: Once | INTRAVENOUS | Status: AC

## 2020-12-13 MED ORDER — FAMOTIDINE IN NACL 20-0.9 MG/50ML-% IV SOLN
20.0000 mg | Freq: Once | INTRAVENOUS | Status: AC
  Administered 2020-12-13: 09:00:00 20 mg via INTRAVENOUS
  Filled 2020-12-13: qty 50

## 2020-12-13 MED ORDER — SODIUM CHLORIDE 0.9 % IV SOLN
10.0000 mg | Freq: Once | INTRAVENOUS | Status: AC
  Administered 2020-12-13: 09:00:00 10 mg via INTRAVENOUS
  Filled 2020-12-13: qty 10

## 2020-12-13 MED ORDER — SODIUM CHLORIDE 0.9 % IV SOLN
180.0000 mg | Freq: Once | INTRAVENOUS | Status: AC
  Administered 2020-12-13: 12:00:00 180 mg via INTRAVENOUS
  Filled 2020-12-13: qty 18

## 2020-12-13 MED ORDER — SODIUM CHLORIDE 0.9 % IV SOLN
80.0000 mg/m2 | Freq: Once | INTRAVENOUS | Status: AC
  Administered 2020-12-13: 10:00:00 138 mg via INTRAVENOUS
  Filled 2020-12-13: qty 23

## 2020-12-13 MED ORDER — DIPHENHYDRAMINE HCL 50 MG/ML IJ SOLN
50.0000 mg | Freq: Once | INTRAMUSCULAR | Status: AC
  Administered 2020-12-13: 09:00:00 50 mg via INTRAVENOUS
  Filled 2020-12-13: qty 1

## 2020-12-13 MED ORDER — HEPARIN SOD (PORK) LOCK FLUSH 100 UNIT/ML IV SOLN
500.0000 [IU] | Freq: Once | INTRAVENOUS | Status: AC | PRN
  Administered 2020-12-13: 12:00:00 500 [IU]

## 2020-12-13 MED ORDER — PALONOSETRON HCL INJECTION 0.25 MG/5ML
0.2500 mg | Freq: Once | INTRAVENOUS | Status: AC
  Administered 2020-12-13: 09:00:00 0.25 mg via INTRAVENOUS
  Filled 2020-12-13: qty 5

## 2020-12-13 NOTE — Progress Notes (Signed)
Patient here today for treatment.  Port already accessed. Port flushed and labs drawn.  Patient tolerated without incidence.

## 2020-12-13 NOTE — Progress Notes (Signed)
Pt here for D1C8 of carbo/taxol. Pts labs and VS WNL for treatment.  Pt has been having sinus symptoms and cough for about 2 weeks.  Pt has been taking nyquil, tylenol sinus, and mucinex.  Spoke with Dr Benay Spice and he wants her to let it run it course due to the fact could be viral. Pt verbalized understanding.  Pt will come back day prior to chemo to check ANC.  Pt toelrated treatment well today.  Discharged in stable condition ambulatory.  Vital signs stable prior to discharge.

## 2020-12-13 NOTE — Patient Instructions (Signed)
[facility] Discharge Instructions for Patients Receiving Chemotherapy  Today you received the following chemotherapy agents   To help prevent nausea and vomiting after your treatment, we encourage you to take your nausea medication   If you develop nausea and vomiting that is not controlled by your nausea medication, call the clinic.   BELOW ARE SYMPTOMS THAT SHOULD BE REPORTED IMMEDIATELY:  *FEVER GREATER THAN 100.5 F  *CHILLS WITH OR WITHOUT FEVER  NAUSEA AND VOMITING THAT IS NOT CONTROLLED WITH YOUR NAUSEA MEDICATION  *UNUSUAL SHORTNESS OF BREATH  *UNUSUAL BRUISING OR BLEEDING  TENDERNESS IN MOUTH AND THROAT WITH OR WITHOUT PRESENCE OF ULCERS  *URINARY PROBLEMS  *BOWEL PROBLEMS  UNUSUAL RASH Items with * indicate a potential emergency and should be followed up as soon as possible.  Feel free to call the clinic should you have any questions or concerns. The clinic phone number is [phone].  Please show the CHEMO ALERT CARD at check-in to the Emergency Department and triage nurse.   

## 2020-12-19 ENCOUNTER — Other Ambulatory Visit: Payer: Self-pay

## 2020-12-19 ENCOUNTER — Inpatient Hospital Stay (HOSPITAL_COMMUNITY): Payer: BC Managed Care – PPO

## 2020-12-19 VITALS — BP 106/56 | HR 111 | Temp 96.8°F | Resp 18

## 2020-12-19 DIAGNOSIS — Z95828 Presence of other vascular implants and grafts: Secondary | ICD-10-CM

## 2020-12-19 DIAGNOSIS — D702 Other drug-induced agranulocytosis: Secondary | ICD-10-CM

## 2020-12-19 DIAGNOSIS — C50919 Malignant neoplasm of unspecified site of unspecified female breast: Secondary | ICD-10-CM

## 2020-12-19 DIAGNOSIS — Z5111 Encounter for antineoplastic chemotherapy: Secondary | ICD-10-CM | POA: Diagnosis not present

## 2020-12-19 LAB — CBC WITH DIFFERENTIAL/PLATELET
Band Neutrophils: 3 %
Basophils Absolute: 0 10*3/uL (ref 0.0–0.1)
Basophils Relative: 0 %
Eosinophils Absolute: 0 10*3/uL (ref 0.0–0.5)
Eosinophils Relative: 0 %
HCT: 27 % — ABNORMAL LOW (ref 36.0–46.0)
Hemoglobin: 8.9 g/dL — ABNORMAL LOW (ref 12.0–15.0)
Lymphocytes Relative: 45 %
Lymphs Abs: 0.6 10*3/uL — ABNORMAL LOW (ref 0.7–4.0)
MCH: 31.4 pg (ref 26.0–34.0)
MCHC: 33 g/dL (ref 30.0–36.0)
MCV: 95.4 fL (ref 80.0–100.0)
Metamyelocytes Relative: 3 %
Monocytes Absolute: 0.1 10*3/uL (ref 0.1–1.0)
Monocytes Relative: 4 %
Myelocytes: 2 %
Neutro Abs: 0.6 10*3/uL — ABNORMAL LOW (ref 1.7–7.7)
Neutrophils Relative %: 43 %
Platelets: 65 10*3/uL — ABNORMAL LOW (ref 150–400)
RBC: 2.83 MIL/uL — ABNORMAL LOW (ref 3.87–5.11)
RDW: 17.1 % — ABNORMAL HIGH (ref 11.5–15.5)
WBC: 1.4 10*3/uL — CL (ref 4.0–10.5)
nRBC: 0 % (ref 0.0–0.2)

## 2020-12-19 LAB — COMPREHENSIVE METABOLIC PANEL
ALT: 27 U/L (ref 0–44)
AST: 25 U/L (ref 15–41)
Albumin: 3.4 g/dL — ABNORMAL LOW (ref 3.5–5.0)
Alkaline Phosphatase: 74 U/L (ref 38–126)
Anion gap: 10 (ref 5–15)
BUN: 12 mg/dL (ref 6–20)
CO2: 23 mmol/L (ref 22–32)
Calcium: 8.5 mg/dL — ABNORMAL LOW (ref 8.9–10.3)
Chloride: 99 mmol/L (ref 98–111)
Creatinine, Ser: 0.81 mg/dL (ref 0.44–1.00)
GFR, Estimated: >60 mL/min (ref >60)
Glucose, Bld: 230 mg/dL — ABNORMAL HIGH (ref 70–99)
Potassium: 3.3 mmol/L — ABNORMAL LOW (ref 3.5–5.1)
Sodium: 132 mmol/L — ABNORMAL LOW (ref 135–145)
Total Bilirubin: 0.3 mg/dL (ref 0.3–1.2)
Total Protein: 6.8 g/dL (ref 6.5–8.1)

## 2020-12-19 MED ORDER — FILGRASTIM-SNDZ 480 MCG/0.8ML IJ SOSY
480.0000 ug | PREFILLED_SYRINGE | Freq: Once | INTRAMUSCULAR | Status: AC
  Administered 2020-12-19: 10:00:00 480 ug via SUBCUTANEOUS
  Filled 2020-12-19: qty 0.8

## 2020-12-19 NOTE — Progress Notes (Signed)
CRITICAL VALUE ALERT  Critical Value:  WBC 1.4  Date & Time Notied:  12/19/2020 at 1009  Provider Notified: Dr. Chryl Heck  Orders Received/Actions taken: administer Zarxio and repeat CBC/diff tomorrow prior to tx.

## 2020-12-19 NOTE — Progress Notes (Signed)
Patients port flushed without difficulty.  Good blood return noted with no bruising or swelling noted at site.  Transparent dressing applied.  Patient left accessed for treatment tomorrow.  Patient tolerated Zarxio injection with no complaints voiced.  Site clean and dry with no bruising or swelling noted.  No complaints of pain.  Discharged with vital signs stable and no signs or symptoms of distress noted.

## 2020-12-20 ENCOUNTER — Inpatient Hospital Stay (HOSPITAL_COMMUNITY): Payer: BC Managed Care – PPO

## 2020-12-20 ENCOUNTER — Encounter (HOSPITAL_COMMUNITY): Payer: Self-pay

## 2020-12-20 VITALS — BP 127/74 | HR 88 | Temp 96.9°F | Resp 18

## 2020-12-20 DIAGNOSIS — Z5111 Encounter for antineoplastic chemotherapy: Secondary | ICD-10-CM | POA: Diagnosis not present

## 2020-12-20 DIAGNOSIS — Z95828 Presence of other vascular implants and grafts: Secondary | ICD-10-CM

## 2020-12-20 DIAGNOSIS — C50919 Malignant neoplasm of unspecified site of unspecified female breast: Secondary | ICD-10-CM

## 2020-12-20 LAB — CBC WITH DIFFERENTIAL/PLATELET
Band Neutrophils: 4 %
Basophils Absolute: 0 10*3/uL (ref 0.0–0.1)
Basophils Relative: 0 %
Eosinophils Absolute: 0 10*3/uL (ref 0.0–0.5)
Eosinophils Relative: 0 %
HCT: 27.2 % — ABNORMAL LOW (ref 36.0–46.0)
Hemoglobin: 8.9 g/dL — ABNORMAL LOW (ref 12.0–15.0)
Lymphocytes Relative: 13 %
Lymphs Abs: 0.6 10*3/uL — ABNORMAL LOW (ref 0.7–4.0)
MCH: 31.7 pg (ref 26.0–34.0)
MCHC: 32.7 g/dL (ref 30.0–36.0)
MCV: 96.8 fL (ref 80.0–100.0)
Metamyelocytes Relative: 1 %
Monocytes Absolute: 0.1 10*3/uL (ref 0.1–1.0)
Monocytes Relative: 2 %
Myelocytes: 3 %
Neutro Abs: 3.9 10*3/uL (ref 1.7–7.7)
Neutrophils Relative %: 76 %
Platelets: 60 10*3/uL — ABNORMAL LOW (ref 150–400)
Promyelocytes Relative: 1 %
RBC: 2.81 MIL/uL — ABNORMAL LOW (ref 3.87–5.11)
RDW: 17.4 % — ABNORMAL HIGH (ref 11.5–15.5)
WBC: 4.9 10*3/uL (ref 4.0–10.5)
nRBC: 0 % (ref 0.0–0.2)

## 2020-12-20 LAB — COMPREHENSIVE METABOLIC PANEL
ALT: 25 U/L (ref 0–44)
AST: 21 U/L (ref 15–41)
Albumin: 3.6 g/dL (ref 3.5–5.0)
Alkaline Phosphatase: 81 U/L (ref 38–126)
Anion gap: 10 (ref 5–15)
BUN: 13 mg/dL (ref 6–20)
CO2: 24 mmol/L (ref 22–32)
Calcium: 8.6 mg/dL — ABNORMAL LOW (ref 8.9–10.3)
Chloride: 101 mmol/L (ref 98–111)
Creatinine, Ser: 0.87 mg/dL (ref 0.44–1.00)
GFR, Estimated: >60 mL/min (ref >60)
Glucose, Bld: 193 mg/dL — ABNORMAL HIGH (ref 70–99)
Potassium: 3.4 mmol/L — ABNORMAL LOW (ref 3.5–5.1)
Sodium: 135 mmol/L (ref 135–145)
Total Bilirubin: 0.4 mg/dL (ref 0.3–1.2)
Total Protein: 7 g/dL (ref 6.5–8.1)

## 2020-12-20 MED ORDER — SODIUM CHLORIDE 0.9 % IV SOLN: Freq: Once | INTRAVENOUS | Status: AC

## 2020-12-20 MED ORDER — DIPHENHYDRAMINE HCL 50 MG/ML IJ SOLN
50.0000 mg | Freq: Once | INTRAMUSCULAR | Status: AC
  Administered 2020-12-20: 10:00:00 50 mg via INTRAVENOUS
  Filled 2020-12-20: qty 1

## 2020-12-20 MED ORDER — SODIUM CHLORIDE 0.9 % IV SOLN
20.0000 mg | Freq: Once | INTRAVENOUS | Status: AC
  Administered 2020-12-20: 10:00:00 20 mg via INTRAVENOUS
  Filled 2020-12-20: qty 20

## 2020-12-20 MED ORDER — SODIUM CHLORIDE 0.9 % IV SOLN
80.0000 mg/m2 | Freq: Once | INTRAVENOUS | Status: AC
  Administered 2020-12-20: 11:00:00 138 mg via INTRAVENOUS
  Filled 2020-12-20: qty 23

## 2020-12-20 MED ORDER — SODIUM CHLORIDE 0.9% FLUSH: 10.0000 mL | INTRAVENOUS | Status: DC | PRN

## 2020-12-20 MED ORDER — SODIUM CHLORIDE 0.9 % IV SOLN
180.0000 mg | Freq: Once | INTRAVENOUS | Status: AC
  Administered 2020-12-20: 12:00:00 180 mg via INTRAVENOUS
  Filled 2020-12-20: qty 18

## 2020-12-20 MED ORDER — HEPARIN SOD (PORK) LOCK FLUSH 100 UNIT/ML IV SOLN
500.0000 [IU] | Freq: Once | INTRAVENOUS | Status: AC | PRN
  Administered 2020-12-20: 13:00:00 500 [IU]

## 2020-12-20 MED ORDER — FAMOTIDINE IN NACL 20-0.9 MG/50ML-% IV SOLN
20.0000 mg | Freq: Once | INTRAVENOUS | Status: AC
  Administered 2020-12-20: 10:00:00 20 mg via INTRAVENOUS
  Filled 2020-12-20: qty 50

## 2020-12-20 NOTE — Progress Notes (Signed)
Tolerated infusions w/o adverse reaction.  Alert, in no distress.  VSS.  Discharged ambulatory in stable condition.  

## 2020-12-20 NOTE — Progress Notes (Signed)
ANC 3.7, Hgb 8.9 per verbal clarification from [employer]. CMP within parameters for treatment. Patient received Zarxio 480 mcg on 12/19/20. Vital signs within parameters for treatment. Pulse 101 on arrival. Recheck 81.

## 2020-12-25 ENCOUNTER — Other Ambulatory Visit (HOSPITAL_COMMUNITY): Payer: Self-pay

## 2020-12-25 DIAGNOSIS — C50919 Malignant neoplasm of unspecified site of unspecified female breast: Secondary | ICD-10-CM

## 2020-12-26 ENCOUNTER — Inpatient Hospital Stay (HOSPITAL_COMMUNITY): Payer: BC Managed Care – PPO

## 2020-12-26 ENCOUNTER — Ambulatory Visit (HOSPITAL_COMMUNITY): Payer: BC Managed Care – PPO

## 2020-12-26 ENCOUNTER — Other Ambulatory Visit: Payer: Self-pay

## 2020-12-26 ENCOUNTER — Other Ambulatory Visit (HOSPITAL_COMMUNITY): Payer: BC Managed Care – PPO

## 2020-12-26 VITALS — BP 109/68 | HR 102 | Temp 97.3°F | Resp 18 | Wt 155.6 lb

## 2020-12-26 DIAGNOSIS — Z95828 Presence of other vascular implants and grafts: Secondary | ICD-10-CM

## 2020-12-26 DIAGNOSIS — Z5111 Encounter for antineoplastic chemotherapy: Secondary | ICD-10-CM | POA: Diagnosis not present

## 2020-12-26 DIAGNOSIS — C50919 Malignant neoplasm of unspecified site of unspecified female breast: Secondary | ICD-10-CM

## 2020-12-26 DIAGNOSIS — D702 Other drug-induced agranulocytosis: Secondary | ICD-10-CM

## 2020-12-26 LAB — CBC WITH DIFFERENTIAL/PLATELET
Basophils Absolute: 0 10*3/uL (ref 0.0–0.1)
Basophils Relative: 1 %
Eosinophils Absolute: 0 10*3/uL (ref 0.0–0.5)
Eosinophils Relative: 2 %
HCT: 24.8 % — ABNORMAL LOW (ref 36.0–46.0)
Hemoglobin: 8.3 g/dL — ABNORMAL LOW (ref 12.0–15.0)
Lymphocytes Relative: 52 %
Lymphs Abs: 0.9 10*3/uL (ref 0.7–4.0)
MCH: 32.2 pg (ref 26.0–34.0)
MCHC: 33.5 g/dL (ref 30.0–36.0)
MCV: 96.1 fL (ref 80.0–100.0)
Monocytes Absolute: 0.1 10*3/uL (ref 0.1–1.0)
Monocytes Relative: 3 %
Neutro Abs: 0.7 10*3/uL — ABNORMAL LOW (ref 1.7–7.7)
Neutrophils Relative %: 42 %
Platelets: 33 10*3/uL — ABNORMAL LOW (ref 150–400)
RBC: 2.58 MIL/uL — ABNORMAL LOW (ref 3.87–5.11)
RDW: 16.5 % — ABNORMAL HIGH (ref 11.5–15.5)
WBC: 1.7 10*3/uL — ABNORMAL LOW (ref 4.0–10.5)
nRBC: 0 % (ref 0.0–0.2)

## 2020-12-26 LAB — COMPREHENSIVE METABOLIC PANEL
ALT: 16 U/L (ref 0–44)
AST: 13 U/L — ABNORMAL LOW (ref 15–41)
Albumin: 3.4 g/dL — ABNORMAL LOW (ref 3.5–5.0)
Alkaline Phosphatase: 75 U/L (ref 38–126)
Anion gap: 9 (ref 5–15)
BUN: 12 mg/dL (ref 6–20)
CO2: 22 mmol/L (ref 22–32)
Calcium: 8.5 mg/dL — ABNORMAL LOW (ref 8.9–10.3)
Chloride: 102 mmol/L (ref 98–111)
Creatinine, Ser: 0.75 mg/dL (ref 0.44–1.00)
GFR, Estimated: >60 mL/min (ref >60)
Glucose, Bld: 261 mg/dL — ABNORMAL HIGH (ref 70–99)
Potassium: 3.4 mmol/L — ABNORMAL LOW (ref 3.5–5.1)
Sodium: 133 mmol/L — ABNORMAL LOW (ref 135–145)
Total Bilirubin: 0.3 mg/dL (ref 0.3–1.2)
Total Protein: 6.6 g/dL (ref 6.5–8.1)

## 2020-12-26 MED ORDER — FILGRASTIM-SNDZ 480 MCG/0.8ML IJ SOSY
480.0000 ug | PREFILLED_SYRINGE | Freq: Once | INTRAMUSCULAR | Status: AC
  Administered 2020-12-26: 10:00:00 480 ug via SUBCUTANEOUS
  Filled 2020-12-26: qty 0.8

## 2020-12-26 NOTE — Progress Notes (Signed)
Patients port flushed without difficulty.  Good blood return noted with no bruising or swelling noted at site.  Transparent dressing applied.  Patient left accessed for chemotherapy treatment tomorrow.

## 2020-12-26 NOTE — Addendum Note (Signed)
Addended by: Peggye Pitt on: 12/26/2020 09:26 AM   Modules accepted: Orders

## 2020-12-26 NOTE — Progress Notes (Signed)
Patient tolerated Zarxio injection with no complaints voiced.  Site clean and dry with no bruising or swelling noted.  No complaints of pain.  Discharged with vital signs stable and no signs or symptoms of distress noted.   

## 2020-12-27 ENCOUNTER — Inpatient Hospital Stay (HOSPITAL_COMMUNITY): Payer: BC Managed Care – PPO

## 2020-12-27 ENCOUNTER — Inpatient Hospital Stay (HOSPITAL_BASED_OUTPATIENT_CLINIC_OR_DEPARTMENT_OTHER): Payer: BC Managed Care – PPO | Admitting: Hematology

## 2020-12-27 VITALS — BP 112/67 | HR 96 | Temp 96.8°F | Resp 18 | Wt 156.1 lb

## 2020-12-27 DIAGNOSIS — Z95828 Presence of other vascular implants and grafts: Secondary | ICD-10-CM

## 2020-12-27 DIAGNOSIS — C50919 Malignant neoplasm of unspecified site of unspecified female breast: Secondary | ICD-10-CM

## 2020-12-27 DIAGNOSIS — Z5111 Encounter for antineoplastic chemotherapy: Secondary | ICD-10-CM | POA: Diagnosis not present

## 2020-12-27 LAB — CBC WITH DIFFERENTIAL/PLATELET
Band Neutrophils: 2 %
Basophils Absolute: 0.1 10*3/uL (ref 0.0–0.1)
Basophils Relative: 2 %
Eosinophils Absolute: 0.1 10*3/uL (ref 0.0–0.5)
Eosinophils Relative: 2 %
HCT: 25.1 % — ABNORMAL LOW (ref 36.0–46.0)
Hemoglobin: 8.3 g/dL — ABNORMAL LOW (ref 12.0–15.0)
Lymphocytes Relative: 21 %
Lymphs Abs: 0.8 10*3/uL (ref 0.7–4.0)
MCH: 31.8 pg (ref 26.0–34.0)
MCHC: 33.1 g/dL (ref 30.0–36.0)
MCV: 96.2 fL (ref 80.0–100.0)
Metamyelocytes Relative: 4 %
Monocytes Absolute: 0 10*3/uL — ABNORMAL LOW (ref 0.1–1.0)
Monocytes Relative: 1 %
Neutro Abs: 2.8 10*3/uL (ref 1.7–7.7)
Neutrophils Relative %: 68 %
Platelets: 30 10*3/uL — ABNORMAL LOW (ref 150–400)
RBC: 2.61 MIL/uL — ABNORMAL LOW (ref 3.87–5.11)
RDW: 16.8 % — ABNORMAL HIGH (ref 11.5–15.5)
WBC: 4 10*3/uL (ref 4.0–10.5)
nRBC: 0 % (ref 0.0–0.2)

## 2020-12-27 MED ORDER — HEPARIN SOD (PORK) LOCK FLUSH 100 UNIT/ML IV SOLN
500.0000 [IU] | Freq: Once | INTRAVENOUS | Status: AC
  Administered 2020-12-27: 500 [IU] via INTRAVENOUS

## 2020-12-27 MED ORDER — SODIUM CHLORIDE 0.9% FLUSH
10.0000 mL | Freq: Once | INTRAVENOUS | Status: AC
  Administered 2020-12-27: 10 mL via INTRAVENOUS

## 2020-12-27 NOTE — Patient Instructions (Signed)
[facility] Discharge Instructions for Patients Receiving Chemotherapy  Today you received the following chemotherapy agents   To help prevent nausea and vomiting after your treatment, we encourage you to take your nausea medication   If you develop nausea and vomiting that is not controlled by your nausea medication, call the clinic.   BELOW ARE SYMPTOMS THAT SHOULD BE REPORTED IMMEDIATELY:  *FEVER GREATER THAN 100.5 F  *CHILLS WITH OR WITHOUT FEVER  NAUSEA AND VOMITING THAT IS NOT CONTROLLED WITH YOUR NAUSEA MEDICATION  *UNUSUAL SHORTNESS OF BREATH  *UNUSUAL BRUISING OR BLEEDING  TENDERNESS IN MOUTH AND THROAT WITH OR WITHOUT PRESENCE OF ULCERS  *URINARY PROBLEMS  *BOWEL PROBLEMS  UNUSUAL RASH Items with * indicate a potential emergency and should be followed up as soon as possible.  Feel free to call the clinic should you have any questions or concerns. The clinic phone number is [phone].  Please show the CHEMO ALERT CARD at check-in to the Emergency Department and triage nurse.   

## 2020-12-27 NOTE — Progress Notes (Signed)
Patients port flushed without difficulty.  Good blood return noted with no bruising or swelling noted at site.  Transparent dressing in place.  Patient was accessed yesterday (12/26/20).  Patient remains accessed for chemotherapy treatment.

## 2020-12-27 NOTE — Patient Instructions (Signed)
[facility] at [facility] Discharge Instructions  You were seen today by Dr. Ellin Saba. He went over your recent results. You did not receive your treatment today due to your platelets being low. You may proceed with getting your scans and surgery with Dr. Henreitta Leber. Dr. Ellin Saba will see you back in 8 weeks (or 4 weeks after surgery) for follow up.   Thank you for choosing  Cancer Center at [facility] to provide your oncology and hematology care.  To afford each patient quality time with our provider, please arrive at least 15 minutes before your scheduled appointment time.   If you have a lab appointment with the Cancer Center please come in thru the Main Entrance and check in at the main information desk  You need to re-schedule your appointment should you arrive 10 or more minutes late.  We strive to give you quality time with our providers, and arriving late affects you and other patients whose appointments are after yours.  Also, if you no show three or more times for appointments you may be dismissed from the clinic at the providers discretion.     Again, thank you for choosing [facility].  Our hope is that these requests will decrease the amount of time that you wait before being seen by our physicians.       _____________________________________________________________  Should you have questions after your visit to [facility], please contact our office at [phone] between the hours of 8:00 a.m. and 4:30 p.m.  Voicemails left after 4:00 p.m. will not be returned until the following business day.  For prescription refill requests, have your pharmacy contact our office and allow 72 hours.    Cancer Center Support Programs:   > Cancer Support Group  2nd Tuesday of the month 1pm-2pm, Journey Room

## 2020-12-27 NOTE — Progress Notes (Signed)
[facility] [address] [place] [address]   CLINIC:  Medical Oncology/Hematology  PCP:  Babs Sciara, MD [address] / [place] [address] [phone]   REASON FOR VISIT:  Follow-up for left breast cancer  PRIOR THERAPY:  1. AC x 4 cycles from 08/01/2020 to 09/15/2020. 2. Carboplatin and paclitaxel weekly x 4 cycles from 10/02/2020 to 12/26/2020.  NGS Results: ER/PR/HER-2 negative  CURRENT THERAPY: Observation  BRIEF ONCOLOGIC HISTORY:  Oncology History  Triple negative malignant neoplasm of breast (HCC)  07/05/2020 Initial Diagnosis   Triple negative malignant neoplasm of breast (HCC)   08/01/2020 -  Chemotherapy   The patient had DOXOrubicin (ADRIAMYCIN) chemo injection 104 mg, 60 mg/m2 = 104 mg, Intravenous,  Once, 4 of 4 cycles Administration: 104 mg (08/01/2020), 104 mg (08/14/2020), 104 mg (09/01/2020), 104 mg (09/15/2020) palonosetron (ALOXI) injection 0.25 mg, 0.25 mg, Intravenous,  Once, 8 of 8 cycles Administration: 0.25 mg (08/01/2020), 0.25 mg (10/02/2020), 0.25 mg (08/14/2020), 0.25 mg (09/01/2020), 0.25 mg (09/15/2020), 0.25 mg (10/23/2020), 0.25 mg (11/13/2020), 0.25 mg (12/13/2020) pegfilgrastim (NEULASTA ONPRO KIT) injection 6 mg, 6 mg, Subcutaneous, Once, 1 of 1 cycle Administration: 6 mg (09/01/2020) pegfilgrastim-jmdb (FULPHILA) injection 6 mg, 6 mg, Subcutaneous,  Once, 3 of 3 cycles Administration: 6 mg (08/03/2020), 6 mg (08/16/2020), 6 mg (09/18/2020) CARBOplatin (PARAPLATIN) 240 mg in sodium chloride 0.9 % 250 mL chemo infusion, 240 mg (98.1 % of original dose 247 mg), Intravenous,  Once, 4 of 4 cycles Dose modification:   (original dose 247 mg, Cycle 5, Reason: Provider Judgment),   (original dose 247 mg, Cycle 6, Reason: Provider Judgment) Administration: 240 mg (10/02/2020), 240 mg (10/09/2020), 230 mg (10/23/2020), 180 mg (11/13/2020), 180 mg (12/13/2020), 230 mg (10/30/2020), 190 mg (11/06/2020), 180 mg (11/29/2020), 210 mg  (10/16/2020), 180 mg (12/06/2020), 180 mg (12/20/2020) cyclophosphamide (CYTOXAN) 1,040 mg in sodium chloride 0.9 % 250 mL chemo infusion, 600 mg/m2 = 1,040 mg, Intravenous,  Once, 4 of 4 cycles Administration: 1,040 mg (08/01/2020), 1,040 mg (08/14/2020), 1,040 mg (09/01/2020), 1,040 mg (09/15/2020) PACLitaxel (TAXOL) 138 mg in sodium chloride 0.9 % 250 mL chemo infusion (</= 80mg /m2), 80 mg/m2 = 138 mg, Intravenous,  Once, 4 of 4 cycles Administration: 138 mg (10/02/2020), 138 mg (11/29/2020), 138 mg (10/09/2020), 138 mg (10/23/2020), 138 mg (10/30/2020), 138 mg (11/13/2020), 138 mg (12/13/2020), 138 mg (11/06/2020), 138 mg (10/16/2020), 138 mg (12/06/2020), 138 mg (12/20/2020) fosaprepitant (EMEND) 150 mg in sodium chloride 0.9 % 145 mL IVPB, 150 mg, Intravenous,  Once, 4 of 4 cycles Administration: 150 mg (08/01/2020), 150 mg (08/14/2020), 150 mg (09/01/2020), 150 mg (09/15/2020)  for chemotherapy treatment.    08/17/2020 Genetic Testing   Negative genetic testing:  No pathogenic variants detected on the [facility] CustomNext-Cancer + RNAinsight panel. A variant of uncertain significance (VUS) was detected in the MSH2 gene called c.1600C>T (p.R534C). The report date is 08/17/2020.  The CustomNext-Cancer+RNAinsight panel offered by 08/19/2020 includes sequencing and rearrangement analysis for up to 91 genes, which included the following 47 genes for Ms. Trickett:  APC*, ATM*, AXIN2, BARD1, BMPR1A, BRCA1*, BRCA2*, BRIP1*, CDH1*, CDK4, CDKN2A, CHEK2*, DICER1, EPCAM, GREM1, HOXB13, MEN1, MLH1*, MSH2*, MSH3, MSH6*, MUTYH*, NBN, NF1*, NF2, NTHL1, PALB2*, PMS2*, POLD1, POLE, PTEN*, RAD51C*, RAD51D*, RECQL, RET, SDHA, SDHAF2, SDHB, SDHC, SDHD, SMAD4, SMARCA4, STK11, TP53*, TSC1, TSC2, and VHL.  DNA and RNA analyses performed for * genes.     Drug-induced neutropenia (HCC)    CANCER STAGING: Cancer Staging No  matching staging information was found for the patient.  INTERVAL HISTORY:  Ms. CAILEEN VERACRUZ, a 54 y.o. female,  returns for routine follow-up and consideration for next cycle of chemotherapy. Sammye was last seen on 11/29/2020.  Due for day #15 of cycle #8 of carboplatin and paclitaxel today.   Today she is accompanied by her husband. Overall, she tells me she has been feeling pretty well. She continues having nasal congestion which lasted for the past 3 weeks and occasional bleeding when blowing her nose. She denies having numbness or tingling, hematuria or hematochezia. She already saw Dr. Constance Haw to have her surgery scheduled. Her appetite is good and she reports having only 1 episode of vomiting, but denies consistent N/V.  She will stop treatments and proceed with her surgery.   REVIEW OF SYSTEMS:  Review of Systems  Constitutional: Positive for appetite change (25%) and fatigue (50%).  HENT:   Positive for nosebleeds (when blowing nose).   Respiratory: Positive for cough and shortness of breath.   Gastrointestinal: Negative for blood in stool, nausea and vomiting.  Genitourinary: Negative for hematuria.   Neurological: Positive for dizziness. Negative for numbness.  All other systems reviewed and are negative.   PAST MEDICAL/SURGICAL HISTORY:  Past Medical History:  Diagnosis Date  . Asthma   . Cancer ([place])    cervical - 20 yrs ago  . Carpal tunnel syndrome   . Depression   . Family history of breast cancer   . Family history of esophageal cancer   . Family history of lung cancer   . Family history of throat cancer   . Hypertension   . Hypokalemia    tx w/ k-Dur  . Port-A-Cath in place 07/24/2020  . Seasonal allergies    tx with benadryl prn  . SVD (spontaneous vaginal delivery)    x 2   Past Surgical History:  Procedure Laterality Date  . ABDOMINAL HYSTERECTOMY    . CO2 Laser of Cervix     for cervical cancer 20 yrs ago  . DILATION AND CURETTAGE OF UTERUS  12/2010   polyp removed  . DILATION AND CURETTAGE OF UTERUS     x 3 for MAB  . LAPAROSCOPIC ASSISTED VAGINAL  HYSTERECTOMY  06/09/2012   Procedure: LAPAROSCOPIC ASSISTED VAGINAL HYSTERECTOMY;  Surgeon: Gus Height, MD;  Location: [facility];  Service: Gynecology;  Laterality: N/A;  . PORTACATH PLACEMENT Right 07/17/2020   Procedure: INSERTION PORT-A-CATH;  Surgeon: Virl Cagey, MD;  Location: [facility];  Service: General;  Laterality: Right;  . SALPINGOOPHORECTOMY  06/09/2012   Procedure: SALPINGO OOPHERECTOMY;  Surgeon: Gus Height, MD;  Location: [facility];  Service: Gynecology;  Laterality: Bilateral;  . TUBAL LIGATION      SOCIAL HISTORY:  Social History   Socioeconomic History  . Marital status: Married    Spouse name: Not on file  . Number of children: 2  . Years of education: Not on file  . Highest education level: Not on file  Occupational History  . Occupation: EMPLOYED  Tobacco Use  . Smoking status: Former Smoker    Packs/day: 1.00    Years: 16.00    Pack years: 16.00    Types: Cigarettes    Start date: 05/30/1994    Quit date: 01/31/2020    Years since quitting: 0.9  . Smokeless tobacco: Never Used  Vaping Use  . Vaping Use: Never used  Substance and Sexual Activity  . Alcohol use: No  . Drug use: No  .  Sexual activity: Yes    Birth control/protection: Surgical  Other Topics Concern  . Not on file  Social History Narrative  . Not on file   Social Determinants of Health   Financial Resource Strain: Low Risk   . Difficulty of Paying Living Expenses: Not very hard  Food Insecurity: No Food Insecurity  . Worried About Charity fundraiser in the Last Year: Never true  . Ran Out of Food in the Last Year: Never true  Transportation Needs: No Transportation Needs  . Lack of Transportation (Medical): No  . Lack of Transportation (Non-Medical): No  Physical Activity: Inactive  . Days of Exercise per Week: 0 days  . Minutes of Exercise per Session: 0 min  Stress: Stress Concern Present  . Feeling of Stress : To some extent  Social Connections: Moderately Isolated  . Frequency  of Communication with Friends and Family: Three times a week  . Frequency of Social Gatherings with Friends and Family: Once a week  . Attends Religious Services: Never  . Active Member of Clubs or Organizations: No  . Attends Archivist Meetings: Never  . Marital Status: Married  Human resources officer Violence: Not At Risk  . Fear of Current or Ex-Partner: No  . Emotionally Abused: No  . Physically Abused: No  . Sexually Abused: No    FAMILY HISTORY:  Family History  Problem Relation Age of Onset  . Heart disease Mother   . Lung cancer Mother 20       smoker  . Heart disease Father   . Esophageal cancer Father        GE junction, dx. in his early 72s  . Diverticulitis Sister   . Diabetes Sister   . Diabetes Maternal Grandmother   . Heart disease Maternal Grandmother   . Throat cancer Maternal Grandmother        dx. late 26s  . Healthy Sister   . Healthy Daughter   . Healthy Son   . Breast cancer Paternal Aunt        limited info    CURRENT MEDICATIONS:  Current Outpatient Medications  Medication Sig Dispense Refill  . atorvastatin (LIPITOR) 20 MG tablet TAKE 1 TABLET BY MOUTH EVERY DAY 90 tablet 1  . esomeprazole (NEXIUM) 20 MG capsule Take 20 mg by mouth daily before breakfast.    . lisinopril (ZESTRIL) 5 MG tablet TAKE 1 TABLET BY MOUTH EVERY DAY 90 tablet 1  . potassium chloride (KLOR-CON) 10 MEQ tablet Take 1 tablet (10 mEq total) by mouth daily. 30 tablet 2  . sertraline (ZOLOFT) 50 MG tablet TAKE 1 TABLET BY MOUTH EVERY DAY 90 tablet 0  . albuterol (VENTOLIN HFA) 108 (90 Base) MCG/ACT inhaler TAKE 2 PUFFS BY MOUTH EVERY 6 HOURS AS NEEDED FOR WHEEZE OR SHORTNESS OF BREATH (Patient not taking: Reported on 12/27/2020) 8.5 Inhaler 0  . diphenhydrAMINE (BENADRYL) 25 mg capsule Take 25 mg by mouth every 6 (six) hours as needed for allergies. Allergies. (Patient not taking: Reported on 12/27/2020)    . ibuprofen (ADVIL,MOTRIN) 200 MG tablet Take 400-600 mg by mouth  every 8 (eight) hours as needed for moderate pain (pain.).  (Patient not taking: Reported on 12/27/2020)    . lidocaine-prilocaine (EMLA) cream Apply a pea sized amount to port a cath site and cover with plastic wrap 1 hour prior to chemotherapy appointments (Patient not taking: Reported on 12/27/2020) 30 g 3  . naproxen sodium (ALEVE) 220 MG tablet Take 440 mg  by mouth 3 (three) times daily as needed (pain.).  (Patient not taking: Reported on 12/27/2020)    . prochlorperazine (COMPAZINE) 10 MG tablet Take 1 tablet (10 mg total) by mouth every 6 (six) hours as needed (Nausea or vomiting). (Patient not taking: Reported on 12/27/2020) 30 tablet 1   No current facility-administered medications for this visit.    ALLERGIES:  No Known Allergies  PHYSICAL EXAM:  Performance status (ECOG): 0 - Asymptomatic  Vitals:   12/27/20 0934  BP: 112/67  Pulse: 96  Resp: 18  Temp: (!) 96.8 F (36 C)  SpO2: 99%   Wt Readings from Last 3 Encounters:  12/27/20 156 lb 1.6 oz (70.8 kg)  12/26/20 155 lb 9.6 oz (70.6 kg)  12/20/20 156 lb 8 oz (71 kg)   Physical Exam Vitals reviewed.  Constitutional:      Appearance: Normal appearance. She is obese.  Cardiovascular:     Rate and Rhythm: Normal rate and regular rhythm.     Pulses: Normal pulses.     Heart sounds: Normal heart sounds.  Pulmonary:     Effort: Pulmonary effort is normal.     Breath sounds: Normal breath sounds.  Chest:  Breasts:     Left: No swelling, bleeding, inverted nipple, mass, nipple discharge, skin change, tenderness, axillary adenopathy or supraclavicular adenopathy.      Comments: Port-a-Cath in R chest Lymphadenopathy:     Upper Body:     Left upper body: No supraclavicular, axillary or pectoral adenopathy.  Neurological:     General: No focal deficit present.     Mental Status: She is alert and oriented to person, place, and time.  Psychiatric:        Mood and Affect: Mood normal.        Behavior: Behavior normal.      LABORATORY DATA:  I have reviewed the labs as listed.  CBC Latest Ref Rng & Units 12/27/2020 12/26/2020 12/20/2020  WBC 4.0 - 10.5 K/uL 4.0 1.7(L) 4.9  Hemoglobin 12.0 - 15.0 g/dL 8.3(L) 8.3(L) 8.9(L)  Hematocrit 36.0 - 46.0 % 25.1(L) 24.8(L) 27.2(L)  Platelets 150 - 400 K/uL 30(L) 33(L) 60(L)   CMP Latest Ref Rng & Units 12/26/2020 12/20/2020 12/19/2020  Glucose 70 - 99 mg/dL 261(H) 193(H) 230(H)  BUN 6 - 20 mg/dL $Remove'12 13 12  'OramzTd$ Creatinine 0.44 - 1.00 mg/dL 0.75 0.87 0.81  Sodium 135 - 145 mmol/L 133(L) 135 132(L)  Potassium 3.5 - 5.1 mmol/L 3.4(L) 3.4(L) 3.3(L)  Chloride 98 - 111 mmol/L 102 101 99  CO2 22 - 32 mmol/L $RemoveB'22 24 23  'QTHrsdHg$ Calcium 8.9 - 10.3 mg/dL 8.5(L) 8.6(L) 8.5(L)  Total Protein 6.5 - 8.1 g/dL 6.6 7.0 6.8  Total Bilirubin 0.3 - 1.2 mg/dL 0.3 0.4 0.3  Alkaline Phos 38 - 126 U/L 75 81 74  AST 15 - 41 U/L 13(L) 21 25  ALT 0 - 44 U/L $Remo'16 25 27    'KtZwx$ DIAGNOSTIC IMAGING:  I have independently reviewed the scans and discussed with the patient. No results found.   ASSESSMENT:  1. Triple negative left breast cancer (T2N1): -Patient identified left breast mass with left nipple retraction about 2 months ago. -Mammogram on 06/13/2020 showed mass in the retroareolar 12 o'clock position in the left breast. 2 suspicious left axillary lymph nodes, largest measuring 2 cm. -Breast ultrasound showed 2.7 x 1.6 x 2.2 cm mass in the 12:00 retroareolar position. Just lateral to the mass is a calcified hypoechoic nodule that corresponds to densely  calcified degenerating fibroadenoma. -Left breast 12:00 biopsy showed invasive ductal carcinoma, grade 2/3. Left axillary lymph node biopsy was consistent with meta stasis. -ER negative, PR negative and Ki-67 15%. HER-2 equivocal by IHC. HER-2 FISH negative. -MRI of the breast on 07/19/2020 shows 4.2 cm enhancing mass in the subareolar left breast, enhancement extending to the level of the left nipple with associated nipple retraction and  periareolar skin thickening. 6 morphologically abnormal level 1 left axillary lymph nodes. No evidence of malignancy in the right breast. -CT CAP on 07/14/2020 shows dominant left axillary nodes measuring 1.8 cm short axis. Small left subpectoral nodes measuring up to 7 mm short axis, suspicious. Small mediastinal lymph nodes measuring 7 to 9 mm, thought to be reactive. No other evidence of metastatic disease. -NeoadjuvantDose dense ACstarted on 08/01/2020. -11 cycles of carboplatin and Taxol completed on 12/20/2020.  She has completed  2. Family history: -Father had gastroesophageal junction cancer. Maternal grandmother had throat cancer and paternal aunt had breast cancer. -[facility] genetics testing showed MSH2VUS.  3. High risk drug monitoring: -Echo on 07/12/2020 shows EF 60 to 65%.   PLAN:  1. T2N1 left breast TNBC: -She has completed 11 weekly dose of paclitaxel and carboplatin. -Reviewed labs today.  She has received G-CSF with improvement of white count 4.0.  However her platelet count is low at 30. -I would hold off on treatment today and contact Dr. Constance Haw to proceed with her surgical planning. -I will see her back in 4 weeks after surgery to discuss pathology and further plan.  2. Family history: -Genetic testing was negative.  3. Hypokalemia: -Continue potassium 10 mEq daily.   Orders placed this encounter:  No orders of the defined types were placed in this encounter.    Derek Jack, MD [place] [phone]   I, Milinda Antis, am acting as a scribe for Dr. Sanda Linger.  I, Derek Jack MD, have reviewed the above documentation for accuracy and completeness, and I agree with the above.

## 2020-12-27 NOTE — Progress Notes (Signed)
Patient presents today for Taxol and Carboplatin.  Vital signs within parameters for treatment.  Labs pending.  Patient has no new complaints but does state that she has had one nose bleed this week.   Labs reviewed, Platelets 30.  Verbal order for Dr. Ellin Saba, No treatment today, due to Platelet count.  Patient deaccessed. Vital signs stable.  No complaints at this time.  Discharge from clinic ambulatory in stable condition.  Alert and oriented X 3.  Follow up with [facility] as scheduled.

## 2021-01-09 ENCOUNTER — Other Ambulatory Visit: Payer: Self-pay

## 2021-01-09 ENCOUNTER — Ambulatory Visit (INDEPENDENT_AMBULATORY_CARE_PROVIDER_SITE_OTHER): Payer: 59 | Admitting: General Surgery

## 2021-01-09 ENCOUNTER — Encounter: Payer: Self-pay | Admitting: General Surgery

## 2021-01-09 VITALS — BP 100/58 | HR 98 | Temp 97.3°F | Resp 14 | Ht <= 58 in | Wt 161.0 lb

## 2021-01-09 DIAGNOSIS — Z171 Estrogen receptor negative status [ER-]: Secondary | ICD-10-CM

## 2021-01-09 DIAGNOSIS — C50012 Malignant neoplasm of nipple and areola, left female breast: Secondary | ICD-10-CM

## 2021-01-09 NOTE — Progress Notes (Signed)
[facility] History and Physical   Chief Complaint    Follow-up      Elizabeth Graves is a 55 y.o. female.  HPI: Elizabeth Graves is a 55 yo who is known to me after having a triple negative breast cancer on the left that T2N1 who has undergone neoadjuvant therapy. Clinically no mass is noted and no adenopathy. She has not had a repeat MRI. She has completed treatment and has some resolving thrombocytopenia.  She says she is doing well and wants to proceed with surgery.  She is having no complaints.   Past Medical History:  Diagnosis Date  . Asthma   . Cancer ([place])    cervical - 20 yrs ago  . Carpal tunnel syndrome   . Depression   . Family history of breast cancer   . Family history of esophageal cancer   . Family history of lung cancer   . Family history of throat cancer   . Hypertension   . Hypokalemia    tx w/ k-Dur  . Port-A-Cath in place 07/24/2020  . Seasonal allergies    tx with benadryl prn  . SVD (spontaneous vaginal delivery)    x 2    Past Surgical History:  Procedure Laterality Date  . ABDOMINAL HYSTERECTOMY    . CO2 Laser of Cervix     for cervical cancer 20 yrs ago  . DILATION AND CURETTAGE OF UTERUS  12/2010   polyp removed  . DILATION AND CURETTAGE OF UTERUS     x 3 for MAB  . LAPAROSCOPIC ASSISTED VAGINAL HYSTERECTOMY  06/09/2012   Procedure: LAPAROSCOPIC ASSISTED VAGINAL HYSTERECTOMY;  Surgeon: Gus Height, MD;  Location: [facility];  Service: Gynecology;  Laterality: N/A;  . PORTACATH PLACEMENT Right 07/17/2020   Procedure: INSERTION PORT-A-CATH;  Surgeon: Virl Cagey, MD;  Location: [facility];  Service: General;  Laterality: Right;  . SALPINGOOPHORECTOMY  06/09/2012   Procedure: SALPINGO OOPHERECTOMY;  Surgeon: Gus Height, MD;  Location: [facility];  Service: Gynecology;  Laterality: Bilateral;  . TUBAL LIGATION      Family History  Problem Relation Age of Onset  . Heart disease Mother   . Lung cancer Mother 6       smoker  . Heart disease  Father   . Esophageal cancer Father        GE junction, dx. in his early 14s  . Diverticulitis Sister   . Diabetes Sister   . Diabetes Maternal Grandmother   . Heart disease Maternal Grandmother   . Throat cancer Maternal Grandmother        dx. late 18s  . Healthy Sister   . Healthy Daughter   . Healthy Son   . Breast cancer Paternal Aunt        limited info    Social History   Tobacco Use  . Smoking status: Former Smoker    Packs/day: 1.00    Years: 16.00    Pack years: 16.00    Types: Cigarettes    Start date: 05/30/1994    Quit date: 01/31/2020    Years since quitting: 0.9  . Smokeless tobacco: Never Used  Vaping Use  . Vaping Use: Never used  Substance Use Topics  . Alcohol use: No  . Drug use: No    Medications: I have reviewed the patient's current medications. Allergies as of 01/09/2021   No Known Allergies     Medication List       Accurate as of January 09, 2021  1:01 PM. If you have any questions, ask your nurse or doctor.        albuterol 108 (90 Base) MCG/ACT inhaler Commonly known as: VENTOLIN HFA TAKE 2 PUFFS BY MOUTH EVERY 6 HOURS AS NEEDED FOR WHEEZE OR SHORTNESS OF BREATH   atorvastatin 20 MG tablet Commonly known as: LIPITOR TAKE 1 TABLET BY MOUTH EVERY DAY   diphenhydrAMINE 25 mg capsule Commonly known as: BENADRYL Take 25 mg by mouth every 6 (six) hours as needed for allergies. Allergies.   esomeprazole 20 MG capsule Commonly known as: NEXIUM Take 20 mg by mouth daily before breakfast.   ibuprofen 200 MG tablet Commonly known as: ADVIL Take 400-600 mg by mouth every 8 (eight) hours as needed for moderate pain (pain.).   lidocaine-prilocaine cream Commonly known as: EMLA Apply a pea sized amount to port a cath site and cover with plastic wrap 1 hour prior to chemotherapy appointments   lisinopril 5 MG tablet Commonly known as: ZESTRIL TAKE 1 TABLET BY MOUTH EVERY DAY   naproxen sodium 220 MG tablet Commonly known as:  ALEVE Take 440 mg by mouth 3 (three) times daily as needed (pain.).   potassium chloride 10 MEQ tablet Commonly known as: KLOR-CON Take 1 tablet (10 mEq total) by mouth daily.   prochlorperazine 10 MG tablet Commonly known as: COMPAZINE Take 1 tablet (10 mg total) by mouth every 6 (six) hours as needed (Nausea or vomiting).   sertraline 50 MG tablet Commonly known as: ZOLOFT TAKE 1 TABLET BY MOUTH EVERY DAY        ROS:  A comprehensive review of systems was negative except for: Integument/breast: positive for left sided breast cancer  Blood pressure (!) 100/58, pulse 98, temperature (!) 97.3 F (36.3 C), temperature source Other (Comment), resp. rate 14, height 4\' 9"  (1.448 m), weight 161 lb (73 kg), SpO2 97 %. Physical Exam Vitals reviewed.  Constitutional:      Appearance: Normal appearance.  HENT:     Head: Normocephalic.     Nose: Nose normal.     Mouth/Throat:     Mouth: Mucous membranes are moist.  Eyes:     Extraocular Movements: Extraocular movements intact.  Cardiovascular:     Rate and Rhythm: Normal rate and regular rhythm.  Pulmonary:     Effort: Pulmonary effort is normal.     Breath sounds: Normal breath sounds.  Chest:     Comments: No mass in the left breast, nipple is no longer retracting, no apparent adenopathy on left side  Abdominal:     General: There is no distension.     Palpations: Abdomen is soft.     Tenderness: There is no abdominal tenderness.  Musculoskeletal:        General: Normal range of motion.     Cervical back: Normal range of motion.  Skin:    General: Skin is warm.  Neurological:     General: No focal deficit present.     Mental Status: She is alert and oriented to person, place, and time.  Psychiatric:        Mood and Affect: Mood normal.        Behavior: Behavior normal.        Thought Content: Thought content normal.        Judgment: Judgment normal.     Results: None  CLINICAL DATA:  54 year old female with  newly diagnosed left breast invasive ductal carcinoma and left axillary metastases.  LABS:  None performed on site.  EXAM: BILATERAL BREAST MRI WITH AND WITHOUT CONTRAST  TECHNIQUE: Multiplanar, multisequence MR images of both breasts were obtained prior to and following the intravenous administration of 8 ml of Gadavist.  Three-dimensional MR images were rendered by post-processing of the original MR data on an independent workstation. The three-dimensional MR images were interpreted, and findings are reported in the following complete MRI report for this study. Three dimensional images were evaluated at the independent DynaCad workstation.  COMPARISON:  Previous exam(s).  FINDINGS: Breast composition: c. Heterogeneous fibroglandular tissue.  Background parenchymal enhancement: Moderate.  Right breast: No suspicious mass or abnormal enhancement.  Left breast: Susceptibility artifact from post biopsy clip is seen in association with an irregular, enhancing mass in the subareolar left breast (series 6, image 56/112). This is consistent with the patient's biopsy-proven malignancy. The mass measures 4.2 x 3.5 x 3.5 cm. Enhancement from the mass extends to the level of the overlying left nipple which is retracted. There is mild skin thickening along the periareolar aspect. Otherwise, no suspicious mass or abnormal enhancement in the remainder of the left breast.  Lymph nodes: There are 6 morphologically abnormal level I left axillary lymph nodes, one of which demonstrates susceptibility artifact from post biopsy clip. No enlarged level II or III left axillary lymph nodes noted within the limits of this study. No suspicious internal mammary chain or right axillary lymph nodes.  Ancillary findings:  None.  IMPRESSION: 1. 4.2 cm enhancing mass in the subareolar left breast consistent with the patient's biopsy-proven site of malignancy. Enhancement extends to the  level of the left nipple with associated nipple retraction and periareolar skin thickening, suggesting involvement. 2. Six morphologically abnormal level I left axillary lymph nodes. 3. No MRI evidence of malignancy on the right.  RECOMMENDATION: Per clinical treatment plan.  BI-RADS CATEGORY  6: Known biopsy-proven malignancy.   Electronically Signed   By: Kristopher Oppenheim M.D.   On: 07/19/2020 09:43 Assessment & Plan:  AMMA GIRDLER is a 55 y.o. female with Triple negative breast cancer on the left s/p neoadjuvant therapy. Her pre treatment imaging with MRI demonstrated six abnormal appearing lymph nodes. I have discussed the option of MRI for planning our surgical options especially if she wants a breast conservation but I do think given the abnormal lymph nodes on the initial MRI that a full axillary dissection is likely needed.  Discussed that some people are doing sentinel nodes and removing the clipped nodes at the time of surgery, but this can result in a false negative finding if sufficient number of lymph nodes are not removed or the clipped node is not located, and this is especially concerning given her previous MRI with concern for multiple nodes.  She reports that she does not want to do radiation post surgery.   -Will proceed with modified radical mastectomy on the left.  We have discussed the risk of surgery including bleeding, risk of infection, and risk of lymph edema from the lymph node dissection. We have discussed the need for an overnight stay with a mastectomy and a drain that will remain in place for about 1 week.  Discussed that she could need additional treatment.   Discussed preop COVID testing.   All questions were answered to the satisfaction of the patient.   Virl Cagey 01/09/2021, 1:01 PM

## 2021-01-09 NOTE — Patient Instructions (Signed)
Will try to schedule for 01/30/2021.   Total or Modified Radical Mastectomy A total mastectomy and a modified radical mastectomy are surgeries that are done as part of treatment for breast cancer. You will have one of those types of surgery. Both types involve removing a breast.  In a total mastectomy (simple mastectomy), all breast tissue including the nipple will be removed.  In a modified radical mastectomy, lymph nodes under the arm will be removed along with the breast and nipple. Some of the lining over the muscle tissues under the breast may also be removed. These procedures may also be used to help prevent breast cancer. A preventive (prophylactic) mastectomy may be done if you are at an increased risk of breast cancer due to harmful changes (mutations) in certain genes (BRCA genes). In that case, the procedure involves removing both of your breasts. This can reduce your risk of developing breast cancer in the future. For a transgender person, a total mastectomy may be done as part of a surgical transition from female to female. Let your health care provider know about:  Any allergies you have.  All medicines you are taking, including vitamins, herbs, eye drops, creams, and over-the-counter medicines.  Any problems you or family members have had with anesthetic medicines.  Any blood disorders you have.  Any surgeries you have had.  Any medical conditions you have.  Whether you are pregnant or may be pregnant. What are the risks? Generally, this is a safe procedure. However, problems may occur, including:  Pain.  Infection.  Bleeding.  Allergic reactions to medicines.  Scar tissue.  Chest numbness on the side of the surgery.  Fluid buildup under the skin flaps where your breast was removed (seroma).  Sensation of throbbing or tingling.  Stress or sadness from losing your breast. If you have the lymph nodes under your arm removed, you may have arm swelling, weakness,  or numbness on the same side of your body as your surgery.  Medicines  Ask your health care provider about: ? Changing or stopping your regular medicines. This is especially important if you are taking diabetes medicines or blood thinners. ? Taking medicines such as aspirin and ibuprofen. These medicines can thin your blood. Do not take these medicines unless your health care provider tells you to take them. ? Taking over-the-counter medicines, vitamins, herbs, and supplements.  Your health care team may give you antibiotic medicine to help prevent infection. General instructions  You may be checked for extra fluid around your lymph nodes (lymphedema).  Plan to have someone take you home from the hospital or clinic.  Plan to have a responsible adult care for you for at least 24 hours after you leave the hospital or clinic. This is important.  Ask your health care provider how your surgical site will be marked or identified.  You may be asked to shower with a germ-killing soap. What happens during the procedure?  To lower your risk of infection: ? Your health care team will wash or sanitize their hands. ? Your skin will be washed with soap.  An IV will be inserted into one of your veins.  You will be given a medicine to make you fall asleep (general anesthetic).  A wide incision will be made around your nipple. The skin and nipple inside the incision will be removed along with all breast tissue.  If you are having a modified radical mastectomy: ? The lining over your chest muscles will be removed. ?  The incision may be extended to reach the lymph nodes under your arm, or a second incision may be made. ? Lymph nodes will be removed.  Breast tissue and lymph nodes that are removed will be sent to the lab for testing.  You may have a drainage tube inserted into your incision to collect fluid that builds up after surgery. This tube will be connected to a suction bulb on the outside  of your body to remove the fluid.  Your incision or incisions will be closed with stitches (sutures).  A bandage (dressing) will be placed over your breast area. If lymph nodes were removed, a dressing will also be placed under your arm. The procedure may vary among health care providers and hospitals. What happens after the procedure?  Your blood pressure, heart rate, breathing rate, and blood oxygen level will be monitored until the medicines you were given have worn off.  You will be given pain medicine as needed.  You will be encouraged to get up and walk as soon as you can.  Your IV can be removed when you are able to eat and drink.  You may have a drainage tube in place for 2-3 days to prevent a collection of blood (hematoma) from developing in the breast area. You will be given instructions about caring for the drain before you go home.  A pressure bandage may be applied for 1-2 days to prevent bleeding or swelling. Ask your health care provider how to care for your pressure bandage at home. Summary  In a total mastectomy (simple mastectomy), all breast tissue including the nipple will be removed. In a modified radical mastectomy, the lymph nodes under the arm will be removed along with the breast and nipple.  Before the procedure, follow instructions from your health care provider about eating and drinking, and ask about changing or stopping your regular medicines.  You will be given a medicine to make you fall asleep (general anesthetic) during the procedure. This information is not intended to replace advice given to you by your health care provider. Make sure you discuss any questions you have with your health care provider. Document Revised: 07/07/2020 Document Reviewed: 07/07/2020 [employer] Patient Education  [employer].

## 2021-01-14 ENCOUNTER — Other Ambulatory Visit (HOSPITAL_COMMUNITY): Payer: Self-pay | Admitting: Hematology

## 2021-01-14 ENCOUNTER — Other Ambulatory Visit: Payer: Self-pay | Admitting: Family Medicine

## 2021-01-16 NOTE — H&P (Signed)
[facility] History and Physical   Chief Complaint    Follow-up      Elizabeth Graves is a 55 y.o. female.  HPI: Ms. Elizabeth Graves is a 55 yo who is known to me after having a triple negative breast cancer on the left that T2N1 who has undergone neoadjuvant therapy. Clinically no mass is noted and no adenopathy. She has not had a repeat MRI. She has completed treatment and has some resolving thrombocytopenia.  She says she is doing well and wants to proceed with surgery.  She is having no complaints.   Past Medical History:  Diagnosis Date  . Asthma   . Cancer ([place])    cervical - 20 yrs ago  . Carpal tunnel syndrome   . Depression   . Family history of breast cancer   . Family history of esophageal cancer   . Family history of lung cancer   . Family history of throat cancer   . Hypertension   . Hypokalemia    tx w/ k-Dur  . Port-A-Cath in place 07/24/2020  . Seasonal allergies    tx with benadryl prn  . SVD (spontaneous vaginal delivery)    x 2    Past Surgical History:  Procedure Laterality Date  . ABDOMINAL HYSTERECTOMY    . CO2 Laser of Cervix     for cervical cancer 20 yrs ago  . DILATION AND CURETTAGE OF UTERUS  12/2010   polyp removed  . DILATION AND CURETTAGE OF UTERUS     x 3 for MAB  . LAPAROSCOPIC ASSISTED VAGINAL HYSTERECTOMY  06/09/2012   Procedure: LAPAROSCOPIC ASSISTED VAGINAL HYSTERECTOMY;  Surgeon: Gus Height, MD;  Location: [facility];  Service: Gynecology;  Laterality: N/A;  . PORTACATH PLACEMENT Right 07/17/2020   Procedure: INSERTION PORT-A-CATH;  Surgeon: Virl Cagey, MD;  Location: [facility];  Service: General;  Laterality: Right;  . SALPINGOOPHORECTOMY  06/09/2012   Procedure: SALPINGO OOPHERECTOMY;  Surgeon: Gus Height, MD;  Location: [facility];  Service: Gynecology;  Laterality: Bilateral;  . TUBAL LIGATION      Family History  Problem Relation Age of Onset  . Heart disease Mother   . Lung cancer Mother 6       smoker  . Heart disease  Father   . Esophageal cancer Father        GE junction, dx. in his early 14s  . Diverticulitis Sister   . Diabetes Sister   . Diabetes Maternal Grandmother   . Heart disease Maternal Grandmother   . Throat cancer Maternal Grandmother        dx. late 18s  . Healthy Sister   . Healthy Daughter   . Healthy Son   . Breast cancer Paternal Aunt        limited info    Social History   Tobacco Use  . Smoking status: Former Smoker    Packs/day: 1.00    Years: 16.00    Pack years: 16.00    Types: Cigarettes    Start date: 05/30/1994    Quit date: 01/31/2020    Years since quitting: 0.9  . Smokeless tobacco: Never Used  Vaping Use  . Vaping Use: Never used  Substance Use Topics  . Alcohol use: No  . Drug use: No    Medications: I have reviewed the patient's current medications. Allergies as of 01/09/2021   No Known Allergies     Medication List       Accurate as of January 09, 2021  1:01 PM. If you have any questions, ask your nurse or doctor.        albuterol 108 (90 Base) MCG/ACT inhaler Commonly known as: VENTOLIN HFA TAKE 2 PUFFS BY MOUTH EVERY 6 HOURS AS NEEDED FOR WHEEZE OR SHORTNESS OF BREATH   atorvastatin 20 MG tablet Commonly known as: LIPITOR TAKE 1 TABLET BY MOUTH EVERY DAY   diphenhydrAMINE 25 mg capsule Commonly known as: BENADRYL Take 25 mg by mouth every 6 (six) hours as needed for allergies. Allergies.   esomeprazole 20 MG capsule Commonly known as: NEXIUM Take 20 mg by mouth daily before breakfast.   ibuprofen 200 MG tablet Commonly known as: ADVIL Take 400-600 mg by mouth every 8 (eight) hours as needed for moderate pain (pain.).   lidocaine-prilocaine cream Commonly known as: EMLA Apply a pea sized amount to port a cath site and cover with plastic wrap 1 hour prior to chemotherapy appointments   lisinopril 5 MG tablet Commonly known as: ZESTRIL TAKE 1 TABLET BY MOUTH EVERY DAY   naproxen sodium 220 MG tablet Commonly known as:  ALEVE Take 440 mg by mouth 3 (three) times daily as needed (pain.).   potassium chloride 10 MEQ tablet Commonly known as: KLOR-CON Take 1 tablet (10 mEq total) by mouth daily.   prochlorperazine 10 MG tablet Commonly known as: COMPAZINE Take 1 tablet (10 mg total) by mouth every 6 (six) hours as needed (Nausea or vomiting).   sertraline 50 MG tablet Commonly known as: ZOLOFT TAKE 1 TABLET BY MOUTH EVERY DAY        ROS:  A comprehensive review of systems was negative except for: Integument/breast: positive for left sided breast cancer  Blood pressure (!) 100/58, pulse 98, temperature (!) 97.3 F (36.3 C), temperature source Other (Comment), resp. rate 14, height 4\' 9"  (1.448 m), weight 161 lb (73 kg), SpO2 97 %. Physical Exam Vitals reviewed.  Constitutional:      Appearance: Normal appearance.  HENT:     Head: Normocephalic.     Nose: Nose normal.     Mouth/Throat:     Mouth: Mucous membranes are moist.  Eyes:     Extraocular Movements: Extraocular movements intact.  Cardiovascular:     Rate and Rhythm: Normal rate and regular rhythm.  Pulmonary:     Effort: Pulmonary effort is normal.     Breath sounds: Normal breath sounds.  Chest:     Comments: No mass in the left breast, nipple is no longer retracting, no apparent adenopathy on left side  Abdominal:     General: There is no distension.     Palpations: Abdomen is soft.     Tenderness: There is no abdominal tenderness.  Musculoskeletal:        General: Normal range of motion.     Cervical back: Normal range of motion.  Skin:    General: Skin is warm.  Neurological:     General: No focal deficit present.     Mental Status: She is alert and oriented to person, place, and time.  Psychiatric:        Mood and Affect: Mood normal.        Behavior: Behavior normal.        Thought Content: Thought content normal.        Judgment: Judgment normal.     Results: None  CLINICAL DATA:  54 year old female with  newly diagnosed left breast invasive ductal carcinoma and left axillary metastases.  LABS:  None performed on site.  EXAM: BILATERAL BREAST MRI WITH AND WITHOUT CONTRAST  TECHNIQUE: Multiplanar, multisequence MR images of both breasts were obtained prior to and following the intravenous administration of 8 ml of Gadavist.  Three-dimensional MR images were rendered by post-processing of the original MR data on an independent workstation. The three-dimensional MR images were interpreted, and findings are reported in the following complete MRI report for this study. Three dimensional images were evaluated at the independent DynaCad workstation.  COMPARISON:  Previous exam(s).  FINDINGS: Breast composition: c. Heterogeneous fibroglandular tissue.  Background parenchymal enhancement: Moderate.  Right breast: No suspicious mass or abnormal enhancement.  Left breast: Susceptibility artifact from post biopsy clip is seen in association with an irregular, enhancing mass in the subareolar left breast (series 6, image 56/112). This is consistent with the patient's biopsy-proven malignancy. The mass measures 4.2 x 3.5 x 3.5 cm. Enhancement from the mass extends to the level of the overlying left nipple which is retracted. There is mild skin thickening along the periareolar aspect. Otherwise, no suspicious mass or abnormal enhancement in the remainder of the left breast.  Lymph nodes: There are 6 morphologically abnormal level I left axillary lymph nodes, one of which demonstrates susceptibility artifact from post biopsy clip. No enlarged level II or III left axillary lymph nodes noted within the limits of this study. No suspicious internal mammary chain or right axillary lymph nodes.  Ancillary findings:  None.  IMPRESSION: 1. 4.2 cm enhancing mass in the subareolar left breast consistent with the patient's biopsy-proven site of malignancy. Enhancement extends to the  level of the left nipple with associated nipple retraction and periareolar skin thickening, suggesting involvement. 2. Six morphologically abnormal level I left axillary lymph nodes. 3. No MRI evidence of malignancy on the right.  RECOMMENDATION: Per clinical treatment plan.  BI-RADS CATEGORY  6: Known biopsy-proven malignancy.   Electronically Signed   By: Kristopher Oppenheim M.D.   On: 07/19/2020 09:43 Assessment & Plan:  SHERICKA SWAYZE is a 55 y.o. female with Triple negative breast cancer on the left s/p neoadjuvant therapy. Her pre treatment imaging with MRI demonstrated six abnormal appearing lymph nodes. I have discussed the option of MRI for planning our surgical options especially if she wants a breast conservation but I do think given the abnormal lymph nodes on the initial MRI that a full axillary dissection is likely needed.  Discussed that some people are doing sentinel nodes and removing the clipped nodes at the time of surgery, but this can result in a false negative finding if sufficient number of lymph nodes are not removed or the clipped node is not located, and this is especially concerning given her previous MRI with concern for multiple nodes.  She reports that she does not want to do radiation post surgery.   -Will proceed with modified radical mastectomy on the left.  We have discussed the risk of surgery including bleeding, risk of infection, and risk of lymph edema from the lymph node dissection. We have discussed the need for an overnight stay with a mastectomy and a drain that will remain in place for about 1 week.  Discussed that she could need additional treatment.   Discussed preop COVID testing.   All questions were answered to the satisfaction of the patient.   Virl Cagey 01/09/2021, 1:01 PM

## 2021-01-18 ENCOUNTER — Other Ambulatory Visit: Payer: Self-pay | Admitting: Family Medicine

## 2021-01-18 NOTE — Telephone Encounter (Signed)
Send my chart message to patient to schedule appointment

## 2021-01-18 NOTE — Telephone Encounter (Signed)
May have 90-day follow-up by spring

## 2021-01-18 NOTE — Telephone Encounter (Signed)
May have 6 months Needs follow-up office visit by March or April

## 2021-01-24 MED ORDER — SERTRALINE HCL 50 MG PO TABS: 50.0000 mg | ORAL_TABLET | Freq: Every day | ORAL | 0 refills | Status: DC

## 2021-01-24 NOTE — Telephone Encounter (Signed)
Patient has appointment 2/9/ for med check

## 2021-01-26 NOTE — Patient Instructions (Signed)
Elizabeth Graves  01/26/2021     @PREFPERIOPPHARMACY @   Your procedure is scheduled on  01/31/2021   Report to [facility] at  [facility].M.   Call this number if you have problems the morning of surgery:  [phone]     Remember:  Do not eat or drink after midnight.                       Take these medicines the morning of surgery with A SIP OF WATER               Nexium, loratadine, compazine(if needed), zoloft.             Use your inhaler before you come.   Please brush your teeth.  Do not wear jewelry, make-up or nail polish.  Do not wear lotions, powders, or perfumes, or deodorant.  Do not shave 48 hours prior to surgery.  Men may shave face and neck.  Do not bring valuables to the hospital.  [facility] is not responsible for any belongings or valuables.  Contacts, dentures or bridgework may not be worn into surgery.  Leave your suitcase in the car.  After surgery it may be brought to your room.  For patients admitted to the hospital, discharge time will be determined by your treatment team.  Patients discharged the day of surgery will not be allowed to drive home and must have someone with them for 24 hours.  Shower with CHG the night before and the morning of your procedure. DO NOT use CHG on your face, hair or genitals.  After your night shower, dry off with a clean towel, put on clean clothes to sleep in and place clean sheets of your bed to sleep on.  After your morning shower, dry off with a clean towel, put on clean comfortable clothing and brush your teeth before you come to the hospital.   Special instructions:  DO NOT smoke tobacco or vape the morning of your procedure.  Please read over the following fact sheets that you were given. Coughing and Deep Breathing, Surgical Site Infection Prevention, Anesthesia Post-op Instructions and Care and Recovery After Surgery       Total or Modified Radical Mastectomy, Care After This sheet gives you  information about how to care for yourself after your procedure. Your health care provider may also give you more specific instructions. If you have problems or questions, contact your health care provider. What can I expect after the procedure? After the procedure, it is common to have:  Pain.  Numbness.  Stiffness in the arm or shoulder.  Feelings of stress, sadness, or depression. If the lymph nodes under your arm were removed, you may have arm swelling, weakness, or numbness on the same side of your body as your surgery. Follow these instructions at home: Incision care  Follow instructions from your health care provider about how to take care of your incision. Make sure you: ? Wash your hands with soap and water before you change your bandage (dressing). If soap and water are not available, use hand sanitizer. ? Change your dressing as told by your health care provider. ? Leave stitches (sutures), skin glue, or adhesive strips in place. These skin closures may need to stay in place for 2 weeks or longer. If adhesive strip edges start to loosen and curl up, you may trim the loose edges. Do not remove adhesive  strips completely unless your health care provider tells you to do that.  Check your incision area every day for signs of infection. Check for: ? Redness, swelling, or more pain. ? Fluid or blood. ? Warmth. ? Pus or a bad smell.  If you were sent home with a surgical drain in place, follow instructions from your health care provider about emptying it.   Bathing  Do not take baths, swim, or use a hot tub until your health care provider approves. Ask your health care provider if you may take showers. You may only be allowed to take sponge baths. Activity  Return to your normal activities as told by your health care provider. Ask your health care provider what activities are safe for you.  Avoid activities that take a lot of effort.  Be careful to avoid any activities that  could cause an injury to your arm on the side of your surgery.  Do not lift anything that is heavier than 10 lb (4.5 kg), or the limit that you are told, until your health care provider says that it is safe.  Avoid lifting with the arm on the side of your surgery.  Do not carry heavy objects on your shoulder.  After your drain is removed, do exercises to prevent stiffness and swelling in your arm. Talk with your health care provider about which exercises are safe for you.   General instructions  Take over-the-counter and prescription medicines only as told by your health care provider.  You may eat what you usually do.  Keep your arm raised (elevated) above the level of your heart when you are sitting or lying down.  Do not wear tight jewelry on your arm, wrist, or fingers on the side of your surgery.  You may be given a tight sleeve (compression bandage) to wear over your arm on the side of your surgery. Wear this sleeve as told by your health care provider.  Ask your health care provider when you can start wearing a bra or using a breast prosthesis.  Before you are involved in certain procedures such as giving blood or having your blood pressure checked, tell all your health care providers if lymph nodes under your arm were removed. This is important information. Follow-up  Keep all follow-up visits as told by your health care provider. This is important.  Get checked for extra fluid around your lymph nodes (lymphedema) as often as told by your health care provider. Contact a health care provider if:  You have a fever.  Your pain medicine is not working.  Your arm swelling, weakness, or numbness has not improved after a few weeks.  You have new swelling in your breast area or arm.  You have redness, swelling, or more pain in your incision area.  You have fluid or blood coming from your incision.  Your incision feels warm to the touch.  You have pus or a bad smell coming  from your incision. Get help right away if:  You have very bad pain in your breast area or arm.  You have chest pain.  You have difficulty breathing. Summary  Follow instructions from your health care provider about how to take care of your incision. Check your incision area every day for signs of infection.  Ask your health care provider what activities are safe for you.  Keep all follow-up visits as told by your health care provider. This is important.  Make sure you know which symptoms should cause  you to contact your health care provider or to get help right away. This information is not intended to replace advice given to you by your health care provider. Make sure you discuss any questions you have with your health care provider. Document Revised: 07/07/2020 Document Reviewed: 07/07/2020 Elsevier Patient Education  [place] Anesthesia, Adult, Care After This sheet gives you information about how to care for yourself after your procedure. Your health care provider may also give you more specific instructions. If you have problems or questions, contact your health care provider. What can I expect after the procedure? After the procedure, the following side effects are common:  Pain or discomfort at the IV site.  Nausea.  Vomiting.  Sore throat.  Trouble concentrating.  Feeling cold or chills.  Feeling weak or tired.  Sleepiness and fatigue.  Soreness and body aches. These side effects can affect parts of the body that were not involved in surgery. Follow these instructions at home: For the time period you were told by your health care provider:  Rest.  Do not participate in activities where you could fall or become injured.  Do not drive or use machinery.  Do not drink alcohol.  Do not take sleeping pills or medicines that cause drowsiness.  Do not make important decisions or sign legal documents.  Do not take care of children on your  own.   Eating and drinking  Follow any instructions from your health care provider about eating or drinking restrictions.  When you feel hungry, start by eating small amounts of foods that are soft and easy to digest (bland), such as toast. Gradually return to your regular diet.  Drink enough fluid to keep your urine pale yellow.  If you vomit, rehydrate by drinking water, juice, or clear broth. General instructions  If you have sleep apnea, surgery and certain medicines can increase your risk for breathing problems. Follow instructions from your health care provider about wearing your sleep device: ? Anytime you are sleeping, including during daytime naps. ? While taking prescription pain medicines, sleeping medicines, or medicines that make you drowsy.  Have a responsible adult stay with you for the time you are told. It is important to have someone help care for you until you are awake and alert.  Return to your normal activities as told by your health care provider. Ask your health care provider what activities are safe for you.  Take over-the-counter and prescription medicines only as told by your health care provider.  If you smoke, do not smoke without supervision.  Keep all follow-up visits as told by your health care provider. This is important. Contact a health care provider if:  You have nausea or vomiting that does not get better with medicine.  You cannot eat or drink without vomiting.  You have pain that does not get better with medicine.  You are unable to pass urine.  You develop a skin rash.  You have a fever.  You have redness around your IV site that gets worse. Get help right away if:  You have difficulty breathing.  You have chest pain.  You have blood in your urine or stool, or you vomit blood. Summary  After the procedure, it is common to have a sore throat or nausea. It is also common to feel tired.  Have a responsible adult stay with you for  the time you are told. It is important to have someone help care for you until you  are awake and alert.  When you feel hungry, start by eating small amounts of foods that are soft and easy to digest (bland), such as toast. Gradually return to your regular diet.  Drink enough fluid to keep your urine pale yellow.  Return to your normal activities as told by your health care provider. Ask your health care provider what activities are safe for you. This information is not intended to replace advice given to you by your health care provider. Make sure you discuss any questions you have with your health care provider. Document Revised: 08/31/2020 Document Reviewed: 03/30/2020 Elsevier Patient Education  2021 [employer].

## 2021-01-29 ENCOUNTER — Encounter (HOSPITAL_COMMUNITY): Payer: Self-pay

## 2021-01-29 ENCOUNTER — Other Ambulatory Visit (HOSPITAL_COMMUNITY)
Admission: RE | Admit: 2021-01-29 | Discharge: 2021-01-29 | Disposition: A | Discharge status: Home / Self Care | Payer: 59 | Source: Ambulatory Visit | Attending: General Surgery | Admitting: General Surgery

## 2021-01-29 ENCOUNTER — Encounter (HOSPITAL_COMMUNITY)
Admission: RE | Admit: 2021-01-29 | Discharge: 2021-01-29 | Disposition: A | Discharge status: Home / Self Care | Payer: 59 | Source: Ambulatory Visit | Attending: General Surgery | Admitting: General Surgery

## 2021-01-29 ENCOUNTER — Other Ambulatory Visit: Payer: Self-pay

## 2021-01-29 DIAGNOSIS — Z01812 Encounter for preprocedural laboratory examination: Secondary | ICD-10-CM | POA: Diagnosis present

## 2021-01-29 HISTORY — DX: Malignant neoplasm of unspecified site of unspecified female breast: C50.919

## 2021-01-29 LAB — BASIC METABOLIC PANEL
Anion gap: 9 (ref 5–15)
BUN: 13 mg/dL (ref 6–20)
CO2: 23 mmol/L (ref 22–32)
Calcium: 9.4 mg/dL (ref 8.9–10.3)
Chloride: 102 mmol/L (ref 98–111)
Creatinine, Ser: 0.88 mg/dL (ref 0.44–1.00)
GFR, Estimated: >60 mL/min (ref >60)
Glucose, Bld: 159 mg/dL — ABNORMAL HIGH (ref 70–99)
Potassium: 3.7 mmol/L (ref 3.5–5.1)
Sodium: 134 mmol/L — ABNORMAL LOW (ref 135–145)

## 2021-01-29 LAB — CBC WITH DIFFERENTIAL/PLATELET
Abs Immature Granulocytes: 0.04 10*3/uL (ref 0.00–0.07)
Basophils Absolute: 0 10*3/uL (ref 0.0–0.1)
Basophils Relative: 1 %
Eosinophils Absolute: 0.2 10*3/uL (ref 0.0–0.5)
Eosinophils Relative: 3 %
HCT: 33.6 % — ABNORMAL LOW (ref 36.0–46.0)
Hemoglobin: 11.1 g/dL — ABNORMAL LOW (ref 12.0–15.0)
Immature Granulocytes: 1 %
Lymphocytes Relative: 28 %
Lymphs Abs: 1.7 10*3/uL (ref 0.7–4.0)
MCH: 32.8 pg (ref 26.0–34.0)
MCHC: 33 g/dL (ref 30.0–36.0)
MCV: 99.4 fL (ref 80.0–100.0)
Monocytes Absolute: 0.6 10*3/uL (ref 0.1–1.0)
Monocytes Relative: 11 %
Neutro Abs: 3.4 10*3/uL (ref 1.7–7.7)
Neutrophils Relative %: 56 %
Platelets: 287 10*3/uL (ref 150–400)
RBC: 3.38 MIL/uL — ABNORMAL LOW (ref 3.87–5.11)
RDW: 15.3 % (ref 11.5–15.5)
WBC: 6 10*3/uL (ref 4.0–10.5)
nRBC: 0 % (ref 0.0–0.2)

## 2021-01-29 LAB — TYPE AND SCREEN
ABO/RH(D): A POS
Antibody Screen: NEGATIVE

## 2021-01-31 DIAGNOSIS — C50919 Malignant neoplasm of unspecified site of unspecified female breast: Secondary | ICD-10-CM | POA: Diagnosis present

## 2021-02-05 NOTE — Patient Instructions (Signed)
Your procedure is scheduled on: 02/09/2021  Report to [facility] at  7:30   AM.  Call this number if you have problems the morning of surgery: [phone]   Remember:   Do not Eat or Drink after midnight         No Smoking the morning of surgery  :  Take these medicines the morning of surgery with A SIP OF WATER: Nexium and claritin  Compazine and inhalers if needed   Do not wear jewelry, make-up or nail polish.  Do not wear lotions, powders, or perfumes. You may wear deodorant.  Do not shave 48 hours prior to surgery. Men may shave face and neck.  Do not bring valuables to the hospital.  Contacts, dentures or bridgework may not be worn into surgery.  Leave suitcase in the car. After surgery it may be brought to your room.  For patients admitted to the hospital, checkout time is 11:00 AM the day of discharge.   Patients discharged the day of surgery will not be allowed to drive home.    Special Instructions: Shower using CHG night before surgery and shower the day of surgery use CHG.  Use special wash - you have one bottle of CHG for all showers.  You should use approximately 1/2 of the bottle for each shower.  How to Use Chlorhexidine for Bathing Chlorhexidine gluconate (CHG) is a germ-killing (antiseptic) solution that is used to clean the skin. It can get rid of the bacteria that normally live on the skin and can keep them away for about 24 hours. To clean your skin with CHG, you may be given:  A CHG solution to use in the shower or as part of a sponge bath.  A prepackaged cloth that contains CHG. Cleaning your skin with CHG may help lower the risk for infection:  While you are staying in the intensive care unit of the hospital.  If you have a vascular access, such as a central line, to provide short-term or long-term access to your veins.  If you have a catheter to drain urine from your bladder.  If you are on a ventilator. A ventilator is a machine that helps you breathe  by moving air in and out of your lungs.  After surgery. What are the risks? Risks of using CHG include:  A skin reaction.  Hearing loss, if CHG gets in your ears.  Eye injury, if CHG gets in your eyes and is not rinsed out.  The CHG product catching fire. Make sure that you avoid smoking and flames after applying CHG to your skin. Do not use CHG:  If you have a chlorhexidine allergy or have previously reacted to chlorhexidine.  On babies younger than 8 months of age. How to use CHG solution  Use CHG only as told by your health care provider, and follow the instructions on the label.  Use the full amount of CHG as directed. Usually, this is one bottle. During a shower Follow these steps when using CHG solution during a shower (unless your health care provider gives you different instructions): 1. Start the shower. 2. Use your normal soap and shampoo to wash your face and hair. 3. Turn off the shower or move out of the shower stream. 4. Pour the CHG onto a clean washcloth. Do not use any type of brush or rough-edged sponge. 5. Starting at your neck, lather your body down to your toes. Make sure you follow these instructions: ? If  you will be having surgery, pay special attention to the part of your body where you will be having surgery. Scrub this area for at least 1 minute. ? Do not use CHG on your head or face. If the solution gets into your ears or eyes, rinse them well with water. ? Avoid your genital area. ? Avoid any areas of skin that have broken skin, cuts, or scrapes. ? Scrub your back and under your arms. Make sure to wash skin folds. 6. Let the lather sit on your skin for 1-2 minutes or as long as told by your health care provider. 7. Thoroughly rinse your entire body in the shower. Make sure that all body creases and crevices are rinsed well. 8. Dry off with a clean towel. Do not put any substances on your body afterward-such as powder, lotion, or perfume-unless you  are told to do so by your health care provider. Only use lotions that are recommended by the manufacturer. 9. Put on clean clothes or pajamas. 10. If it is the night before your surgery, sleep in clean sheets.   During a sponge bath Follow these steps when using CHG solution during a sponge bath (unless your health care provider gives you different instructions): 1. Use your normal soap and shampoo to wash your face and hair. 2. Pour the CHG onto a clean washcloth. 3. Starting at your neck, lather your body down to your toes. Make sure you follow these instructions: ? If you will be having surgery, pay special attention to the part of your body where you will be having surgery. Scrub this area for at least 1 minute. ? Do not use CHG on your head or face. If the solution gets into your ears or eyes, rinse them well with water. ? Avoid your genital area. ? Avoid any areas of skin that have broken skin, cuts, or scrapes. ? Scrub your back and under your arms. Make sure to wash skin folds. 4. Let the lather sit on your skin for 1-2 minutes or as long as told by your health care provider. 5. Using a different clean, wet washcloth, thoroughly rinse your entire body. Make sure that all body creases and crevices are rinsed well. 6. Dry off with a clean towel. Do not put any substances on your body afterward-such as powder, lotion, or perfume-unless you are told to do so by your health care provider. Only use lotions that are recommended by the manufacturer. 7. Put on clean clothes or pajamas. 8. If it is the night before your surgery, sleep in clean sheets. How to use CHG prepackaged cloths  Only use CHG cloths as told by your health care provider, and follow the instructions on the label.  Use the CHG cloth on clean, dry skin.  Do not use the CHG cloth on your head or face unless your health care provider tells you to.  When washing with the CHG cloth: ? Avoid your genital area. ? Avoid any  areas of skin that have broken skin, cuts, or scrapes. Before surgery Follow these steps when using a CHG cloth to clean before surgery (unless your health care provider gives you different instructions): 1. Using the CHG cloth, vigorously scrub the part of your body where you will be having surgery. Scrub using a back-and-forth motion for 3 minutes. The area on your body should be completely wet with CHG when you are done scrubbing. 2. Do not rinse. Discard the cloth and let the area air-dry.  Do not put any substances on the area afterward, such as powder, lotion, or perfume. 3. Put on clean clothes or pajamas. 4. If it is the night before your surgery, sleep in clean sheets.   For general bathing Follow these steps when using CHG cloths for general bathing (unless your health care provider gives you different instructions). 1. Use a separate CHG cloth for each area of your body. Make sure you wash between any folds of skin and between your fingers and toes. Wash your body in the following order, switching to a new cloth after each step: ? The front of your neck, shoulders, and chest. ? Both of your arms, under your arms, and your hands. ? Your stomach and groin area, avoiding the genitals. ? Your right leg and foot. ? Your left leg and foot. ? The back of your neck, your back, and your buttocks. 2. Do not rinse. Discard the cloth and let the area air-dry. Do not put any substances on your body afterward-such as powder, lotion, or perfume-unless you are told to do so by your health care provider. Only use lotions that are recommended by the manufacturer. 3. Put on clean clothes or pajamas. Contact a health care provider if:  Your skin gets irritated after scrubbing.  You have questions about using your solution or cloth. Get help right away if:  Your eyes become very red or swollen.  Your eyes itch badly.  Your skin itches badly and is red or swollen.  Your hearing changes.  You  have trouble seeing.  You have swelling or tingling in your mouth or throat.  You have trouble breathing.  You swallow any chlorhexidine. Summary  Chlorhexidine gluconate (CHG) is a germ-killing (antiseptic) solution that is used to clean the skin. Cleaning your skin with CHG may help to lower your risk for infection.  You may be given CHG to use for bathing. It may be in a bottle or in a prepackaged cloth to use on your skin. Carefully follow your health care provider's instructions and the instructions on the product label.  Do not use CHG if you have a chlorhexidine allergy.  Contact your health care provider if your skin gets irritated after scrubbing. This information is not intended to replace advice given to you by your health care provider. Make sure you discuss any questions you have with your health care provider. Document Revised: 06/02/2020 Document Reviewed: 06/02/2020 Elsevier Patient Education  2021 [employer].  Total or Modified Radical Mastectomy, Care After This sheet gives you information about how to care for yourself after your procedure. Your health care provider may also give you more specific instructions. If you have problems or questions, contact your health care provider. What can I expect after the procedure? After the procedure, it is common to have:  Pain.  Numbness.  Stiffness in the arm or shoulder.  Feelings of stress, sadness, or depression. If the lymph nodes under your arm were removed, you may have arm swelling, weakness, or numbness on the same side of your body as your surgery. Follow these instructions at home: Incision care  Follow instructions from your health care provider about how to take care of your incision. Make sure you: ? Wash your hands with soap and water before you change your bandage (dressing). If soap and water are not available, use hand sanitizer. ? Change your dressing as told by your health care provider. ? Leave  stitches (sutures), skin glue, or adhesive strips in  place. These skin closures may need to stay in place for 2 weeks or longer. If adhesive strip edges start to loosen and curl up, you may trim the loose edges. Do not remove adhesive strips completely unless your health care provider tells you to do that.  Check your incision area every day for signs of infection. Check for: ? Redness, swelling, or more pain. ? Fluid or blood. ? Warmth. ? Pus or a bad smell.  If you were sent home with a surgical drain in place, follow instructions from your health care provider about emptying it.   Bathing  Do not take baths, swim, or use a hot tub until your health care provider approves. Ask your health care provider if you may take showers. You may only be allowed to take sponge baths. Activity  Return to your normal activities as told by your health care provider. Ask your health care provider what activities are safe for you.  Avoid activities that take a lot of effort.  Be careful to avoid any activities that could cause an injury to your arm on the side of your surgery.  Do not lift anything that is heavier than 10 lb (4.5 kg), or the limit that you are told, until your health care provider says that it is safe.  Avoid lifting with the arm on the side of your surgery.  Do not carry heavy objects on your shoulder.  After your drain is removed, do exercises to prevent stiffness and swelling in your arm. Talk with your health care provider about which exercises are safe for you.   General instructions  Take over-the-counter and prescription medicines only as told by your health care provider.  You may eat what you usually do.  Keep your arm raised (elevated) above the level of your heart when you are sitting or lying down.  Do not wear tight jewelry on your arm, wrist, or fingers on the side of your surgery.  You may be given a tight sleeve (compression bandage) to wear over your arm on the  side of your surgery. Wear this sleeve as told by your health care provider.  Ask your health care provider when you can start wearing a bra or using a breast prosthesis.  Before you are involved in certain procedures such as giving blood or having your blood pressure checked, tell all your health care providers if lymph nodes under your arm were removed. This is important information. Follow-up  Keep all follow-up visits as told by your health care provider. This is important.  Get checked for extra fluid around your lymph nodes (lymphedema) as often as told by your health care provider. Contact a health care provider if:  You have a fever.  Your pain medicine is not working.  Your arm swelling, weakness, or numbness has not improved after a few weeks.  You have new swelling in your breast area or arm.  You have redness, swelling, or more pain in your incision area.  You have fluid or blood coming from your incision.  Your incision feels warm to the touch.  You have pus or a bad smell coming from your incision. Get help right away if:  You have very bad pain in your breast area or arm.  You have chest pain.  You have difficulty breathing. Summary  Follow instructions from your health care provider about how to take care of your incision. Check your incision area every day for signs of infection.  Ask  your health care provider what activities are safe for you.  Keep all follow-up visits as told by your health care provider. This is important.  Make sure you know which symptoms should cause you to contact your health care provider or to get help right away. This information is not intended to replace advice given to you by your health care provider. Make sure you discuss any questions you have with your health care provider. Document Revised: 07/07/2020 Document Reviewed: 07/07/2020 Elsevier Patient Education  2021 [employer]  General Anesthesia, Adult, Care  After This sheet gives you information about how to care for yourself after your procedure. Your health care provider may also give you more specific instructions. If you have problems or questions, contact your health care provider. What can I expect after the procedure? After the procedure, the following side effects are common:  Pain or discomfort at the IV site.  Nausea.  Vomiting.  Sore throat.  Trouble concentrating.  Feeling cold or chills.  Feeling weak or tired.  Sleepiness and fatigue.  Soreness and body aches. These side effects can affect parts of the body that were not involved in surgery. Follow these instructions at home: For the time period you were told by your health care provider:  Rest.  Do not participate in activities where you could fall or become injured.  Do not drive or use machinery.  Do not drink alcohol.  Do not take sleeping pills or medicines that cause drowsiness.  Do not make important decisions or sign legal documents.  Do not take care of children on your own.   Eating and drinking  Follow any instructions from your health care provider about eating or drinking restrictions.  When you feel hungry, start by eating small amounts of foods that are soft and easy to digest (bland), such as toast. Gradually return to your regular diet.  Drink enough fluid to keep your urine pale yellow.  If you vomit, rehydrate by drinking water, juice, or clear broth. General instructions  If you have sleep apnea, surgery and certain medicines can increase your risk for breathing problems. Follow instructions from your health care provider about wearing your sleep device: ? Anytime you are sleeping, including during daytime naps. ? While taking prescription pain medicines, sleeping medicines, or medicines that make you drowsy.  Have a responsible adult stay with you for the time you are told. It is important to have someone help care for you until you  are awake and alert.  Return to your normal activities as told by your health care provider. Ask your health care provider what activities are safe for you.  Take over-the-counter and prescription medicines only as told by your health care provider.  If you smoke, do not smoke without supervision.  Keep all follow-up visits as told by your health care provider. This is important. Contact a health care provider if:  You have nausea or vomiting that does not get better with medicine.  You cannot eat or drink without vomiting.  You have pain that does not get better with medicine.  You are unable to pass urine.  You develop a skin rash.  You have a fever.  You have redness around your IV site that gets worse. Get help right away if:  You have difficulty breathing.  You have chest pain.  You have blood in your urine or stool, or you vomit blood. Summary  After the procedure, it is common to have a sore throat  or nausea. It is also common to feel tired.  Have a responsible adult stay with you for the time you are told. It is important to have someone help care for you until you are awake and alert.  When you feel hungry, start by eating small amounts of foods that are soft and easy to digest (bland), such as toast. Gradually return to your regular diet.  Drink enough fluid to keep your urine pale yellow.  Return to your normal activities as told by your health care provider. Ask your health care provider what activities are safe for you. This information is not intended to replace advice given to you by your health care provider. Make sure you discuss any questions you have with your health care provider. Document Revised: 08/31/2020 Document Reviewed: 03/30/2020 Elsevier Patient Education  2021 [employer].

## 2021-02-06 ENCOUNTER — Encounter (HOSPITAL_COMMUNITY)
Admission: RE | Admit: 2021-02-06 | Discharge: 2021-02-06 | Disposition: A | Discharge status: Home / Self Care | Payer: 59 | Source: Ambulatory Visit | Attending: General Surgery | Admitting: General Surgery

## 2021-02-06 ENCOUNTER — Other Ambulatory Visit: Payer: Self-pay

## 2021-02-07 ENCOUNTER — Other Ambulatory Visit: Payer: Self-pay

## 2021-02-07 ENCOUNTER — Telehealth (INDEPENDENT_AMBULATORY_CARE_PROVIDER_SITE_OTHER): Payer: 59 | Admitting: Family Medicine

## 2021-02-07 ENCOUNTER — Other Ambulatory Visit (HOSPITAL_COMMUNITY)
Admission: RE | Admit: 2021-02-07 | Discharge: 2021-02-07 | Disposition: A | Discharge status: Home / Self Care | Payer: 59 | Source: Ambulatory Visit | Attending: General Surgery | Admitting: General Surgery

## 2021-02-07 DIAGNOSIS — Z01812 Encounter for preprocedural laboratory examination: Secondary | ICD-10-CM | POA: Insufficient documentation

## 2021-02-07 DIAGNOSIS — R739 Hyperglycemia, unspecified: Secondary | ICD-10-CM | POA: Diagnosis not present

## 2021-02-07 DIAGNOSIS — Z20822 Contact with and (suspected) exposure to covid-19: Secondary | ICD-10-CM | POA: Diagnosis not present

## 2021-02-07 DIAGNOSIS — I10 Essential (primary) hypertension: Secondary | ICD-10-CM

## 2021-02-07 DIAGNOSIS — R7303 Prediabetes: Secondary | ICD-10-CM | POA: Diagnosis not present

## 2021-02-07 DIAGNOSIS — E785 Hyperlipidemia, unspecified: Secondary | ICD-10-CM | POA: Diagnosis not present

## 2021-02-07 DIAGNOSIS — F419 Anxiety disorder, unspecified: Secondary | ICD-10-CM

## 2021-02-07 LAB — TYPE AND SCREEN
ABO/RH(D): A POS
Antibody Screen: NEGATIVE

## 2021-02-07 MED ORDER — ATORVASTATIN CALCIUM 20 MG PO TABS: 20.0000 mg | ORAL_TABLET | Freq: Every day | ORAL | 1 refills | Status: DC

## 2021-02-07 MED ORDER — LISINOPRIL 5 MG PO TABS: 5.0000 mg | ORAL_TABLET | Freq: Every day | ORAL | 1 refills | Status: DC

## 2021-02-07 MED ORDER — SERTRALINE HCL 50 MG PO TABS: 50.0000 mg | ORAL_TABLET | Freq: Every day | ORAL | 1 refills | Status: DC

## 2021-02-07 NOTE — Progress Notes (Signed)
   Subjective:    Patient ID: Elizabeth Graves, female    DOB: 08-Aug-1966, 55 y.o.   MRN: [id number]  HPImed check up. Pt states she is doing well and no concerns or problems.  Patient has gone through quite a bit of difficulties with her breast cancer diagnosis she is undergoing chemotherapy now she has surgery coming up in 2 days she denies any major setbacks otherwise denies any chest tightness pressure pain shortness of breath states her moods are doing okay given everything she is gone through  Having breast surgery in 2 days and going for covid test today at 2:30.   Virtual Visit via Telephone Note  I connected with Elizabeth Graves on 02/07/21 at 11:00 AM EST by telephone and verified that I am speaking with the correct person using two identifiers.  Location: Patient: home Provider: office   I discussed the limitations, risks, security and privacy concerns of performing an evaluation and management service by telephone and the availability of in person appointments. I also discussed with the patient that there may be a patient responsible charge related to this service. The patient expressed understanding and agreed to proceed.   History of Present Illness:    Observations/Objective:   Assessment and Plan:   Follow Up Instructions:    I discussed the assessment and treatment plan with the patient. The patient was provided an opportunity to ask questions and all were answered. The patient agreed with the plan and demonstrated an understanding of the instructions.   The patient was advised to call back or seek an in-person evaluation if the symptoms worsen or if the condition fails to improve as anticipated.  I provided 20  minutes of non-face-to-face time during this encounter.       Review of Systems See above    Objective:   Physical Exam  Today's visit was via telephone Physical exam was not possible for this visit       Assessment & Plan:  1.  Hyperglycemia Patient would benefit from checking A1c she will do this after she gets over her surgery we will follow-up with her regarding the results  2. Prediabetes Given hyperglycemia will check A1c patient will not be able to do this before her surgery but will do so after she recovers she will minimize starches in her diet as best as possible - Hemoglobin A1c  3. HTN (hypertension), benign Blood pressure good control continue current measures - Basic metabolic panel  4. Hyperlipidemia, unspecified hyperlipidemia type Healthy diet check lipid profile - Lipid panel  She is handling the stress of all this fairly well currently on Zoloft she will continue this follow-up again my late spring  5. Mild anxiety Zoloft as per above

## 2021-02-08 LAB — SARS CORONAVIRUS 2 (TAT 6-24 HRS): SARS Coronavirus 2: NEGATIVE

## 2021-02-09 ENCOUNTER — Ambulatory Visit (HOSPITAL_COMMUNITY): Payer: 59 | Admitting: Certified Registered Nurse Anesthetist

## 2021-02-09 ENCOUNTER — Other Ambulatory Visit: Payer: Self-pay

## 2021-02-09 ENCOUNTER — Encounter (HOSPITAL_COMMUNITY)
Admission: RE | Disposition: A | Discharge status: Home / Self Care | Payer: Self-pay | Source: Home / Self Care | Attending: General Surgery

## 2021-02-09 ENCOUNTER — Observation Stay (HOSPITAL_COMMUNITY)
Admission: RE | Admit: 2021-02-09 | Discharge: 2021-02-10 | Disposition: A | Discharge status: Home / Self Care | Payer: 59 | Attending: General Surgery | Admitting: General Surgery

## 2021-02-09 ENCOUNTER — Encounter (HOSPITAL_COMMUNITY): Payer: Self-pay | Admitting: General Surgery

## 2021-02-09 DIAGNOSIS — C50919 Malignant neoplasm of unspecified site of unspecified female breast: Secondary | ICD-10-CM | POA: Diagnosis present

## 2021-02-09 DIAGNOSIS — C50912 Malignant neoplasm of unspecified site of left female breast: Secondary | ICD-10-CM | POA: Diagnosis present

## 2021-02-09 DIAGNOSIS — Z79899 Other long term (current) drug therapy: Secondary | ICD-10-CM | POA: Diagnosis not present

## 2021-02-09 DIAGNOSIS — J45909 Unspecified asthma, uncomplicated: Secondary | ICD-10-CM | POA: Diagnosis not present

## 2021-02-09 DIAGNOSIS — Z8541 Personal history of malignant neoplasm of cervix uteri: Secondary | ICD-10-CM | POA: Diagnosis not present

## 2021-02-09 DIAGNOSIS — I10 Essential (primary) hypertension: Secondary | ICD-10-CM | POA: Insufficient documentation

## 2021-02-09 DIAGNOSIS — Z87891 Personal history of nicotine dependence: Secondary | ICD-10-CM | POA: Diagnosis not present

## 2021-02-09 HISTORY — PX: MASTECTOMY MODIFIED RADICAL: SHX5962

## 2021-02-09 SURGERY — MASTECTOMY, MODIFIED RADICAL
Anesthesia: General | Site: Breast | Laterality: Left

## 2021-02-09 MED ORDER — LISINOPRIL 5 MG PO TABS
5.0000 mg | ORAL_TABLET | Freq: Every day | ORAL | Status: DC
  Administered 2021-02-10: 5 mg via ORAL
  Filled 2021-02-09: qty 1

## 2021-02-09 MED ORDER — MIDAZOLAM HCL 2 MG/2ML IJ SOLN
INTRAMUSCULAR | Status: DC | PRN
  Administered 2021-02-09: 2 mg via INTRAVENOUS

## 2021-02-09 MED ORDER — ONDANSETRON HCL 4 MG/2ML IJ SOLN
INTRAMUSCULAR | Status: AC
  Filled 2021-02-09: qty 2

## 2021-02-09 MED ORDER — MORPHINE SULFATE (PF) 2 MG/ML IV SOLN: 2.0000 mg | INTRAVENOUS | Status: DC | PRN

## 2021-02-09 MED ORDER — CEFAZOLIN SODIUM-DEXTROSE 2-4 GM/100ML-% IV SOLN
INTRAVENOUS | Status: AC
  Filled 2021-02-09: qty 100

## 2021-02-09 MED ORDER — BUPIVACAINE LIPOSOME 1.3 % IJ SUSP
INTRAMUSCULAR | Status: AC
  Filled 2021-02-09: qty 20

## 2021-02-09 MED ORDER — PANTOPRAZOLE SODIUM 40 MG PO TBEC
40.0000 mg | DELAYED_RELEASE_TABLET | Freq: Every day | ORAL | Status: DC
  Administered 2021-02-10: 40 mg via ORAL
  Filled 2021-02-09: qty 1

## 2021-02-09 MED ORDER — LORATADINE 10 MG PO TABS
10.0000 mg | ORAL_TABLET | Freq: Every day | ORAL | Status: DC
  Administered 2021-02-10: 10 mg via ORAL
  Filled 2021-02-09: qty 1

## 2021-02-09 MED ORDER — CHLORHEXIDINE GLUCONATE 0.12 % MT SOLN
15.0000 mL | Freq: Once | OROMUCOSAL | Status: AC
  Administered 2021-02-09: 15 mL via OROMUCOSAL

## 2021-02-09 MED ORDER — CEFAZOLIN SODIUM-DEXTROSE 2-4 GM/100ML-% IV SOLN
2.0000 g | INTRAVENOUS | Status: AC
  Administered 2021-02-09: 2 g via INTRAVENOUS

## 2021-02-09 MED ORDER — DOCUSATE SODIUM 100 MG PO CAPS
100.0000 mg | ORAL_CAPSULE | Freq: Two times a day (BID) | ORAL | Status: DC
  Administered 2021-02-09 – 2021-02-10 (×3): 100 mg via ORAL
  Filled 2021-02-09 (×3): qty 1

## 2021-02-09 MED ORDER — ACETAMINOPHEN 500 MG PO TABS
1000.0000 mg | ORAL_TABLET | Freq: Four times a day (QID) | ORAL | Status: DC
  Administered 2021-02-09 – 2021-02-10 (×3): 1000 mg via ORAL
  Filled 2021-02-09 (×4): qty 2

## 2021-02-09 MED ORDER — DIPHENHYDRAMINE HCL 50 MG/ML IJ SOLN
12.5000 mg | Freq: Four times a day (QID) | INTRAMUSCULAR | Status: DC | PRN
Start: 2021-02-09 — End: 2021-02-10

## 2021-02-09 MED ORDER — DEXAMETHASONE SODIUM PHOSPHATE 10 MG/ML IJ SOLN
INTRAMUSCULAR | Status: DC | PRN
  Administered 2021-02-09: 5 mg via INTRAVENOUS

## 2021-02-09 MED ORDER — SUGAMMADEX SODIUM 500 MG/5ML IV SOLN
INTRAVENOUS | Status: DC | PRN
  Administered 2021-02-09: 200 mg via INTRAVENOUS

## 2021-02-09 MED ORDER — SIMETHICONE 80 MG PO CHEW: 40.0000 mg | CHEWABLE_TABLET | Freq: Four times a day (QID) | ORAL | Status: DC | PRN

## 2021-02-09 MED ORDER — LACTATED RINGERS IV SOLN
INTRAVENOUS | Status: DC
  Administered 2021-02-09: 1000 mL via INTRAVENOUS

## 2021-02-09 MED ORDER — IBUPROFEN 600 MG PO TABS: 600.0000 mg | ORAL_TABLET | Freq: Four times a day (QID) | ORAL | Status: DC | PRN

## 2021-02-09 MED ORDER — LIDOCAINE HCL (CARDIAC) PF 100 MG/5ML IV SOSY
PREFILLED_SYRINGE | INTRAVENOUS | Status: DC | PRN
  Administered 2021-02-09: 80 mg via INTRATRACHEAL

## 2021-02-09 MED ORDER — ONDANSETRON HCL 4 MG/2ML IJ SOLN
INTRAMUSCULAR | Status: DC | PRN
  Administered 2021-02-09: 4 mg via INTRAVENOUS

## 2021-02-09 MED ORDER — ALBUTEROL SULFATE HFA 108 (90 BASE) MCG/ACT IN AERS: 2.0000 | INHALATION_SPRAY | Freq: Four times a day (QID) | RESPIRATORY_TRACT | Status: DC | PRN

## 2021-02-09 MED ORDER — FENTANYL CITRATE (PF) 250 MCG/5ML IJ SOLN
INTRAMUSCULAR | Status: AC
  Filled 2021-02-09: qty 5

## 2021-02-09 MED ORDER — ONDANSETRON HCL 4 MG/2ML IJ SOLN: 4.0000 mg | Freq: Once | INTRAMUSCULAR | Status: DC | PRN

## 2021-02-09 MED ORDER — MIDAZOLAM HCL 2 MG/2ML IJ SOLN
INTRAMUSCULAR | Status: AC
  Filled 2021-02-09: qty 2

## 2021-02-09 MED ORDER — ROCURONIUM BROMIDE 10 MG/ML (PF) SYRINGE
PREFILLED_SYRINGE | INTRAVENOUS | Status: DC | PRN
  Administered 2021-02-09: 50 mg via INTRAVENOUS

## 2021-02-09 MED ORDER — ONDANSETRON HCL 4 MG/2ML IJ SOLN: 4.0000 mg | Freq: Four times a day (QID) | INTRAMUSCULAR | Status: DC | PRN

## 2021-02-09 MED ORDER — KETOROLAC TROMETHAMINE 30 MG/ML IJ SOLN
30.0000 mg | Freq: Once | INTRAMUSCULAR | Status: AC
  Administered 2021-02-09: 30 mg via INTRAVENOUS

## 2021-02-09 MED ORDER — HEPARIN SODIUM (PORCINE) 5000 UNIT/ML IJ SOLN
5000.0000 [IU] | Freq: Three times a day (TID) | INTRAMUSCULAR | Status: DC
  Administered 2021-02-09 – 2021-02-10 (×3): 5000 [IU] via SUBCUTANEOUS
  Filled 2021-02-09 (×3): qty 1

## 2021-02-09 MED ORDER — CHLORHEXIDINE GLUCONATE CLOTH 2 % EX PADS: 6.0000 | MEDICATED_PAD | Freq: Once | CUTANEOUS | Status: DC

## 2021-02-09 MED ORDER — 0.9 % SODIUM CHLORIDE (POUR BTL) OPTIME
TOPICAL | Status: DC | PRN
  Administered 2021-02-09: 1000 mL

## 2021-02-09 MED ORDER — FENTANYL CITRATE (PF) 100 MCG/2ML IJ SOLN
INTRAMUSCULAR | Status: DC | PRN
  Administered 2021-02-09: 50 ug via INTRAVENOUS
  Administered 2021-02-09: 100 ug via INTRAVENOUS
  Administered 2021-02-09 (×4): 50 ug via INTRAVENOUS

## 2021-02-09 MED ORDER — ONDANSETRON 4 MG PO TBDP: 4.0000 mg | ORAL_TABLET | Freq: Four times a day (QID) | ORAL | Status: DC | PRN

## 2021-02-09 MED ORDER — BUPIVACAINE LIPOSOME 1.3 % IJ SUSP
INTRAMUSCULAR | Status: DC | PRN
  Administered 2021-02-09: 20 mL

## 2021-02-09 MED ORDER — SERTRALINE HCL 50 MG PO TABS
50.0000 mg | ORAL_TABLET | Freq: Every day | ORAL | Status: DC
  Administered 2021-02-09 – 2021-02-10 (×2): 50 mg via ORAL
  Filled 2021-02-09 (×2): qty 1

## 2021-02-09 MED ORDER — DEXAMETHASONE SODIUM PHOSPHATE 10 MG/ML IJ SOLN
INTRAMUSCULAR | Status: AC
  Filled 2021-02-09: qty 1

## 2021-02-09 MED ORDER — FENTANYL CITRATE (PF) 100 MCG/2ML IJ SOLN
25.0000 ug | INTRAMUSCULAR | Status: DC | PRN
Start: 2021-02-09 — End: 2021-02-09
  Administered 2021-02-09 (×3): 50 ug via INTRAVENOUS
  Filled 2021-02-09 (×2): qty 2

## 2021-02-09 MED ORDER — PROPOFOL 10 MG/ML IV BOLUS
INTRAVENOUS | Status: DC | PRN
  Administered 2021-02-09: 200 mg via INTRAVENOUS

## 2021-02-09 MED ORDER — OXYCODONE HCL 5 MG PO TABS
5.0000 mg | ORAL_TABLET | ORAL | Status: DC | PRN
  Administered 2021-02-09 (×2): 10 mg via ORAL
  Filled 2021-02-09 (×2): qty 2

## 2021-02-09 MED ORDER — KETOROLAC TROMETHAMINE 30 MG/ML IJ SOLN
INTRAMUSCULAR | Status: AC
  Filled 2021-02-09: qty 1

## 2021-02-09 MED ORDER — PHENYLEPHRINE HCL (PRESSORS) 10 MG/ML IV SOLN
INTRAVENOUS | Status: DC | PRN
  Administered 2021-02-09 (×2): 80 ug via INTRAVENOUS

## 2021-02-09 MED ORDER — PHENYLEPHRINE 40 MCG/ML (10ML) SYRINGE FOR IV PUSH (FOR BLOOD PRESSURE SUPPORT)
PREFILLED_SYRINGE | INTRAVENOUS | Status: AC
  Filled 2021-02-09: qty 10

## 2021-02-09 MED ORDER — ORAL CARE MOUTH RINSE: 15.0000 mL | Freq: Once | OROMUCOSAL | Status: AC

## 2021-02-09 MED ORDER — ZOLPIDEM TARTRATE 5 MG PO TABS: 5.0000 mg | ORAL_TABLET | Freq: Every evening | ORAL | Status: DC | PRN

## 2021-02-09 MED ORDER — DIPHENHYDRAMINE HCL 12.5 MG/5ML PO ELIX: 12.5000 mg | ORAL_SOLUTION | Freq: Four times a day (QID) | ORAL | Status: DC | PRN

## 2021-02-09 MED ORDER — LIDOCAINE HCL (PF) 2 % IJ SOLN
INTRAMUSCULAR | Status: AC
  Filled 2021-02-09: qty 5

## 2021-02-09 MED ORDER — FENTANYL CITRATE (PF) 100 MCG/2ML IJ SOLN
INTRAMUSCULAR | Status: AC
  Filled 2021-02-09: qty 2

## 2021-02-09 SURGICAL SUPPLY — 46 items
APPLIER CLIP 11 MED OPEN (CLIP)
APPLIER CLIP 9.375 SM OPEN (CLIP) ×4
BINDER BREAST LRG (GAUZE/BANDAGES/DRESSINGS) IMPLANT
BINDER BREAST XLRG (GAUZE/BANDAGES/DRESSINGS) ×2 IMPLANT
CHLORAPREP W/TINT 26 (MISCELLANEOUS) ×2 IMPLANT
CLIP APPLIE 11 MED OPEN (CLIP) IMPLANT
CLIP APPLIE 9.375 SM OPEN (CLIP) ×2 IMPLANT
CLOTH BEACON ORANGE TIMEOUT ST (SAFETY) ×2 IMPLANT
COVER LIGHT HANDLE STERIS (MISCELLANEOUS) ×4 IMPLANT
COVER WAND RF STERILE (DRAPES) ×2 IMPLANT
DRAPE HALF SHEET 40X57 (DRAPES) ×2 IMPLANT
ELECT REM PT RETURN 9FT ADLT (ELECTROSURGICAL) ×2
ELECTRODE REM PT RTRN 9FT ADLT (ELECTROSURGICAL) ×1 IMPLANT
EVACUATOR DRAINAGE 10X20 100CC (DRAIN) ×2 IMPLANT
EVACUATOR SILICONE 100CC (DRAIN) ×4
GAUZE SPONGE 4X4 12PLY STRL (GAUZE/BANDAGES/DRESSINGS) ×2 IMPLANT
GLOVE SURG ENC MOIS LTX SZ6.5 (GLOVE) ×2 IMPLANT
GLOVE SURG SS PI 7.5 STRL IVOR (GLOVE) ×2 IMPLANT
GLOVE SURG UNDER POLY LF SZ6.5 (GLOVE) ×2 IMPLANT
GLOVE SURG UNDER POLY LF SZ7 (GLOVE) ×2 IMPLANT
GOWN STRL REUS W/TWL LRG LVL3 (GOWN DISPOSABLE) ×6 IMPLANT
INST SET MINOR GENERAL (KITS) ×2 IMPLANT
KIT TURNOVER KIT A (KITS) ×2 IMPLANT
MANIFOLD NEPTUNE II (INSTRUMENTS) ×2 IMPLANT
NEEDLE HYPO 21X1.5 SAFETY (NEEDLE) ×2 IMPLANT
NS IRRIG 1000ML POUR BTL (IV SOLUTION) ×2 IMPLANT
PACK MINOR (CUSTOM PROCEDURE TRAY) ×2 IMPLANT
PAD ABD 5X9 TENDERSORB (GAUZE/BANDAGES/DRESSINGS) ×2 IMPLANT
PAD ARMBOARD 7.5X6 YLW CONV (MISCELLANEOUS) ×2 IMPLANT
PENCIL SMOKE EVACUATOR (MISCELLANEOUS) ×2 IMPLANT
SET BASIN LINEN APH (SET/KITS/TRAYS/PACK) ×2 IMPLANT
SPONGE DRAIN TRACH 4X4 STRL 2S (GAUZE/BANDAGES/DRESSINGS) ×4 IMPLANT
SPONGE INTESTINAL PEANUT (DISPOSABLE) IMPLANT
SPONGE LAP 18X18 RF (DISPOSABLE) ×8 IMPLANT
STAPLER VISISTAT (STAPLE) ×2 IMPLANT
SUT ETHILON 3 0 FSL (SUTURE) ×2 IMPLANT
SUT SILK 2 0 (SUTURE) ×4
SUT SILK 2 0 SH (SUTURE) ×4 IMPLANT
SUT SILK 2-0 18XBRD TIE 12 (SUTURE) ×2 IMPLANT
SUT VIC AB 2-0 CT1 27 (SUTURE) ×10
SUT VIC AB 2-0 CT1 TAPERPNT 27 (SUTURE) ×5 IMPLANT
SUT VIC AB 3-0 SH 27 (SUTURE)
SUT VIC AB 3-0 SH 27X BRD (SUTURE) IMPLANT
SUT VICRYL AB 2 0 TIES (SUTURE) ×2 IMPLANT
SYR 20ML LL LF (SYRINGE) ×2 IMPLANT
TAPE PAPER 3X10 WHT MICROPORE (GAUZE/BANDAGES/DRESSINGS) ×2 IMPLANT

## 2021-02-09 NOTE — Evaluation (Signed)
Physical Therapy Evaluation Patient Details Name: Elizabeth Graves MRN: [id number] DOB: 06-12-1966 Today's Date: 02/09/2021   History of Present Illness  Elizabeth Graves is a 55 yo with a triple negative breast cancer that was in her lymph nodes and required preoperative neoadjuvant therapy. She had a complete clinical response with no residual mass felt and no adenopathy. We discussed her options of follow up MRI and versus proceeding with a modified radical mastectomy. She decided to proceed with the mastectomy and axillary dissection, and we discussed the risk of bleeding, infection, needing additional surgery or therapy, nerve injury, and lymph edema.     Clinical Impression  Patient does not require assist for bed mobility, transfers, or ambulation today. Patient demonstrates good sitting tolerance and balance while seated EOB. Patient ambulates without AD with slightly decreased cadence from baseline but without loss of balance. Patent educated on completing postural and shoulder ROM exercises and is given handout. Patient discharged to care of nursing for ambulation daily as tolerated for length of stay.     Follow Up Recommendations No PT follow up    Equipment Recommendations  None recommended by PT    Recommendations for Other Services       Precautions / Restrictions Precautions Precaution Comments: s/p Left breast modified radical mastectomy Restrictions Weight Bearing Restrictions: No      Mobility  Bed Mobility Overal bed mobility: Independent                  Transfers Overall transfer level: Independent Equipment used: None                Ambulation/Gait Ambulation/Gait assistance: Supervision Gait Distance (Feet): 150 Feet Assistive device: None Gait Pattern/deviations: Decreased stride length Gait velocity: decreased   General Gait Details: slighly slow cadence without AD, no loss of balance  Stairs            Wheelchair Mobility     Modified Rankin (Stroke Patients Only)       Balance Overall balance assessment: Independent                                           Pertinent Vitals/Pain Pain Assessment: 0-10 Pain Score: 7  Pain Location: L axillary region Pain Descriptors / Indicators: Sore Pain Intervention(s): Limited activity within patient's tolerance;Monitored during session    Home Living Family/patient expects to be discharged to:: Private residence Living Arrangements: Spouse/significant other Available Help at Discharge: Family Type of Home: House Home Access: Stairs to enter Entrance Stairs-Rails: Right;Left;Can reach both Entrance Stairs-Number of Steps: 3 Home Layout: One level;Laundry or work area in basement;Able to live on main level with bedroom/bathroom        Prior Function Level of Independence: Independent         Comments: independent without AD, community ambulator     Journalist, newspaper        Extremity/Trunk Assessment   Upper Extremity Assessment Upper Extremity Assessment: Overall WFL for tasks assessed    Lower Extremity Assessment Lower Extremity Assessment: Overall WFL for tasks assessed    Cervical / Trunk Assessment Cervical / Trunk Assessment: Normal  Communication   Communication: No difficulties  Cognition Arousal/Alertness: Awake/alert Behavior During Therapy: WFL for tasks assessed/performed Overall Cognitive Status: Within Functional Limits for tasks assessed  General Comments      Exercises     Assessment/Plan    PT Assessment Patent does not need any further PT services  PT Problem List         PT Treatment Interventions      PT Goals (Current goals can be found in the Care Plan section)  Acute Rehab PT Goals Patient Stated Goal: Return home PT Goal Formulation: With patient Time For Goal Achievement: 02/09/21 Potential to Achieve Goals: Good     Frequency     Barriers to discharge        Co-evaluation               AM-PAC PT "6 Clicks" Mobility  Outcome Measure Help needed turning from your back to your side while in a flat bed without using bedrails?: None Help needed moving from lying on your back to sitting on the side of a flat bed without using bedrails?: None Help needed moving to and from a bed to a chair (including a wheelchair)?: None Help needed standing up from a chair using your arms (e.g., wheelchair or bedside chair)?: None Help needed to walk in hospital room?: None Help needed climbing 3-5 steps with a railing? : None 6 Click Score: 24    End of Session   Activity Tolerance: Patient tolerated treatment well Patient left: in bed;with call bell/phone within reach Nurse Communication: Mobility status PT Visit Diagnosis: Other abnormalities of gait and mobility (R26.89);Muscle weakness (generalized) (M62.81)    Time: 2297-9892 PT Time Calculation (min) (ACUTE ONLY): 13 min   Charges:   PT Evaluation $PT Eval Low Complexity: 1 Low        2:46 PM, 02/09/21 Mearl Latin PT, DPT Physical Therapist at [facility]

## 2021-02-09 NOTE — Interval H&P Note (Signed)
History and Physical Interval Note:  02/09/2021 7:34 AM  Elizabeth Graves  has presented today for surgery, with the diagnosis of left breast cancer.  The various methods of treatment have been discussed with the patient and family. After consideration of risks, benefits and other options for treatment, the patient has consented to  Procedure(s): MASTECTOMY MODIFIED RADICAL (Left) as a surgical intervention.  The patient's history has been reviewed, patient examined, no change in status, stable for surgery.  I have reviewed the patient's chart and labs.  Questions were answered to the patient's satisfaction.    No questions or changes.  Virl Cagey

## 2021-02-09 NOTE — Anesthesia Preprocedure Evaluation (Addendum)
Anesthesia Evaluation  Patient identified by MRN, date of birth, ID band Patient awake    Reviewed: Allergy & Precautions, H&P , NPO status , Patient's Chart, lab work & pertinent test results, reviewed documented beta blocker date and time   Airway Mallampati: II  TM Distance: >3 FB Neck ROM: full    Dental no notable dental hx. (+) Chipped, Poor Dentition,    Pulmonary asthma , former smoker,    Pulmonary exam normal breath sounds clear to auscultation       Cardiovascular Exercise Tolerance: Good hypertension, negative cardio ROS   Rhythm:regular Rate:Normal     Neuro/Psych PSYCHIATRIC DISORDERS Anxiety Depression  Neuromuscular disease    GI/Hepatic negative GI ROS, Neg liver ROS,   Endo/Other  negative endocrine ROS  Renal/GU negative Renal ROS  negative genitourinary   Musculoskeletal   Abdominal   Peds  Hematology negative hematology ROS (+)   Anesthesia Other Findings   Reproductive/Obstetrics negative OB ROS                            Anesthesia Physical Anesthesia Plan  ASA: III  Anesthesia Plan: General   Post-op Pain Management:    Induction:   PONV Risk Score and Plan: Ondansetron  Airway Management Planned:   Additional Equipment:   Intra-op Plan:   Post-operative Plan:   Informed Consent: I have reviewed the patients History and Physical, chart, labs and discussed the procedure including the risks, benefits and alternatives for the proposed anesthesia with the patient or authorized representative who has indicated his/her understanding and acceptance.     Dental Advisory Given  Plan Discussed with: CRNA  Anesthesia Plan Comments:         Anesthesia Quick Evaluation

## 2021-02-09 NOTE — Progress Notes (Signed)
[facility]  Notified her husband surgery completed. 2 drains in place. PT ordered for postop. Hopefully home tomorrow.  Curlene Labrum, MD [facility] [address] [place], [place] [address] [phone] (office)

## 2021-02-09 NOTE — Op Note (Signed)
[facility] Operative Note  02/09/21  Preoperative Diagnosis:  Triple negative left breast cancer    Postoperative Diagnosis: Same   Procedure(s) Performed: Left breast modified radical mastectomy    Surgeon: Lanell Matar. Constance Haw, MD   Assistants: No qualified resident was available    Anesthesia: General endotracheal   Anesthesiologist: Louann Sjogren, MD    Specimens: Left breast with axillary dissection    Estimated Blood Loss: Minimal   Blood Replacement: None    Complications: None   Wound Class: Clean    Operative Indications:  Ms. Galeana is a 55 yo with a triple negative breast cancer that was in her lymph nodes and required preoperative neoadjuvant therapy. She had a complete clinical response with no residual mass felt and no adenopathy. We discussed her options of follow up MRI and versus proceeding with a modified radical mastectomy. She decided to proceed with the mastectomy and axillary dissection, and we discussed the risk of bleeding, infection, needing additional surgery or therapy, nerve injury, and lymph edema.   Findings: Some hardened lymph nodes in level I    Procedure: The patient was taken to the operating room and placed supine. General endotracheal anesthesia was induced. Intravenous antibiotics were administered per protocol.  The left breast and axilla were prepared and draped in the usual sterile fashion. A JACHO approved time out was performed.   A skin incision was made that encompassed the nipple areola complex and previous biopsy sites and passed in a transverse direction across the breast. Flaps were raised in the avascular plane between the subcutaneous tissue and breast tissue to the clavicle superiorly, the sternum medially, the anterior rectus inferiorly, and the anterior border of the latissimus dorsi muscle laterally.  Hemostasis was achieved.    The breast tissue overlying the pectoralis fascia was incised from the  pectoralis major progressing medial to lateral. At the lateral border of the pectoralis major muscle the breast tissue was swung laterally and dissection under the muscle progressed.   The clavipectoral fascia was then incised along the edge of the pectoralis major and the major and minor were freed from the surrounding fat and nodal tissue.  Dissection progressed first under the pectoralis major and then the pectoralis minor. The pectoral muscles were retracted medially with a Richardson retractor.  The medial pectoral neurovascular bundle was identified and preserved. The level II nodal tissue deep in the pectoralis minor was included in the dissection.  The axillary vein was identified and appeared to be a dual system, the most inferior was cleared of overlying fat and the first branch was clamped and ligated clips and divided.  The thoracodorsal nerve bundle was identified deep to the ligated vein was preserved. The long thoracic nerve was identified on the edge of the latissimus dorsi on the chest wall and preserved. The remaining nodal tissue between the nerves were carefully removed, taking care to protect the nerves.   The specimen consisted of the breast and attached nodal tissue was then excised by dividing the remaining lateral pedicle and sent to pathology after marking suture superior.   The wound was irrigated and hemostasis achieved. Two closed suction drains were placed in operative field over the mastectomy site and the axilla through stab incisions and secured with 3-0 Nylon.  Exparel was injected. The wound was closed with 2-0 Vicryl interrupted in the subcutaneous layer and staples.   The wound was dressed with ABD pads and a mastectomy binder.   Final inspection revealed acceptable  hemostasis. All counts were correct at the end of the case. The patient was awakened from anesthesia and extubated without complication.  The patient went to the PACU in stable condition.   Curlene Labrum, MD [facility] [address] [place], [place] [address] [phone] (office)

## 2021-02-09 NOTE — Anesthesia Procedure Notes (Signed)
Procedure Name: Intubation Date/Time: 02/09/2021 7:31 AM Performed by: Karna Dupes, CRNA Pre-anesthesia Checklist: Patient identified, Emergency Drugs available, Suction available and Patient being monitored Patient Re-evaluated:Patient Re-evaluated prior to induction Oxygen Delivery Method: Circle system utilized Preoxygenation: Pre-oxygenation with 100% oxygen Induction Type: IV induction Ventilation: Mask ventilation without difficulty Laryngoscope Size: Mac and 3 Grade View: Grade I Tube type: Oral Number of attempts: 1 Airway Equipment and Method: Stylet Placement Confirmation: ETT inserted through vocal cords under direct vision,  positive ETCO2 and breath sounds checked- equal and bilateral Secured at: 21 cm Tube secured with: Tape Dental Injury: Teeth and Oropharynx as per pre-operative assessment

## 2021-02-09 NOTE — Anesthesia Postprocedure Evaluation (Signed)
Anesthesia Post Note  Patient: Elizabeth Graves  Procedure(s) Performed: MASTECTOMY MODIFIED RADICAL (Left Breast)  Patient location during evaluation: PACU Anesthesia Type: General Level of consciousness: awake and alert and oriented Pain management: satisfactory to patient Vital Signs Assessment: post-procedure vital signs reviewed and stable Respiratory status: spontaneous breathing, respiratory function stable and patient connected to nasal cannula oxygen Cardiovascular status: blood pressure returned to baseline and stable Postop Assessment: no apparent nausea or vomiting Anesthetic complications: no   No complications documented.   Last Vitals:  Vitals:   02/09/21 1030 02/09/21 1045  BP: 128/85 (!) 144/94  Pulse: 100 99  Resp: 16 15  Temp:    SpO2: 100% 100%    Last Pain:  Vitals:   02/09/21 1049  TempSrc:   PainSc: [name]

## 2021-02-09 NOTE — Transfer of Care (Signed)
Immediate Anesthesia Transfer of Care Note  Patient: Elizabeth Graves  Procedure(s) Performed: MASTECTOMY MODIFIED RADICAL (Left Breast)  Patient Location: PACU  Anesthesia Type:General  Level of Consciousness: awake  Airway & Oxygen Therapy: Patient Spontanous Breathing and Patient connected to nasal cannula oxygen  Post-op Assessment: Report given to RN and Post -op Vital signs reviewed and stable  Post vital signs: Reviewed and stable  Last Vitals:  Vitals Value Taken Time  BP 156/95 02/09/21 1010  Temp    Pulse 94 02/09/21 1012  Resp 15 02/09/21 1012  SpO2 97 % 02/09/21 1012  Vitals shown include unvalidated device data.  Last Pain:  Vitals:   02/09/21 0731  TempSrc: Oral  PainSc: 0-No pain      Patients Stated Pain Goal: 6 (97/58/83 2549)  Complications: No complications documented.

## 2021-02-10 DIAGNOSIS — C50912 Malignant neoplasm of unspecified site of left female breast: Secondary | ICD-10-CM | POA: Diagnosis not present

## 2021-02-10 LAB — BASIC METABOLIC PANEL
Anion gap: 9 (ref 5–15)
BUN: 19 mg/dL (ref 6–20)
CO2: 26 mmol/L (ref 22–32)
Calcium: 9 mg/dL (ref 8.9–10.3)
Chloride: 100 mmol/L (ref 98–111)
Creatinine, Ser: 0.84 mg/dL (ref 0.44–1.00)
GFR, Estimated: >60 mL/min (ref >60)
Glucose, Bld: 169 mg/dL — ABNORMAL HIGH (ref 70–99)
Potassium: 4 mmol/L (ref 3.5–5.1)
Sodium: 135 mmol/L (ref 135–145)

## 2021-02-10 LAB — CBC
HCT: 28 % — ABNORMAL LOW (ref 36.0–46.0)
Hemoglobin: 8.8 g/dL — ABNORMAL LOW (ref 12.0–15.0)
MCH: 31.9 pg (ref 26.0–34.0)
MCHC: 31.4 g/dL (ref 30.0–36.0)
MCV: 101.4 fL — ABNORMAL HIGH (ref 80.0–100.0)
Platelets: 225 10*3/uL (ref 150–400)
RBC: 2.76 MIL/uL — ABNORMAL LOW (ref 3.87–5.11)
RDW: 14.4 % (ref 11.5–15.5)
WBC: 11 10*3/uL — ABNORMAL HIGH (ref 4.0–10.5)
nRBC: 0 % (ref 0.0–0.2)

## 2021-02-10 LAB — HIV ANTIBODY (ROUTINE TESTING W REFLEX): HIV Screen 4th Generation wRfx: NONREACTIVE

## 2021-02-10 MED ORDER — OXYCODONE HCL 5 MG PO TABS: 5.0000 mg | ORAL_TABLET | ORAL | 0 refills | Status: DC | PRN

## 2021-02-10 MED ORDER — ONDANSETRON 4 MG PO TBDP: 4.0000 mg | ORAL_TABLET | Freq: Four times a day (QID) | ORAL | 0 refills | Status: DC | PRN

## 2021-02-10 NOTE — Discharge Summary (Signed)
Physician Discharge Summary  Patient ID: Elizabeth Graves MRN: [id number] DOB/AGE: October 10, 1966 55 y.o.  Admit date: 02/09/2021 Discharge date: 02/10/2021  Admission Diagnoses: Breast cancer   Discharge Diagnoses:  Principal Problem:   Triple negative malignant neoplasm of breast [facility]) Active Problems:   Breast cancer, left [facility])   Discharged Condition: good  Hospital Course: Ms. Elizabeth Graves is a 55 yo with left breast cancer s/p neoadjuvant therapy who underwent a modified radical mastectomy and did well post op. She has been educated on exercises by PT and is moving her arm and shoulder. She has been educated on drain care. She has adequate pain control and is tolerating her diet.   Consults: Physical  Therapy  Significant Diagnostic Studies:  Results for LYNSAY, FESPERMAN (MRN [id number]) as of 02/10/2021 13:44  Ref. Range 02/10/2021 06:21  Sodium Latest Ref Range: 135 - 145 mmol/L 135  Potassium Latest Ref Range: 3.5 - 5.1 mmol/L 4.0  Chloride Latest Ref Range: 98 - 111 mmol/L 100  CO2 Latest Ref Range: 22 - 32 mmol/L 26  Glucose Latest Ref Range: 70 - 99 mg/dL 169 (H)  BUN Latest Ref Range: 6 - 20 mg/dL 19  Creatinine Latest Ref Range: 0.44 - 1.00 mg/dL 0.84  Calcium Latest Ref Range: 8.9 - 10.3 mg/dL 9.0  Anion gap Latest Ref Range: 5 - 15  9  GFR, Estimated Latest Ref Range: >60 mL/min >60  WBC Latest Ref Range: 4.0 - 10.5 K/uL 11.0 (H)  RBC Latest Ref Range: 3.87 - 5.11 MIL/uL 2.76 (L)  Hemoglobin Latest Ref Range: 12.0 - 15.0 g/dL 8.8 (L)  HCT Latest Ref Range: 36.0 - 46.0 % 28.0 (L)  MCV Latest Ref Range: 80.0 - 100.0 fL 101.4 (H)  MCH Latest Ref Range: 26.0 - 34.0 pg 31.9  MCHC Latest Ref Range: 30.0 - 36.0 g/dL 31.4  RDW Latest Ref Range: 11.5 - 15.5 % 14.4  Platelets Latest Ref Range: 150 - 400 K/uL 225  nRBC Latest Ref Range: 0.0 - 0.2 % 0.0   Treatments: Modified radical mastectomy on left 02/09/2021  Discharge Exam: Blood pressure 117/89, pulse 86, temperature 97.8 F  (36.6 C), temperature source Oral, resp. rate 18, height 4\' 9"  (1.448 m), weight 72.6 kg, SpO2 (!) 67 %. General appearance: alert, cooperative and no distress Resp: normal work of breathing Breasts: left mastectomy incision c/d/i with staples, some indenting ni the medial aspect from excision of fat over sternum, drains with SS output  Disposition: Discharge disposition: 01-Home or Self Care       Discharge Instructions    Call MD for:  difficulty breathing, headache or visual disturbances   Complete by: As directed    Call MD for:  extreme fatigue   Complete by: As directed    Call MD for:  persistant dizziness or light-headedness   Complete by: As directed    Call MD for:  persistant nausea and vomiting   Complete by: As directed    Call MD for:  redness, tenderness, or signs of infection (pain, swelling, redness, odor or green/yellow discharge around incision site)   Complete by: As directed    Call MD for:  severe uncontrolled pain   Complete by: As directed    Call MD for:  temperature >100.4   Complete by: As directed    Increase activity slowly   Complete by: As directed      Allergies as of 02/10/2021   No Known Allergies     Medication List  TAKE these medications   albuterol 108 (90 Base) MCG/ACT inhaler Commonly known as: VENTOLIN HFA TAKE 2 PUFFS BY MOUTH EVERY 6 HOURS AS NEEDED FOR WHEEZE OR SHORTNESS OF BREATH What changed: See the new instructions.   atorvastatin 20 MG tablet Commonly known as: LIPITOR Take 1 tablet (20 mg total) by mouth daily.   diphenhydrAMINE 25 mg capsule Commonly known as: BENADRYL Take 25 mg by mouth every 6 (six) hours as needed for allergies. Allergies.   esomeprazole 20 MG capsule Commonly known as: NEXIUM Take 20 mg by mouth daily before breakfast.   ibuprofen 200 MG tablet Commonly known as: ADVIL Take 400-600 mg by mouth every 8 (eight) hours as needed for moderate pain (pain.).   lidocaine-prilocaine  cream Commonly known as: EMLA Apply a pea sized amount to port a cath site and cover with plastic wrap 1 hour prior to chemotherapy appointments   lisinopril 5 MG tablet Commonly known as: ZESTRIL Take 1 tablet (5 mg total) by mouth daily.   loratadine 10 MG tablet Commonly known as: CLARITIN Take 10 mg by mouth daily.   naproxen sodium 220 MG tablet Commonly known as: ALEVE Take 440 mg by mouth 3 (three) times daily as needed (pain.).   ondansetron 4 MG disintegrating tablet Commonly known as: ZOFRAN-ODT Take 1 tablet (4 mg total) by mouth every 6 (six) hours as needed for nausea.   oxyCODONE 5 MG immediate release tablet Commonly known as: Oxy IR/ROXICODONE Take 1 tablet (5 mg total) by mouth every 4 (four) hours as needed for severe pain or breakthrough pain.   potassium chloride 10 MEQ tablet Commonly known as: KLOR-CON TAKE 1 TABLET BY MOUTH EVERY DAY   prochlorperazine 10 MG tablet Commonly known as: COMPAZINE Take 1 tablet (10 mg total) by mouth every 6 (six) hours as needed (Nausea or vomiting).   sertraline 50 MG tablet Commonly known as: ZOLOFT Take 1 tablet (50 mg total) by mouth daily.       Follow-up Information    Virl Cagey, MD Follow up on 02/15/2021.   Specialty: General Surgery Why: drain check and possible removal  Contact information: [address] [place] [facility] [address] [phone]               Signed: Virl Cagey 02/10/2021, 1:45 PM

## 2021-02-10 NOTE — Discharge Instructions (Signed)
Drain Care: Please keep the drain clean and dry. Please do not mess with or cut the stitch that is keeping the drain in place. Secure the drain to your clothes so that it does not get dislodged.  You may want to wear a binder around at night for sleeping if you are worried about it getting pulled out.  Please record the output from the drain daily including the color and the amount in milliliters.  Please strip the drain tube as instructed by your nurse. Please keep the drain covered with plastic and tape when you shower so that it does not get wet.   Discharge instructions after breast surgery:   Common Complaints: Pain and bruising at the incision sites.  Swelling at the incision sites. Stiffness of the arm.   Diet/ Activity: Diet as tolerated.  You may shower but do not submerge or get the drains wet. Pat everything dry after.  Cover the incision with a dry clean pad daily.  Rest and listen to your body, but do not remain in bed all day.  Walk everyday for at least 15-20 minutes. Deep cough and move around every 1-2 hours in the first few days after surgery.  Do not lift > 10 lbs after surgery.  Do not do anything that makes you feel like you are putting unnecessary pull or stretch on the incision sites.  Do move your arm and shoulder (see exercises options below). If you do not move then you can get stiff and hurt more.  Do not pick at the staples your incision sites.  Do not place lotions or balms on your incision unless instructed to specifically by Dr. Constance Haw.   Pain Expectations and Narcotics: -After surgery you will have pain associated with your incisions and this is normal. The pain is muscular and nerve pain, and will get better with time. -You are encouraged and expected to take non narcotic medications like tylenol and ibuprofen (when able) to treat pain as multiple modalities can aid with pain treatment. -Narcotics are only used when pain is severe or there is  breakthrough pain. -You are not expected to have a pain score of 0 after surgery, as we cannot prevent pain. A pain score of 3-4 that allows you to be functional, move, walk, and tolerate some activity is the goal. The pain will continue to improve over the days after surgery and is dependent on your surgery. -Due to [place] law, we are only able to give a certain amount of pain medication to treat post operative pain, and we only give additional narcotics on a patient by patient basis.  -For most laparoscopic surgery, studies have shown that the majority of patients only need 10-15 narcotic pills, and for open surgeries most patients only need 15-20.   -Having appropriate expectations of pain and knowledge of pain management with non narcotics is important as we do not want anyone to become addicted to narcotic pain medication.  -Using ice packs in the first 48 hours and heating pads after 48 hours, wearing an abdominal binder (when recommended), and using over the counter medications are all ways to help with pain management.   -Simple acts like meditation and mindfulness practices after surgery can also help with pain control and research has proven the benefit of these practices.  Medication: Take tylenol and ibuprofen as needed for pain control, alternating every 4-6 hours.  Example:  Tylenol 1000mg  @ 6am, 12noon, 6pm, 57midnight (Do not exceed 4000mg  of tylenol a  day). Ibuprofen 800mg  @ 9am, 3pm, 9pm, 3am (Do not exceed 3600mg  of ibuprofen a day).  Take Roxicodone for breakthrough pain every 4 hours.  Take Colace for constipation related to narcotic pain medication. If you do not have a bowel movement in 2 days, take Miralax over the counter.  Drink plenty of water to also prevent constipation.   Contact Information: If you have questions or concerns, please call our office, [phone], Monday- Thursday 8AM-5PM and Friday 8AM-12Noon.  If it is after hours or on the weekend, please call [facility]  Main Number, [phone], [phone], and ask to speak to the surgeon on call for Dr. Constance Haw at [facility].   Exercises After Breast Surgery Please do the exercises provided by physical therapy as instructed on a daily basis.   Total or Modified Radical Mastectomy, Care After This sheet gives you information about how to care for yourself after your procedure. Your health care provider may also give you more specific instructions. If you have problems or questions, contact your health care provider. What can I expect after the procedure? After the procedure, it is common to have:  Pain.  Numbness.  Stiffness in the arm or shoulder.  Feelings of stress, sadness, or depression. If the lymph nodes under your arm were removed, you may have arm swelling, weakness, or numbness on the same side of your body as your surgery. Follow these instructions at home: Incision care  Follow instructions from your health care provider about how to take care of your incision. Make sure you: ? Wash your hands with soap and water before you change your bandage (dressing). If soap and water are not available, use hand sanitizer. ? Change your dressing as told by your health care provider. ? Leave stitches (sutures), skin glue, or adhesive strips in place. These skin closures may need to stay in place for 2 weeks or longer. If adhesive strip edges start to loosen and curl up, you may trim the loose edges. Do not remove adhesive strips completely unless your health care provider tells you to do that.  Check your incision area every day for signs of infection. Check for: ? Redness, swelling, or more pain. ? Fluid or blood. ? Warmth. ? Pus or a bad smell.  If you were sent home with a surgical drain in place, follow instructions from your health care provider about emptying it.   Bathing  Do not take baths, swim, or use a hot tub until your health care provider approves. You may shower but do not get  drains wet. Activity  Return to your normal activities as told by your health care provider. Ask your health care provider what activities are safe for you.  Avoid activities that take a lot of effort.  Be careful to avoid any activities that could cause an injury to your arm on the side of your surgery.  Do not lift anything that is heavier than 10 lb (4.5 kg), or the limit that you are told, until your health care provider says that it is safe.  Avoid lifting with the arm on the side of your surgery.  Do not carry heavy objects on your shoulder.  After your drain is removed, do exercises to prevent stiffness and swelling in your arm. Talk with your health care provider about which exercises are safe for you.   General instructions  Take over-the-counter and prescription medicines only as told by your health care provider.  You may eat what you usually  do.  Keep your arm raised (elevated) above the level of your heart when you are sitting or lying down.  Do not wear tight jewelry on your arm, wrist, or fingers on the side of your surgery.  You may be given a tight sleeve (compression bandage) to wear over your arm on the side of your surgery. Wear this sleeve as told by your health care provider.  Ask your health care provider when you can start wearing a bra or using a breast prosthesis.  Before you are involved in certain procedures such as giving blood or having your blood pressure checked, tell all your health care providers if lymph nodes under your arm were removed. This is important information. Follow-up  Keep all follow-up visits as told by your health care provider. This is important.  Get checked for extra fluid around your lymph nodes (lymphedema) as often as told by your health care provider. Contact a health care provider if:  You have a fever.  Your pain medicine is not working.  Your arm swelling, weakness, or numbness has not improved after a few  weeks.  You have new swelling in your breast area or arm.  You have redness, swelling, or more pain in your incision area.  You have fluid or blood coming from your incision.  Your incision feels warm to the touch.  You have pus or a bad smell coming from your incision. Get help right away if:  You have very bad pain in your breast area or arm.  You have chest pain.  You have difficulty breathing. Summary  Follow instructions from your health care provider about how to take care of your incision. Check your incision area every day for signs of infection.  Ask your health care provider what activities are safe for you.  Keep all follow-up visits as told by your health care provider. This is important.  Make sure you know which symptoms should cause you to contact your health care provider or to get help right away. This information is not intended to replace advice given to you by your health care provider. Make sure you discuss any questions you have with your health care provider. Document Revised: 07/07/2020 Document Reviewed: 07/07/2020 Elsevier Patient Education  [place].

## 2021-02-12 ENCOUNTER — Encounter (HOSPITAL_COMMUNITY): Payer: Self-pay | Admitting: General Surgery

## 2021-02-13 LAB — SURGICAL PATHOLOGY

## 2021-02-15 ENCOUNTER — Ambulatory Visit (INDEPENDENT_AMBULATORY_CARE_PROVIDER_SITE_OTHER): Payer: 59 | Admitting: General Surgery

## 2021-02-15 ENCOUNTER — Other Ambulatory Visit: Payer: Self-pay

## 2021-02-15 ENCOUNTER — Encounter: Payer: Self-pay | Admitting: General Surgery

## 2021-02-15 ENCOUNTER — Encounter (INDEPENDENT_AMBULATORY_CARE_PROVIDER_SITE_OTHER): Payer: 59 | Admitting: General Surgery

## 2021-02-15 VITALS — BP 134/85 | HR 100 | Temp 97.1°F | Resp 16 | Ht <= 58 in | Wt 161.0 lb

## 2021-02-15 DIAGNOSIS — Z171 Estrogen receptor negative status [ER-]: Secondary | ICD-10-CM

## 2021-02-15 DIAGNOSIS — C50012 Malignant neoplasm of nipple and areola, left female breast: Secondary | ICD-10-CM

## 2021-02-15 NOTE — Patient Instructions (Signed)
Ok to shower. Keep area dry otherwise. Keep bandage in place for 48 hours and replace if gets saturated before then.

## 2021-02-15 NOTE — Progress Notes (Signed)
[facility] Note   HPI:  55 y.o. Female presents to clinic for post-op follow-up evaluation of her left modified radical mastectomy. She is doing well and doing PT exercises. Her drains has been putting about about 20-40 cc of serosanguinous drainage each. This has been decreasing. Minor bruising.   Review of Systems:  Some numbness and soreness in axilla  All other review of systems: otherwise negative   Vital Signs:  BP 134/85   Pulse 100   Temp (!) 97.1 F (36.2 C) (Other (Comment))   Resp 16   Ht 4\' 9"  (1.448 m)   Wt 161 lb (73 kg)   SpO2 94%   BMI 34.84 kg/m    Physical Exam:  Physical Exam Vitals reviewed.  Cardiovascular:     Rate and Rhythm: Normal rate.  Pulmonary:     Effort: Pulmonary effort is normal.  Chest:     Comments: Left mastectomy incision c/d/i with no erythema or drainage, minor bruising, medial aspect indected some from fat removal on sternum, monitor, axilla drain and mastectomy drain with SS output, moved drains and dressing placed, 1/2 staples removed  Neurological:     Mental Status: She is alert.    Pathology: FINAL MICROSCOPIC DIAGNOSIS:   A. MASTECTOMY, LEFT, AND AXILLARY CONTENTS:  - Invasive ductal carcinoma, grade 2, spanning 0.2 cm.  - Resection margins are negative for carcinoma.  - Treatment effect in the breast and lymph node.  - Biopsy site.  - Fibrocystic and fibroadenomatoid change.  - Incidental radial scar.  - Twelve of twelve lymph nodes negative for carcinoma (0/12), one with  biopsy site.  - See oncology table.   Assessment:  55 y.o. yo Female with breast cancer s/p left modified mastectomy after neoadjuvant therapy. Doing well.  Plan:  -Will see next week for follow up and remaining staple removal Ok to shower. Keep area dry otherwise. Keep bandage in place for 48 hours and replace if gets saturated before then.    Future Appointments  Date Time Provider [place]  02/22/2021  1:45 PM  Virl Cagey, MD [facility] None  03/12/2021  3:00 PM [facility] INJ NURSE [facility] None  03/12/2021  3:45 PM Derek Jack, MD [facility] None     Curlene Labrum, MD [facility] [address] [place], [place] [address] [phone] (office)

## 2021-02-16 NOTE — Telephone Encounter (Signed)
Error in creating encounter. See post operative note from 02/15/2021.   Curlene Labrum, MD [facility] [address] [place], [place] [address] [phone] (office)

## 2021-02-22 ENCOUNTER — Encounter: Payer: Self-pay | Admitting: General Surgery

## 2021-02-22 ENCOUNTER — Other Ambulatory Visit: Payer: Self-pay

## 2021-02-22 ENCOUNTER — Ambulatory Visit (INDEPENDENT_AMBULATORY_CARE_PROVIDER_SITE_OTHER): Payer: 59 | Admitting: General Surgery

## 2021-02-22 VITALS — BP 116/79 | HR 86 | Temp 97.8°F | Resp 16 | Ht <= 58 in | Wt 162.0 lb

## 2021-02-22 DIAGNOSIS — L7634 Postprocedural seroma of skin and subcutaneous tissue following other procedure: Secondary | ICD-10-CM

## 2021-02-22 DIAGNOSIS — Z171 Estrogen receptor negative status [ER-]: Secondary | ICD-10-CM

## 2021-02-22 DIAGNOSIS — C50012 Malignant neoplasm of nipple and areola, left female breast: Secondary | ICD-10-CM

## 2021-02-22 NOTE — Progress Notes (Signed)
[facility] Note   HPI:  55 y.o. Female presents to clinic for post-op follow-up evaluation of her left mastectomy site. Patient reports doing well and having some swelling and soreness but manageable.   Review of Systems:  Numb under arm Swelling under arm All other review of systems: otherwise negative   Vital Signs:  BP 116/79   Pulse 86   Temp 97.8 F (36.6 C) (Other (Comment))   Resp 16   Ht 4\' 9"  (1.448 m)   Wt 162 lb (73.5 kg)   SpO2 98%   BMI 35.06 kg/m    Physical Exam:  Physical Exam Vitals reviewed.  Cardiovascular:     Rate and Rhythm: Normal rate.  Pulmonary:     Effort: Pulmonary effort is normal.  Chest:     Comments: Left mastectomy incision healing, staples removed and steri strips placed, no erythema or drainage, seroma in axilla, nontender, moves arm Neurological:     Mental Status: She is alert.      Assessment:  55 y.o. yo Female s/p modified radical mastectomy. Doing well and does have a seroma that is not symptomatic right now. Will have her monitor, may need to aspirate if causes pain or issues with mobility.  Plan:  - Continue exercises  - Follow up with Dr. Delton Coombes - Let us know if issues, PRN follow up   Curlene Labrum, [place], [place] [address] [phone] (office)

## 2021-02-22 NOTE — Patient Instructions (Signed)
Seroma A seroma is a collection of fluid on the body that looks like swelling or a mass. Seromas form where tissue has been injured or cut. Seromas vary in size. Some are small and painless. Others may become large and cause pain or discomfort. Many seromas go away on their own as the fluid is naturally absorbed by the body, and some seromas need to be drained. What are the causes? Seromas form as the result of damage to tissue or the removal of tissue. This tissue damage may happen during surgery or because of an injury or trauma. When tissue is disrupted or removed, empty space is created. The body's natural defense system (immune system) causes fluid to enter the empty space and form a seroma. What are the signs or symptoms? Symptoms of this condition include:  Swelling at the site of a surgical incision or an injury.  Drainage of clear fluid at the surgery or injury site.  Discomfort or pain. How is this diagnosed? This condition is diagnosed based on:  Your symptoms.  Your medical history.  A physical exam. During the exam, your health care provider will press on the seroma. You may also have tests, including:  Blood tests.  Imaging tests, such as an ultrasound or a CT scan. How is this treated? Some seromas go away on their own. Your health care provider may monitor you to make sure the seroma does not cause any problems. If your seroma does not go away on its own, treatment may include:  Using a needle to drain the fluid from the seroma (needle aspiration).  Inserting a small, thin tube (catheter) to drain the fluid.  Applying a bandage (dressing), such as an elastic bandage or binder.  Taking antibiotic medicines, if the seroma becomes infected. In rare cases, surgery may be done to remove the seroma and repair the area. Follow these instructions at home:  If you were prescribed an antibiotic medicine, take it as told by your health care provider. Do not stop using the  antibiotic even if you start to feel better.  Take over-the-counter and prescription medicines only as told by your health care provider.  Return to your normal activities as told by your health care provider. Ask your health care provider what activities are safe for you.  Check your seroma every day for signs of infection. Check for: ? Redness or pain. ? More swelling. ? More fluid. ? Warmth. ? Pus or a bad smell.  Keep all follow-up visits as told by your health care provider. This is important.   Contact a health care provider if:  You have a fever.  You have redness or pain at the site of the seroma.  Your seroma is more swollen or is getting bigger.  You have more fluid coming from the seroma.  Your seroma feels warm to the touch.  You have pus or a bad smell coming from the seroma. Get help right away if:  You have a fever along with severe pain, redness at the site, or chills.  You feel confused or have trouble staying awake.  You feel like your heart is beating very fast.  You feel short of breath or are breathing rapidly.  You have cool, clammy, or sweaty skin. Summary  Seromas can form as a result of injury or surgery on tissue.  Seromas can cause swelling, drainage of clear fluid, and discomfort or pain at the site of the tissue injury.  Some seromas go away on   their own. Other seromas may need to be drained.  Check your seroma every day for signs of infection, such as pain, more swelling, more fluid, warmth, or pus or a bad smell. This information is not intended to replace advice given to you by your health care provider. Make sure you discuss any questions you have with your health care provider. Document Revised: 11/08/2019 Document Reviewed: 11/08/2019 Elsevier Patient Education  2021 [employer]  

## 2021-02-27 ENCOUNTER — Ambulatory Visit (HOSPITAL_COMMUNITY): Payer: BC Managed Care – PPO | Admitting: Hematology

## 2021-03-08 ENCOUNTER — Telehealth: Payer: Self-pay

## 2021-03-08 NOTE — Telephone Encounter (Signed)
Please advise. Thank you

## 2021-03-08 NOTE — Telephone Encounter (Signed)
It does seem reasonable to try to get it through the cancer center She should request if the cancer center could see if social services with the hospital can get her any type of indigent care discount Plus also she showed go through social services with the county to see if she qualifies for any type of emergency Medicaid Or if through government services can qualify for [employer] care at a reasonable price

## 2021-03-08 NOTE — Telephone Encounter (Signed)
Patient notified and stated she does not qualify for assistance or Medicaid due to husbands income and has insurance but still owes on her portion of the bill. Patient will get her blood work done thru the cancer center with her next visit

## 2021-03-08 NOTE — Telephone Encounter (Signed)
Pt is unable to get the blood work done because she owes money to [employer] she lost her job where she has cancer she is going to ask the cancer doctor if they can do the blood work.  Pt call back [phone]

## 2021-03-12 ENCOUNTER — Inpatient Hospital Stay (HOSPITAL_COMMUNITY): Payer: 59

## 2021-03-12 ENCOUNTER — Other Ambulatory Visit (HOSPITAL_COMMUNITY): Payer: Self-pay | Admitting: Surgery

## 2021-03-12 ENCOUNTER — Other Ambulatory Visit: Payer: Self-pay

## 2021-03-12 ENCOUNTER — Encounter (HOSPITAL_COMMUNITY): Payer: Self-pay | Admitting: Hematology

## 2021-03-12 ENCOUNTER — Inpatient Hospital Stay (HOSPITAL_COMMUNITY): Payer: 59 | Attending: Hematology | Admitting: Hematology

## 2021-03-12 VITALS — BP 150/93 | HR 110 | Temp 98.4°F | Resp 16

## 2021-03-12 DIAGNOSIS — Z87891 Personal history of nicotine dependence: Secondary | ICD-10-CM | POA: Diagnosis not present

## 2021-03-12 DIAGNOSIS — Z803 Family history of malignant neoplasm of breast: Secondary | ICD-10-CM | POA: Diagnosis not present

## 2021-03-12 DIAGNOSIS — Z800 Family history of malignant neoplasm of digestive organs: Secondary | ICD-10-CM | POA: Diagnosis not present

## 2021-03-12 DIAGNOSIS — Z9012 Acquired absence of left breast and nipple: Secondary | ICD-10-CM | POA: Diagnosis not present

## 2021-03-12 DIAGNOSIS — Z171 Estrogen receptor negative status [ER-]: Secondary | ICD-10-CM | POA: Diagnosis present

## 2021-03-12 DIAGNOSIS — D702 Other drug-induced agranulocytosis: Secondary | ICD-10-CM

## 2021-03-12 DIAGNOSIS — C539 Malignant neoplasm of cervix uteri, unspecified: Secondary | ICD-10-CM

## 2021-03-12 DIAGNOSIS — C50012 Malignant neoplasm of nipple and areola, left female breast: Secondary | ICD-10-CM | POA: Insufficient documentation

## 2021-03-12 DIAGNOSIS — Z801 Family history of malignant neoplasm of trachea, bronchus and lung: Secondary | ICD-10-CM | POA: Diagnosis not present

## 2021-03-12 DIAGNOSIS — E876 Hypokalemia: Secondary | ICD-10-CM | POA: Insufficient documentation

## 2021-03-12 DIAGNOSIS — Z8541 Personal history of malignant neoplasm of cervix uteri: Secondary | ICD-10-CM | POA: Insufficient documentation

## 2021-03-12 DIAGNOSIS — R200 Anesthesia of skin: Secondary | ICD-10-CM | POA: Diagnosis not present

## 2021-03-12 DIAGNOSIS — C50919 Malignant neoplasm of unspecified site of unspecified female breast: Secondary | ICD-10-CM | POA: Diagnosis not present

## 2021-03-12 DIAGNOSIS — Z95828 Presence of other vascular implants and grafts: Secondary | ICD-10-CM

## 2021-03-12 DIAGNOSIS — C773 Secondary and unspecified malignant neoplasm of axilla and upper limb lymph nodes: Secondary | ICD-10-CM | POA: Insufficient documentation

## 2021-03-12 LAB — RETICULOCYTES
Immature Retic Fract: 13.5 % (ref 2.3–15.9)
RBC.: 4.04 MIL/uL (ref 3.87–5.11)
Retic Count, Absolute: 63.4 10*3/uL (ref 19.0–186.0)
Retic Ct Pct: 1.6 % (ref 0.4–3.1)

## 2021-03-12 LAB — CBC WITH DIFFERENTIAL/PLATELET
Abs Immature Granulocytes: 0.03 10*3/uL (ref 0.00–0.07)
Basophils Absolute: 0 10*3/uL (ref 0.0–0.1)
Basophils Relative: 0 %
Eosinophils Absolute: 0.2 10*3/uL (ref 0.0–0.5)
Eosinophils Relative: 2 %
HCT: 38 % (ref 36.0–46.0)
Hemoglobin: 12.2 g/dL (ref 12.0–15.0)
Immature Granulocytes: 0 %
Lymphocytes Relative: 23 %
Lymphs Abs: 2.2 10*3/uL (ref 0.7–4.0)
MCH: 29.9 pg (ref 26.0–34.0)
MCHC: 32.1 g/dL (ref 30.0–36.0)
MCV: 93.1 fL (ref 80.0–100.0)
Monocytes Absolute: 0.7 10*3/uL (ref 0.1–1.0)
Monocytes Relative: 8 %
Neutro Abs: 6.2 10*3/uL (ref 1.7–7.7)
Neutrophils Relative %: 67 %
Platelets: 255 10*3/uL (ref 150–400)
RBC: 4.08 MIL/uL (ref 3.87–5.11)
RDW: 12.8 % (ref 11.5–15.5)
WBC: 9.3 10*3/uL (ref 4.0–10.5)
nRBC: 0 % (ref 0.0–0.2)

## 2021-03-12 LAB — COMPREHENSIVE METABOLIC PANEL
ALT: 24 U/L (ref 0–44)
AST: 23 U/L (ref 15–41)
Albumin: 4 g/dL (ref 3.5–5.0)
Alkaline Phosphatase: 81 U/L (ref 38–126)
Anion gap: 12 (ref 5–15)
BUN: 13 mg/dL (ref 6–20)
CO2: 20 mmol/L — ABNORMAL LOW (ref 22–32)
Calcium: 9.4 mg/dL (ref 8.9–10.3)
Chloride: 102 mmol/L (ref 98–111)
Creatinine, Ser: 0.85 mg/dL (ref 0.44–1.00)
GFR, Estimated: >60 mL/min (ref >60)
Glucose, Bld: 143 mg/dL — ABNORMAL HIGH (ref 70–99)
Potassium: 3.4 mmol/L — ABNORMAL LOW (ref 3.5–5.1)
Sodium: 134 mmol/L — ABNORMAL LOW (ref 135–145)
Total Bilirubin: 0.5 mg/dL (ref 0.3–1.2)
Total Protein: 7.8 g/dL (ref 6.5–8.1)

## 2021-03-12 LAB — LACTATE DEHYDROGENASE: LDH: 127 U/L (ref 98–192)

## 2021-03-12 LAB — LIPID PANEL
Cholesterol: 170 mg/dL (ref 0–200)
HDL: 31 mg/dL — ABNORMAL LOW (ref >40)
LDL Cholesterol: 88 mg/dL (ref 0–99)
Total CHOL/HDL Ratio: 5.5 RATIO
Triglycerides: 256 mg/dL — ABNORMAL HIGH (ref <150)
VLDL: 51 mg/dL — ABNORMAL HIGH (ref 0–40)

## 2021-03-12 LAB — TSH: TSH: 0.831 u[IU]/mL (ref 0.350–4.500)

## 2021-03-12 LAB — FOLATE: Folate: 5.8 ng/mL — ABNORMAL LOW (ref >5.9)

## 2021-03-12 LAB — VITAMIN B12: Vitamin B-12: 111 pg/mL — ABNORMAL LOW (ref 180–914)

## 2021-03-12 MED ORDER — HEPARIN SOD (PORK) LOCK FLUSH 100 UNIT/ML IV SOLN
500.0000 [IU] | Freq: Once | INTRAVENOUS | Status: AC
  Administered 2021-03-12: 500 [IU] via INTRAVENOUS

## 2021-03-12 MED ORDER — PROCHLORPERAZINE MALEATE 10 MG PO TABS: 10.0000 mg | ORAL_TABLET | Freq: Four times a day (QID) | ORAL | 6 refills | Status: DC | PRN

## 2021-03-12 MED ORDER — CAPECITABINE 500 MG PO TABS: 1000.0000 mg/m2 | ORAL_TABLET | Freq: Two times a day (BID) | ORAL | 2 refills | Status: DC

## 2021-03-12 MED ORDER — SODIUM CHLORIDE 0.9% FLUSH
10.0000 mL | Freq: Once | INTRAVENOUS | Status: AC
  Administered 2021-03-12: 10 mL via INTRAVENOUS

## 2021-03-12 NOTE — Progress Notes (Signed)
Patients port flushed without difficulty.  No blood return noted, but pt tasted the saline and had no pain with flushing. No bruising or swelling noted at site.  Band aid applied.  VSS with discharge and left ambulatory with no s/s of distress noted

## 2021-03-12 NOTE — Progress Notes (Signed)
[facility] [address] [place] [address]   Patient Care Team: Kathyrn Drown, MD as PCP - General (Family Medicine) Brien Mates, RN as Oncology Nurse Navigator (Oncology) Donetta Potts, RN as Oncology Nurse Navigator (Oncology)  SUMMARY OF ONCOLOGIC HISTORY: Oncology History  Triple negative malignant neoplasm of breast ([place])  07/05/2020 Initial Diagnosis   Triple negative malignant neoplasm of breast ([facility])   08/01/2020 -  Chemotherapy   The patient had DOXOrubicin (ADRIAMYCIN) chemo injection 104 mg, 60 mg/m2 = 104 mg, Intravenous,  Once, 4 of 4 cycles Administration: 104 mg (08/01/2020), 104 mg (08/14/2020), 104 mg (09/01/2020), 104 mg (09/15/2020) palonosetron (ALOXI) injection 0.25 mg, 0.25 mg, Intravenous,  Once, 8 of 8 cycles Administration: 0.25 mg (08/01/2020), 0.25 mg (10/02/2020), 0.25 mg (08/14/2020), 0.25 mg (09/01/2020), 0.25 mg (09/15/2020), 0.25 mg (10/23/2020), 0.25 mg (11/13/2020), 0.25 mg (12/13/2020) pegfilgrastim (NEULASTA ONPRO KIT) injection 6 mg, 6 mg, Subcutaneous, Once, 1 of 1 cycle Administration: 6 mg (09/01/2020) pegfilgrastim-jmdb (FULPHILA) injection 6 mg, 6 mg, Subcutaneous,  Once, 3 of 3 cycles Administration: 6 mg (08/03/2020), 6 mg (08/16/2020), 6 mg (09/18/2020) CARBOplatin (PARAPLATIN) 240 mg in sodium chloride 0.9 % 250 mL chemo infusion, 240 mg (98.1 % of original dose 247 mg), Intravenous,  Once, 4 of 4 cycles Dose modification:   (original dose 247 mg, Cycle 5, Reason: Provider Judgment),   (original dose 247 mg, Cycle 6, Reason: Provider Judgment) Administration: 240 mg (10/02/2020), 240 mg (10/09/2020), 230 mg (10/23/2020), 180 mg (11/13/2020), 180 mg (12/13/2020), 230 mg (10/30/2020), 190 mg (11/06/2020), 180 mg (11/29/2020), 210 mg (10/16/2020), 180 mg (12/06/2020), 180 mg (12/20/2020) cyclophosphamide (CYTOXAN) 1,040 mg in sodium chloride 0.9 % 250 mL chemo infusion, 600 mg/m2 = 1,040 mg, Intravenous,  Once, 4 of 4 cycles Administration:  1,040 mg (08/01/2020), 1,040 mg (08/14/2020), 1,040 mg (09/01/2020), 1,040 mg (09/15/2020) PACLitaxel (TAXOL) 138 mg in sodium chloride 0.9 % 250 mL chemo infusion (</= $RemoveBefor'80mg'lQFZTpfBLGum$ /m2), 80 mg/m2 = 138 mg, Intravenous,  Once, 4 of 4 cycles Administration: 138 mg (10/02/2020), 138 mg (11/29/2020), 138 mg (10/09/2020), 138 mg (10/23/2020), 138 mg (10/30/2020), 138 mg (11/13/2020), 138 mg (12/13/2020), 138 mg (11/06/2020), 138 mg (10/16/2020), 138 mg (12/06/2020), 138 mg (12/20/2020) fosaprepitant (EMEND) 150 mg in sodium chloride 0.9 % 145 mL IVPB, 150 mg, Intravenous,  Once, 4 of 4 cycles Administration: 150 mg (08/01/2020), 150 mg (08/14/2020), 150 mg (09/01/2020), 150 mg (09/15/2020)  for chemotherapy treatment.    08/17/2020 Genetic Testing   Negative genetic testing:  No pathogenic variants detected on the [facility] CustomNext-Cancer + RNAinsight panel. A variant of uncertain significance (VUS) was detected in the MSH2 gene called c.1600C>T (p.R534C). The report date is 08/17/2020.  The CustomNext-Cancer+RNAinsight panel offered by [facility] includes sequencing and rearrangement analysis for up to 91 genes, which included the following 47 genes for Ms. Widjaja:  APC*, ATM*, AXIN2, BARD1, BMPR1A, BRCA1*, BRCA2*, BRIP1*, CDH1*, CDK4, CDKN2A, CHEK2*, DICER1, EPCAM, GREM1, HOXB13, MEN1, MLH1*, MSH2*, MSH3, MSH6*, MUTYH*, NBN, NF1*, NF2, NTHL1, PALB2*, PMS2*, POLD1, POLE, PTEN*, RAD51C*, RAD51D*, RECQL, RET, SDHA, SDHAF2, SDHB, SDHC, SDHD, SMAD4, SMARCA4, STK11, TP53*, TSC1, TSC2, and VHL.  DNA and RNA analyses performed for * genes.     Drug-induced neutropenia (HCC)    CHIEF COMPLIANT: Follow-up for left breast cancer   INTERVAL HISTORY: Ms. Elizabeth Graves is a 55 y.o. female here today for follow up of her left breast cancer. Her last visit was on 12/27/2020.   Today she  reports feeling well. She had her mastectomy on 02/09/2021 by Dr. Henreitta Leber and tolerated it well. She denies having any pain or swelling, though she notes  having numbness in her left axilla.   REVIEW OF SYSTEMS:   Review of Systems  Constitutional: Negative for appetite change and fatigue.  Neurological: Positive for numbness (under L axilla).  All other systems reviewed and are negative.   I have reviewed the past medical history, past surgical history, social history and family history with the patient and they are unchanged from previous note.   ALLERGIES:   has No Known Allergies.   MEDICATIONS:  Current Outpatient Medications  Medication Sig Dispense Refill  . capecitabine (XELODA) 500 MG tablet Take 3 tablets (1,500 mg total) by mouth 2 (two) times daily after a meal. 84 tablet 2  . albuterol (VENTOLIN HFA) 108 (90 Base) MCG/ACT inhaler TAKE 2 PUFFS BY MOUTH EVERY 6 HOURS AS NEEDED FOR WHEEZE OR SHORTNESS OF BREATH (Patient taking differently: Inhale 2 puffs into the lungs every 6 (six) hours as needed.) 8.5 Inhaler 0  . atorvastatin (LIPITOR) 20 MG tablet Take 1 tablet (20 mg total) by mouth daily. 90 tablet 1  . diphenhydrAMINE (BENADRYL) 25 mg capsule Take 25 mg by mouth every 6 (six) hours as needed for allergies. Allergies.    Marland Kitchen esomeprazole (NEXIUM) 20 MG capsule Take 20 mg by mouth daily before breakfast.    . ibuprofen (ADVIL,MOTRIN) 200 MG tablet Take 400-600 mg by mouth every 8 (eight) hours as needed for moderate pain (pain.).    Marland Kitchen lidocaine-prilocaine (EMLA) cream Apply a pea sized amount to port a cath site and cover with plastic wrap 1 hour prior to chemotherapy appointments 30 g 3  . lisinopril (ZESTRIL) 5 MG tablet Take 1 tablet (5 mg total) by mouth daily. 90 tablet 1  . loratadine (CLARITIN) 10 MG tablet Take 10 mg by mouth daily.    . naproxen sodium (ALEVE) 220 MG tablet Take 440 mg by mouth 3 (three) times daily as needed (pain.).    Marland Kitchen ondansetron (ZOFRAN-ODT) 4 MG disintegrating tablet Take 1 tablet (4 mg total) by mouth every 6 (six) hours as needed for nausea. (Patient not taking: No sig reported) 20 tablet 0   . oxyCODONE (OXY IR/ROXICODONE) 5 MG immediate release tablet Take 1 tablet (5 mg total) by mouth every 4 (four) hours as needed for severe pain or breakthrough pain. 15 tablet 0  . potassium chloride (KLOR-CON) 10 MEQ tablet TAKE 1 TABLET BY MOUTH EVERY DAY (Patient taking differently: Take 10 mEq by mouth daily.) 90 tablet 6  . prochlorperazine (COMPAZINE) 10 MG tablet Take 1 tablet (10 mg total) by mouth every 6 (six) hours as needed (Nausea or vomiting). 60 tablet 6  . sertraline (ZOLOFT) 50 MG tablet Take 1 tablet (50 mg total) by mouth daily. 90 tablet 1   No current facility-administered medications for this visit.     PHYSICAL EXAMINATION: Performance status (ECOG): 0 - Asymptomatic  Vitals:   03/12/21 1606  BP: (!) 150/93  Pulse: (!) 110  Resp: 16  Temp: 98.4 F (36.9 C)  SpO2: 98%   Wt Readings from Last 3 Encounters:  03/12/21 162 lb 9.6 oz (73.8 kg)  02/22/21 162 lb (73.5 kg)  02/15/21 161 lb (73 kg)   Physical Exam Vitals reviewed.  Constitutional:      Appearance: Normal appearance. She is obese.  Cardiovascular:     Rate and Rhythm: Normal rate and regular rhythm.  Pulses: Normal pulses.     Heart sounds: Normal heart sounds.  Pulmonary:     Effort: Pulmonary effort is normal.     Breath sounds: Normal breath sounds.  Chest:  Breasts:     Left: Absent. No swelling, mass or tenderness.    Neurological:     General: No focal deficit present.     Mental Status: She is alert and oriented to person, place, and time.  Psychiatric:        Mood and Affect: Mood normal.        Behavior: Behavior normal.     Breast Exam Chaperone: Milinda Antis, MD     LABORATORY DATA:  I have reviewed the data as listed CMP Latest Ref Rng & Units 02/10/2021 01/29/2021 12/26/2020  Glucose 70 - 99 mg/dL 169(H) 159(H) 261(H)  BUN 6 - 20 mg/dL $Remove'19 13 12  'OjbOPNU$ Creatinine 0.44 - 1.00 mg/dL 0.84 0.88 0.75  Sodium 135 - 145 mmol/L 135 134(L) 133(L)  Potassium 3.5 - 5.1  mmol/L 4.0 3.7 3.4(L)  Chloride 98 - 111 mmol/L 100 102 102  CO2 22 - 32 mmol/L $RemoveB'26 23 22  'lplLGaiH$ Calcium 8.9 - 10.3 mg/dL 9.0 9.4 8.5(L)  Total Protein 6.5 - 8.1 g/dL - - 6.6  Total Bilirubin 0.3 - 1.2 mg/dL - - 0.3  Alkaline Phos 38 - 126 U/L - - 75  AST 15 - 41 U/L - - 13(L)  ALT 0 - 44 U/L - - 16   Lab Results  Component Value Date   CAN153 17.6 07/05/2020   Lab Results  Component Value Date   WBC 9.3 03/12/2021   HGB 12.2 03/12/2021   HCT 38.0 03/12/2021   MCV 93.1 03/12/2021   PLT 255 03/12/2021   NEUTROABS 6.2 03/12/2021  No results found for: LDH   ASSESSMENT:  1. Triple negative left breast cancer (T2N1): -Patient identified left breast mass with left nipple retraction about 2 months ago. -Mammogram on 06/13/2020 showed mass in the retroareolar 12 o'clock position in the left breast. 2 suspicious left axillary lymph nodes, largest measuring 2 cm. -Breast ultrasound showed 2.7 x 1.6 x 2.2 cm mass in the 12:00 retroareolar position. Just lateral to the mass is a calcified hypoechoic nodule that corresponds to densely calcified degenerating fibroadenoma. -Left breast 12:00 biopsy showed invasive ductal carcinoma, grade 2/3. Left axillary lymph node biopsy was consistent with meta stasis. -ER negative, PR negative and Ki-67 15%. HER-2 equivocal by IHC. HER-2 FISH negative. -MRI of the breast on 07/19/2020 shows 4.2 cm enhancing mass in the subareolar left breast, enhancement extending to the level of the left nipple with associated nipple retraction and periareolar skin thickening. 6 morphologically abnormal level 1 left axillary lymph nodes. No evidence of malignancy in the right breast. -CT CAP on 07/14/2020 shows dominant left axillary nodes measuring 1.8 cm short axis. Small left subpectoral nodes measuring up to 7 mm short axis, suspicious. Small mediastinal lymph nodes measuring 7 to 9 mm, thought to be reactive. No other evidence of metastatic disease. -NeoadjuvantDose  dense ACstarted on 08/01/2020. -11 cycles of carboplatin and Taxol she completed on 12/20/2020. -Left mastectomy and axillary lymph node biopsy on 02/09/2021-pathology with 0.2 cm grade 2 IDC, margins negative, 0/12 lymph nodes involved, [id number].  2. Family history: -Father had gastroesophageal junction cancer. Maternal grandmother had throat cancer and paternal aunt had breast cancer. -[facility] genetics testing showed MSH2VUS.  3. High risk drug monitoring: -Echo on 07/12/2020 shows EF 60 to 65%.  PLAN:  1. T2N1 left breast TNBC: -We have reviewed pathology results from left mastectomy and lymph node biopsy. -It showed 0.2 cm residual carcinoma.  Physical examination showed well-healed left mastectomy site. -I have discussed CREATE-X trial which showed improvement in PFS and overall survival with Xeloda 1250 mg per metered square twice daily 2 weeks on 1 week off for 6-8 cycles particularly in people who did not get pCR after neoadjuvant chemotherapy.. -We discussed side effects in detail.  She is in agreement with proceeding with Xeloda.  She will be offered radiation therapy upon completion of Xeloda. -We will start her at Xeloda 1000 mg/m2 twice daily 2 weeks on 1 week off for 6 cycles. -We discussed side effects including but not limited to mucositis, cytopenias, diarrhea, hand-foot skin reaction, nausea among others. -I have sent prescription to specialty pharmacy at [facility].  She will call us when she gets the shipment and will start taking it.  I want to see her back in 2 weeks after start taking the pill with labs.  2. Family history: -Germline mutation testing was negative.  3. Hypokalemia: -Continue potassium 10 mEq daily.  Potassium today is 3.4.    Orders Placed This Encounter  Procedures  . CBC with Differential/Platelet    Standing Status:   Future    Standing Expiration Date:   03/12/2022    Order Specific Question:   Release to patient    Answer:    Immediate  . Comprehensive metabolic panel    Standing Status:   Future    Standing Expiration Date:   03/12/2022    Order Specific Question:   Release to patient    Answer:   Immediate  . Magnesium    Standing Status:   Future    Standing Expiration Date:   03/12/2022    Order Specific Question:   Release to patient    Answer:   Immediate   The patient has a good understanding of the overall plan. she agrees with it. she will call with any problems that may develop before the next visit here.    Derek Jack, MD [facility] [place] [phone]   I, Milinda Antis, am acting as a scribe for Dr. Sanda Linger.  I, Derek Jack MD, have reviewed the above documentation for accuracy and completeness, and I agree with the above.

## 2021-03-12 NOTE — Patient Instructions (Addendum)
[facility] at [facility] Discharge Instructions  You were seen today by Dr. Delton Coombes. He went over your recent results. Given the nature of your triple negative breast cancer and since there is some cancer still left in your body, you will be prescribed Xeloda to take twice daily for 2 weeks on with 1 week off. Take Compazine 30 minutes prior to taking Xeloda. After 6 cycles of Xeloda, you will be sent to radiation. Some side effects include diarrhea, nausea, skin rashes and hand-foot-skin syndrome (redness and peeling on palms of hands and feet). Dr. Delton Coombes will see you back in 3 weeks for labs and follow up.   Thank you for choosing [facility] at [facility] to provide your oncology and hematology care.  To afford each patient quality time with our provider, please arrive at least 15 minutes before your scheduled appointment time.   If you have a lab appointment with the [facility] please come in thru the Main Entrance and check in at the main information desk  You need to re-schedule your appointment should you arrive 10 or more minutes late.  We strive to give you quality time with our providers, and arriving late affects you and other patients whose appointments are after yours.  Also, if you no show three or more times for appointments you may be dismissed from the clinic at the providers discretion.     Again, thank you for choosing [facility].  Our hope is that these requests will decrease the amount of time that you wait before being seen by our physicians.       _____________________________________________________________  Should you have questions after your visit to [facility], please contact our office at [phone] between the hours of 8:00 a.m. and 4:30 p.m.  Voicemails left after 4:00 p.m. will not be returned until the following business day.  For prescription refill requests, have your pharmacy  contact our office and allow 72 hours.    Cancer Center Support Programs:   > Cancer Support Group  2nd Tuesday of the month 1pm-2pm, Journey Room

## 2021-03-13 ENCOUNTER — Telehealth (HOSPITAL_COMMUNITY): Payer: Self-pay | Admitting: Pharmacy Technician

## 2021-03-13 NOTE — Telephone Encounter (Signed)
Oral Oncology Patient Advocate Encounter   Received notification from [employer] that prior authorization for Xeloda is required.   PA submitted on CoverMyMeds Key [id number] Status is pending   Oral Oncology Clinic will continue to follow.  [place] Patient [place] Phone [phone] Fax [phone] 03/14/2021 3:58 PM

## 2021-03-15 NOTE — Telephone Encounter (Signed)
Oral Oncology Patient Advocate Encounter  Prior Authorization for Xeloda has been approved.    PA# [id number] Effective dates: 03/14/21 through 03/13/22  Patient must use [facility] per insurance.  Oral Oncology Clinic will continue to follow.   [facility] Patient Branch Phone [phone] Fax [phone] 03/15/2021 8:25 AM

## 2021-03-16 ENCOUNTER — Telehealth (HOSPITAL_COMMUNITY): Payer: Self-pay | Admitting: Pharmacist

## 2021-03-16 DIAGNOSIS — C50919 Malignant neoplasm of unspecified site of unspecified female breast: Secondary | ICD-10-CM

## 2021-03-16 MED ORDER — CAPECITABINE 500 MG PO TABS: 1000.0000 mg/m2 | ORAL_TABLET | Freq: Two times a day (BID) | ORAL | 2 refills | Status: DC

## 2021-03-16 NOTE — Telephone Encounter (Signed)
Oral Oncology Pharmacist Encounter  Received new prescription for Xeloda (capecitabine) for the treatment of T2N1 triple negative breast cancer, planned duration 6-8 cycles.  CMP from 03/12/21 assessed, no relevant lab abnormalities. Prescription dose and frequency assessed. MD starting patient on a reduced dose to 1000mg /m2 twice daily for 14 days on, 7 days off.  Current medication list in Epic reviewed, one relevant DDIs with capecitabine identified: - Esomeprazole: Proton Pump Inhibitors (PPI) may diminish the therapeutic effect of capecitabine, varying information on the clinical impact. Recommend evaluating the need for a PPI/acid suppression. If acid suppression is needed, attempt switching to a H2 antagonist (eg, famotidine) if possible.  Evaluated chart and no patient barriers to medication adherence identified.   Prescription has been e-scribed to the [facility] for benefits analysis and approval.  Oral Oncology Clinic will continue to follow for insurance authorization, copayment issues, initial counseling and start date.  Patient agreed to treatment on 03/12/21 per MD documentation.  Darl Pikes, PharmD, BCPS, BCOP, CPP Hematology/Oncology Clinical Pharmacist Practitioner [facility] [phone]  03/16/2021 3:44 PM

## 2021-03-19 NOTE — Telephone Encounter (Signed)
Per [employer], copay is $25.20 and they should be calling the patient today.  Spoke with patient and she said that was affordable and will expect their call.  [facility] Patient [place] Phone [phone] Fax [phone] 03/19/2021 12:07 PM

## 2021-03-19 NOTE — Telephone Encounter (Signed)
Oral Chemotherapy Pharmacist Encounter  Due to insurance restriction the medication could not be filled at [facility]. Prescription has been e-scribed to [facility] on 03/16/21.  Supportive information was faxed to [facility]. We will continue to follow medication access.   Called and notified patient, provided with new pharmacy number.  Darl Pikes, PharmD, BCPS, [employer] [employer] Hematology/Oncology Clinical Pharmacist [facility] [phone]  03/19/2021 8:45 AM

## 2021-03-23 NOTE — Telephone Encounter (Signed)
Patient received medication on 03/20/21.

## 2021-04-04 ENCOUNTER — Inpatient Hospital Stay (HOSPITAL_COMMUNITY): Payer: 59 | Attending: Hematology | Admitting: Hematology

## 2021-04-04 ENCOUNTER — Inpatient Hospital Stay (HOSPITAL_COMMUNITY): Payer: 59

## 2021-04-04 VITALS — BP 125/86 | HR 92 | Temp 97.2°F | Resp 18 | Wt 164.3 lb

## 2021-04-04 DIAGNOSIS — Z800 Family history of malignant neoplasm of digestive organs: Secondary | ICD-10-CM | POA: Diagnosis not present

## 2021-04-04 DIAGNOSIS — C50012 Malignant neoplasm of nipple and areola, left female breast: Secondary | ICD-10-CM | POA: Diagnosis present

## 2021-04-04 DIAGNOSIS — Z171 Estrogen receptor negative status [ER-]: Secondary | ICD-10-CM | POA: Diagnosis present

## 2021-04-04 DIAGNOSIS — Z8541 Personal history of malignant neoplasm of cervix uteri: Secondary | ICD-10-CM | POA: Insufficient documentation

## 2021-04-04 DIAGNOSIS — C50919 Malignant neoplasm of unspecified site of unspecified female breast: Secondary | ICD-10-CM | POA: Diagnosis not present

## 2021-04-04 DIAGNOSIS — Z801 Family history of malignant neoplasm of trachea, bronchus and lung: Secondary | ICD-10-CM | POA: Insufficient documentation

## 2021-04-04 DIAGNOSIS — Z9012 Acquired absence of left breast and nipple: Secondary | ICD-10-CM | POA: Insufficient documentation

## 2021-04-04 DIAGNOSIS — Z803 Family history of malignant neoplasm of breast: Secondary | ICD-10-CM | POA: Insufficient documentation

## 2021-04-04 DIAGNOSIS — C773 Secondary and unspecified malignant neoplasm of axilla and upper limb lymph nodes: Secondary | ICD-10-CM | POA: Insufficient documentation

## 2021-04-04 DIAGNOSIS — Z87891 Personal history of nicotine dependence: Secondary | ICD-10-CM | POA: Insufficient documentation

## 2021-04-04 LAB — CBC WITH DIFFERENTIAL/PLATELET
Abs Immature Granulocytes: 0.03 10*3/uL (ref 0.00–0.07)
Basophils Absolute: 0 10*3/uL (ref 0.0–0.1)
Basophils Relative: 0 %
Eosinophils Absolute: 0.1 10*3/uL (ref 0.0–0.5)
Eosinophils Relative: 2 %
HCT: 37.1 % (ref 36.0–46.0)
Hemoglobin: 12 g/dL (ref 12.0–15.0)
Immature Granulocytes: 0 %
Lymphocytes Relative: 31 %
Lymphs Abs: 2.3 10*3/uL (ref 0.7–4.0)
MCH: 29.5 pg (ref 26.0–34.0)
MCHC: 32.3 g/dL (ref 30.0–36.0)
MCV: 91.2 fL (ref 80.0–100.0)
Monocytes Absolute: 0.5 10*3/uL (ref 0.1–1.0)
Monocytes Relative: 7 %
Neutro Abs: 4.3 10*3/uL (ref 1.7–7.7)
Neutrophils Relative %: 60 %
Platelets: 258 10*3/uL (ref 150–400)
RBC: 4.07 MIL/uL (ref 3.87–5.11)
RDW: 13.8 % (ref 11.5–15.5)
WBC: 7.3 10*3/uL (ref 4.0–10.5)
nRBC: 0 % (ref 0.0–0.2)

## 2021-04-04 LAB — COMPREHENSIVE METABOLIC PANEL
ALT: 22 U/L (ref 0–44)
AST: 17 U/L (ref 15–41)
Albumin: 3.7 g/dL (ref 3.5–5.0)
Alkaline Phosphatase: 89 U/L (ref 38–126)
Anion gap: 11 (ref 5–15)
BUN: 12 mg/dL (ref 6–20)
CO2: 25 mmol/L (ref 22–32)
Calcium: 9.2 mg/dL (ref 8.9–10.3)
Chloride: 100 mmol/L (ref 98–111)
Creatinine, Ser: 1.07 mg/dL — ABNORMAL HIGH (ref 0.44–1.00)
GFR, Estimated: >60 mL/min (ref >60)
Glucose, Bld: 218 mg/dL — ABNORMAL HIGH (ref 70–99)
Potassium: 3.8 mmol/L (ref 3.5–5.1)
Sodium: 136 mmol/L (ref 135–145)
Total Bilirubin: 0.6 mg/dL (ref 0.3–1.2)
Total Protein: 7.2 g/dL (ref 6.5–8.1)

## 2021-04-04 LAB — MAGNESIUM: Magnesium: 1.5 mg/dL — ABNORMAL LOW (ref 1.7–2.4)

## 2021-04-04 MED ORDER — HEPARIN SOD (PORK) LOCK FLUSH 100 UNIT/ML IV SOLN
500.0000 [IU] | Freq: Once | INTRAVENOUS | Status: AC
  Administered 2021-04-04: 500 [IU] via INTRAVENOUS

## 2021-04-04 MED ORDER — MAGNESIUM OXIDE 400 (241.3 MG) MG PO TABS: 400.0000 mg | ORAL_TABLET | Freq: Two times a day (BID) | ORAL | 3 refills | Status: DC

## 2021-04-04 MED ORDER — SODIUM CHLORIDE 0.9% FLUSH
10.0000 mL | Freq: Once | INTRAVENOUS | Status: AC
  Administered 2021-04-04: 10 mL via INTRAVENOUS

## 2021-04-04 NOTE — Progress Notes (Signed)
Port flushed with good blood return noted. No bruising or swelling at site. Bandaid applied and patient discharged in satisfactory condition. VVS stable with no signs or symptoms of distressed noted. 

## 2021-04-04 NOTE — Progress Notes (Signed)
[facility] [address], [place] [address]   Patient Care Team: Babs Sciara, MD as PCP - General (Family Medicine) Therese Sarah, RN as Oncology Nurse Navigator (Oncology) Mickie Bail, RN as Oncology Nurse Navigator (Oncology)  SUMMARY OF ONCOLOGIC HISTORY: Oncology History  Triple negative malignant neoplasm of breast (HCC)  07/05/2020 Initial Diagnosis   Triple negative malignant neoplasm of breast (HCC)   08/01/2020 -  Chemotherapy   The patient had DOXOrubicin (ADRIAMYCIN) chemo injection 104 mg, 60 mg/m2 = 104 mg, Intravenous,  Once, 4 of 4 cycles Administration: 104 mg (08/01/2020), 104 mg (08/14/2020), 104 mg (09/01/2020), 104 mg (09/15/2020) palonosetron (ALOXI) injection 0.25 mg, 0.25 mg, Intravenous,  Once, 8 of 8 cycles Administration: 0.25 mg (08/01/2020), 0.25 mg (10/02/2020), 0.25 mg (08/14/2020), 0.25 mg (09/01/2020), 0.25 mg (09/15/2020), 0.25 mg (10/23/2020), 0.25 mg (11/13/2020), 0.25 mg (12/13/2020) pegfilgrastim (NEULASTA ONPRO KIT) injection 6 mg, 6 mg, Subcutaneous, Once, 1 of 1 cycle Administration: 6 mg (09/01/2020) pegfilgrastim-jmdb (FULPHILA) injection 6 mg, 6 mg, Subcutaneous,  Once, 3 of 3 cycles Administration: 6 mg (08/03/2020), 6 mg (08/16/2020), 6 mg (09/18/2020) CARBOplatin (PARAPLATIN) 240 mg in sodium chloride 0.9 % 250 mL chemo infusion, 240 mg (98.1 % of original dose 247 mg), Intravenous,  Once, 4 of 4 cycles Dose modification:   (original dose 247 mg, Cycle 5, Reason: Provider Judgment),   (original dose 247 mg, Cycle 6, Reason: Provider Judgment) Administration: 240 mg (10/02/2020), 240 mg (10/09/2020), 230 mg (10/23/2020), 180 mg (11/13/2020), 180 mg (12/13/2020), 230 mg (10/30/2020), 190 mg (11/06/2020), 180 mg (11/29/2020), 210 mg (10/16/2020), 180 mg (12/06/2020), 180 mg (12/20/2020) cyclophosphamide (CYTOXAN) 1,040 mg in sodium chloride 0.9 % 250 mL chemo infusion, 600 mg/m2 = 1,040 mg, Intravenous,  Once, 4 of 4 cycles Administration:  1,040 mg (08/01/2020), 1,040 mg (08/14/2020), 1,040 mg (09/01/2020), 1,040 mg (09/15/2020) PACLitaxel (TAXOL) 138 mg in sodium chloride 0.9 % 250 mL chemo infusion (</= 80mg /m2), 80 mg/m2 = 138 mg, Intravenous,  Once, 4 of 4 cycles Administration: 138 mg (10/02/2020), 138 mg (11/29/2020), 138 mg (10/09/2020), 138 mg (10/23/2020), 138 mg (10/30/2020), 138 mg (11/13/2020), 138 mg (12/13/2020), 138 mg (11/06/2020), 138 mg (10/16/2020), 138 mg (12/06/2020), 138 mg (12/20/2020) fosaprepitant (EMEND) 150 mg in sodium chloride 0.9 % 145 mL IVPB, 150 mg, Intravenous,  Once, 4 of 4 cycles Administration: 150 mg (08/01/2020), 150 mg (08/14/2020), 150 mg (09/01/2020), 150 mg (09/15/2020)  for chemotherapy treatment.    08/17/2020 Genetic Testing   Negative genetic testing:  No pathogenic variants detected on the [employer] CustomNext-Cancer + RNAinsight panel. A variant of uncertain significance (VUS) was detected in the MSH2 gene called c.1600C>T (p.R534C). The report date is 08/17/2020.  The CustomNext-Cancer+RNAinsight panel offered by 08/19/2020 includes sequencing and rearrangement analysis for up to 91 genes, which included the following 47 genes for Elizabeth Graves:  APC*, ATM*, AXIN2, BARD1, BMPR1A, BRCA1*, BRCA2*, BRIP1*, CDH1*, CDK4, CDKN2A, CHEK2*, DICER1, EPCAM, GREM1, HOXB13, MEN1, MLH1*, MSH2*, MSH3, MSH6*, MUTYH*, NBN, NF1*, NF2, NTHL1, PALB2*, PMS2*, POLD1, POLE, PTEN*, RAD51C*, RAD51D*, RECQL, RET, SDHA, SDHAF2, SDHB, SDHC, SDHD, SMAD4, SMARCA4, STK11, TP53*, TSC1, TSC2, and VHL.  DNA and RNA analyses performed for * genes.     Drug-induced neutropenia (HCC)    CHIEF COMPLIANT: Follow-up for left breast cancer   INTERVAL HISTORY: Elizabeth Graves is a 55 y.o. female here today for follow up of her left breast cancer. Her last visit was on 03/12/2021.   Today she  reports feeling well. She is taking Xeloda 1,500 mg BID and her last dose was on 04/05; she has not received the new bottle yet. She is keeping her skin  moist and denies having early satiety, sore throat, mouth sores, new pains, recent infections, F/C, night sweats, diarrhea or unexplained weight loss.  She is wondering if she can go back to work now.  REVIEW OF SYSTEMS:   Review of Systems  Constitutional: Positive for fatigue (75%). Negative for appetite change, chills, diaphoresis, fever and unexpected weight change.  HENT:   Negative for mouth sores and sore throat.   Gastrointestinal: Negative for diarrhea.  Musculoskeletal: Negative for arthralgias and myalgias.  Skin: Negative for rash.  All other systems reviewed and are negative.   I have reviewed the past medical history, past surgical history, social history and family history with the patient and they are unchanged from previous note.   ALLERGIES:   has No Known Allergies.   MEDICATIONS:  Current Outpatient Medications  Medication Sig Dispense Refill  . albuterol (VENTOLIN HFA) 108 (90 Base) MCG/ACT inhaler TAKE 2 PUFFS BY MOUTH EVERY 6 HOURS AS NEEDED FOR WHEEZE OR SHORTNESS OF BREATH (Patient taking differently: Inhale 2 puffs into the lungs every 6 (six) hours as needed.) 8.5 Inhaler 0  . atorvastatin (LIPITOR) 20 MG tablet Take 1 tablet (20 mg total) by mouth daily. 90 tablet 1  . capecitabine (XELODA) 500 MG tablet Take 3 tablets (1,500 mg total) by mouth 2 (two) times daily after a meal. Take for 14 days, then hold for 7 days. Repeat every 21 days. 84 tablet 2  . diphenhydrAMINE (BENADRYL) 25 mg capsule Take 25 mg by mouth every 6 (six) hours as needed for allergies. Allergies.    Marland Kitchen esomeprazole (NEXIUM) 20 MG capsule Take 20 mg by mouth daily before breakfast.    . ibuprofen (ADVIL,MOTRIN) 200 MG tablet Take 400-600 mg by mouth every 8 (eight) hours as needed for moderate pain (pain.).    Marland Kitchen lidocaine-prilocaine (EMLA) cream Apply a pea sized amount to port a cath site and cover with plastic wrap 1 hour prior to chemotherapy appointments 30 g 3  . lisinopril (ZESTRIL)  5 MG tablet Take 1 tablet (5 mg total) by mouth daily. 90 tablet 1  . loratadine (CLARITIN) 10 MG tablet Take 10 mg by mouth daily.    . naproxen sodium (ALEVE) 220 MG tablet Take 440 mg by mouth 3 (three) times daily as needed (pain.).    Marland Kitchen ondansetron (ZOFRAN-ODT) 4 MG disintegrating tablet Take 1 tablet (4 mg total) by mouth every 6 (six) hours as needed for nausea. 20 tablet 0  . potassium chloride (KLOR-CON) 10 MEQ tablet TAKE 1 TABLET BY MOUTH EVERY DAY (Patient taking differently: Take 10 mEq by mouth daily.) 90 tablet 6  . prochlorperazine (COMPAZINE) 10 MG tablet Take 1 tablet (10 mg total) by mouth every 6 (six) hours as needed (Nausea or vomiting). 60 tablet 6  . sertraline (ZOLOFT) 50 MG tablet Take 1 tablet (50 mg total) by mouth daily. 90 tablet 1   No current facility-administered medications for this visit.     PHYSICAL EXAMINATION: Performance status (ECOG): 0 - Asymptomatic  Vitals:   04/04/21 1423  BP: 125/86  Pulse: 92  Resp: 18  Temp: (!) 97.2 F (36.2 C)  SpO2: 98%   Wt Readings from Last 3 Encounters:  04/04/21 164 lb 4.8 oz (74.5 kg)  03/12/21 162 lb 9.6 oz (73.8 kg)  02/22/21 162  lb (73.5 kg)   Physical Exam Vitals reviewed.  Constitutional:      Appearance: Normal appearance. She is obese.  HENT:     Mouth/Throat:     Lips: No lesions.     Mouth: No oral lesions.     Dentition: No gum lesions.     Tongue: No lesions.     Palate: No mass.     Pharynx: No posterior oropharyngeal erythema.  Cardiovascular:     Rate and Rhythm: Normal rate and regular rhythm.     Pulses: Normal pulses.     Heart sounds: Normal heart sounds.  Pulmonary:     Effort: Pulmonary effort is normal.     Breath sounds: Normal breath sounds.  Chest:  Breasts:     Right: Normal. No swelling, bleeding, inverted nipple, mass, nipple discharge, skin change or tenderness.     Left: Absent. No mass, skin change or tenderness.    Musculoskeletal:     Right lower leg: No  edema.     Left lower leg: No edema.  Neurological:     General: No focal deficit present.     Mental Status: She is alert and oriented to person, place, and time.  Psychiatric:        Mood and Affect: Mood normal.        Behavior: Behavior normal.     Breast Exam Chaperone: Milinda Antis, MD     LABORATORY DATA:  I have reviewed the data as listed CMP Latest Ref Rng & Units 04/04/2021 03/12/2021 02/10/2021  Glucose 70 - 99 mg/dL 218(H) 143(H) 169(H)  BUN 6 - 20 mg/dL $Remove'12 13 19  'puegDmi$ Creatinine 0.44 - 1.00 mg/dL 1.07(H) 0.85 0.84  Sodium 135 - 145 mmol/L 136 134(L) 135  Potassium 3.5 - 5.1 mmol/L 3.8 3.4(L) 4.0  Chloride 98 - 111 mmol/L 100 102 100  CO2 22 - 32 mmol/L 25 20(L) 26  Calcium 8.9 - 10.3 mg/dL 9.2 9.4 9.0  Total Protein 6.5 - 8.1 g/dL 7.2 7.8 -  Total Bilirubin 0.3 - 1.2 mg/dL 0.6 0.5 -  Alkaline Phos 38 - 126 U/L 89 81 -  AST 15 - 41 U/L 17 23 -  ALT 0 - 44 U/L 22 24 -   Lab Results  Component Value Date   CAN153 17.6 07/05/2020   Lab Results  Component Value Date   WBC 7.3 04/04/2021   HGB 12.0 04/04/2021   HCT 37.1 04/04/2021   MCV 91.2 04/04/2021   PLT 258 04/04/2021   NEUTROABS 4.3 04/04/2021    ASSESSMENT:  1. Triple negative left breast cancer (T2N1): -Patient identified left breast mass with left nipple retraction about 2 months ago. -Mammogram on 06/13/2020 showed mass in the retroareolar 12 o'clock position in the left breast. 2 suspicious left axillary lymph nodes, largest measuring 2 cm. -Breast ultrasound showed 2.7 x 1.6 x 2.2 cm mass in the 12:00 retroareolar position. Just lateral to the mass is a calcified hypoechoic nodule that corresponds to densely calcified degenerating fibroadenoma. -Left breast 12:00 biopsy showed invasive ductal carcinoma, grade 2/3. Left axillary lymph node biopsy was consistent with meta stasis. -ER negative, PR negative and Ki-67 15%. HER-2 equivocal by IHC. HER-2 FISH negative. -MRI of the breast on 07/19/2020  shows 4.2 cm enhancing mass in the subareolar left breast, enhancement extending to the level of the left nipple with associated nipple retraction and periareolar skin thickening. 6 morphologically abnormal level 1 left axillary lymph nodes. No evidence of malignancy  in the right breast. -CT CAP on 07/14/2020 shows dominant left axillary nodes measuring 1.8 cm short axis. Small left subpectoral nodes measuring up to 7 mm short axis, suspicious. Small mediastinal lymph nodes measuring 7 to 9 mm, thought to be reactive. No other evidence of metastatic disease. -NeoadjuvantDose dense ACstarted on 08/01/2020. -11 cycles of carboplatin and Taxol she completed on 12/20/2020. -Left mastectomy and axillary lymph node biopsy on 02/09/2021-pathology with 0.2 cm grade 2 IDC, margins negative, 0/12 lymph nodes involved, [id number].  2. Family history: -Father had gastroesophageal junction cancer. Maternal grandmother had throat cancer and paternal aunt had breast cancer. -[facility] genetics testing showed MSH2VUS.  3. High risk drug monitoring: -Echo on 07/12/2020 shows EF 60 to 65%.   PLAN:  1. T2N1 left breast TNBC: -She had very small viable tumor left around 0.2 cm.  6 cycles of adjuvant Xeloda was recommended. -We have started adjuvant Xeloda therapy.  She started taking 3 tablets twice daily on 03/20/2021 and took last pill on 04/03/2021. -He denies any mucositis.  No hand-foot skin reaction noted on physical exam. -Reviewed labs which show slightly elevated creatinine 1.07.  Magnesium is low at 1.5.  Other LFTs are normal.  CBC was normal.  Glucose was elevated at 218. -She will start cycle 2 on 04/11/2021.  I will reevaluate her in 4 weeks with repeat labs and physical exam. -Because of elevated glucose, will check hemoglobin A1c at next visit.  2. Family history: -Germline mutation testing was negative.  3. Hypokalemia: -She is continuing potassium 10 mEq daily.  Potassium today is  3.8.  4.  Hypomagnesemia: -Magnesium is 1.5 today.  We will start her on magnesium twice daily.   Breast Cancer therapy associated bone loss: I have recommended calcium, Vitamin D and weight bearing exercises.   No orders of the defined types were placed in this encounter.  The patient has a good understanding of the overall plan. she agrees with it. she will call with any problems that may develop before the next visit here.    Derek Jack, MD [place] [phone]   I, Milinda Antis, am acting as a scribe for Dr. Sanda Linger.  I, Derek Jack MD, have reviewed the above documentation for accuracy and completeness, and I agree with the above.

## 2021-04-04 NOTE — Patient Instructions (Addendum)
[place] at [facility] Discharge Instructions  You were seen today by Dr. Delton Coombes. He went over your recent results. Start your next bottle of Xeloda on April 13. You will be prescribed magnesium to take 1 tablet twice daily; if you develop diarrhea, take 1 tablet once daily. Drink 1-2 liters of water daily to keep your kidneys flushed. Dr. Delton Coombes will see you back in 1 month for labs and follow up.   Thank you for choosing [place] at [facility] to provide your oncology and hematology care.  To afford each patient quality time with our provider, please arrive at least 15 minutes before your scheduled appointment time.   If you have a lab appointment with the [facility] please come in thru the Main Entrance and check in at the main information desk  You need to re-schedule your appointment should you arrive 10 or more minutes late.  We strive to give you quality time with our providers, and arriving late affects you and other patients whose appointments are after yours.  Also, if you no show three or more times for appointments you may be dismissed from the clinic at the providers discretion.     Again, thank you for choosing [facility]  Our hope is that these requests will decrease the amount of time that you wait before being seen by our physicians.       _____________________________________________________________  Should you have questions after your visit to [facility], please contact our office at [phone] between the hours of 8:00 a.m. and 4:30 p.m.  Voicemails left after 4:00 p.m. will not be returned until the following business day.  For prescription refill requests, have your pharmacy contact our office and allow 72 hours.    Cancer Center Support Programs:   > Cancer Support Group  2nd Tuesday of the month 1pm-2pm, Journey Room

## 2021-05-02 ENCOUNTER — Inpatient Hospital Stay (HOSPITAL_COMMUNITY): Payer: 59 | Attending: Hematology | Admitting: Hematology

## 2021-05-02 ENCOUNTER — Other Ambulatory Visit: Payer: Self-pay

## 2021-05-02 ENCOUNTER — Inpatient Hospital Stay (HOSPITAL_COMMUNITY): Payer: 59

## 2021-05-02 VITALS — BP 112/82 | HR 94 | Resp 16 | Wt 163.7 lb

## 2021-05-02 DIAGNOSIS — Z803 Family history of malignant neoplasm of breast: Secondary | ICD-10-CM | POA: Insufficient documentation

## 2021-05-02 DIAGNOSIS — C50919 Malignant neoplasm of unspecified site of unspecified female breast: Secondary | ICD-10-CM

## 2021-05-02 DIAGNOSIS — Z171 Estrogen receptor negative status [ER-]: Secondary | ICD-10-CM | POA: Insufficient documentation

## 2021-05-02 DIAGNOSIS — Z801 Family history of malignant neoplasm of trachea, bronchus and lung: Secondary | ICD-10-CM | POA: Diagnosis not present

## 2021-05-02 DIAGNOSIS — C773 Secondary and unspecified malignant neoplasm of axilla and upper limb lymph nodes: Secondary | ICD-10-CM | POA: Insufficient documentation

## 2021-05-02 DIAGNOSIS — Z87891 Personal history of nicotine dependence: Secondary | ICD-10-CM | POA: Insufficient documentation

## 2021-05-02 DIAGNOSIS — Z9012 Acquired absence of left breast and nipple: Secondary | ICD-10-CM | POA: Insufficient documentation

## 2021-05-02 DIAGNOSIS — Z800 Family history of malignant neoplasm of digestive organs: Secondary | ICD-10-CM | POA: Diagnosis not present

## 2021-05-02 DIAGNOSIS — C50012 Malignant neoplasm of nipple and areola, left female breast: Secondary | ICD-10-CM | POA: Insufficient documentation

## 2021-05-02 DIAGNOSIS — Z8541 Personal history of malignant neoplasm of cervix uteri: Secondary | ICD-10-CM | POA: Insufficient documentation

## 2021-05-02 LAB — COMPREHENSIVE METABOLIC PANEL
ALT: 31 U/L (ref 0–44)
AST: 27 U/L (ref 15–41)
Albumin: 4.2 g/dL (ref 3.5–5.0)
Alkaline Phosphatase: 94 U/L (ref 38–126)
Anion gap: 9 (ref 5–15)
BUN: 12 mg/dL (ref 6–20)
CO2: 26 mmol/L (ref 22–32)
Calcium: 9.5 mg/dL (ref 8.9–10.3)
Chloride: 102 mmol/L (ref 98–111)
Creatinine, Ser: 1 mg/dL (ref 0.44–1.00)
GFR, Estimated: >60 mL/min (ref >60)
Glucose, Bld: 124 mg/dL — ABNORMAL HIGH (ref 70–99)
Potassium: 3.7 mmol/L (ref 3.5–5.1)
Sodium: 137 mmol/L (ref 135–145)
Total Bilirubin: 0.5 mg/dL (ref 0.3–1.2)
Total Protein: 7.9 g/dL (ref 6.5–8.1)

## 2021-05-02 LAB — CBC WITH DIFFERENTIAL/PLATELET
Abs Immature Granulocytes: 0.03 10*3/uL (ref 0.00–0.07)
Basophils Absolute: 0 10*3/uL (ref 0.0–0.1)
Basophils Relative: 0 %
Eosinophils Absolute: 0.1 10*3/uL (ref 0.0–0.5)
Eosinophils Relative: 2 %
HCT: 41.1 % (ref 36.0–46.0)
Hemoglobin: 13.5 g/dL (ref 12.0–15.0)
Immature Granulocytes: 0 %
Lymphocytes Relative: 26 %
Lymphs Abs: 2.3 10*3/uL (ref 0.7–4.0)
MCH: 29.9 pg (ref 26.0–34.0)
MCHC: 32.8 g/dL (ref 30.0–36.0)
MCV: 90.9 fL (ref 80.0–100.0)
Monocytes Absolute: 0.7 10*3/uL (ref 0.1–1.0)
Monocytes Relative: 8 %
Neutro Abs: 5.8 10*3/uL (ref 1.7–7.7)
Neutrophils Relative %: 64 %
Platelets: 239 10*3/uL (ref 150–400)
RBC: 4.52 MIL/uL (ref 3.87–5.11)
RDW: 15.3 % (ref 11.5–15.5)
WBC: 9.1 10*3/uL (ref 4.0–10.5)
nRBC: 0 % (ref 0.0–0.2)

## 2021-05-02 LAB — HEMOGLOBIN A1C
Hgb A1c MFr Bld: 8.7 % — ABNORMAL HIGH (ref 4.8–5.6)
Mean Plasma Glucose: 202.99 mg/dL

## 2021-05-02 LAB — MAGNESIUM: Magnesium: 1.9 mg/dL (ref 1.7–2.4)

## 2021-05-02 NOTE — Progress Notes (Signed)
[place] [address] [place] [address]   Patient Care Team: Kathyrn Drown, MD as PCP - General (Family Medicine) Brien Mates, RN as Oncology Nurse Navigator (Oncology) Donetta Potts, RN as Oncology Nurse Navigator (Oncology)  SUMMARY OF ONCOLOGIC HISTORY: Oncology History  Triple negative malignant neoplasm of breast ([facility])  07/05/2020 Initial Diagnosis   Triple negative malignant neoplasm of breast ([facility])   08/01/2020 -  Chemotherapy   The patient had DOXOrubicin (ADRIAMYCIN) chemo injection 104 mg, 60 mg/m2 = 104 mg, Intravenous,  Once, 4 of 4 cycles Administration: 104 mg (08/01/2020), 104 mg (08/14/2020), 104 mg (09/01/2020), 104 mg (09/15/2020) palonosetron (ALOXI) injection 0.25 mg, 0.25 mg, Intravenous,  Once, 8 of 8 cycles Administration: 0.25 mg (08/01/2020), 0.25 mg (10/02/2020), 0.25 mg (08/14/2020), 0.25 mg (09/01/2020), 0.25 mg (09/15/2020), 0.25 mg (10/23/2020), 0.25 mg (11/13/2020), 0.25 mg (12/13/2020) pegfilgrastim (NEULASTA ONPRO KIT) injection 6 mg, 6 mg, Subcutaneous, Once, 1 of 1 cycle Administration: 6 mg (09/01/2020) pegfilgrastim-jmdb (FULPHILA) injection 6 mg, 6 mg, Subcutaneous,  Once, 3 of 3 cycles Administration: 6 mg (08/03/2020), 6 mg (08/16/2020), 6 mg (09/18/2020) CARBOplatin (PARAPLATIN) 240 mg in sodium chloride 0.9 % 250 mL chemo infusion, 240 mg (98.1 % of original dose 247 mg), Intravenous,  Once, 4 of 4 cycles Dose modification:   (original dose 247 mg, Cycle 5, Reason: Provider Judgment),   (original dose 247 mg, Cycle 6, Reason: Provider Judgment) Administration: 240 mg (10/02/2020), 240 mg (10/09/2020), 230 mg (10/23/2020), 180 mg (11/13/2020), 180 mg (12/13/2020), 230 mg (10/30/2020), 190 mg (11/06/2020), 180 mg (11/29/2020), 210 mg (10/16/2020), 180 mg (12/06/2020), 180 mg (12/20/2020) cyclophosphamide (CYTOXAN) 1,040 mg in sodium chloride 0.9 % 250 mL chemo infusion, 600 mg/m2 = 1,040 mg, Intravenous,  Once, 4 of 4 cycles Administration:  1,040 mg (08/01/2020), 1,040 mg (08/14/2020), 1,040 mg (09/01/2020), 1,040 mg (09/15/2020) PACLitaxel (TAXOL) 138 mg in sodium chloride 0.9 % 250 mL chemo infusion (</= $RemoveBefor'80mg'BURxIzWmpBmK$ /m2), 80 mg/m2 = 138 mg, Intravenous,  Once, 4 of 4 cycles Administration: 138 mg (10/02/2020), 138 mg (11/29/2020), 138 mg (10/09/2020), 138 mg (10/23/2020), 138 mg (10/30/2020), 138 mg (11/13/2020), 138 mg (12/13/2020), 138 mg (11/06/2020), 138 mg (10/16/2020), 138 mg (12/06/2020), 138 mg (12/20/2020) fosaprepitant (EMEND) 150 mg in sodium chloride 0.9 % 145 mL IVPB, 150 mg, Intravenous,  Once, 4 of 4 cycles Administration: 150 mg (08/01/2020), 150 mg (08/14/2020), 150 mg (09/01/2020), 150 mg (09/15/2020)  for chemotherapy treatment.    08/17/2020 Genetic Testing   Negative genetic testing:  No pathogenic variants detected on the [facility] CustomNext-Cancer + RNAinsight panel. A variant of uncertain significance (VUS) was detected in the MSH2 gene called c.1600C>T (p.R534C). The report date is 08/17/2020.  The CustomNext-Cancer+RNAinsight panel offered by [facility] includes sequencing and rearrangement analysis for up to 91 genes, which included the following 47 genes for Ms. Goel:  APC*, ATM*, AXIN2, BARD1, BMPR1A, BRCA1*, BRCA2*, BRIP1*, CDH1*, CDK4, CDKN2A, CHEK2*, DICER1, EPCAM, GREM1, HOXB13, MEN1, MLH1*, MSH2*, MSH3, MSH6*, MUTYH*, NBN, NF1*, NF2, NTHL1, PALB2*, PMS2*, POLD1, POLE, PTEN*, RAD51C*, RAD51D*, RECQL, RET, SDHA, SDHAF2, SDHB, SDHC, SDHD, SMAD4, SMARCA4, STK11, TP53*, TSC1, TSC2, and VHL.  DNA and RNA analyses performed for * genes.     Drug-induced neutropenia (HCC)    CHIEF COMPLIANT: Follow-up for left breast cancer   INTERVAL HISTORY: Ms. Elizabeth Graves is a 55 y.o. female here today for follow up of her left breast cancer. Her last visit was on 04/04/2021.   Today she  reports feeling well. She is taking Xeloda and denies having any mouth sores, skin rashes or brittle nails. She began her third cycle today with 1,500 mg  BID. She is taking potassium and magnesium, but she is not taking calcium or vitamin D.  She is going back to work on 05/05.   REVIEW OF SYSTEMS:   Review of Systems  Constitutional: Negative for appetite change and fatigue.  HENT:   Negative for mouth sores.   Gastrointestinal: Positive for diarrhea.  Skin: Negative for rash.  All other systems reviewed and are negative.   I have reviewed the past medical history, past surgical history, social history and family history with the patient and they are unchanged from previous note.   ALLERGIES:   has No Known Allergies.   MEDICATIONS:  Current Outpatient Medications  Medication Sig Dispense Refill  . albuterol (VENTOLIN HFA) 108 (90 Base) MCG/ACT inhaler TAKE 2 PUFFS BY MOUTH EVERY 6 HOURS AS NEEDED FOR WHEEZE OR SHORTNESS OF BREATH (Patient taking differently: Inhale 2 puffs into the lungs every 6 (six) hours as needed.) 8.5 Inhaler 0  . atorvastatin (LIPITOR) 20 MG tablet Take 1 tablet (20 mg total) by mouth daily. 90 tablet 1  . capecitabine (XELODA) 500 MG tablet Take 3 tablets (1,500 mg total) by mouth 2 (two) times daily after a meal. Take for 14 days, then hold for 7 days. Repeat every 21 days. 84 tablet 2  . diphenhydrAMINE (BENADRYL) 25 mg capsule Take 25 mg by mouth every 6 (six) hours as needed for allergies. Allergies.    Marland Kitchen esomeprazole (NEXIUM) 20 MG capsule Take 20 mg by mouth daily before breakfast.    . ibuprofen (ADVIL,MOTRIN) 200 MG tablet Take 400-600 mg by mouth every 8 (eight) hours as needed for moderate pain (pain.).    Marland Kitchen lidocaine-prilocaine (EMLA) cream Apply a pea sized amount to port a cath site and cover with plastic wrap 1 hour prior to chemotherapy appointments 30 g 3  . lisinopril (ZESTRIL) 5 MG tablet Take 1 tablet (5 mg total) by mouth daily. 90 tablet 1  . loratadine (CLARITIN) 10 MG tablet Take 10 mg by mouth daily.    . magnesium oxide (MAG-OX) 400 (241.3 Mg) MG tablet Take 1 tablet (400 mg total) by  mouth 2 (two) times daily. 60 tablet 3  . naproxen sodium (ALEVE) 220 MG tablet Take 440 mg by mouth 3 (three) times daily as needed (pain.).    Marland Kitchen ondansetron (ZOFRAN-ODT) 4 MG disintegrating tablet Take 1 tablet (4 mg total) by mouth every 6 (six) hours as needed for nausea. 20 tablet 0  . potassium chloride (KLOR-CON) 10 MEQ tablet TAKE 1 TABLET BY MOUTH EVERY DAY (Patient taking differently: Take 10 mEq by mouth daily.) 90 tablet 6  . prochlorperazine (COMPAZINE) 10 MG tablet Take 1 tablet (10 mg total) by mouth every 6 (six) hours as needed (Nausea or vomiting). 60 tablet 6  . sertraline (ZOLOFT) 50 MG tablet Take 1 tablet (50 mg total) by mouth daily. 90 tablet 1   No current facility-administered medications for this visit.     PHYSICAL EXAMINATION: Performance status (ECOG): 0 - Asymptomatic  Vitals:   05/02/21 1608  BP: 112/82  Pulse: 94  Resp: 16  SpO2: 98%   Wt Readings from Last 3 Encounters:  05/02/21 163 lb 11.2 oz (74.3 kg)  04/04/21 164 lb 4.8 oz (74.5 kg)  03/12/21 162 lb 9.6 oz (73.8 kg)   Physical Exam Vitals reviewed.  Constitutional:      Appearance: Normal appearance. She is obese.  Cardiovascular:     Rate and Rhythm: Normal rate and regular rhythm.     Pulses: Normal pulses.     Heart sounds: Normal heart sounds.  Pulmonary:     Effort: Pulmonary effort is normal.     Breath sounds: Normal breath sounds.  Chest:  Breasts:     Right: No swelling, bleeding, inverted nipple, mass, nipple discharge, skin change, tenderness or supraclavicular adenopathy.     Left: No swelling, bleeding, inverted nipple, mass, nipple discharge, skin change, tenderness or supraclavicular adenopathy.    Abdominal:     Palpations: Abdomen is soft. There is no hepatomegaly or mass.     Tenderness: There is no abdominal tenderness.     Hernia: No hernia is present.  Musculoskeletal:     Right lower leg: No edema.     Left lower leg: No edema.  Lymphadenopathy:     Upper  Body:     Right upper body: No supraclavicular adenopathy.     Left upper body: No supraclavicular adenopathy.  Skin:    Findings: No rash.  Neurological:     General: No focal deficit present.     Mental Status: She is alert and oriented to person, place, and time.  Psychiatric:        Mood and Affect: Mood normal.        Behavior: Behavior normal.     Breast Exam Chaperone: Milinda Antis, MD     LABORATORY DATA:  I have reviewed the data as listed CMP Latest Ref Rng & Units 05/02/2021 04/04/2021 03/12/2021  Glucose 70 - 99 mg/dL 124(H) 218(H) 143(H)  BUN 6 - 20 mg/dL $Remove'12 12 13  'TYsJrIk$ Creatinine 0.44 - 1.00 mg/dL 1.00 1.07(H) 0.85  Sodium 135 - 145 mmol/L 137 136 134(L)  Potassium 3.5 - 5.1 mmol/L 3.7 3.8 3.4(L)  Chloride 98 - 111 mmol/L 102 100 102  CO2 22 - 32 mmol/L 26 25 20(L)  Calcium 8.9 - 10.3 mg/dL 9.5 9.2 9.4  Total Protein 6.5 - 8.1 g/dL 7.9 7.2 7.8  Total Bilirubin 0.3 - 1.2 mg/dL 0.5 0.6 0.5  Alkaline Phos 38 - 126 U/L 94 89 81  AST 15 - 41 U/L $Remo'27 17 23  'jmjnk$ ALT 0 - 44 U/L $Remo'31 22 24   'lyAhM$ Lab Results  Component Value Date   CAN153 17.6 07/05/2020   Lab Results  Component Value Date   WBC 9.1 05/02/2021   HGB 13.5 05/02/2021   HCT 41.1 05/02/2021   MCV 90.9 05/02/2021   PLT 239 05/02/2021   NEUTROABS 5.8 05/02/2021    ASSESSMENT:  1. Triple negative left breast cancer (T2N1): -Patient identified left breast mass with left nipple retraction about 2 months ago. -Mammogram on 06/13/2020 showed mass in the retroareolar 12 o'clock position in the left breast. 2 suspicious left axillary lymph nodes, largest measuring 2 cm. -Breast ultrasound showed 2.7 x 1.6 x 2.2 cm mass in the 12:00 retroareolar position. Just lateral to the mass is a calcified hypoechoic nodule that corresponds to densely calcified degenerating fibroadenoma. -Left breast 12:00 biopsy showed invasive ductal carcinoma, grade 2/3. Left axillary lymph node biopsy was consistent with meta stasis. -ER  negative, PR negative and Ki-67 15%. HER-2 equivocal by IHC. HER-2 FISH negative. -MRI of the breast on 07/19/2020 shows 4.2 cm enhancing mass in the subareolar left breast, enhancement extending to the level of the left nipple with associated nipple retraction and  periareolar skin thickening. 6 morphologically abnormal level 1 left axillary lymph nodes. No evidence of malignancy in the right breast. -CT CAP on 07/14/2020 shows dominant left axillary nodes measuring 1.8 cm short axis. Small left subpectoral nodes measuring up to 7 mm short axis, suspicious. Small mediastinal lymph nodes measuring 7 to 9 mm, thought to be reactive. No other evidence of metastatic disease. -NeoadjuvantDose dense ACstarted on 08/01/2020. -11 cycles of carboplatin and Taxolshecompleted on 12/20/2020. -Left mastectomy and axillary lymph node biopsy on 02/09/2021-pathology with 0.2 cm grade 2 IDC, margins negative, 0/12 lymph nodes involved, [id number]. - Adjuvant Xeloda 3 tablets twice daily 2 weeks on 1 week off started on 03/20/2021.  Cycle 2 on 04/11/2021.  2. Family history: -Father had gastroesophageal junction cancer. Maternal grandmother had throat cancer and paternal aunt had breast cancer. -[facility] genetics testing showed MSH2VUS.  3. High risk drug monitoring: -Echo on 07/12/2020 shows EF 60 to 65%.   PLAN:  1. T2N1 left breast TNBC: -She started cycle 2 capecitabine 3 tablets twice daily on 04/11/2021. - No clinical signs of mucositis or hand-foot skin reaction. - Labs reviewed from today shows normal LFTs.  CBC was grossly normal. - We checked her hemoglobin A1c as her glucose levels were high. - Hemoglobin A1c was elevated at 8.7. - We will make a referral to Dr. Wolfgang Phoenix for management of diabetes. - She will start cycle 3 today.  Her counts are adequate. - RTC 4 weeks for follow-up.  2. Family history: -Germline mutation testing was negative.  3. Hypokalemia: -Continue potassium 10  mEq daily.  Potassium is 3.7.  4.  Hypomagnesemia: -Continue magnesium twice daily.  Magnesium is 1.9 today.   Breast Cancer therapy associated bone loss: I have recommended calcium, Vitamin D and weight bearing exercises.  Orders placed this encounter:  Orders Placed This Encounter  Procedures  . CBC with Differential/Platelet  . Comprehensive metabolic panel  . Magnesium    The patient has a good understanding of the overall plan. She agrees with it. She will call with any problems that may develop before the next visit here.  Derek Jack, MD [facility] [phone]   I, Milinda Antis, am acting as a scribe for Dr. Sanda Linger.  I, Derek Jack MD, have reviewed the above documentation for accuracy and completeness, and I agree with the above.

## 2021-05-02 NOTE — Patient Instructions (Addendum)
[place] at [facility] Discharge Instructions  You were seen today by Dr. Delton Coombes. He went over your recent results. You may start your next cycle of Xeloda today. Purchase calcium 1,000 mg and vitamin D 1,000 units combination tablet over the counter and take 1 tablet daily. Dr. Delton Coombes will see you back in 1 month for labs and follow up.   Thank you for choosing [place] at [facility] to provide your oncology and hematology care.  To afford each patient quality time with our provider, please arrive at least 15 minutes before your scheduled appointment time.   If you have a lab appointment with the [facility] please come in thru the Main Entrance and check in at the main information desk  You need to re-schedule your appointment should you arrive 10 or more minutes late.  We strive to give you quality time with our providers, and arriving late affects you and other patients whose appointments are after yours.  Also, if you no show three or more times for appointments you may be dismissed from the clinic at the providers discretion.     Again, thank you for choosing [facility].  Our hope is that these requests will decrease the amount of time that you wait before being seen by our physicians.       _____________________________________________________________  Should you have questions after your visit to [facility], please contact our office at [phone] between the hours of 8:00 a.m. and 4:30 p.m.  Voicemails left after 4:00 p.m. will not be returned until the following business day.  For prescription refill requests, have your pharmacy contact our office and allow 72 hours.    Cancer Center Support Programs:   > Cancer Support Group  2nd Tuesday of the month 1pm-2pm, Journey Room

## 2021-05-10 ENCOUNTER — Telehealth (HOSPITAL_COMMUNITY): Payer: Self-pay | Admitting: *Deleted

## 2021-05-10 NOTE — Telephone Encounter (Addendum)
-----   Message from Derek Jack, MD sent at 05/04/2021  2:46 PM EDT ----- Please call this patient and inform her that her hemoglobin A1c is elevated at 8.7.  She needs to follow-up with Dr. Wolfgang Phoenix, her PMD for diabetes management.  After multiple attempts to reach patient, a VM was left with information.

## 2021-05-30 ENCOUNTER — Other Ambulatory Visit (HOSPITAL_COMMUNITY): Payer: Self-pay | Admitting: *Deleted

## 2021-05-30 DIAGNOSIS — C50919 Malignant neoplasm of unspecified site of unspecified female breast: Secondary | ICD-10-CM

## 2021-05-30 MED ORDER — CAPECITABINE 500 MG PO TABS: 1000.0000 mg/m2 | ORAL_TABLET | Freq: Two times a day (BID) | ORAL | 2 refills | Status: DC

## 2021-06-04 ENCOUNTER — Ambulatory Visit (HOSPITAL_COMMUNITY): Payer: 59 | Admitting: Hematology

## 2021-06-04 ENCOUNTER — Other Ambulatory Visit (HOSPITAL_COMMUNITY): Payer: 59

## 2021-06-04 ENCOUNTER — Ambulatory Visit (HOSPITAL_COMMUNITY): Payer: 59

## 2021-06-04 ENCOUNTER — Inpatient Hospital Stay (HOSPITAL_COMMUNITY): Payer: 59

## 2021-06-04 NOTE — Progress Notes (Signed)
[place]   [phone] [phone]   Clinic Follow up Note   Patient Care Team: Kathyrn Drown, MD as PCP - General (Family Medicine) Brien Mates, RN as Oncology Nurse Navigator (Oncology) Donetta Potts, RN as Oncology Nurse Navigator (Oncology) 06/05/2021  CHIEF COMPLAINT: Follow-up for left breast cancer  SUMMARY OF ONCOLOGIC HISTORY: Oncology History Overview Note  Cancer Staging Triple negative malignant neoplasm of breast [facility]) Staging form: Breast, AJCC 8th Edition - Clinical stage from 06/20/2020: Stage IIIB (cT2, cN1, cM0, G2, ER-, PR-, HER2-) - Signed by Truitt Merle, MD on 06/05/2021 Stage prefix: Initial diagnosis Histologic grading system: 3 grade system - Pathologic stage from 02/09/2021: No Stage Recommended (ypT1a, pN0, cM0, G2, ER-, PR-, HER2-) - Signed by Truitt Merle, MD on 06/05/2021 Stage prefix: Post-therapy Response to neoadjuvant therapy: Partial response Histologic grading system: 3 grade system    Triple negative malignant neoplasm of breast ([place])  06/20/2020 Cancer Staging   Staging form: Breast, AJCC 8th Edition - Clinical stage from 06/20/2020: Stage IIIB (cT2, cN1, cM0, G2, ER-, PR-, HER2-) - Signed by Truitt Merle, MD on 06/05/2021 Stage prefix: Initial diagnosis Histologic grading system: 3 grade system   07/05/2020 Initial Diagnosis   Triple negative malignant neoplasm of breast ([place])   08/01/2020 -  Chemotherapy   The patient had DOXOrubicin (ADRIAMYCIN) chemo injection 104 mg, 60 mg/m2 = 104 mg, Intravenous,  Once, 4 of 4 cycles Administration: 104 mg (08/01/2020), 104 mg (08/14/2020), 104 mg (09/01/2020), 104 mg (09/15/2020) palonosetron (ALOXI) injection 0.25 mg, 0.25 mg, Intravenous,  Once, 8 of 8 cycles Administration: 0.25 mg (08/01/2020), 0.25 mg (10/02/2020), 0.25 mg (08/14/2020), 0.25 mg (09/01/2020), 0.25 mg (09/15/2020), 0.25 mg (10/23/2020), 0.25 mg (11/13/2020), 0.25 mg (12/13/2020) pegfilgrastim (NEULASTA ONPRO KIT)  injection 6 mg, 6 mg, Subcutaneous, Once, 1 of 1 cycle Administration: 6 mg (09/01/2020) pegfilgrastim-jmdb (FULPHILA) injection 6 mg, 6 mg, Subcutaneous,  Once, 3 of 3 cycles Administration: 6 mg (08/03/2020), 6 mg (08/16/2020), 6 mg (09/18/2020) CARBOplatin (PARAPLATIN) 240 mg in sodium chloride 0.9 % 250 mL chemo infusion, 240 mg (98.1 % of original dose 247 mg), Intravenous,  Once, 4 of 4 cycles Dose modification:   (original dose 247 mg, Cycle 5, Reason: Provider Judgment),   (original dose 247 mg, Cycle 6, Reason: Provider Judgment) Administration: 240 mg (10/02/2020), 240 mg (10/09/2020), 230 mg (10/23/2020), 180 mg (11/13/2020), 180 mg (12/13/2020), 230 mg (10/30/2020), 190 mg (11/06/2020), 180 mg (11/29/2020), 210 mg (10/16/2020), 180 mg (12/06/2020), 180 mg (12/20/2020) cyclophosphamide (CYTOXAN) 1,040 mg in sodium chloride 0.9 % 250 mL chemo infusion, 600 mg/m2 = 1,040 mg, Intravenous,  Once, 4 of 4 cycles Administration: 1,040 mg (08/01/2020), 1,040 mg (08/14/2020), 1,040 mg (09/01/2020), 1,040 mg (09/15/2020) PACLitaxel (TAXOL) 138 mg in sodium chloride 0.9 % 250 mL chemo infusion (</= $RemoveBefor'80mg'aefDwNyBhnXE$ /m2), 80 mg/m2 = 138 mg, Intravenous,  Once, 4 of 4 cycles Administration: 138 mg (10/02/2020), 138 mg (11/29/2020), 138 mg (10/09/2020), 138 mg (10/23/2020), 138 mg (10/30/2020), 138 mg (11/13/2020), 138 mg (12/13/2020), 138 mg (11/06/2020), 138 mg (10/16/2020), 138 mg (12/06/2020), 138 mg (12/20/2020) fosaprepitant (EMEND) 150 mg in sodium chloride 0.9 % 145 mL IVPB, 150 mg, Intravenous,  Once, 4 of 4 cycles Administration: 150 mg (08/01/2020), 150 mg (08/14/2020), 150 mg (09/01/2020), 150 mg (09/15/2020)  for chemotherapy treatment.    08/17/2020 Genetic Testing   Negative genetic testing:  No pathogenic variants detected on the [employer] CustomNext-Cancer + RNAinsight panel. A variant of uncertain significance (VUS) was  detected in the MSH2 gene called c.1600C>T (p.R534C). The report date is 08/17/2020.  The  CustomNext-Cancer+RNAinsight panel offered by [employer] includes sequencing and rearrangement analysis for up to 91 genes, which included the following 47 genes for Ms. Burtch:  APC*, ATM*, AXIN2, BARD1, BMPR1A, BRCA1*, BRCA2*, BRIP1*, CDH1*, CDK4, CDKN2A, CHEK2*, DICER1, EPCAM, GREM1, HOXB13, MEN1, MLH1*, MSH2*, MSH3, MSH6*, MUTYH*, NBN, NF1*, NF2, NTHL1, PALB2*, PMS2*, POLD1, POLE, PTEN*, RAD51C*, RAD51D*, RECQL, RET, SDHA, SDHAF2, SDHB, SDHC, SDHD, SMAD4, SMARCA4, STK11, TP53*, TSC1, TSC2, and VHL.  DNA and RNA analyses performed for * genes.     02/09/2021 Cancer Staging   Staging form: Breast, AJCC 8th Edition - Pathologic stage from 02/09/2021: No Stage Recommended (ypT1a, pN0, cM0, G2, ER-, PR-, HER2-) - Signed by Malachy Mood, MD on 06/05/2021 Stage prefix: Post-therapy Response to neoadjuvant therapy: Partial response Histologic grading system: 3 grade system   06/19/2021 -  Chemotherapy    Patient is on Treatment Plan: BREAST DOSE DENSE AC Q14D / CARBOPLATIN D1 + PACLITAXEL D1,8,15 Q21D   Patient is on Antibody Plan: HEAD/NECK PEMBROLIZUMAB Q21D    Drug-induced neutropenia (HCC)    CURRENT THERAPY: Adjuvant Xeloda  INTERVAL HISTORY: Ms Barbar returns for follow-up.  She presents to the clinic by herself. She is finishing cycle 4 Xeloda today. No issue. Mild dry skin in palm, no peeling  Energy level is good. She has good appetite, no weight loss  No other symptoms. She is back to work full time. She is able to tolerating full time job   REVIEW OF SYSTEMS:   Constitutional: Denies fevers, chills or abnormal weight loss Eyes: Denies blurriness of vision Ears, nose, mouth, throat, and face: Denies mucositis or sore throat Respiratory: Denies cough, dyspnea or wheezes Cardiovascular: Denies palpitation, chest discomfort or lower extremity swelling Gastrointestinal:  Denies nausea, heartburn or change in bowel habits Skin: Denies abnormal skin rashes Lymphatics: Denies new  lymphadenopathy or easy bruising Neurological:Denies numbness, tingling or new weaknesses Behavioral/Psych: Mood is stable, no new changes  All other systems were reviewed with the patient and are negative.  MEDICAL HISTORY:  Past Medical History:  Diagnosis Date  . Asthma   . Breast cancer (HCC)   . Cancer (HCC)    cervical - 20 yrs ago  . Carpal tunnel syndrome   . Depression   . Family history of breast cancer   . Family history of lung cancer   . Hypertension   . Hypokalemia    tx w/ k-Dur  . Port-A-Cath in place 07/24/2020  . Seasonal allergies    tx with benadryl prn  . SVD (spontaneous vaginal delivery)    x 2    SURGICAL HISTORY: Past Surgical History:  Procedure Laterality Date  . ABDOMINAL HYSTERECTOMY    . CO2 Laser of Cervix     for cervical cancer 20 yrs ago  . DILATION AND CURETTAGE OF UTERUS  12/2010   polyp removed  . DILATION AND CURETTAGE OF UTERUS     x 3 for MAB  . LAPAROSCOPIC ASSISTED VAGINAL HYSTERECTOMY  06/09/2012   Procedure: LAPAROSCOPIC ASSISTED VAGINAL HYSTERECTOMY;  Surgeon: Miguel Aschoff, MD;  Location: [facility];  Service: Gynecology;  Laterality: N/A;  . MASTECTOMY MODIFIED RADICAL Left 02/09/2021   Procedure: MASTECTOMY MODIFIED RADICAL;  Surgeon: Lucretia Roers, MD;  Location: [facility];  Service: General;  Laterality: Left;  . PORTACATH PLACEMENT Right 07/17/2020   Procedure: INSERTION PORT-A-CATH;  Surgeon: Lucretia Roers, MD;  Location: [facility];  Service: General;  Laterality: Right;  . SALPINGOOPHORECTOMY  06/09/2012   Procedure: SALPINGO OOPHERECTOMY;  Surgeon: Gus Height, MD;  Location: [facility];  Service: Gynecology;  Laterality: Bilateral;  . TUBAL LIGATION      I have reviewed the social history and family history with the patient and they are unchanged from previous note.  ALLERGIES:  has No Known Allergies.  MEDICATIONS:  Current Outpatient Medications  Medication Sig Dispense Refill  . albuterol (VENTOLIN HFA) 108 (90 Base)  MCG/ACT inhaler TAKE 2 PUFFS BY MOUTH EVERY 6 HOURS AS NEEDED FOR WHEEZE OR SHORTNESS OF BREATH (Patient taking differently: Inhale 2 puffs into the lungs every 6 (six) hours as needed.) 8.5 Inhaler 0  . atorvastatin (LIPITOR) 20 MG tablet Take 1 tablet (20 mg total) by mouth daily. 90 tablet 1  . capecitabine (XELODA) 500 MG tablet Take 3 tablets (1,500 mg total) by mouth 2 (two) times daily after a meal. Take for 14 days, then hold for 7 days. Repeat every 21 days. 84 tablet 2  . diphenhydrAMINE (BENADRYL) 25 mg capsule Take 25 mg by mouth every 6 (six) hours as needed for allergies. Allergies.    Marland Kitchen esomeprazole (NEXIUM) 20 MG capsule Take 20 mg by mouth daily before breakfast.    . ibuprofen (ADVIL,MOTRIN) 200 MG tablet Take 400-600 mg by mouth every 8 (eight) hours as needed for moderate pain (pain.).    Marland Kitchen lidocaine-prilocaine (EMLA) cream Apply a pea sized amount to port a cath site and cover with plastic wrap 1 hour prior to chemotherapy appointments 30 g 3  . lisinopril (ZESTRIL) 5 MG tablet Take 1 tablet (5 mg total) by mouth daily. 90 tablet 1  . loratadine (CLARITIN) 10 MG tablet Take 10 mg by mouth daily.    . magnesium oxide (MAG-OX) 400 (241.3 Mg) MG tablet Take 1 tablet (400 mg total) by mouth 2 (two) times daily. 60 tablet 3  . naproxen sodium (ALEVE) 220 MG tablet Take 440 mg by mouth 3 (three) times daily as needed (pain.).    Marland Kitchen ondansetron (ZOFRAN-ODT) 4 MG disintegrating tablet Take 1 tablet (4 mg total) by mouth every 6 (six) hours as needed for nausea. 20 tablet 0  . potassium chloride (KLOR-CON) 10 MEQ tablet TAKE 1 TABLET BY MOUTH EVERY DAY (Patient taking differently: Take 10 mEq by mouth daily.) 90 tablet 6  . prochlorperazine (COMPAZINE) 10 MG tablet Take 1 tablet (10 mg total) by mouth every 6 (six) hours as needed (Nausea or vomiting). 60 tablet 6  . sertraline (ZOLOFT) 50 MG tablet Take 1 tablet (50 mg total) by mouth daily. 90 tablet 1   No current facility-administered  medications for this visit.    PHYSICAL EXAMINATION: ECOG PERFORMANCE STATUS: 0 - Asymptomatic  Vitals:   06/05/21 1156  BP: 121/82  Pulse: 93  Resp: 18  Temp: (!) 97.1 F (36.2 C)  SpO2: 94%   Filed Weights   06/05/21 1156  Weight: 166 lb 6.4 oz (75.5 kg)    GENERAL:alert, no distress and comfortable SKIN: skin color, texture, turgor are normal, no rashes or significant lesions EYES: normal, Conjunctiva are pink and non-injected, sclera clear OROPHARYNX:no exudate, no erythema and lips, buccal mucosa, and tongue normal  NECK: supple, thyroid normal size, non-tender, without nodularity LYMPH:  no palpable lymphadenopathy in the cervical, axillary or inguinal LUNGS: clear to auscultation and percussion with normal breathing effort HEART: regular rate & rhythm and no murmurs and no lower extremity edema ABDOMEN:abdomen soft, non-tender  and normal bowel sounds Musculoskeletal:no cyanosis of digits and no clubbing  NEURO: alert & oriented x 3 with fluent speech, no focal motor/sensory deficits  LABORATORY DATA:  I have reviewed the data as listed CBC Latest Ref Rng & Units 06/05/2021 05/02/2021 04/04/2021  WBC 4.0 - 10.5 K/uL 6.7 9.1 7.3  Hemoglobin 12.0 - 15.0 g/dL 11.9(L) 13.5 12.0  Hematocrit 36.0 - 46.0 % 36.3 41.1 37.1  Platelets 150 - 400 K/uL 205 239 258     CMP Latest Ref Rng & Units 06/05/2021 05/02/2021 04/04/2021  Glucose 70 - 99 mg/dL 191(H) 124(H) 218(H)  BUN 6 - 20 mg/dL $Remove'14 12 12  'HkdhGMU$ Creatinine 0.44 - 1.00 mg/dL 0.96 1.00 1.07(H)  Sodium 135 - 145 mmol/L 135 137 136  Potassium 3.5 - 5.1 mmol/L 3.7 3.7 3.8  Chloride 98 - 111 mmol/L 101 102 100  CO2 22 - 32 mmol/L $RemoveB'25 26 25  'sZQjlcgx$ Calcium 8.9 - 10.3 mg/dL 8.8(L) 9.5 9.2  Total Protein 6.5 - 8.1 g/dL 7.0 7.9 7.2  Total Bilirubin 0.3 - 1.2 mg/dL 0.5 0.5 0.6  Alkaline Phos 38 - 126 U/L 93 94 89  AST 15 - 41 U/L $Remo'29 27 17  'DeRfP$ ALT 0 - 44 U/L 32 31 22      RADIOGRAPHIC STUDIES: I have personally reviewed the radiological images  as listed and agreed with the findings in the report. No results found.   ASSESSMENT & PLAN:  55 yo female  1.  Triple negative left breast cancer, stage IIIB, cT2N1M0, ypT1aN0 -She underwent neoadjuvant chemotherapy, and had an excellent response to treatment.  -She is on adjuvant Xeloda, tolerating well -Labs reviewed, adequate for treatment.  She is finishing cycle 4 Xeloda today, will start cycle 5 on 6/15 -V. tach of activation episode in which will be 6 months, including concurrent radiation. -Due to her initial lymph node positive disease, she would benefit from adjuvant postmastectomy radiation to reduce her risk of local recurrence.  I discussed the benefit and side effects, she is interested.  We will refer her to [facility] radiation oncology in [place] to be close to her home -I also discussed the keynote 522 trial data, which demonstrated the benefit of pembrolizumab in addition to chemo a high risk early stage triple negative breast cancer.  It was approved in Feb 2022. Although she has completed neoadjuvant chemo, I still think that some benefit adding 1 year adjuvant pembrolizumab for her to reduce risk of recurrence and increased cancer free survival. -Potential benefits and side effects, especially autoimmune related pneumonitis, endocrine disorder, fatigue, skin lesion, etc. were discussed with her in detail.  She agrees to proceed -Lab and f/yu in 2 weeks    Plan -start cycle 5 Xeloda on 6/15, dose may change when she starts radiation  -radiation referral to [facility] in 2 weeks -lab, flush, f/u and Keytruda in 2 weeks   Orders Placed This Encounter  Procedures  . CBC with Differential/Platelet    Standing Status:   Standing    Number of Occurrences:   20    Standing Expiration Date:   06/05/2022    Order Specific Question:   Release to patient    Answer:   Immediate  . Comprehensive metabolic panel    Standing Status:   Standing    Number of Occurrences:   20     Standing Expiration Date:   06/05/2022    Order Specific Question:   Release to patient    Answer:  Immediate  . Magnesium    Standing Status:   Standing    Number of Occurrences:   20    Standing Expiration Date:   06/05/2022    Order Specific Question:   Release to patient    Answer:   Immediate  . TSH    Standing Status:   Standing    Number of Occurrences:   20    Standing Expiration Date:   06/05/2022    Order Specific Question:   Release to patient    Answer:   Immediate   All questions were answered. The patient knows to call the clinic with any problems, questions or concerns. No barriers to learning was detected. I spent 35 minutes counseling the patient face to face. The total time spent in the appointment was 40 minutes and more than 50% was on counseling and review of test results     Truitt Merle, MD 06/05/21

## 2021-06-05 ENCOUNTER — Inpatient Hospital Stay (HOSPITAL_BASED_OUTPATIENT_CLINIC_OR_DEPARTMENT_OTHER): Payer: 59 | Admitting: Hematology

## 2021-06-05 ENCOUNTER — Inpatient Hospital Stay (HOSPITAL_COMMUNITY): Payer: 59 | Attending: Hematology and Oncology

## 2021-06-05 ENCOUNTER — Other Ambulatory Visit: Payer: Self-pay

## 2021-06-05 ENCOUNTER — Encounter (HOSPITAL_COMMUNITY): Payer: Self-pay | Admitting: Hematology

## 2021-06-05 VITALS — BP 121/82 | HR 93 | Temp 97.1°F | Resp 18 | Wt 166.4 lb

## 2021-06-05 DIAGNOSIS — C50919 Malignant neoplasm of unspecified site of unspecified female breast: Secondary | ICD-10-CM

## 2021-06-05 DIAGNOSIS — Z9012 Acquired absence of left breast and nipple: Secondary | ICD-10-CM | POA: Diagnosis not present

## 2021-06-05 DIAGNOSIS — Z87891 Personal history of nicotine dependence: Secondary | ICD-10-CM | POA: Insufficient documentation

## 2021-06-05 DIAGNOSIS — Z171 Estrogen receptor negative status [ER-]: Secondary | ICD-10-CM | POA: Diagnosis not present

## 2021-06-05 DIAGNOSIS — Z803 Family history of malignant neoplasm of breast: Secondary | ICD-10-CM | POA: Diagnosis not present

## 2021-06-05 DIAGNOSIS — Z801 Family history of malignant neoplasm of trachea, bronchus and lung: Secondary | ICD-10-CM | POA: Insufficient documentation

## 2021-06-05 DIAGNOSIS — C773 Secondary and unspecified malignant neoplasm of axilla and upper limb lymph nodes: Secondary | ICD-10-CM | POA: Diagnosis not present

## 2021-06-05 DIAGNOSIS — Z800 Family history of malignant neoplasm of digestive organs: Secondary | ICD-10-CM | POA: Insufficient documentation

## 2021-06-05 DIAGNOSIS — Z8541 Personal history of malignant neoplasm of cervix uteri: Secondary | ICD-10-CM | POA: Diagnosis not present

## 2021-06-05 DIAGNOSIS — C50012 Malignant neoplasm of nipple and areola, left female breast: Secondary | ICD-10-CM

## 2021-06-05 LAB — CBC WITH DIFFERENTIAL/PLATELET
Abs Immature Granulocytes: 0.03 10*3/uL (ref 0.00–0.07)
Basophils Absolute: 0 10*3/uL (ref 0.0–0.1)
Basophils Relative: 1 %
Eosinophils Absolute: 0.2 10*3/uL (ref 0.0–0.5)
Eosinophils Relative: 3 %
HCT: 36.3 % (ref 36.0–46.0)
Hemoglobin: 11.9 g/dL — ABNORMAL LOW (ref 12.0–15.0)
Immature Granulocytes: 0 %
Lymphocytes Relative: 33 %
Lymphs Abs: 2.2 10*3/uL (ref 0.7–4.0)
MCH: 29.5 pg (ref 26.0–34.0)
MCHC: 32.8 g/dL (ref 30.0–36.0)
MCV: 90.1 fL (ref 80.0–100.0)
Monocytes Absolute: 0.5 10*3/uL (ref 0.1–1.0)
Monocytes Relative: 8 %
Neutro Abs: 3.7 10*3/uL (ref 1.7–7.7)
Neutrophils Relative %: 55 %
Platelets: 205 10*3/uL (ref 150–400)
RBC: 4.03 MIL/uL (ref 3.87–5.11)
RDW: 17.9 % — ABNORMAL HIGH (ref 11.5–15.5)
WBC: 6.7 10*3/uL (ref 4.0–10.5)
nRBC: 0 % (ref 0.0–0.2)

## 2021-06-05 LAB — COMPREHENSIVE METABOLIC PANEL
ALT: 32 U/L (ref 0–44)
AST: 29 U/L (ref 15–41)
Albumin: 3.9 g/dL (ref 3.5–5.0)
Alkaline Phosphatase: 93 U/L (ref 38–126)
Anion gap: 9 (ref 5–15)
BUN: 14 mg/dL (ref 6–20)
CO2: 25 mmol/L (ref 22–32)
Calcium: 8.8 mg/dL — ABNORMAL LOW (ref 8.9–10.3)
Chloride: 101 mmol/L (ref 98–111)
Creatinine, Ser: 0.96 mg/dL (ref 0.44–1.00)
GFR, Estimated: >60 mL/min (ref >60)
Glucose, Bld: 191 mg/dL — ABNORMAL HIGH (ref 70–99)
Potassium: 3.7 mmol/L (ref 3.5–5.1)
Sodium: 135 mmol/L (ref 135–145)
Total Bilirubin: 0.5 mg/dL (ref 0.3–1.2)
Total Protein: 7 g/dL (ref 6.5–8.1)

## 2021-06-05 LAB — MAGNESIUM: Magnesium: 1.7 mg/dL (ref 1.7–2.4)

## 2021-06-05 MED ORDER — HEPARIN SOD (PORK) LOCK FLUSH 100 UNIT/ML IV SOLN
500.0000 [IU] | Freq: Once | INTRAVENOUS | Status: AC
  Administered 2021-06-05: 500 [IU] via INTRAVENOUS

## 2021-06-05 MED ORDER — SODIUM CHLORIDE 0.9% FLUSH
10.0000 mL | Freq: Once | INTRAVENOUS | Status: AC
  Administered 2021-06-05: 10 mL via INTRAVENOUS

## 2021-06-05 NOTE — Patient Instructions (Signed)
[facility]  Discharge Instructions: Thank you for choosing [facility] to provide your oncology and hematology care.  If you have a lab appointment with the [facility], please come in thru the Main Entrance and check in at the main information desk.  Wear comfortable clothing and clothing appropriate for easy access to any Portacath or PICC line.   We strive to give you quality time with your provider. You may need to reschedule your appointment if you arrive late (15 or more minutes).  Arriving late affects you and other patients whose appointments are after yours.  Also, if you miss three or more appointments without notifying the office, you may be dismissed from the clinic at the provider's discretion.      For prescription refill requests, have your pharmacy contact our office and allow 72 hours for refills to be completed.    Today your port was flushed and labs were drawn.   To help prevent nausea and vomiting after your treatment, we encourage you to take your nausea medication as directed.  BELOW ARE SYMPTOMS THAT SHOULD BE REPORTED IMMEDIATELY: . *FEVER GREATER THAN 100.4 F (38 C) OR HIGHER . *CHILLS OR SWEATING . *NAUSEA AND VOMITING THAT IS NOT CONTROLLED WITH YOUR NAUSEA MEDICATION . *UNUSUAL SHORTNESS OF BREATH . *UNUSUAL BRUISING OR BLEEDING . *URINARY PROBLEMS (pain or burning when urinating, or frequent urination) . *BOWEL PROBLEMS (unusual diarrhea, constipation, pain near the anus) . TENDERNESS IN MOUTH AND THROAT WITH OR WITHOUT PRESENCE OF ULCERS (sore throat, sores in mouth, or a toothache) . UNUSUAL RASH, SWELLING OR PAIN  . UNUSUAL VAGINAL DISCHARGE OR ITCHING   Items with * indicate a potential emergency and should be followed up as soon as possible or go to the Emergency Department if any problems should occur.  Please show the CHEMOTHERAPY ALERT CARD or IMMUNOTHERAPY ALERT CARD at check-in to the Emergency Department and triage  nurse.  Should you have questions after your visit or need to cancel or reschedule your appointment, please contact [facility] [phone]  and follow the prompts.  Office hours are 8:00 a.m. to 4:30 p.m. Monday - Friday. Please note that voicemails left after 4:00 p.m. may not be returned until the following business day.  We are closed weekends and major holidays. You have access to a nurse at all times for urgent questions. Please call the main number to the clinic [phone] and follow the prompts.  For any non-urgent questions, you may also contact your provider using MyChart. We now offer e-Visits for anyone 21 and older to request care online for non-urgent symptoms. For details visit [email or web address].   Also download the MyChart app! Go to the app store, search "MyChart", open the app, select [facility], and log in with your MyChart username and password.  Due to Covid, a mask is required upon entering the hospital/clinic. If you do not have a mask, one will be given to you upon arrival. For doctor visits, patients may have 1 support person aged 3 or older with them. For treatment visits, patients cannot have anyone with them due to current Covid guidelines and our immunocompromised population.

## 2021-06-05 NOTE — Progress Notes (Signed)
Port flushed with good blood return noted. Labs drawn. No bruising or swelling at site. Bandaid applied and patient discharged in satisfactory condition. VVS stable with no signs or symptoms of distressed noted.  

## 2021-06-05 NOTE — Patient Instructions (Addendum)
[place] at [facility] Discharge Instructions  You were seen today by Dr. Burr Medico she reviewed your labs and they look good. Your hemoglobin is slightly lower than last time but this is coming from the Xeloda. You do not need to do anything for the hemoglobin it will improve when you finish the Xeloda. Dr. Burr Medico discussed radiation therapy with you. You will be referred to [facility] in [place] for radiation when you finish chemotherapy. She discussed immunotherapy treatment- Keytruda. It is given every 3 weeks for one year through your Port-A-Cath. You will follow-up with Dr. Delton Coombes in 3 weeks, we will plan to start [place] at that time.  Please call the clinic after meeting with the Radiation Oncologist so that we can ensure appropriate scheduling.  Thank you for choosing [place] at [facility] to provide your oncology and hematology care.  To afford each patient quality time with our provider, please arrive at least 15 minutes before your scheduled appointment time.   If you have a lab appointment with the [facility] please come in thru the Main Entrance and check in at the main information desk.  You need to re-schedule your appointment should you arrive 10 or more minutes late.  We strive to give you quality time with our providers, and arriving late affects you and other patients whose appointments are after yours.  Also, if you no show three or more times for appointments you may be dismissed from the clinic at the providers discretion.     Again, thank you for choosing [facility]  Our hope is that these requests will decrease the amount of time that you wait before being seen by our physicians.       _____________________________________________________________  Should you have questions after your visit to [facility], please contact our office at [phone] and follow the prompts.  Our office hours are  8:00 a.m. and 4:30 p.m. Monday - Friday.  Please note that voicemails left after 4:00 p.m. may not be returned until the following business day.  We are closed weekends and major holidays.  You do have access to a nurse 24-7, just call the main number to the clinic [phone] and do not press any options, hold on the line and a nurse will answer the phone.    For prescription refill requests, have your pharmacy contact our office and allow 72 hours.    Due to Covid, you will need to wear a mask upon entering the hospital. If you do not have a mask, a mask will be given to you at the Main Entrance upon arrival. For doctor visits, patients may have 1 support person age 97 or older with them. For treatment visits, patients can not have anyone with them due to social distancing guidelines and our immunocompromised population.

## 2021-06-06 ENCOUNTER — Other Ambulatory Visit: Payer: Self-pay

## 2021-06-06 ENCOUNTER — Encounter: Payer: Self-pay | Admitting: Radiation Oncology

## 2021-06-06 ENCOUNTER — Ambulatory Visit
Admission: RE | Admit: 2021-06-06 | Discharge: 2021-06-06 | Disposition: A | Discharge status: Home / Self Care | Payer: 59 | Source: Ambulatory Visit | Attending: Radiation Oncology | Admitting: Radiation Oncology

## 2021-06-06 VITALS — Ht <= 58 in | Wt 166.0 lb

## 2021-06-06 DIAGNOSIS — C50919 Malignant neoplasm of unspecified site of unspecified female breast: Secondary | ICD-10-CM

## 2021-06-06 NOTE — Progress Notes (Signed)
New Breast Cancer Diagnosis: Left Breast  Left Mastectomy 02/09/2021   Receptor Status: ER(negative), PR (negative), HerD2-neu (negative), Ki-(15%)  Surgeon and surgical plan, if any: Dr. Briant Cedar 02/09/2021 -Mastectomy Modified Radical   Medical oncologist, treatment if any:   Dr. Burr Medico 06/05/2021 -She underwent neoadjuvant chemotherapy, and had an excellent response to treatment.  -She is on adjuvant Xeloda, tolerating well.  She is finishing cycle 4 Xeloda today, will start cycle 5 on 6/15 -V. tach of activation episode in which will be 6 months, including concurrent radiation. -Due to her initial lymph node positive disease, she would benefit from adjuvant postmastectomy radiation to reduce her risk of local recurrence.  I discussed the benefit and side effects, she is interested. -We will refer her to [facility] radiation oncology in [place] to be close to her home -I also discussed the keynote 522 trial data, which demonstrated the benefit of pembrolizumab in addition to chemo a high risk early stage triple negative breast cancer.  It was approved in Feb 2022. Although she has completed neoadjuvant chemo, I still think that some benefit adding 1 year adjuvant pembrolizumab for her to reduce risk of recurrence and increased cancer free survival.  Follow up with Dr. Delton Coombes 06/18/2021   Family History of Breast/Ovarian/Prostate Cancer: None noted by the patient.  Lymphedema issues, if any: None noted   Pain issues, if any:  None  SAFETY ISSUES: Prior radiation? No Pacemaker/ICD? No Possible current pregnancy? Hysterectomy Is the patient on methotrexate? No  Current Complaints / other details:   -Genetic testing- negative no pathologic variants detected on the [facility] [employer] Next-Cancer +RNAinsight panel.  -Port- right chest wall

## 2021-06-06 NOTE — Progress Notes (Signed)
Radiation Oncology         [phone] ________________________________  Name: Elizabeth Graves        MRN: [id number]  Date of Service: 06/06/2021 DOB: 01/11/1966  AQ:[id number], Elayne Snare, MD   REFERRING PHYSICIAN:   DIAGNOSIS: There were no encounter diagnoses.   HISTORY OF PRESENT ILLNESS: Elizabeth Graves is a 55 y.o. female with a history of left breast cancer.  The patient was originally diagnosed in June 2021 with a mass in the left breast and concerns for node involvement.  By ultrasound at 12:00 there was a 2.7 cm mass and 2 abnormal appearing lymph nodes were noted the largest measured 2.2 cm in the left axilla.  She underwent biopsies on 06/20/2020 the breast specimen revealed a grade 2 invasive ductal carcinoma with LVSI, and her axillary lymph node was also consistent with metastatic deposit.  Her carcinoma was triple negative.  She was counseled on neoadjuvant chemotherapy, and her pretreatment MRI scan measured the mass at 4.2 cm in the subareolar left breast extending to the level of the nipple with associated retraction and periareolar skin thickening 6 abnormal appearing lymph nodes were seen in the left axilla, and her right breast did not have any MRI evidence of disease.  She went on to proceed with neoadjuvant chemotherapy between 08/01/2020 and 12/20/2020.  She proceeded with modified radical mastectomy of the left breast with axillary lymph node dissection on 02/09/2021.  Final pathology revealed 2 mm of grade 2 invasive ductal carcinoma resection margins were negative and treatment effect was seen within the breast and the lymph nodes, 12 sampled lymph nodes were negative for carcinoma 1 had evidence of prior biopsy.  She was started on adjuvant Xeloda given her residual disease and has had 4 cycles of this.  She is contacted via MyChart to discuss adjuvant radiotherapy in the postmastectomy setting.    PREVIOUS RADIATION THERAPY: No   PAST MEDICAL HISTORY:  Past Medical History:   Diagnosis Date  . Asthma   . Breast cancer ([place])   . Cancer ([place])    cervical - 20 yrs ago  . Carpal tunnel syndrome   . Depression   . Family history of breast cancer   . Family history of lung cancer   . Hypertension   . Hypokalemia    tx w/ k-Dur  . Port-A-Cath in place 07/24/2020  . Seasonal allergies    tx with benadryl prn  . SVD (spontaneous vaginal delivery)    x 2       PAST SURGICAL HISTORY: Past Surgical History:  Procedure Laterality Date  . ABDOMINAL HYSTERECTOMY    . CO2 Laser of Cervix     for cervical cancer 20 yrs ago  . DILATION AND CURETTAGE OF UTERUS  12/2010   polyp removed  . DILATION AND CURETTAGE OF UTERUS     x 3 for MAB  . LAPAROSCOPIC ASSISTED VAGINAL HYSTERECTOMY  06/09/2012   Procedure: LAPAROSCOPIC ASSISTED VAGINAL HYSTERECTOMY;  Surgeon: Gus Height, MD;  Location: [facility];  Service: Gynecology;  Laterality: N/A;  . MASTECTOMY MODIFIED RADICAL Left 02/09/2021   Procedure: MASTECTOMY MODIFIED RADICAL;  Surgeon: Virl Cagey, MD;  Location: [facility];  Service: General;  Laterality: Left;  . PORTACATH PLACEMENT Right 07/17/2020   Procedure: INSERTION PORT-A-CATH;  Surgeon: Virl Cagey, MD;  Location: [facility];  Service: General;  Laterality: Right;  . SALPINGOOPHORECTOMY  06/09/2012   Procedure: SALPINGO OOPHERECTOMY;  Surgeon: Gus Height, MD;  Location: [facility];  Service: Gynecology;  Laterality: Bilateral;  . TUBAL LIGATION       FAMILY HISTORY:  Family History  Problem Relation Age of Onset  . Heart disease Mother   . Lung cancer Mother 15       smoker  . Heart disease Father   . Esophageal cancer Father        GE junction, dx. in his early 34s  . Diverticulitis Sister   . Diabetes Sister   . Diabetes Maternal Grandmother   . Heart disease Maternal Grandmother   . Throat cancer Maternal Grandmother        dx. late 50s  . Healthy Sister   . Healthy Daughter   . Healthy Son   . Breast cancer Paternal Aunt        limited info      SOCIAL HISTORY:  reports that she quit smoking about 16 months ago. Her smoking use included cigarettes. She started smoking about 27 years ago. She has a 16.00 pack-year smoking history. She has never used smokeless tobacco. She reports that she does not drink alcohol and does not use drugs. The patient is married and lives in [place], [place]. She works in [place], [place].    ALLERGIES: Patient has no known allergies.   MEDICATIONS:  Current Outpatient Medications  Medication Sig Dispense Refill  . albuterol (VENTOLIN HFA) 108 (90 Base) MCG/ACT inhaler TAKE 2 PUFFS BY MOUTH EVERY 6 HOURS AS NEEDED FOR WHEEZE OR SHORTNESS OF BREATH (Patient taking differently: Inhale 2 puffs into the lungs every 6 (six) hours as needed.) 8.5 Inhaler 0  . atorvastatin (LIPITOR) 20 MG tablet Take 1 tablet (20 mg total) by mouth daily. 90 tablet 1  . capecitabine (XELODA) 500 MG tablet Take 3 tablets (1,500 mg total) by mouth 2 (two) times daily after a meal. Take for 14 days, then hold for 7 days. Repeat every 21 days. 84 tablet 2  . diphenhydrAMINE (BENADRYL) 25 mg capsule Take 25 mg by mouth every 6 (six) hours as needed for allergies. Allergies.    Marland Kitchen esomeprazole (NEXIUM) 20 MG capsule Take 20 mg by mouth daily before breakfast.    . ibuprofen (ADVIL,MOTRIN) 200 MG tablet Take 400-600 mg by mouth every 8 (eight) hours as needed for moderate pain (pain.).    Marland Kitchen lidocaine-prilocaine (EMLA) cream Apply a pea sized amount to port a cath site and cover with plastic wrap 1 hour prior to chemotherapy appointments 30 g 3  . lisinopril (ZESTRIL) 5 MG tablet Take 1 tablet (5 mg total) by mouth daily. 90 tablet 1  . loratadine (CLARITIN) 10 MG tablet Take 10 mg by mouth daily.    . magnesium oxide (MAG-OX) 400 (241.3 Mg) MG tablet Take 1 tablet (400 mg total) by mouth 2 (two) times daily. 60 tablet 3  . naproxen sodium (ALEVE) 220 MG tablet Take 440 mg by mouth 3 (three) times daily as needed (pain.).    Marland Kitchen ondansetron  (ZOFRAN-ODT) 4 MG disintegrating tablet Take 1 tablet (4 mg total) by mouth every 6 (six) hours as needed for nausea. 20 tablet 0  . potassium chloride (KLOR-CON) 10 MEQ tablet TAKE 1 TABLET BY MOUTH EVERY DAY (Patient taking differently: Take 10 mEq by mouth daily.) 90 tablet 6  . prochlorperazine (COMPAZINE) 10 MG tablet Take 1 tablet (10 mg total) by mouth every 6 (six) hours as needed (Nausea or vomiting). 60 tablet 6  . sertraline (ZOLOFT) 50 MG tablet Take 1 tablet (50 mg  total) by mouth daily. 90 tablet 1   No current facility-administered medications for this encounter.     REVIEW OF SYSTEMS: On review of systems, the patient reports that she is doing well overall. She denies any chest pain, shortness of breath, cough, fevers, chills, night sweats, unintended weight changes. She denies any bowel or bladder disturbances, and denies abdominal pain, nausea or vomiting. She denies any new musculoskeletal or joint aches or pains. A complete review of systems is obtained and is otherwise negative.     PHYSICAL EXAM:  Wt Readings from Last 3 Encounters:  06/06/21 166 lb (75.3 kg)  06/05/21 166 lb 6.4 oz (75.5 kg)  05/02/21 163 lb 11.2 oz (74.3 kg)   Unable to assess given encounter type.   ECOG = 0  0 - Asymptomatic (Fully active, able to carry on all predisease activities without restriction)  1 - Symptomatic but completely ambulatory (Restricted in physically strenuous activity but ambulatory and able to carry out work of a light or sedentary nature. For example, light housework, office work)  2 - Symptomatic, <50% in bed during the day (Ambulatory and capable of all self care but unable to carry out any work activities. Up and about more than 50% of waking hours)  3 - Symptomatic, >50% in bed, but not bedbound (Capable of only limited self-care, confined to bed or chair 50% or more of waking hours)  4 - Bedbound (Completely disabled. Cannot carry on any self-care. Totally confined  to bed or chair)  5 - Death   Eustace Pen MM, Creech RH, Tormey DC, et al. [phone]). "Toxicity and response criteria of the [facility]". [place] Oncol. 5 (6): 649-55    LABORATORY DATA:  Lab Results  Component Value Date   WBC 6.7 06/05/2021   HGB 11.9 (L) 06/05/2021   HCT 36.3 06/05/2021   MCV 90.1 06/05/2021   PLT 205 06/05/2021   Lab Results  Component Value Date   NA 135 06/05/2021   K 3.7 06/05/2021   CL 101 06/05/2021   CO2 25 06/05/2021   Lab Results  Component Value Date   ALT 32 06/05/2021   AST 29 06/05/2021   ALKPHOS 93 06/05/2021   BILITOT 0.5 06/05/2021      RADIOGRAPHY: No results found.     IMPRESSION/PLAN: 1. Stage IIIB, cT2N1M0 grade 2 triple negative invasive ductal carcinoma of the left breast with great response to neoadjuvant chemotherapy but residual disease at the time of surgery. Dr. Lisbeth Renshaw discusses the pathology findings and reviews the nature of node positive left breast disease.  Dr. Luis Abed discussed with the patient the possibilities of using immunotherapy to further reduce her risks of recurrence and consolidation, she will follow-up with Dr. Delton Coombes for further discussion about this in about 2 weeks time.  Dr. Lisbeth Renshaw discusses the rationale for adjuvant external radiotherapy to the chest wall and regional lymph nodes to reduce risks of local recurrence followed by antiestrogen therapy. We discussed the risks, benefits, short, and long term effects of radiotherapy, as well as the curative intent, and the patient is interested in proceeding. Dr. Lisbeth Renshaw discusses the delivery and logistics of radiotherapy and anticipates a course of 6-1/2 weeks of radiotherapy to the left chest wall and regional lymph nodes with deep inspiration breath-hold technique.  She will come in for simulation on 06/07/2021 at which time she will signed written consent to proceed. 2. Contraceptive counseling.  The patient has undergone hysterectomy and does not  need  pregnancy testing prior to proceeding with simulation or radiotherapy.  This encounter was provided by telemedicine platform MyChart that we had to convert to a telephone call due to connectivity.  The patient has provided two factor identification and has given verbal consent for this type of encounter and has been advised to only accept a meeting of this type in a secure network environment. The time spent during this encounter was 60 minutes including preparation, discussion, and coordination of the patient's care. The attendants for this meeting include Blenda Nicely, RN, Dr. Lisbeth Renshaw, Hayden Pedro  and [place].  During the encounter,  Blenda Nicely, RN, Dr. Lisbeth Renshaw, and Hayden Pedro were located at [facility].  Elizabeth Graves was located at home.    The above documentation reflects my direct findings during this shared patient visit. Please see the separate note by Dr. Lisbeth Renshaw on this date for the remainder of the patient's plan of care.    Carola Rhine, [facility]    **Disclaimer: This note was dictated with voice recognition software. Similar sounding words can inadvertently be transcribed and this note may contain transcription errors which may not have been corrected upon publication of note.**

## 2021-06-07 ENCOUNTER — Ambulatory Visit
Admission: RE | Admit: 2021-06-07 | Discharge: 2021-06-07 | Disposition: A | Discharge status: Home / Self Care | Payer: 59 | Source: Ambulatory Visit | Attending: Radiation Oncology | Admitting: Radiation Oncology

## 2021-06-07 DIAGNOSIS — Z510 Encounter for antineoplastic radiation therapy: Secondary | ICD-10-CM | POA: Diagnosis not present

## 2021-06-07 DIAGNOSIS — C50919 Malignant neoplasm of unspecified site of unspecified female breast: Secondary | ICD-10-CM | POA: Diagnosis present

## 2021-06-12 NOTE — Progress Notes (Signed)
Pharmacist Chemotherapy Monitoring - Initial Assessment    Anticipated start date: 06/19/21   Regimen:  Are orders appropriate based on the patient's diagnosis, regimen, and cycle? Yes Does the plan date match the patient's scheduled date? Yes Is the sequencing of drugs appropriate? Yes Are the premedications appropriate for the patient's regimen? Yes Prior Authorization for treatment is: Pending If applicable, is the correct biosimilar selected based on the patient's insurance? not applicable  Organ Function and Labs: Are dose adjustments needed based on the patient's renal function, hepatic function, or hematologic function? Yes Are appropriate labs ordered prior to the start of patient's treatment? Yes Other organ system assessment, if indicated: N/A The following baseline labs, if indicated, have been ordered: pembrolizumab: baseline TSH +/- T4  Dose Assessment: Are the drug doses appropriate? Yes Are the following correct: Drug concentrations Yes IV fluid compatible with drug Yes Administration routes Yes Timing of therapy No If applicable, does the patient have documented access for treatment and/or plans for port-a-cath placement? no If applicable, have lifetime cumulative doses been properly documented and assessed? not applicable Lifetime Dose Tracking   Doxorubicin: 239.775 mg/m2 (416 mg) = 53.28 % of the maximum lifetime dose of 450 mg/m2   Carboplatin: 2,240 mg = 0.01 % of the maximum lifetime dose of 999,999,999 mg    Toxicity Monitoring/Prevention: The patient has the following take home antiemetics prescribed: Ondansetron The patient has the following take home medications prescribed: N/A Medication allergies and previous infusion related reactions, if applicable, have been reviewed and addressed. Yes The patient's current medication list has been assessed for drug-drug interactions with their chemotherapy regimen. no significant drug-drug interactions were  identified on review.  Order Review: Are the treatment plan orders signed? No Is the patient scheduled to see a provider prior to their treatment? Yes  I verify that I have reviewed each item in the above checklist and answered each question accordingly.  Romualdo Bolk [place], [place], 06/12/2021  1:38 PM

## 2021-06-18 ENCOUNTER — Inpatient Hospital Stay (HOSPITAL_COMMUNITY): Payer: 59

## 2021-06-18 ENCOUNTER — Other Ambulatory Visit: Payer: Self-pay

## 2021-06-18 ENCOUNTER — Ambulatory Visit (HOSPITAL_COMMUNITY): Payer: 59 | Admitting: Hematology

## 2021-06-18 DIAGNOSIS — C50919 Malignant neoplasm of unspecified site of unspecified female breast: Secondary | ICD-10-CM

## 2021-06-18 DIAGNOSIS — C50012 Malignant neoplasm of nipple and areola, left female breast: Secondary | ICD-10-CM

## 2021-06-18 NOTE — Progress Notes (Signed)
No Labs or treatment today per Dr. Delton Coombes.

## 2021-06-20 DIAGNOSIS — Z510 Encounter for antineoplastic radiation therapy: Secondary | ICD-10-CM | POA: Diagnosis not present

## 2021-06-21 ENCOUNTER — Ambulatory Visit: Payer: 59

## 2021-06-21 ENCOUNTER — Ambulatory Visit: Admission: RE | Admit: 2021-06-21 | Payer: 59 | Source: Ambulatory Visit | Admitting: Radiation Oncology

## 2021-06-21 DIAGNOSIS — Z510 Encounter for antineoplastic radiation therapy: Secondary | ICD-10-CM | POA: Diagnosis not present

## 2021-06-22 ENCOUNTER — Ambulatory Visit: Payer: 59

## 2021-06-25 ENCOUNTER — Ambulatory Visit
Admission: RE | Admit: 2021-06-25 | Discharge: 2021-06-25 | Disposition: A | Discharge status: Home / Self Care | Payer: 59 | Source: Ambulatory Visit | Attending: Radiation Oncology | Admitting: Radiation Oncology

## 2021-06-25 ENCOUNTER — Other Ambulatory Visit: Payer: Self-pay

## 2021-06-25 ENCOUNTER — Ambulatory Visit: Payer: 59

## 2021-06-25 DIAGNOSIS — Z510 Encounter for antineoplastic radiation therapy: Secondary | ICD-10-CM | POA: Diagnosis not present

## 2021-06-26 ENCOUNTER — Other Ambulatory Visit: Payer: Self-pay

## 2021-06-26 ENCOUNTER — Ambulatory Visit: Payer: 59

## 2021-06-26 ENCOUNTER — Ambulatory Visit
Admission: RE | Admit: 2021-06-26 | Discharge: 2021-06-26 | Disposition: A | Discharge status: Home / Self Care | Payer: 59 | Source: Ambulatory Visit | Attending: Radiation Oncology | Admitting: Radiation Oncology

## 2021-06-26 DIAGNOSIS — Z510 Encounter for antineoplastic radiation therapy: Secondary | ICD-10-CM | POA: Diagnosis not present

## 2021-06-27 ENCOUNTER — Ambulatory Visit
Admission: RE | Admit: 2021-06-27 | Discharge: 2021-06-27 | Disposition: A | Discharge status: Home / Self Care | Payer: 59 | Source: Ambulatory Visit | Attending: Radiation Oncology | Admitting: Radiation Oncology

## 2021-06-27 ENCOUNTER — Ambulatory Visit: Payer: 59

## 2021-06-27 DIAGNOSIS — Z510 Encounter for antineoplastic radiation therapy: Secondary | ICD-10-CM | POA: Diagnosis not present

## 2021-06-28 ENCOUNTER — Other Ambulatory Visit: Payer: Self-pay

## 2021-06-28 ENCOUNTER — Ambulatory Visit: Payer: 59

## 2021-06-28 ENCOUNTER — Ambulatory Visit
Admission: RE | Admit: 2021-06-28 | Discharge: 2021-06-28 | Disposition: A | Discharge status: Home / Self Care | Payer: 59 | Source: Ambulatory Visit | Attending: Radiation Oncology | Admitting: Radiation Oncology

## 2021-06-28 DIAGNOSIS — Z510 Encounter for antineoplastic radiation therapy: Secondary | ICD-10-CM | POA: Diagnosis not present

## 2021-06-28 NOTE — Progress Notes (Signed)

## 2021-06-29 ENCOUNTER — Ambulatory Visit: Payer: 59

## 2021-06-29 ENCOUNTER — Ambulatory Visit
Admission: RE | Admit: 2021-06-29 | Discharge: 2021-06-29 | Disposition: A | Discharge status: Home / Self Care | Payer: 59 | Source: Ambulatory Visit | Attending: Radiation Oncology | Admitting: Radiation Oncology

## 2021-06-29 DIAGNOSIS — C50012 Malignant neoplasm of nipple and areola, left female breast: Secondary | ICD-10-CM | POA: Insufficient documentation

## 2021-06-29 DIAGNOSIS — Z510 Encounter for antineoplastic radiation therapy: Secondary | ICD-10-CM | POA: Diagnosis present

## 2021-06-29 DIAGNOSIS — Z171 Estrogen receptor negative status [ER-]: Secondary | ICD-10-CM | POA: Diagnosis not present

## 2021-06-29 MED ORDER — ALRA NON-METALLIC DEODORANT (RAD-ONC)
1.0000 "application " | Freq: Once | TOPICAL | Status: AC
  Administered 2021-06-29: 1 via TOPICAL

## 2021-06-29 MED ORDER — RADIAPLEXRX EX GEL: Freq: Once | CUTANEOUS | Status: AC

## 2021-07-03 ENCOUNTER — Ambulatory Visit: Payer: 59

## 2021-07-03 ENCOUNTER — Other Ambulatory Visit: Payer: Self-pay

## 2021-07-03 ENCOUNTER — Ambulatory Visit
Admission: RE | Admit: 2021-07-03 | Discharge: 2021-07-03 | Disposition: A | Discharge status: Home / Self Care | Payer: 59 | Source: Ambulatory Visit | Attending: Radiation Oncology | Admitting: Radiation Oncology

## 2021-07-03 DIAGNOSIS — Z510 Encounter for antineoplastic radiation therapy: Secondary | ICD-10-CM | POA: Diagnosis not present

## 2021-07-04 ENCOUNTER — Ambulatory Visit: Payer: 59

## 2021-07-04 ENCOUNTER — Ambulatory Visit
Admission: RE | Admit: 2021-07-04 | Discharge: 2021-07-04 | Disposition: A | Discharge status: Home / Self Care | Payer: 59 | Source: Ambulatory Visit | Attending: Radiation Oncology | Admitting: Radiation Oncology

## 2021-07-04 DIAGNOSIS — Z510 Encounter for antineoplastic radiation therapy: Secondary | ICD-10-CM | POA: Diagnosis not present

## 2021-07-05 ENCOUNTER — Encounter (HOSPITAL_COMMUNITY): Payer: Self-pay | Admitting: Hematology

## 2021-07-05 ENCOUNTER — Other Ambulatory Visit: Payer: Self-pay

## 2021-07-05 ENCOUNTER — Ambulatory Visit: Payer: 59

## 2021-07-05 ENCOUNTER — Inpatient Hospital Stay (HOSPITAL_COMMUNITY): Payer: 59 | Attending: Hematology

## 2021-07-05 ENCOUNTER — Other Ambulatory Visit (HOSPITAL_COMMUNITY): Payer: Self-pay | Admitting: Hematology

## 2021-07-05 ENCOUNTER — Ambulatory Visit
Admission: RE | Admit: 2021-07-05 | Discharge: 2021-07-05 | Disposition: A | Discharge status: Home / Self Care | Payer: 59 | Source: Ambulatory Visit | Attending: Radiation Oncology | Admitting: Radiation Oncology

## 2021-07-05 ENCOUNTER — Inpatient Hospital Stay (HOSPITAL_BASED_OUTPATIENT_CLINIC_OR_DEPARTMENT_OTHER): Payer: 59 | Admitting: Hematology

## 2021-07-05 VITALS — BP 123/75 | Temp 97.0°F | Resp 18 | Wt 166.4 lb

## 2021-07-05 DIAGNOSIS — Z79899 Other long term (current) drug therapy: Secondary | ICD-10-CM

## 2021-07-05 DIAGNOSIS — Z171 Estrogen receptor negative status [ER-]: Secondary | ICD-10-CM

## 2021-07-05 DIAGNOSIS — E876 Hypokalemia: Secondary | ICD-10-CM | POA: Diagnosis not present

## 2021-07-05 DIAGNOSIS — C773 Secondary and unspecified malignant neoplasm of axilla and upper limb lymph nodes: Secondary | ICD-10-CM | POA: Diagnosis not present

## 2021-07-05 DIAGNOSIS — C50919 Malignant neoplasm of unspecified site of unspecified female breast: Secondary | ICD-10-CM

## 2021-07-05 DIAGNOSIS — C50012 Malignant neoplasm of nipple and areola, left female breast: Secondary | ICD-10-CM | POA: Diagnosis not present

## 2021-07-05 DIAGNOSIS — Z510 Encounter for antineoplastic radiation therapy: Secondary | ICD-10-CM | POA: Diagnosis not present

## 2021-07-05 LAB — COMPREHENSIVE METABOLIC PANEL
ALT: 40 U/L (ref 0–44)
AST: 41 U/L (ref 15–41)
Albumin: 4 g/dL (ref 3.5–5.0)
Alkaline Phosphatase: 96 U/L (ref 38–126)
Anion gap: 10 (ref 5–15)
BUN: 15 mg/dL (ref 6–20)
CO2: 25 mmol/L (ref 22–32)
Calcium: 9.4 mg/dL (ref 8.9–10.3)
Chloride: 102 mmol/L (ref 98–111)
Creatinine, Ser: 1.03 mg/dL — ABNORMAL HIGH (ref 0.44–1.00)
GFR, Estimated: >60 mL/min (ref >60)
Glucose, Bld: 217 mg/dL — ABNORMAL HIGH (ref 70–99)
Potassium: 3.9 mmol/L (ref 3.5–5.1)
Sodium: 137 mmol/L (ref 135–145)
Total Bilirubin: 0.5 mg/dL (ref 0.3–1.2)
Total Protein: 7.2 g/dL (ref 6.5–8.1)

## 2021-07-05 LAB — CBC WITH DIFFERENTIAL/PLATELET
Abs Immature Granulocytes: 0.03 10*3/uL (ref 0.00–0.07)
Basophils Absolute: 0 10*3/uL (ref 0.0–0.1)
Basophils Relative: 1 %
Eosinophils Absolute: 0.2 10*3/uL (ref 0.0–0.5)
Eosinophils Relative: 4 %
HCT: 37.3 % (ref 36.0–46.0)
Hemoglobin: 12.7 g/dL (ref 12.0–15.0)
Immature Granulocytes: 1 %
Lymphocytes Relative: 19 %
Lymphs Abs: 1 10*3/uL (ref 0.7–4.0)
MCH: 31.8 pg (ref 26.0–34.0)
MCHC: 34 g/dL (ref 30.0–36.0)
MCV: 93.5 fL (ref 80.0–100.0)
Monocytes Absolute: 0.4 10*3/uL (ref 0.1–1.0)
Monocytes Relative: 8 %
Neutro Abs: 3.6 10*3/uL (ref 1.7–7.7)
Neutrophils Relative %: 67 %
Platelets: 176 10*3/uL (ref 150–400)
RBC: 3.99 MIL/uL (ref 3.87–5.11)
RDW: 18.8 % — ABNORMAL HIGH (ref 11.5–15.5)
WBC: 5.2 10*3/uL (ref 4.0–10.5)
nRBC: 0 % (ref 0.0–0.2)

## 2021-07-05 LAB — TSH: TSH: 1.194 u[IU]/mL (ref 0.350–4.500)

## 2021-07-05 LAB — MAGNESIUM: Magnesium: 1.8 mg/dL (ref 1.7–2.4)

## 2021-07-05 NOTE — Progress Notes (Signed)
[facility] [address], [place] [address]   Patient Care Team: Kathyrn Drown, MD as PCP - General (Family Medicine) Brien Mates, RN as Oncology Nurse Navigator (Oncology) Donetta Potts, RN as Oncology Nurse Navigator (Oncology)  SUMMARY OF ONCOLOGIC HISTORY: Oncology History Overview Note  Cancer Staging Triple negative malignant neoplasm of breast [facility]) Staging form: Breast, AJCC 8th Edition - Clinical stage from 06/20/2020: Stage IIIB (cT2, cN1, cM0, G2, ER-, PR-, HER2-) - Signed by Truitt Merle, MD on 06/05/2021 Stage prefix: Initial diagnosis Histologic grading system: 3 grade system - Pathologic stage from 02/09/2021: No Stage Recommended (ypT1a, pN0, cM0, G2, ER-, PR-, HER2-) - Signed by Truitt Merle, MD on 06/05/2021 Stage prefix: Post-therapy Response to neoadjuvant therapy: Partial response Histologic grading system: 3 grade system    Triple negative malignant neoplasm of breast ([place])  06/20/2020 Cancer Staging   Staging form: Breast, AJCC 8th Edition - Clinical stage from 06/20/2020: Stage IIIB (cT2, cN1, cM0, G2, ER-, PR-, HER2-) - Signed by Truitt Merle, MD on 06/05/2021 Stage prefix: Initial diagnosis Histologic grading system: 3 grade system   07/05/2020 Initial Diagnosis   Triple negative malignant neoplasm of breast ([place])   08/01/2020 - 12/20/2020 Chemotherapy      Patient is on Antibody Plan: HEAD/NECK PEMBROLIZUMAB Q21D    08/17/2020 Genetic Testing   Negative genetic testing:  No pathogenic variants detected on the [facility] CustomNext-Cancer + RNAinsight panel. A variant of uncertain significance (VUS) was detected in the MSH2 gene called c.1600C>T (p.R534C). The report date is 08/17/2020.  The CustomNext-Cancer+RNAinsight panel offered by [facility] includes sequencing and rearrangement analysis for up to 91 genes, which included the following 47 genes for Elizabeth Graves:  APC*, ATM*, AXIN2, BARD1, BMPR1A, BRCA1*, BRCA2*, BRIP1*, CDH1*, CDK4, CDKN2A,  CHEK2*, DICER1, EPCAM, GREM1, HOXB13, MEN1, MLH1*, MSH2*, MSH3, MSH6*, MUTYH*, NBN, NF1*, NF2, NTHL1, PALB2*, PMS2*, POLD1, POLE, PTEN*, RAD51C*, RAD51D*, RECQL, RET, SDHA, SDHAF2, SDHB, SDHC, SDHD, SMAD4, SMARCA4, STK11, TP53*, TSC1, TSC2, and VHL.  DNA and RNA analyses performed for * genes.     02/09/2021 Cancer Staging   Staging form: Breast, AJCC 8th Edition - Pathologic stage from 02/09/2021: No Stage Recommended (ypT1a, pN0, cM0, G2, ER-, PR-, HER2-) - Signed by Truitt Merle, MD on 06/05/2021 Stage prefix: Post-therapy Response to neoadjuvant therapy: Partial response Histologic grading system: 3 grade system   06/18/2021 -  Chemotherapy      Patient is on Antibody Plan: HEAD/NECK PEMBROLIZUMAB Q21D    Drug-induced neutropenia ([place])   Virtual Visit via Video Note  I connected with Elizabeth Graves on 07/05/21 at 10:30 AM EDT by a video enabled telemedicine application and verified that I am speaking with the correct person using two identifiers.  Location: Patient: In the office Provider: At home   I discussed the limitations of evaluation and management by telemedicine and the availability of in person appointments. The patient expressed understanding and agreed to proceed.  CHIEF COMPLIANT: Follow-up for left breast cancer   INTERVAL HISTORY: Elizabeth Graves is a 55 y.o. female seen for follow-up of triple negative left breast cancer.  She just started cycle 6 of Xeloda on 07/04/2021. She started radiation therapy to the left chest wall about 2 weeks ago.  Reports some achiness in the left breast area.  Reports energy levels are 50% and appetite 100%.  Denies any nausea, vomiting or diarrhea.  No tenderness or erythema or skin peeling in the palms and soles.  REVIEW OF SYSTEMS:   Review of Systems  Constitutional:  Positive for fatigue (50% since radiation started.). Negative for appetite change.  HENT:   Negative for mouth sores.   Skin:  Negative for rash.  All other systems  reviewed and are negative.  I have reviewed the past medical history, past surgical history, social history and family history with the patient and they are unchanged from previous note.   ALLERGIES:   has No Known Allergies.   MEDICATIONS:  Current Outpatient Medications  Medication Sig Dispense Refill   albuterol (VENTOLIN HFA) 108 (90 Base) MCG/ACT inhaler TAKE 2 PUFFS BY MOUTH EVERY 6 HOURS AS NEEDED FOR WHEEZE OR SHORTNESS OF BREATH (Patient taking differently: Inhale 2 puffs into the lungs every 6 (six) hours as needed.) 8.5 Inhaler 0   atorvastatin (LIPITOR) 20 MG tablet Take 1 tablet (20 mg total) by mouth daily. 90 tablet 1   capecitabine (XELODA) 500 MG tablet Take 3 tablets (1,500 mg total) by mouth 2 (two) times daily after a meal. Take for 14 days, then hold for 7 days. Repeat every 21 days. 84 tablet 2   diphenhydrAMINE (BENADRYL) 25 mg capsule Take 25 mg by mouth every 6 (six) hours as needed for allergies. Allergies.     esomeprazole (NEXIUM) 20 MG capsule Take 20 mg by mouth daily before breakfast.     ibuprofen (ADVIL,MOTRIN) 200 MG tablet Take 400-600 mg by mouth every 8 (eight) hours as needed for moderate pain (pain.).     lisinopril (ZESTRIL) 5 MG tablet Take 1 tablet (5 mg total) by mouth daily. 90 tablet 1   loratadine (CLARITIN) 10 MG tablet Take 10 mg by mouth daily.     magnesium oxide (MAG-OX) 400 (241.3 Mg) MG tablet Take 1 tablet (400 mg total) by mouth 2 (two) times daily. 60 tablet 3   naproxen sodium (ALEVE) 220 MG tablet Take 440 mg by mouth 3 (three) times daily as needed (pain.).     ondansetron (ZOFRAN-ODT) 4 MG disintegrating tablet Take 1 tablet (4 mg total) by mouth every 6 (six) hours as needed for nausea. 20 tablet 0   potassium chloride (KLOR-CON) 10 MEQ tablet TAKE 1 TABLET BY MOUTH EVERY DAY (Patient taking differently: Take 10 mEq by mouth daily.) 90 tablet 6   sertraline (ZOLOFT) 50 MG tablet Take 1 tablet (50 mg total) by mouth daily. 90 tablet  1   No current facility-administered medications for this visit.     PHYSICAL EXAMINATION: Performance status (ECOG): 0 - Asymptomatic  Vitals:   07/05/21 1000  BP: 123/75  Resp: 18  Temp: (!) 97 F (36.1 C)  SpO2: 98%   Wt Readings from Last 3 Encounters:  07/05/21 166 lb 6.4 oz (75.5 kg)  06/18/21 165 lb 9.6 oz (75.1 kg)  06/06/21 166 lb (75.3 kg)   Physical exam: Vitals reviewed. Awake alert, oriented.     LABORATORY DATA:  I have reviewed the data as listed CMP Latest Ref Rng & Units 07/05/2021 06/05/2021 05/02/2021  Glucose 70 - 99 mg/dL 217(H) 191(H) 124(H)  BUN 6 - 20 mg/dL $Remove'15 14 12  'uaSsDjg$ Creatinine 0.44 - 1.00 mg/dL 1.03(H) 0.96 1.00  Sodium 135 - 145 mmol/L 137 135 137  Potassium 3.5 - 5.1 mmol/L 3.9 3.7 3.7  Chloride 98 - 111 mmol/L 102 101 102  CO2 22 - 32 mmol/L $RemoveB'25 25 26  'RSqkCkut$ Calcium 8.9 - 10.3 mg/dL 9.4 8.8(L) 9.5  Total Protein 6.5 - 8.1 g/dL 7.2 7.0 7.9  Total Bilirubin 0.3 - 1.2 mg/dL 0.5 0.5 0.5  Alkaline Phos 38 - 126 U/L 96 93 94  AST 15 - 41 U/L 41 29 27  ALT 0 - 44 U/L 40 32 31   Lab Results  Component Value Date   CAN153 17.6 07/05/2020   Lab Results  Component Value Date   WBC 5.2 07/05/2021   HGB 12.7 07/05/2021   HCT 37.3 07/05/2021   MCV 93.5 07/05/2021   PLT 176 07/05/2021   NEUTROABS 3.6 07/05/2021    ASSESSMENT:  1.  Triple negative left breast cancer (T2N1): -Patient identified left breast mass with left nipple retraction about 2 months ago. -Mammogram on 06/13/2020 showed mass in the retroareolar 12 o'clock position in the left breast.  2 suspicious left axillary lymph nodes, largest measuring 2 cm. -Breast ultrasound showed 2.7 x 1.6 x 2.2 cm mass in the 12:00 retroareolar position.  Just lateral to the mass is a calcified hypoechoic nodule that corresponds to densely calcified degenerating fibroadenoma. -Left breast 12:00 biopsy showed invasive ductal carcinoma, grade 2/3.  Left axillary lymph node biopsy was consistent with meta  stasis. -ER negative, PR negative and Ki-67 15%.  HER-2 equivocal by IHC.  HER-2 FISH negative. -MRI of the breast on 07/19/2020 shows 4.2 cm enhancing mass in the subareolar left breast, enhancement extending to the level of the left nipple with associated nipple retraction and periareolar skin thickening.  6 morphologically abnormal level 1 left axillary lymph nodes.  No evidence of malignancy in the right breast. -CT CAP on 07/14/2020 shows dominant left axillary nodes measuring 1.8 cm short axis.  Small left subpectoral nodes measuring up to 7 mm short axis, suspicious.  Small mediastinal lymph nodes measuring 7 to 9 mm, thought to be reactive.  No other evidence of metastatic disease. -Neoadjuvant Dose dense AC started on 08/01/2020. -11 cycles of carboplatin and Taxol she completed on 12/20/2020. -Left mastectomy and axillary lymph node biopsy on 02/09/2021-pathology with 0.2 cm grade 2 IDC, margins negative, 0/12 lymph nodes involved, [id number]. - Adjuvant Xeloda 3 tablets twice daily 2 weeks on 1 week off started on 03/20/2021.  Cycle 2 on 04/11/2021.   2.  Family history: -Father had gastroesophageal junction cancer.  Maternal grandmother had throat cancer and paternal aunt had breast cancer. -[facility] genetics testing showed MSH2 VUS.   3.  High risk drug monitoring: -Echo on 07/12/2020 shows EF 60 to 65%.   PLAN:  1.  T2N1 left breast TNBC: - She started cycle 6 of Xeloda 1500 mg twice daily 2 weeks on, 1 week off on 07/04/2021. - She reports tolerating it well and denies any signs or symptoms of hand-foot skin reaction.  No GI symptoms. - Reviewed labs today which showed normal LFTs.  Creatinine is mildly elevated at 1.03.  CBC was normal. - She will continue Xeloda for 2 weeks. - We will schedule her for right breast mammogram prior to next visit. - She reportedly started radiation therapy 3 weeks ago.  She noticed mild fatigue since start of radiation.  Also reported some soreness in the  left breast area.   2.  Family history: - Germline mutation testing was negative.   3.  Hypokalemia: - Continue potassium 10 mEq daily.  Potassium is 3.9.   4.  Hypomagnesemia: - Continue magnesium twice daily.  Magnesium is 1.8.  5.  Diabetes: - Hemoglobin A1c on 05/02/2021 was 8.7.  Today blood sugar is 217. - She was told to follow-up with Dr.  [place] for further management. - I will make a referral to dietitian for education.   Breast Cancer therapy associated bone loss: I have recommended calcium, Vitamin D and weight bearing exercises.  Orders placed this encounter:  Orders Placed This Encounter  Procedures   MM DIAG BREAST TOMO UNI RIGHT   CBC with Differential/Platelet   Comprehensive metabolic panel   VITAMIN D 25 Hydroxy (Vit-D Deficiency, Fractures)   I have provided 30 minutes of non-face-to-face time during this encounter.  The patient has a good understanding of the overall plan. She agrees with it. She will call with any problems that may develop before the next visit here.  Derek Jack, MD [place] [phone]

## 2021-07-05 NOTE — Patient Instructions (Addendum)
[place] at [facility] Discharge Instructions  You were seen by Dr. Delton Coombes today.  Dr. Delton Coombes reviewed your labs and they look okay except for your sugar is elevated. You need to follow up with Dr. Wolfgang Phoenix for your sugar as soon as possible. Start taking over the counter Vitamin D and Calcium daily. Continue taking the Xeloda and getting your radiation treatments.  Dr. Delton Coombes will get you scheduled for your mammogram and a dietitian consult. Continue to get your yearly mammograms.  Please follow up as scheduled.   Thank you for choosing [place] at [facility] to provide your oncology and hematology care.  To afford each patient quality time with our provider, please arrive at least 15 minutes before your scheduled appointment time.   If you have a lab appointment with the [facility] please come in thru the Main Entrance and check in at the main information desk.  You need to re-schedule your appointment should you arrive 10 or more minutes late.  We strive to give you quality time with our providers, and arriving late affects you and other patients whose appointments are after yours.  Also, if you no show three or more times for appointments you may be dismissed from the clinic at the providers discretion.     Again, thank you for choosing [facility].  Our hope is that these requests will decrease the amount of time that you wait before being seen by our physicians.       _____________________________________________________________  Should you have questions after your visit to [facility], please contact our office at [phone] and follow the prompts.  Our office hours are 8:00 a.m. and 4:30 p.m. Monday - Friday.  Please note that voicemails left after 4:00 p.m. may not be returned until the following business day.  We are closed weekends and major holidays.  You do have access to a nurse 24-7, just  call the main number to the clinic [phone] and do not press any options, hold on the line and a nurse will answer the phone.    For prescription refill requests, have your pharmacy contact our office and allow 72 hours.    Due to Covid, you will need to wear a mask upon entering the hospital. If you do not have a mask, a mask will be given to you at the Main Entrance upon arrival. For doctor visits, patients may have 1 support person age 44 or older with them. For treatment visits, patients can not have anyone with them due to social distancing guidelines and our immunocompromised population.

## 2021-07-06 ENCOUNTER — Ambulatory Visit
Admission: RE | Admit: 2021-07-06 | Discharge: 2021-07-06 | Disposition: A | Discharge status: Home / Self Care | Payer: 59 | Source: Ambulatory Visit | Attending: Radiation Oncology | Admitting: Radiation Oncology

## 2021-07-06 ENCOUNTER — Ambulatory Visit: Payer: 59

## 2021-07-06 DIAGNOSIS — Z510 Encounter for antineoplastic radiation therapy: Secondary | ICD-10-CM | POA: Diagnosis not present

## 2021-07-09 ENCOUNTER — Other Ambulatory Visit: Payer: Self-pay

## 2021-07-09 ENCOUNTER — Ambulatory Visit (HOSPITAL_COMMUNITY): Payer: 59 | Admitting: Hematology

## 2021-07-09 ENCOUNTER — Other Ambulatory Visit (HOSPITAL_COMMUNITY): Payer: 59

## 2021-07-09 ENCOUNTER — Ambulatory Visit: Payer: 59

## 2021-07-09 ENCOUNTER — Ambulatory Visit (HOSPITAL_COMMUNITY): Payer: 59

## 2021-07-09 ENCOUNTER — Ambulatory Visit
Admission: RE | Admit: 2021-07-09 | Discharge: 2021-07-09 | Disposition: A | Discharge status: Home / Self Care | Payer: 59 | Source: Ambulatory Visit | Attending: Radiation Oncology | Admitting: Radiation Oncology

## 2021-07-09 DIAGNOSIS — Z510 Encounter for antineoplastic radiation therapy: Secondary | ICD-10-CM | POA: Diagnosis not present

## 2021-07-10 ENCOUNTER — Ambulatory Visit
Admission: RE | Admit: 2021-07-10 | Discharge: 2021-07-10 | Disposition: A | Discharge status: Home / Self Care | Payer: 59 | Source: Ambulatory Visit | Attending: Radiation Oncology | Admitting: Radiation Oncology

## 2021-07-10 ENCOUNTER — Ambulatory Visit: Payer: 59

## 2021-07-10 DIAGNOSIS — Z510 Encounter for antineoplastic radiation therapy: Secondary | ICD-10-CM | POA: Diagnosis not present

## 2021-07-11 ENCOUNTER — Ambulatory Visit: Payer: 59

## 2021-07-11 ENCOUNTER — Other Ambulatory Visit: Payer: Self-pay

## 2021-07-11 ENCOUNTER — Ambulatory Visit
Admission: RE | Admit: 2021-07-11 | Discharge: 2021-07-11 | Disposition: A | Discharge status: Home / Self Care | Payer: 59 | Source: Ambulatory Visit | Attending: Radiation Oncology | Admitting: Radiation Oncology

## 2021-07-11 DIAGNOSIS — Z510 Encounter for antineoplastic radiation therapy: Secondary | ICD-10-CM | POA: Diagnosis not present

## 2021-07-12 ENCOUNTER — Ambulatory Visit
Admission: RE | Admit: 2021-07-12 | Discharge: 2021-07-12 | Disposition: A | Discharge status: Home / Self Care | Payer: 59 | Source: Ambulatory Visit | Attending: Radiation Oncology | Admitting: Radiation Oncology

## 2021-07-12 ENCOUNTER — Ambulatory Visit: Payer: 59

## 2021-07-12 DIAGNOSIS — Z510 Encounter for antineoplastic radiation therapy: Secondary | ICD-10-CM | POA: Diagnosis not present

## 2021-07-13 ENCOUNTER — Ambulatory Visit
Admission: RE | Admit: 2021-07-13 | Discharge: 2021-07-13 | Disposition: A | Discharge status: Home / Self Care | Payer: 59 | Source: Ambulatory Visit | Attending: Radiation Oncology | Admitting: Radiation Oncology

## 2021-07-13 ENCOUNTER — Other Ambulatory Visit: Payer: Self-pay

## 2021-07-13 ENCOUNTER — Ambulatory Visit: Payer: 59

## 2021-07-13 DIAGNOSIS — Z510 Encounter for antineoplastic radiation therapy: Secondary | ICD-10-CM | POA: Diagnosis not present

## 2021-07-16 ENCOUNTER — Ambulatory Visit
Admission: RE | Admit: 2021-07-16 | Discharge: 2021-07-16 | Disposition: A | Discharge status: Home / Self Care | Payer: 59 | Source: Ambulatory Visit | Attending: Radiation Oncology | Admitting: Radiation Oncology

## 2021-07-16 ENCOUNTER — Ambulatory Visit: Payer: 59

## 2021-07-16 ENCOUNTER — Other Ambulatory Visit: Payer: Self-pay

## 2021-07-16 DIAGNOSIS — Z510 Encounter for antineoplastic radiation therapy: Secondary | ICD-10-CM | POA: Diagnosis not present

## 2021-07-17 ENCOUNTER — Ambulatory Visit
Admission: RE | Admit: 2021-07-17 | Discharge: 2021-07-17 | Disposition: A | Discharge status: Home / Self Care | Payer: 59 | Source: Ambulatory Visit | Attending: Radiation Oncology | Admitting: Radiation Oncology

## 2021-07-17 ENCOUNTER — Ambulatory Visit: Payer: 59

## 2021-07-17 DIAGNOSIS — Z510 Encounter for antineoplastic radiation therapy: Secondary | ICD-10-CM | POA: Diagnosis not present

## 2021-07-18 ENCOUNTER — Other Ambulatory Visit: Payer: Self-pay

## 2021-07-18 ENCOUNTER — Ambulatory Visit
Admission: RE | Admit: 2021-07-18 | Discharge: 2021-07-18 | Disposition: A | Discharge status: Home / Self Care | Payer: 59 | Source: Ambulatory Visit | Attending: Radiation Oncology | Admitting: Radiation Oncology

## 2021-07-18 ENCOUNTER — Ambulatory Visit: Payer: 59

## 2021-07-18 DIAGNOSIS — Z510 Encounter for antineoplastic radiation therapy: Secondary | ICD-10-CM | POA: Diagnosis not present

## 2021-07-19 ENCOUNTER — Ambulatory Visit: Payer: 59

## 2021-07-19 ENCOUNTER — Other Ambulatory Visit: Payer: Self-pay

## 2021-07-19 ENCOUNTER — Ambulatory Visit
Admission: RE | Admit: 2021-07-19 | Discharge: 2021-07-19 | Disposition: A | Discharge status: Home / Self Care | Payer: 59 | Source: Ambulatory Visit | Attending: Radiation Oncology | Admitting: Radiation Oncology

## 2021-07-19 DIAGNOSIS — Z510 Encounter for antineoplastic radiation therapy: Secondary | ICD-10-CM | POA: Diagnosis not present

## 2021-07-20 ENCOUNTER — Other Ambulatory Visit: Payer: Self-pay

## 2021-07-20 ENCOUNTER — Ambulatory Visit
Admission: RE | Admit: 2021-07-20 | Discharge: 2021-07-20 | Disposition: A | Discharge status: Home / Self Care | Payer: 59 | Source: Ambulatory Visit | Attending: Radiation Oncology | Admitting: Radiation Oncology

## 2021-07-20 ENCOUNTER — Ambulatory Visit: Payer: 59 | Admitting: Radiation Oncology

## 2021-07-20 ENCOUNTER — Ambulatory Visit: Payer: 59

## 2021-07-20 DIAGNOSIS — Z510 Encounter for antineoplastic radiation therapy: Secondary | ICD-10-CM | POA: Diagnosis not present

## 2021-07-23 ENCOUNTER — Other Ambulatory Visit: Payer: Self-pay

## 2021-07-23 ENCOUNTER — Ambulatory Visit
Admission: RE | Admit: 2021-07-23 | Discharge: 2021-07-23 | Disposition: A | Discharge status: Home / Self Care | Payer: 59 | Source: Ambulatory Visit | Attending: Radiation Oncology | Admitting: Radiation Oncology

## 2021-07-23 ENCOUNTER — Ambulatory Visit: Payer: 59

## 2021-07-23 DIAGNOSIS — Z510 Encounter for antineoplastic radiation therapy: Secondary | ICD-10-CM | POA: Diagnosis not present

## 2021-07-24 ENCOUNTER — Other Ambulatory Visit: Payer: Self-pay

## 2021-07-24 ENCOUNTER — Ambulatory Visit
Admission: RE | Admit: 2021-07-24 | Discharge: 2021-07-24 | Disposition: A | Discharge status: Home / Self Care | Payer: 59 | Source: Ambulatory Visit | Attending: Radiation Oncology | Admitting: Radiation Oncology

## 2021-07-24 ENCOUNTER — Ambulatory Visit: Payer: 59

## 2021-07-24 ENCOUNTER — Other Ambulatory Visit (HOSPITAL_COMMUNITY): Payer: Self-pay | Admitting: Hematology

## 2021-07-24 DIAGNOSIS — Z510 Encounter for antineoplastic radiation therapy: Secondary | ICD-10-CM | POA: Diagnosis not present

## 2021-07-25 ENCOUNTER — Ambulatory Visit: Payer: 59

## 2021-07-25 ENCOUNTER — Encounter (HOSPITAL_COMMUNITY): Payer: Self-pay | Admitting: Hematology

## 2021-07-25 ENCOUNTER — Encounter (HOSPITAL_COMMUNITY): Payer: Self-pay | Admitting: Hematology and Oncology

## 2021-07-26 ENCOUNTER — Other Ambulatory Visit: Payer: Self-pay

## 2021-07-26 ENCOUNTER — Ambulatory Visit
Admission: RE | Admit: 2021-07-26 | Discharge: 2021-07-26 | Disposition: A | Discharge status: Home / Self Care | Payer: 59 | Source: Ambulatory Visit | Attending: Radiation Oncology | Admitting: Radiation Oncology

## 2021-07-26 ENCOUNTER — Ambulatory Visit: Payer: 59

## 2021-07-26 DIAGNOSIS — Z510 Encounter for antineoplastic radiation therapy: Secondary | ICD-10-CM | POA: Diagnosis not present

## 2021-07-27 ENCOUNTER — Other Ambulatory Visit: Payer: Self-pay

## 2021-07-27 ENCOUNTER — Ambulatory Visit: Payer: 59

## 2021-07-27 ENCOUNTER — Ambulatory Visit: Payer: 59 | Admitting: Radiation Oncology

## 2021-07-27 ENCOUNTER — Ambulatory Visit
Admission: RE | Admit: 2021-07-27 | Discharge: 2021-07-27 | Disposition: A | Discharge status: Home / Self Care | Payer: 59 | Source: Ambulatory Visit | Attending: Radiation Oncology | Admitting: Radiation Oncology

## 2021-07-27 DIAGNOSIS — Z510 Encounter for antineoplastic radiation therapy: Secondary | ICD-10-CM | POA: Diagnosis not present

## 2021-07-30 ENCOUNTER — Ambulatory Visit: Payer: 59

## 2021-07-30 ENCOUNTER — Ambulatory Visit
Admission: RE | Admit: 2021-07-30 | Discharge: 2021-07-30 | Disposition: A | Discharge status: Home / Self Care | Payer: 59 | Source: Ambulatory Visit | Attending: Radiation Oncology | Admitting: Radiation Oncology

## 2021-07-30 ENCOUNTER — Other Ambulatory Visit: Payer: Self-pay

## 2021-07-30 DIAGNOSIS — Z510 Encounter for antineoplastic radiation therapy: Secondary | ICD-10-CM | POA: Insufficient documentation

## 2021-07-30 DIAGNOSIS — C773 Secondary and unspecified malignant neoplasm of axilla and upper limb lymph nodes: Secondary | ICD-10-CM | POA: Diagnosis not present

## 2021-07-30 DIAGNOSIS — Z171 Estrogen receptor negative status [ER-]: Secondary | ICD-10-CM | POA: Diagnosis not present

## 2021-07-30 DIAGNOSIS — E876 Hypokalemia: Secondary | ICD-10-CM | POA: Diagnosis not present

## 2021-07-30 DIAGNOSIS — C50012 Malignant neoplasm of nipple and areola, left female breast: Secondary | ICD-10-CM | POA: Insufficient documentation

## 2021-07-30 DIAGNOSIS — E119 Type 2 diabetes mellitus without complications: Secondary | ICD-10-CM | POA: Diagnosis not present

## 2021-07-30 DIAGNOSIS — Z9012 Acquired absence of left breast and nipple: Secondary | ICD-10-CM | POA: Diagnosis not present

## 2021-07-31 ENCOUNTER — Ambulatory Visit
Admission: RE | Admit: 2021-07-31 | Discharge: 2021-07-31 | Disposition: A | Discharge status: Home / Self Care | Payer: 59 | Source: Ambulatory Visit | Attending: Radiation Oncology | Admitting: Radiation Oncology

## 2021-07-31 ENCOUNTER — Ambulatory Visit: Payer: 59 | Admitting: Radiation Oncology

## 2021-07-31 ENCOUNTER — Ambulatory Visit: Payer: 59

## 2021-07-31 DIAGNOSIS — Z510 Encounter for antineoplastic radiation therapy: Secondary | ICD-10-CM | POA: Diagnosis not present

## 2021-08-01 ENCOUNTER — Ambulatory Visit
Admission: RE | Admit: 2021-08-01 | Discharge: 2021-08-01 | Disposition: A | Discharge status: Home / Self Care | Payer: 59 | Source: Ambulatory Visit | Attending: Radiation Oncology | Admitting: Radiation Oncology

## 2021-08-01 ENCOUNTER — Other Ambulatory Visit: Payer: Self-pay

## 2021-08-01 ENCOUNTER — Ambulatory Visit: Payer: 59

## 2021-08-01 DIAGNOSIS — Z510 Encounter for antineoplastic radiation therapy: Secondary | ICD-10-CM | POA: Diagnosis not present

## 2021-08-02 ENCOUNTER — Ambulatory Visit
Admission: RE | Admit: 2021-08-02 | Discharge: 2021-08-02 | Disposition: A | Discharge status: Home / Self Care | Payer: 59 | Source: Ambulatory Visit | Attending: Radiation Oncology | Admitting: Radiation Oncology

## 2021-08-02 ENCOUNTER — Ambulatory Visit: Payer: 59

## 2021-08-02 ENCOUNTER — Other Ambulatory Visit (HOSPITAL_COMMUNITY): Payer: Self-pay | Admitting: Hematology

## 2021-08-02 DIAGNOSIS — Z510 Encounter for antineoplastic radiation therapy: Secondary | ICD-10-CM | POA: Diagnosis not present

## 2021-08-02 DIAGNOSIS — Z1231 Encounter for screening mammogram for malignant neoplasm of breast: Secondary | ICD-10-CM

## 2021-08-03 ENCOUNTER — Ambulatory Visit: Payer: 59

## 2021-08-03 ENCOUNTER — Ambulatory Visit
Admission: RE | Admit: 2021-08-03 | Discharge: 2021-08-03 | Disposition: A | Discharge status: Home / Self Care | Payer: 59 | Source: Ambulatory Visit | Attending: Radiation Oncology | Admitting: Radiation Oncology

## 2021-08-03 ENCOUNTER — Other Ambulatory Visit: Payer: Self-pay

## 2021-08-03 DIAGNOSIS — Z510 Encounter for antineoplastic radiation therapy: Secondary | ICD-10-CM | POA: Diagnosis not present

## 2021-08-06 ENCOUNTER — Ambulatory Visit: Payer: 59

## 2021-08-06 ENCOUNTER — Ambulatory Visit
Admission: RE | Admit: 2021-08-06 | Discharge: 2021-08-06 | Disposition: A | Discharge status: Home / Self Care | Payer: 59 | Source: Ambulatory Visit | Attending: Radiation Oncology | Admitting: Radiation Oncology

## 2021-08-06 ENCOUNTER — Other Ambulatory Visit: Payer: Self-pay

## 2021-08-06 DIAGNOSIS — Z510 Encounter for antineoplastic radiation therapy: Secondary | ICD-10-CM | POA: Diagnosis not present

## 2021-08-06 NOTE — Progress Notes (Signed)
Elizabeth Graves, [place] [id number]   CLINIC:  Medical Oncology/Hematology  PCP:  Kathyrn Drown, MD [address] [place] / [place] [address] [phone]   REASON FOR VISIT:  Follow-up for left breast cancer  PRIOR THERAPY: none  NGS Results: not done  CURRENT THERAPY: Keytruda Q21D  BRIEF ONCOLOGIC HISTORY:  Oncology History Overview Note  Cancer Staging Triple negative malignant neoplasm of breast ([place]) Staging form: Breast, AJCC 8th Edition - Clinical stage from 06/20/2020: Stage IIIB (cT2, cN1, cM0, G2, ER-, PR-, HER2-) - Signed by Truitt Merle, MD on 06/05/2021 Stage prefix: Initial diagnosis Histologic grading system: 3 grade system - Pathologic stage from 02/09/2021: No Stage Recommended (ypT1a, pN0, cM0, G2, ER-, PR-, HER2-) - Signed by Truitt Merle, MD on 06/05/2021 Stage prefix: Post-therapy Response to neoadjuvant therapy: Partial response Histologic grading system: 3 grade system    Triple negative malignant neoplasm of breast ([place])  06/20/2020 Cancer Staging   Staging form: Breast, AJCC 8th Edition - Clinical stage from 06/20/2020: Stage IIIB (cT2, cN1, cM0, G2, ER-, PR-, HER2-) - Signed by Truitt Merle, MD on 06/05/2021  Stage prefix: Initial diagnosis  Histologic grading system: 3 grade system    07/05/2020 Initial Diagnosis   Triple negative malignant neoplasm of breast ([place])    08/01/2020 - 12/20/2020 Chemotherapy      Patient is on Antibody Plan: HEAD/NECK PEMBROLIZUMAB Q21D     08/17/2020 Genetic Testing   Negative genetic testing:  No pathogenic variants detected on the [facility] CustomNext-Cancer + RNAinsight panel. A variant of uncertain significance (VUS) was detected in the MSH2 gene called c.1600C>T (p.R534C). The report date is 08/17/2020.  The CustomNext-Cancer+RNAinsight panel offered by [facility] includes sequencing and rearrangement analysis for up to 91 genes, which included the following 47 genes for Ms. Mcwhirter:   APC*, ATM*, AXIN2, BARD1, BMPR1A, BRCA1*, BRCA2*, BRIP1*, CDH1*, CDK4, CDKN2A, CHEK2*, DICER1, EPCAM, GREM1, HOXB13, MEN1, MLH1*, MSH2*, MSH3, MSH6*, MUTYH*, NBN, NF1*, NF2, NTHL1, PALB2*, PMS2*, POLD1, POLE, PTEN*, RAD51C*, RAD51D*, RECQL, RET, SDHA, SDHAF2, SDHB, SDHC, SDHD, SMAD4, SMARCA4, STK11, TP53*, TSC1, TSC2, and VHL.  DNA and RNA analyses performed for * genes.     02/09/2021 Cancer Staging   Staging form: Breast, AJCC 8th Edition - Pathologic stage from 02/09/2021: No Stage Recommended (ypT1a, pN0, cM0, G2, ER-, PR-, HER2-) - Signed by Truitt Merle, MD on 06/05/2021  Stage prefix: Post-therapy  Response to neoadjuvant therapy: Partial response  Histologic grading system: 3 grade system    06/18/2021 -  Chemotherapy      Patient is on Antibody Plan: HEAD/NECK PEMBROLIZUMAB Q21D     Drug-induced neutropenia ([place])    CANCER STAGING: Cancer Staging Triple negative malignant neoplasm of breast ([place]) Staging form: Breast, AJCC 8th Edition - Clinical stage from 06/20/2020: Stage IIIB (cT2, cN1, cM0, G2, ER-, PR-, HER2-) - Signed by Truitt Merle, MD on 06/05/2021 - Pathologic stage from 02/09/2021: No Stage Recommended (ypT1a, pN0, cM0, G2, ER-, PR-, HER2-) - Signed by Truitt Merle, MD on 06/05/2021   INTERVAL HISTORY:  Elizabeth Graves, a 55 y.o. female, returns for routine follow-up. [name] was last seen on 07/05/21.  Overall, she tells me she has been feeling pretty well. She reports skin breakdown which is causing her constant pain at the site she was receiving radiation; she is treating it with neosporin at the recommendation of radiation oncology. She denies any new pains.  REVIEW OF SYSTEMS:  Review of Systems  Constitutional:  Negative for appetite change and fatigue.  Skin:  Positive for wound (skin breakdown L under arm 5/10 pain).  All other systems reviewed and are negative.  PAST MEDICAL/SURGICAL HISTORY:  Past Medical History:  Diagnosis Date   Asthma    Breast cancer  (HCC)    Cancer (HCC)    cervical - 20 yrs ago   Carpal tunnel syndrome    Depression    Family history of breast cancer    Family history of lung cancer    Hypertension    Hypokalemia    tx w/ k-Dur   Port-A-Cath in place 07/24/2020   Seasonal allergies    tx with benadryl prn   SVD (spontaneous vaginal delivery)    x 2   Past Surgical History:  Procedure Laterality Date   ABDOMINAL HYSTERECTOMY     CO2 Laser of Cervix     for cervical cancer 20 yrs ago   DILATION AND CURETTAGE OF UTERUS  12/2010   polyp removed   DILATION AND CURETTAGE OF UTERUS     x 3 for MAB   LAPAROSCOPIC ASSISTED VAGINAL HYSTERECTOMY  06/09/2012   Procedure: LAPAROSCOPIC ASSISTED VAGINAL HYSTERECTOMY;  Surgeon: Miguel Aschoff, MD;  Location: [facility];  Service: Gynecology;  Laterality: N/A;   MASTECTOMY MODIFIED RADICAL Left 02/09/2021   Procedure: MASTECTOMY MODIFIED RADICAL;  Surgeon: Lucretia Roers, MD;  Location: [facility];  Service: General;  Laterality: Left;   PORTACATH PLACEMENT Right 07/17/2020   Procedure: INSERTION PORT-A-CATH;  Surgeon: Lucretia Roers, MD;  Location: [facility];  Service: General;  Laterality: Right;   SALPINGOOPHORECTOMY  06/09/2012   Procedure: SALPINGO OOPHERECTOMY;  Surgeon: Miguel Aschoff, MD;  Location: [facility];  Service: Gynecology;  Laterality: Bilateral;   TUBAL LIGATION      SOCIAL HISTORY:  Social History   Socioeconomic History   Marital status: Married    Spouse name: Not on file   Number of children: 2   Years of education: Not on file   Highest education level: Not on file  Occupational History   Occupation: EMPLOYED  Tobacco Use   Smoking status: Former    Packs/day: 1.00    Years: 16.00    Pack years: 16.00    Types: Cigarettes    Start date: 05/30/1994    Quit date: 01/31/2020    Years since quitting: 1.5   Smokeless tobacco: Never  Vaping Use   Vaping Use: Never used  Substance and Sexual Activity   Alcohol use: No   Drug use: No   Sexual activity: Yes     Birth control/protection: Surgical  Other Topics Concern   Not on file  Social History Narrative   Not on file   Social Determinants of Health   Financial Resource Strain: Not on file  Food Insecurity: Not on file  Transportation Needs: Not on file  Physical Activity: Not on file  Stress: Not on file  Social Connections: Not on file  Intimate Partner Violence: Not At Risk   Fear of Current or Ex-Partner: No   Emotionally Abused: No   Physically Abused: No   Sexually Abused: No    FAMILY HISTORY:  Family History  Problem Relation Age of Onset   Heart disease Mother    Lung cancer Mother 73       smoker   Heart disease Father    Esophageal cancer Father        GE junction, dx. in his early 67s  Diverticulitis Sister    Diabetes Sister    Diabetes Maternal Grandmother    Heart disease Maternal Grandmother    Throat cancer Maternal Grandmother        dx. late 7s   Healthy Sister    Healthy Daughter    Healthy Son    Breast cancer Paternal Aunt        limited info    CURRENT MEDICATIONS:  Current Outpatient Medications  Medication Sig Dispense Refill   albuterol (VENTOLIN HFA) 108 (90 Base) MCG/ACT inhaler TAKE 2 PUFFS BY MOUTH EVERY 6 HOURS AS NEEDED FOR WHEEZE OR SHORTNESS OF BREATH (Patient taking differently: Inhale 2 puffs into the lungs every 6 (six) hours as needed.) 8.5 Inhaler 0   atorvastatin (LIPITOR) 20 MG tablet Take 1 tablet (20 mg total) by mouth daily. 90 tablet 1   capecitabine (XELODA) 500 MG tablet Take 3 tablets (1,500 mg total) by mouth 2 (two) times daily after a meal. Take for 14 days, then hold for 7 days. Repeat every 21 days. 84 tablet 2   diphenhydrAMINE (BENADRYL) 25 mg capsule Take 25 mg by mouth every 6 (six) hours as needed for allergies. Allergies.     esomeprazole (NEXIUM) 20 MG capsule Take 20 mg by mouth daily before breakfast.     ibuprofen (ADVIL,MOTRIN) 200 MG tablet Take 400-600 mg by mouth every 8 (eight) hours as needed for  moderate pain (pain.).     lisinopril (ZESTRIL) 5 MG tablet Take 1 tablet (5 mg total) by mouth daily. 90 tablet 1   loratadine (CLARITIN) 10 MG tablet Take 10 mg by mouth daily.     magnesium oxide (MAG-OX) 400 (240 Mg) MG tablet TAKE 1 TABLET BY MOUTH TWICE A DAY 180 tablet 1   naproxen sodium (ALEVE) 220 MG tablet Take 440 mg by mouth 3 (three) times daily as needed (pain.).     ondansetron (ZOFRAN-ODT) 4 MG disintegrating tablet Take 1 tablet (4 mg total) by mouth every 6 (six) hours as needed for nausea. 20 tablet 0   potassium chloride (KLOR-CON) 10 MEQ tablet TAKE 1 TABLET BY MOUTH EVERY DAY (Patient taking differently: Take 10 mEq by mouth daily.) 90 tablet 6   sertraline (ZOLOFT) 50 MG tablet Take 1 tablet (50 mg total) by mouth daily. 90 tablet 1   No current facility-administered medications for this visit.    ALLERGIES:  No Known Allergies  PHYSICAL EXAM:  Performance status (ECOG): 0 - Asymptomatic  There were no vitals filed for this visit. Wt Readings from Last 3 Encounters:  07/05/21 166 lb 6.4 oz (75.5 kg)  06/18/21 165 lb 9.6 oz (75.1 kg)  06/06/21 166 lb (75.3 kg)   Physical Exam Vitals reviewed.  Constitutional:      Appearance: Normal appearance.  Cardiovascular:     Rate and Rhythm: Normal rate and regular rhythm.     Pulses: Normal pulses.     Heart sounds: Normal heart sounds.  Pulmonary:     Effort: Pulmonary effort is normal.     Breath sounds: Normal breath sounds.  Skin:    Comments: Skin breakdown in left axillary  Neurological:     General: No focal deficit present.     Mental Status: She is alert and oriented to person, place, and time.  Psychiatric:        Mood and Affect: Mood normal.        Behavior: Behavior normal.    LABORATORY DATA:  I have reviewed the labs  as listed.  CBC Latest Ref Rng & Units 07/05/2021 06/05/2021 05/02/2021  WBC 4.0 - 10.5 K/uL 5.2 6.7 9.1  Hemoglobin 12.0 - 15.0 g/dL 12.7 11.9(L) 13.5  Hematocrit 36.0 - 46.0 %  37.3 36.3 41.1  Platelets 150 - 400 K/uL 176 205 239   CMP Latest Ref Rng & Units 07/05/2021 06/05/2021 05/02/2021  Glucose 70 - 99 mg/dL 217(H) 191(H) 124(H)  BUN 6 - 20 mg/dL $Remove'15 14 12  'fhaLoVv$ Creatinine 0.44 - 1.00 mg/dL 1.03(H) 0.96 1.00  Sodium 135 - 145 mmol/L 137 135 137  Potassium 3.5 - 5.1 mmol/L 3.9 3.7 3.7  Chloride 98 - 111 mmol/L 102 101 102  CO2 22 - 32 mmol/L $RemoveB'25 25 26  'pXrMWkVB$ Calcium 8.9 - 10.3 mg/dL 9.4 8.8(L) 9.5  Total Protein 6.5 - 8.1 g/dL 7.2 7.0 7.9  Total Bilirubin 0.3 - 1.2 mg/dL 0.5 0.5 0.5  Alkaline Phos 38 - 126 U/L 96 93 94  AST 15 - 41 U/L 41 29 27  ALT 0 - 44 U/L 40 32 31    DIAGNOSTIC IMAGING:  I have independently reviewed the scans and discussed with the patient. No results found.   ASSESSMENT:  1.  Triple negative left breast cancer (T2N1): -Patient identified left breast mass with left nipple retraction about 2 months ago. -Mammogram on 06/13/2020 showed mass in the retroareolar 12 o'clock position in the left breast.  2 suspicious left axillary lymph nodes, largest measuring 2 cm. -Breast ultrasound showed 2.7 x 1.6 x 2.2 cm mass in the 12:00 retroareolar position.  Just lateral to the mass is a calcified hypoechoic nodule that corresponds to densely calcified degenerating fibroadenoma. -Left breast 12:00 biopsy showed invasive ductal carcinoma, grade 2/3.  Left axillary lymph node biopsy was consistent with meta stasis. -ER negative, PR negative and Ki-67 15%.  HER-2 equivocal by IHC.  HER-2 FISH negative. -MRI of the breast on 07/19/2020 shows 4.2 cm enhancing mass in the subareolar left breast, enhancement extending to the level of the left nipple with associated nipple retraction and periareolar skin thickening.  6 morphologically abnormal level 1 left axillary lymph nodes.  No evidence of malignancy in the right breast. -CT CAP on 07/14/2020 shows dominant left axillary nodes measuring 1.8 cm short axis.  Small left subpectoral nodes measuring up to 7 mm short axis,  suspicious.  Small mediastinal lymph nodes measuring 7 to 9 mm, thought to be reactive.  No other evidence of metastatic disease. -Neoadjuvant Dose dense AC started on 08/01/2020. -11 cycles of carboplatin and Taxol she completed on 12/20/2020. -Left mastectomy and axillary lymph node biopsy on 02/09/2021-pathology with 0.2 cm grade 2 IDC, margins negative, 0/12 lymph nodes involved, [id number]. - 6 cycles of adjuvant capecitabine from 03/20/2021 through 07/04/2021. [employer] has denied request for pembrolizumab based on keynote study.   2.  Family history: -Father had gastroesophageal junction cancer.  Maternal grandmother had throat cancer and paternal aunt had breast cancer. -[facility] genetics testing showed MSH2 VUS.   3.  High risk drug monitoring: -Echo on 07/12/2020 shows EF 60 to 65%.   PLAN:  1.  T2N1 left breast TNBC: - She has completed 6 cycles of Xeloda. - She is currently doing radiation therapy.  She has skin breakdown in the left axillary region.  She has erythema over the left chest wall. - She is applying Neosporin ointment.  No signs of infection. - Reviewed labs today which showed normal CBC and LFTs.  Mild low vitamin D at 27. -  Recommend follow-up in 4 months with repeat labs. - We will schedule for right breast mammogram on 08/13/2021. - Continue port flush every 3 months.   2.  Family history: - Germline mutation testing was negative.   3.  Hypokalemia: - She is taking potassium 10 mEq daily.  Potassium today is 4.6. - Recommend discontinuing potassium as she completed chemotherapy.   4.  Hypomagnesemia: - She is taking magnesium twice daily.  Her magnesium today is 1.9. - I have told her to finish the bottle of magnesium and stop it.  5.  Diabetes: - Her last HbA1c on 05/02/2021 was 8.7. - Today blood sugar is 144. - She was told to follow-up with Dr. Wolfgang Phoenix for management of diabetes.   Orders placed this encounter:  No orders of the defined types were  placed in this encounter.    Derek Jack, MD [place] [phone]   I, Thana Ates, am acting as a scribe for Dr. Derek Jack.  I, Derek Jack MD, have reviewed the above documentation for accuracy and completeness, and I agree with the above.

## 2021-08-07 ENCOUNTER — Ambulatory Visit (HOSPITAL_COMMUNITY): Payer: 59

## 2021-08-07 ENCOUNTER — Inpatient Hospital Stay (HOSPITAL_BASED_OUTPATIENT_CLINIC_OR_DEPARTMENT_OTHER): Payer: 59 | Admitting: Hematology

## 2021-08-07 ENCOUNTER — Ambulatory Visit: Payer: 59

## 2021-08-07 ENCOUNTER — Other Ambulatory Visit: Payer: Self-pay

## 2021-08-07 ENCOUNTER — Encounter (HOSPITAL_COMMUNITY): Payer: 59

## 2021-08-07 ENCOUNTER — Ambulatory Visit (HOSPITAL_COMMUNITY): Payer: 59 | Admitting: Hematology

## 2021-08-07 ENCOUNTER — Ambulatory Visit
Admission: RE | Admit: 2021-08-07 | Discharge: 2021-08-07 | Disposition: A | Discharge status: Home / Self Care | Payer: 59 | Source: Ambulatory Visit | Attending: Radiation Oncology | Admitting: Radiation Oncology

## 2021-08-07 ENCOUNTER — Other Ambulatory Visit (HOSPITAL_COMMUNITY): Payer: 59

## 2021-08-07 ENCOUNTER — Inpatient Hospital Stay (HOSPITAL_COMMUNITY): Payer: 59 | Attending: Hematology

## 2021-08-07 VITALS — BP 109/69 | HR 80 | Temp 97.2°F | Resp 18 | Wt 164.8 lb

## 2021-08-07 DIAGNOSIS — Z510 Encounter for antineoplastic radiation therapy: Secondary | ICD-10-CM | POA: Diagnosis not present

## 2021-08-07 DIAGNOSIS — Z171 Estrogen receptor negative status [ER-]: Secondary | ICD-10-CM | POA: Diagnosis not present

## 2021-08-07 DIAGNOSIS — C50012 Malignant neoplasm of nipple and areola, left female breast: Secondary | ICD-10-CM

## 2021-08-07 DIAGNOSIS — C50919 Malignant neoplasm of unspecified site of unspecified female breast: Secondary | ICD-10-CM | POA: Diagnosis not present

## 2021-08-07 DIAGNOSIS — E119 Type 2 diabetes mellitus without complications: Secondary | ICD-10-CM | POA: Insufficient documentation

## 2021-08-07 DIAGNOSIS — C773 Secondary and unspecified malignant neoplasm of axilla and upper limb lymph nodes: Secondary | ICD-10-CM | POA: Insufficient documentation

## 2021-08-07 DIAGNOSIS — Z9012 Acquired absence of left breast and nipple: Secondary | ICD-10-CM | POA: Insufficient documentation

## 2021-08-07 DIAGNOSIS — E876 Hypokalemia: Secondary | ICD-10-CM | POA: Insufficient documentation

## 2021-08-07 LAB — CBC WITH DIFFERENTIAL/PLATELET
Abs Immature Granulocytes: 0.02 10*3/uL (ref 0.00–0.07)
Basophils Absolute: 0 10*3/uL (ref 0.0–0.1)
Basophils Relative: 1 %
Eosinophils Absolute: 0.2 10*3/uL (ref 0.0–0.5)
Eosinophils Relative: 4 %
HCT: 40.3 % (ref 36.0–46.0)
Hemoglobin: 13.4 g/dL (ref 12.0–15.0)
Immature Granulocytes: 1 %
Lymphocytes Relative: 17 %
Lymphs Abs: 0.8 10*3/uL (ref 0.7–4.0)
MCH: 32.7 pg (ref 26.0–34.0)
MCHC: 33.3 g/dL (ref 30.0–36.0)
MCV: 98.3 fL (ref 80.0–100.0)
Monocytes Absolute: 0.4 10*3/uL (ref 0.1–1.0)
Monocytes Relative: 10 %
Neutro Abs: 3 10*3/uL (ref 1.7–7.7)
Neutrophils Relative %: 67 %
Platelets: 176 10*3/uL (ref 150–400)
RBC: 4.1 MIL/uL (ref 3.87–5.11)
RDW: 14.8 % (ref 11.5–15.5)
WBC: 4.4 10*3/uL (ref 4.0–10.5)
nRBC: 0 % (ref 0.0–0.2)

## 2021-08-07 LAB — COMPREHENSIVE METABOLIC PANEL
ALT: 39 U/L (ref 0–44)
AST: 34 U/L (ref 15–41)
Albumin: 4.1 g/dL (ref 3.5–5.0)
Alkaline Phosphatase: 95 U/L (ref 38–126)
Anion gap: 9 (ref 5–15)
BUN: 16 mg/dL (ref 6–20)
CO2: 26 mmol/L (ref 22–32)
Calcium: 9.6 mg/dL (ref 8.9–10.3)
Chloride: 104 mmol/L (ref 98–111)
Creatinine, Ser: 0.95 mg/dL (ref 0.44–1.00)
GFR, Estimated: >60 mL/min (ref >60)
Glucose, Bld: 144 mg/dL — ABNORMAL HIGH (ref 70–99)
Potassium: 4.6 mmol/L (ref 3.5–5.1)
Sodium: 139 mmol/L (ref 135–145)
Total Bilirubin: 0.5 mg/dL (ref 0.3–1.2)
Total Protein: 7.2 g/dL (ref 6.5–8.1)

## 2021-08-07 LAB — MAGNESIUM: Magnesium: 1.9 mg/dL (ref 1.7–2.4)

## 2021-08-07 LAB — VITAMIN D 25 HYDROXY (VIT D DEFICIENCY, FRACTURES): Vit D, 25-Hydroxy: 27.99 ng/mL — ABNORMAL LOW (ref 30–100)

## 2021-08-07 NOTE — Patient Instructions (Addendum)
[facility] at [facility] °Discharge Instructions ° °You were seen today by Dr. Katragadda. He went over your recent results. Dr. Katragadda will see you back in 4 months for labs and follow up. ° ° °Thank you for choosing [facility] at [facility] to provide your oncology and hematology care.  To afford each patient quality time with our provider, please arrive at least 15 minutes before your scheduled appointment time.  ° °If you have a lab appointment with the Cancer Center please come in thru the Main Entrance and check in at the main information desk ° °You need to re-schedule your appointment should you arrive 10 or more minutes late.  We strive to give you quality time with our providers, and arriving late affects you and other patients whose appointments are after yours.  Also, if you no show three or more times for appointments you may be dismissed from the clinic at the providers discretion.     °Again, thank you for choosing  Cancer Center.  Our hope is that these requests will decrease the amount of time that you wait before being seen by our physicians.       °_____________________________________________________________ ° °Should you have questions after your visit to  Cancer Center, please contact our office at [phone] between the hours of 8:00 a.m. and 4:30 p.m.  Voicemails left after 4:00 p.m. will not be returned until the following business day.  For prescription refill requests, have your pharmacy contact our office and allow 72 hours.   ° °Cancer Center Support Programs:  ° °> Cancer Support Group  °2nd Tuesday of the month 1pm-2pm, Journey Room  ° ° °

## 2021-08-08 ENCOUNTER — Other Ambulatory Visit: Payer: Self-pay

## 2021-08-08 ENCOUNTER — Ambulatory Visit
Admission: RE | Admit: 2021-08-08 | Discharge: 2021-08-08 | Disposition: A | Discharge status: Home / Self Care | Payer: 59 | Source: Ambulatory Visit | Attending: Radiation Oncology | Admitting: Radiation Oncology

## 2021-08-08 DIAGNOSIS — Z510 Encounter for antineoplastic radiation therapy: Secondary | ICD-10-CM | POA: Diagnosis not present

## 2021-08-09 ENCOUNTER — Ambulatory Visit: Payer: 59

## 2021-08-09 ENCOUNTER — Other Ambulatory Visit: Payer: Self-pay

## 2021-08-09 ENCOUNTER — Ambulatory Visit
Admission: RE | Admit: 2021-08-09 | Discharge: 2021-08-09 | Disposition: A | Discharge status: Home / Self Care | Payer: 59 | Source: Ambulatory Visit | Attending: Radiation Oncology | Admitting: Radiation Oncology

## 2021-08-09 DIAGNOSIS — Z510 Encounter for antineoplastic radiation therapy: Secondary | ICD-10-CM | POA: Diagnosis not present

## 2021-08-10 ENCOUNTER — Encounter: Payer: Self-pay | Admitting: Radiation Oncology

## 2021-08-10 ENCOUNTER — Ambulatory Visit
Admission: RE | Admit: 2021-08-10 | Discharge: 2021-08-10 | Disposition: A | Discharge status: Home / Self Care | Payer: 59 | Source: Ambulatory Visit | Attending: Radiation Oncology | Admitting: Radiation Oncology

## 2021-08-10 DIAGNOSIS — Z510 Encounter for antineoplastic radiation therapy: Secondary | ICD-10-CM | POA: Diagnosis not present

## 2021-08-13 ENCOUNTER — Other Ambulatory Visit: Payer: Self-pay

## 2021-08-13 ENCOUNTER — Ambulatory Visit (HOSPITAL_COMMUNITY)
Admission: RE | Admit: 2021-08-13 | Discharge: 2021-08-13 | Disposition: A | Discharge status: Home / Self Care | Payer: 59 | Source: Ambulatory Visit | Attending: Hematology | Admitting: Hematology

## 2021-08-13 DIAGNOSIS — Z1231 Encounter for screening mammogram for malignant neoplasm of breast: Secondary | ICD-10-CM | POA: Insufficient documentation

## 2021-08-20 ENCOUNTER — Encounter (HOSPITAL_COMMUNITY): Payer: Self-pay | Admitting: Hematology

## 2021-08-20 ENCOUNTER — Encounter (HOSPITAL_COMMUNITY): Payer: Self-pay

## 2021-08-20 ENCOUNTER — Other Ambulatory Visit: Payer: Self-pay

## 2021-08-20 ENCOUNTER — Inpatient Hospital Stay (HOSPITAL_COMMUNITY): Payer: 59

## 2021-08-20 ENCOUNTER — Encounter (HOSPITAL_COMMUNITY): Payer: Self-pay | Admitting: Hematology and Oncology

## 2021-08-20 DIAGNOSIS — Z510 Encounter for antineoplastic radiation therapy: Secondary | ICD-10-CM | POA: Diagnosis not present

## 2021-08-20 IMAGING — MG MM DIGITAL SCREENING UNILAT*R* W/ TOMO W/ CAD
4 series · 4 of 12 positions shown · non-contrast
Comparison: Previous exam(s).

CLINICAL DATA: Screening.

EXAM:
DIGITAL SCREENING UNILATERAL RIGHT MAMMOGRAM WITH CAD AND
TOMOSYNTHESIS
TECHNIQUE: Right screening digital craniocaudal and mediolateral oblique
mammograms were obtained. Right screening digital breast
tomosynthesis was performed. The images were evaluated with
computer-aided detection.

[R MLO synth-2D]
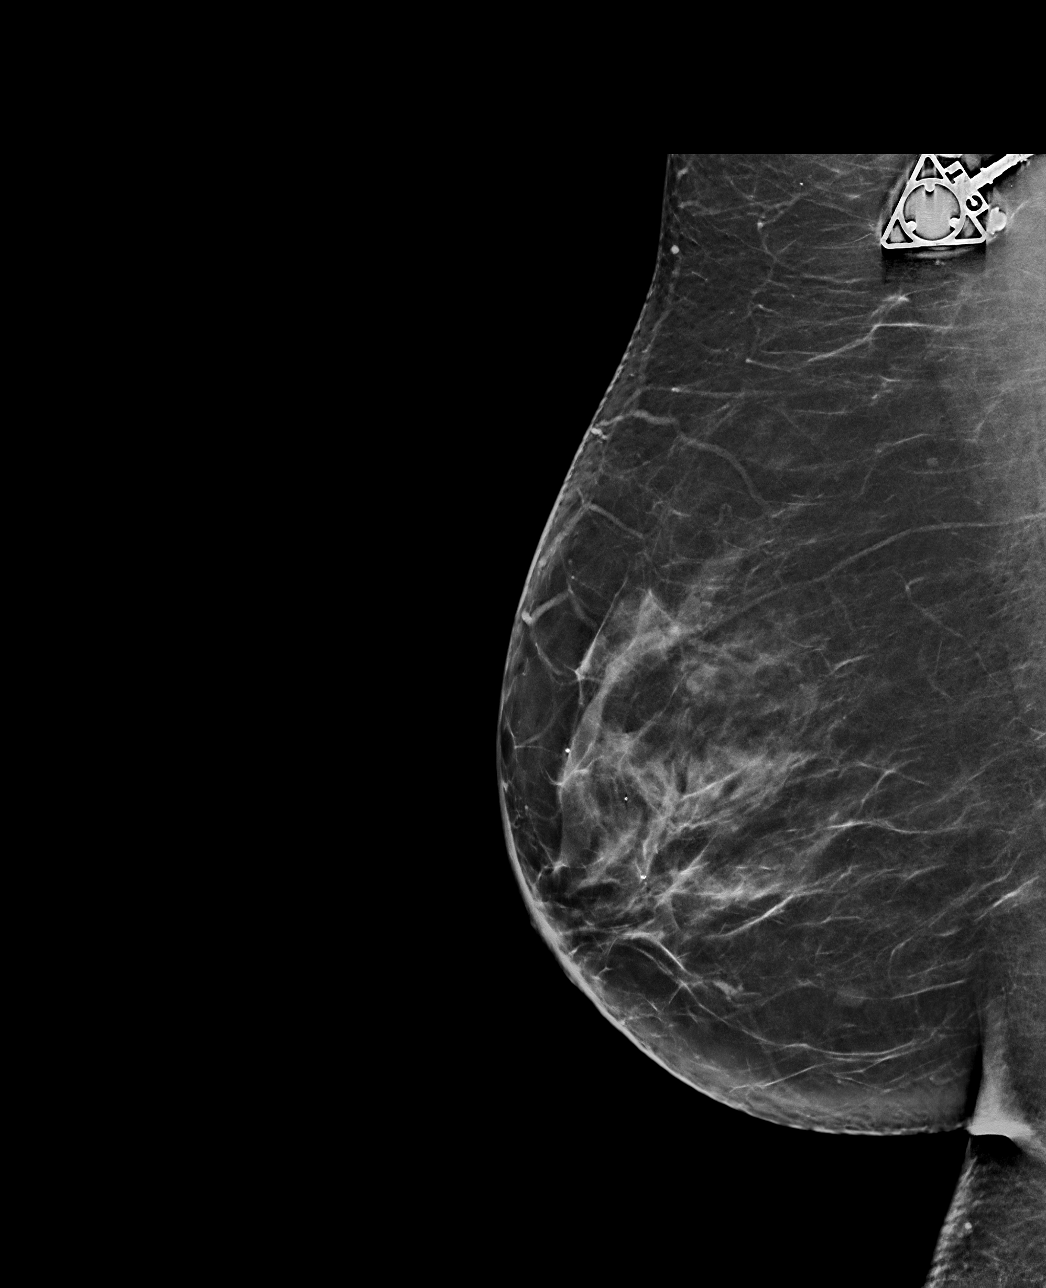

[R CC synth-2D]
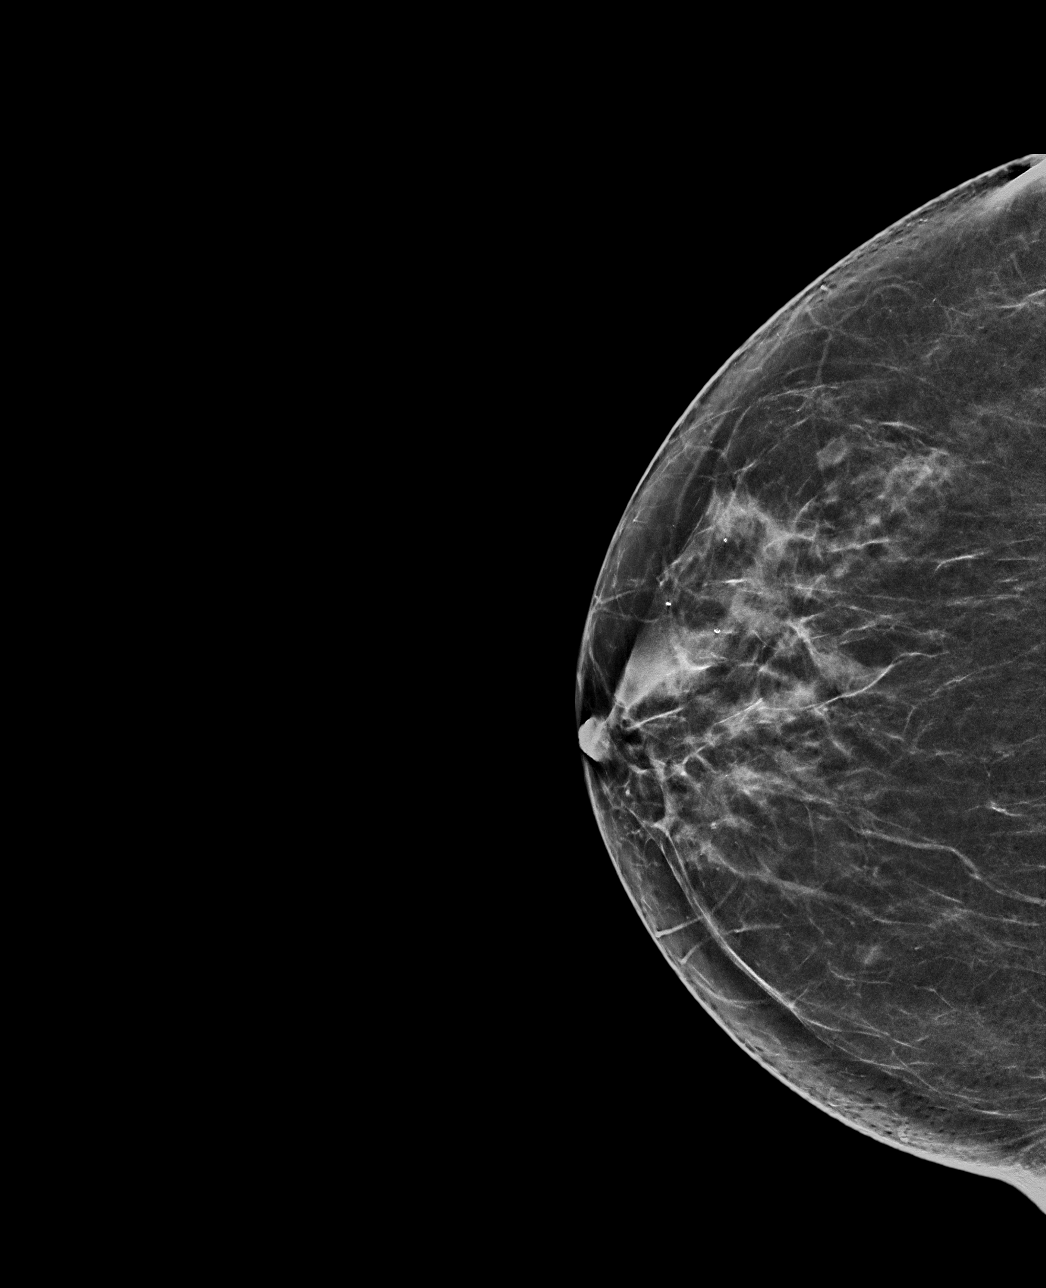

[R CC tomo · tomo slice 33/65.0]
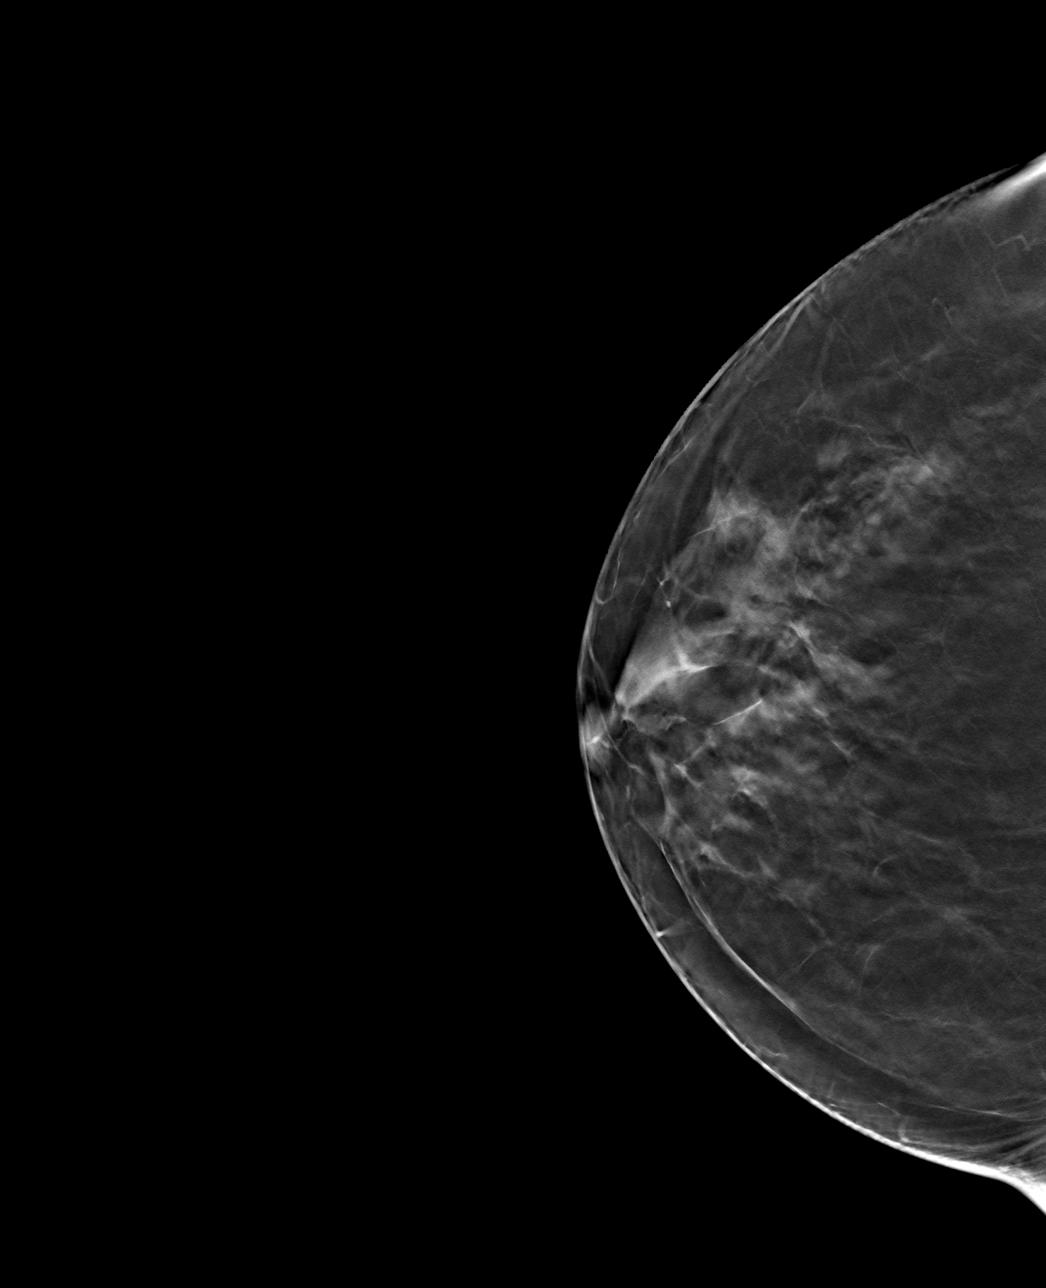

[R MLO tomo · tomo slice 38/75.0]
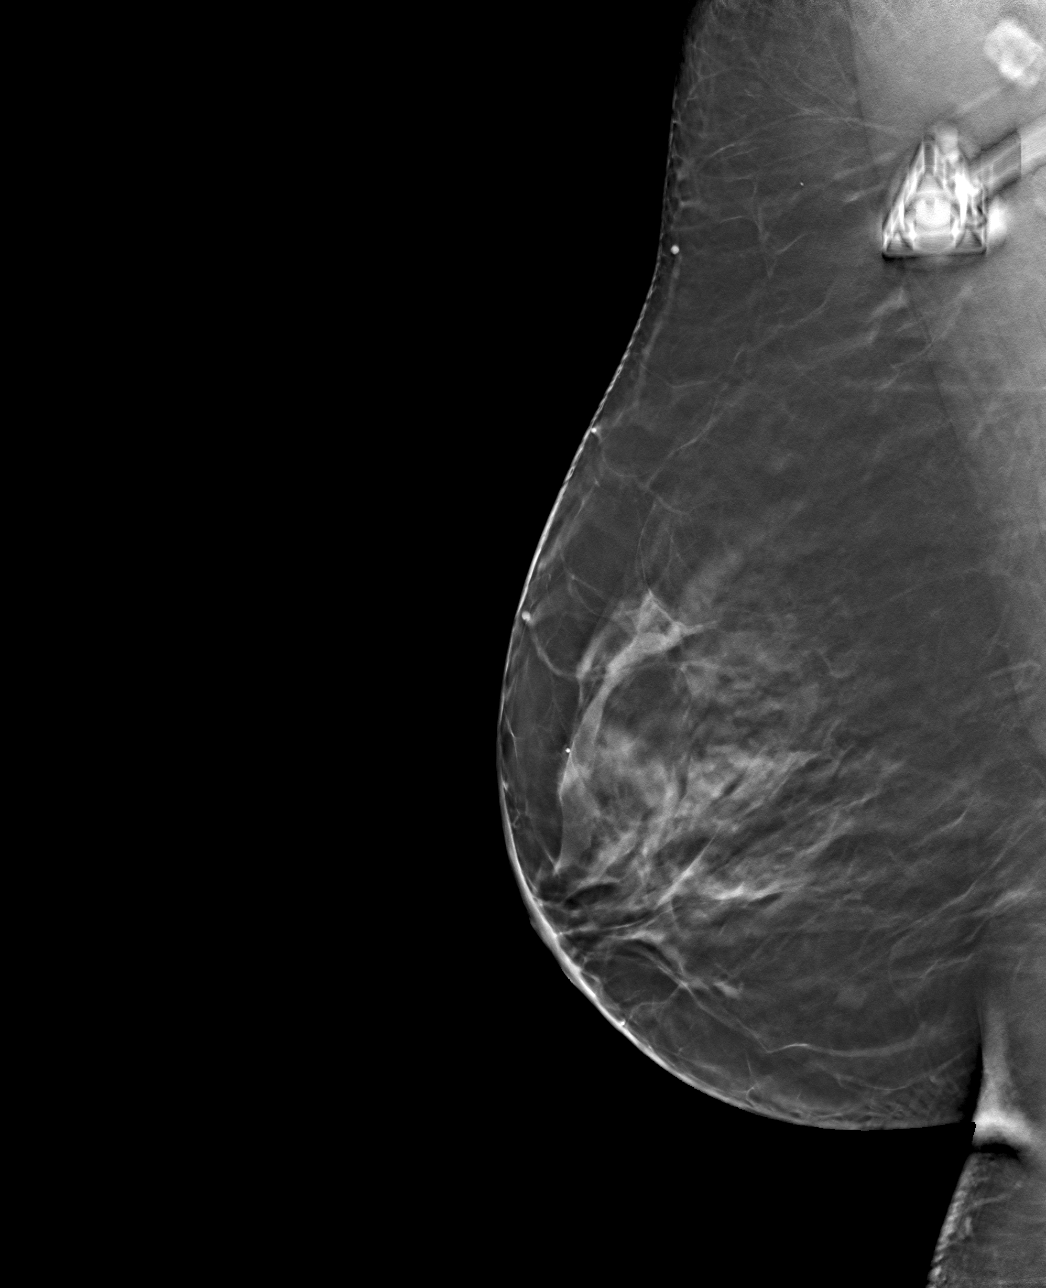

[4 of 12 positions shown; findings below may reference images not displayed]

ACR Breast Density Category b: There are scattered areas of
fibroglandular density.
FINDINGS: The patient has had a left mastectomy. There are no findings
suspicious for malignancy.
IMPRESSION: No mammographic evidence of malignancy. A result letter of this
screening mammogram will be mailed directly to the patient.

RECOMMENDATION:
Screening mammogram in one year.  (Code:[id number]-E-EGT)

BI-RADS CATEGORY  1: Negative.

## 2021-08-20 MED ORDER — HEPARIN SOD (PORK) LOCK FLUSH 100 UNIT/ML IV SOLN
500.0000 [IU] | Freq: Once | INTRAVENOUS | Status: AC
  Administered 2021-08-20: 500 [IU] via INTRAVENOUS

## 2021-08-20 MED ORDER — SODIUM CHLORIDE 0.9% FLUSH
10.0000 mL | INTRAVENOUS | Status: DC | PRN
Start: 2021-08-20 — End: 2021-08-20
  Administered 2021-08-20: 10 mL via INTRAVENOUS

## 2021-08-20 NOTE — Progress Notes (Signed)
                                                                                                                                                             Patient Name: Elizabeth Graves MRN: [id number] DOB: [birth year] Referring Physician: Narda Rutherford Date of Service: 08/10/2021 [facility] [place]                                                        End Of Treatment Note  Diagnoses: C50.012-Malignant neoplasm of nipple and areola, left female breast  Cancer Staging: Stage IIIB, cT2N1M0 grade 2 triple negative invasive ductal carcinoma of the left breast with great response to neoadjuvant chemotherapy but residual disease at the time of surgery.  Intent: Curative  Radiation Treatment Dates: 06/25/2021 through 08/10/2021 Site Technique Total Dose (Gy) Dose per Fx (Gy) Completed Fx Beam Energies  Chest Wall, Left: CW_Lt 3D 50.4/50.4 1.8 28/28 6X, 10X  Chest Wall, Left: CW_Lt_SCLV 3D 50.4/50.4 1.8 28/28 6X, 10X  Chest Wall, Left: CW_Lt_Bst Electron 10/10 2 5/5 6E   Narrative: The patient tolerated radiation therapy relatively well. She had dry desquamation at the conclusion of treatment.  Plan: The patient will receive a call in about one month from the radiation oncology department. She will continue follow up with Dr. Delton Coombes as well.    ________________________________________________    Carola Rhine, [facility]

## 2021-08-20 NOTE — Patient Instructions (Signed)
[facility]  Discharge Instructions: ?Thank you for choosing [facility] to provide your oncology and hematology care.  ?If you have a lab appointment with the Cancer Center, please come in thru the Main Entrance and check in at the main information desk. ? ?Wear comfortable clothing and clothing appropriate for easy access to any Portacath or PICC line.  ? ?We strive to give you quality time with your provider. You may need to reschedule your appointment if you arrive late (15 or more minutes).  Arriving late affects you and other patients whose appointments are after yours.  Also, if you miss three or more appointments without notifying the office, you may be dismissed from the clinic at the provider?s discretion.    ?  ?For prescription refill requests, have your pharmacy contact our office and allow 72 hours for refills to be completed.   ? ?Today you received port flush ?  ? ?BELOW ARE SYMPTOMS THAT SHOULD BE REPORTED IMMEDIATELY: ?*FEVER GREATER THAN 100.4 F (38 ?C) OR HIGHER ?*CHILLS OR SWEATING ?*NAUSEA AND VOMITING THAT IS NOT CONTROLLED WITH YOUR NAUSEA MEDICATION ?*UNUSUAL SHORTNESS OF BREATH ?*UNUSUAL BRUISING OR BLEEDING ?*URINARY PROBLEMS (pain or burning when urinating, or frequent urination) ?*BOWEL PROBLEMS (unusual diarrhea, constipation, pain near the anus) ?TENDERNESS IN MOUTH AND THROAT WITH OR WITHOUT PRESENCE OF ULCERS (sore throat, sores in mouth, or a toothache) ?UNUSUAL RASH, SWELLING OR PAIN  ?UNUSUAL VAGINAL DISCHARGE OR ITCHING  ? ?Items with * indicate a potential emergency and should be followed up as soon as possible or go to the Emergency Department if any problems should occur. ? ?Please show the CHEMOTHERAPY ALERT CARD or IMMUNOTHERAPY ALERT CARD at check-in to the Emergency Department and triage nurse. ? ?Should you have questions after your visit or need to cancel or reschedule your appointment, please contact [facility] [phone]  and  follow the prompts.  Office hours are 8:00 a.m. to 4:30 p.m. Monday - Friday. Please note that voicemails left after 4:00 p.m. may not be returned until the following business day.  We are closed weekends and major holidays. You have access to a nurse at all times for urgent questions. Please call the main number to the clinic [phone] and follow the prompts. ? ?For any non-urgent questions, you may also contact your provider using MyChart. We now offer e-Visits for anyone 18 and older to request care online for non-urgent symptoms. For details visit [email or web address]. ?  ?Also download the MyChart app! Go to the app store, search "MyChart", open the app, select [facility], and log in with your MyChart username and password. ? ?Due to Covid, a mask is required upon entering the hospital/clinic. If you do not have a mask, one will be given to you upon arrival. For doctor visits, patients may have 1 support person aged 18 or older with them. For treatment visits, patients cannot have anyone with them due to current Covid guidelines and our immunocompromised population.  ?

## 2021-08-20 NOTE — Progress Notes (Signed)
Elizabeth Graves presented for Portacath access and flush.  Portacath located left chest wall accessed with  H 20 needle. No blood return present. Portacath flushed easily with 82m NS and 500U/563mHeparin and needle removed intact.  Procedure tolerated well and without incident.

## 2021-08-25 ENCOUNTER — Telehealth: Payer: Self-pay | Admitting: Family Medicine

## 2021-08-27 NOTE — Telephone Encounter (Signed)
Sent my chart message on 8/29

## 2021-08-27 NOTE — Telephone Encounter (Signed)
Please contact patient to have her schedule appt. Thank you! °

## 2021-09-06 NOTE — Telephone Encounter (Signed)
Scheduled appointment 9/20

## 2021-09-17 ENCOUNTER — Ambulatory Visit
Admission: RE | Admit: 2021-09-17 | Discharge: 2021-09-17 | Disposition: A | Discharge status: Home / Self Care | Payer: 59 | Source: Ambulatory Visit | Attending: Radiation Oncology | Admitting: Radiation Oncology

## 2021-09-17 DIAGNOSIS — C50012 Malignant neoplasm of nipple and areola, left female breast: Secondary | ICD-10-CM

## 2021-09-17 NOTE — Progress Notes (Signed)
  Radiation Oncology         [phone] ________________________________  Name: CASSEDY GUBA MRN: [id number]  Date of Service: 09/17/2021  DOB: [birth year]  Post Treatment Telephone Note  Diagnosis:   Stage IIIB, cT2N1M0 grade 2 triple negative invasive ductal carcinoma of the left breast with great response to neoadjuvant chemotherapy but residual disease at the time of surgery.  Interval Since Last Radiation:  6 weeks   06/25/2021 through 08/10/2021 Site Technique Total Dose (Gy) Dose per Fx (Gy) Completed Fx Beam Energies  Chest Wall, Left: CW_Lt 3D 50.4/50.4 1.8 28/28 6X, 10X  Chest Wall, Left: CW_Lt_SCLV 3D 50.4/50.4 1.8 28/28 6X, 10X  Chest Wall, Left: CW_Lt_Bst Electron 10/10 2 5/5 6E    Narrative:  The patient was contacted today for routine follow-up. During treatment she did very well with radiotherapy and developed dry desquamation at the conclusion of treatment.  Impression/Plan: 1. Stage IIIB, cT2N1M0 grade 2 triple negative invasive ductal carcinoma of the left breast with great response to neoadjuvant chemotherapy but residual disease at the time of surgery. I was unable to reach the patient but left a voicemail and on the message, I  discussed that we would be happy to continue to follow her as needed, but she will also continue to follow up with Dr. Delton Coombes in medical oncology. She was counseled on skin care as well as measures to avoid sun exposure to this area.       Carola Rhine, PAC

## 2021-09-18 ENCOUNTER — Ambulatory Visit (INDEPENDENT_AMBULATORY_CARE_PROVIDER_SITE_OTHER): Payer: 59 | Admitting: Family Medicine

## 2021-09-18 ENCOUNTER — Other Ambulatory Visit: Payer: Self-pay

## 2021-09-18 ENCOUNTER — Encounter: Payer: Self-pay | Admitting: Family Medicine

## 2021-09-18 VITALS — BP 114/72 | HR 88 | Temp 97.8°F | Wt 163.8 lb

## 2021-09-18 DIAGNOSIS — I10 Essential (primary) hypertension: Secondary | ICD-10-CM

## 2021-09-18 DIAGNOSIS — E785 Hyperlipidemia, unspecified: Secondary | ICD-10-CM

## 2021-09-18 DIAGNOSIS — R7303 Prediabetes: Secondary | ICD-10-CM

## 2021-09-18 DIAGNOSIS — Z79899 Other long term (current) drug therapy: Secondary | ICD-10-CM | POA: Diagnosis not present

## 2021-09-18 MED ORDER — LISINOPRIL 5 MG PO TABS: 5.0000 mg | ORAL_TABLET | Freq: Every day | ORAL | 1 refills | Status: DC

## 2021-09-18 MED ORDER — ATORVASTATIN CALCIUM 20 MG PO TABS: 20.0000 mg | ORAL_TABLET | Freq: Every day | ORAL | 1 refills | Status: DC

## 2021-09-18 NOTE — Progress Notes (Signed)
   Subjective:    Patient ID: Elizabeth Graves, female    DOB: [birth year], 55 y.o.   MRN: [id number]  HPI Pt here for follow up. Pt states things are going better. No issues with blood pressure. Taking meds as prescribed. Pt still has port on right side of chest.  High risk medication use - Plan: Lipid Profile, Basic Metabolic Panel (BMET), Hemoglobin A1c  Prediabetes - Plan: Lipid Profile, Basic Metabolic Panel (BMET), Hemoglobin A1c  HTN (hypertension), benign - Plan: Lipid Profile, Basic Metabolic Panel (BMET), Hemoglobin A1c  Hyperlipidemia, unspecified hyperlipidemia type - Plan: Lipid Profile, Basic Metabolic Panel (BMET), Hemoglobin A1c Main concern with patient and his blood pressure hyperlipidemia and prediabetes we did discuss the importance of checking some lab work.  Also watching diet continuing medications Patient sees cancer doctor on every several months basis recently went through treatment for breast cancer including mastectomy  Review of Systems     Objective:   Physical Exam  General-in no acute distress Eyes-no discharge Lungs-respiratory rate normal, CTA CV-no murmurs,RRR Extremities skin warm dry no edema Neuro grossly normal Behavior normal, alert       Assessment & Plan:  1. High risk medication use Labs ordered await results - Lipid Profile - Basic Metabolic Panel (BMET) - Hemoglobin A1c  2. Prediabetes Minimize starches in diet await result of A1c may have to consider medication for this - Lipid Profile - Basic Metabolic Panel (BMET) - Hemoglobin A1c  3. HTN (hypertension), benign Blood pressure fair control continue current measures. - Lipid Profile - Basic Metabolic Panel (BMET) - Hemoglobin A1c  4. Hyperlipidemia, unspecified hyperlipidemia type Continue statin watch diet check labs - Lipid Profile - Basic Metabolic Panel (BMET) - Hemoglobin A1c  Follow-up in 6 months

## 2021-09-19 ENCOUNTER — Other Ambulatory Visit: Payer: Self-pay

## 2021-09-19 DIAGNOSIS — Z1211 Encounter for screening for malignant neoplasm of colon: Secondary | ICD-10-CM

## 2021-09-19 NOTE — Progress Notes (Signed)
Left message to inform patient of colonoscopy referral orders per drs notes

## 2021-09-20 ENCOUNTER — Encounter: Payer: Self-pay | Admitting: Internal Medicine

## 2021-10-23 ENCOUNTER — Inpatient Hospital Stay (HOSPITAL_COMMUNITY): Payer: 59 | Attending: Hematology

## 2021-10-23 ENCOUNTER — Other Ambulatory Visit: Payer: Self-pay

## 2021-10-23 VITALS — BP 102/69 | HR 88 | Temp 98.3°F | Resp 16

## 2021-10-23 DIAGNOSIS — Z95828 Presence of other vascular implants and grafts: Secondary | ICD-10-CM

## 2021-10-23 DIAGNOSIS — Z452 Encounter for adjustment and management of vascular access device: Secondary | ICD-10-CM | POA: Diagnosis not present

## 2021-10-23 DIAGNOSIS — C50012 Malignant neoplasm of nipple and areola, left female breast: Secondary | ICD-10-CM | POA: Insufficient documentation

## 2021-10-23 DIAGNOSIS — Z171 Estrogen receptor negative status [ER-]: Secondary | ICD-10-CM | POA: Diagnosis not present

## 2021-10-23 MED ORDER — HEPARIN SOD (PORK) LOCK FLUSH 100 UNIT/ML IV SOLN
500.0000 [IU] | Freq: Once | INTRAVENOUS | Status: AC
  Administered 2021-10-23: 500 [IU] via INTRAVENOUS

## 2021-10-23 MED ORDER — SODIUM CHLORIDE 0.9% FLUSH
10.0000 mL | Freq: Once | INTRAVENOUS | Status: AC
  Administered 2021-10-23: 10 mL via INTRAVENOUS

## 2021-10-23 NOTE — Progress Notes (Signed)
Port flushed with good blood return noted. No bruising or swelling at site. Bandaid applied and patient discharged in satisfactory condition. VVS stable with no signs or symptoms of distressed noted. 

## 2021-10-23 NOTE — Patient Instructions (Signed)
[facility]  Discharge Instructions: Thank you for choosing [facility] to provide your oncology and hematology care.  If you have a lab appointment with the Cancer Center, please come in thru the Main Entrance and check in at the main information desk.  Wear comfortable clothing and clothing appropriate for easy access to any Portacath or PICC line.   We strive to give you quality time with your provider. You may need to reschedule your appointment if you arrive late (15 or more minutes).  Arriving late affects you and other patients whose appointments are after yours.  Also, if you miss three or more appointments without notifying the office, you may be dismissed from the clinic at the provider's discretion.      For prescription refill requests, have your pharmacy contact our office and allow 72 hours for refills to be completed.    Today your port was flushed. Return as scheduled.     To help prevent nausea and vomiting after your treatment, we encourage you to take your nausea medication as directed.  BELOW ARE SYMPTOMS THAT SHOULD BE REPORTED IMMEDIATELY: *FEVER GREATER THAN 100.4 F (38 C) OR HIGHER *CHILLS OR SWEATING *NAUSEA AND VOMITING THAT IS NOT CONTROLLED WITH YOUR NAUSEA MEDICATION *UNUSUAL SHORTNESS OF BREATH *UNUSUAL BRUISING OR BLEEDING *URINARY PROBLEMS (pain or burning when urinating, or frequent urination) *BOWEL PROBLEMS (unusual diarrhea, constipation, pain near the anus) TENDERNESS IN MOUTH AND THROAT WITH OR WITHOUT PRESENCE OF ULCERS (sore throat, sores in mouth, or a toothache) UNUSUAL RASH, SWELLING OR PAIN  UNUSUAL VAGINAL DISCHARGE OR ITCHING   Items with * indicate a potential emergency and should be followed up as soon as possible or go to the Emergency Department if any problems should occur.  Please show the CHEMOTHERAPY ALERT CARD or IMMUNOTHERAPY ALERT CARD at check-in to the Emergency Department and triage nurse.  Should  you have questions after your visit or need to cancel or reschedule your appointment, please contact [facility] [phone]  and follow the prompts.  Office hours are 8:00 a.m. to 4:30 p.m. Monday - Friday. Please note that voicemails left after 4:00 p.m. may not be returned until the following business day.  We are closed weekends and major holidays. You have access to a nurse at all times for urgent questions. Please call the main number to the clinic [phone] and follow the prompts.  For any non-urgent questions, you may also contact your provider using MyChart. We now offer e-Visits for anyone 18 and older to request care online for non-urgent symptoms. For details visit [email or web address].   Also download the MyChart app! Go to the app store, search "MyChart", open the app, select [facility], and log in with your MyChart username and password.  Due to Covid, a mask is required upon entering the hospital/clinic. If you do not have a mask, one will be given to you upon arrival. For doctor visits, patients may have 1 support person aged 18 or older with them. For treatment visits, patients cannot have anyone with them due to current Covid guidelines and our immunocompromised population.  

## 2021-10-24 ENCOUNTER — Encounter: Payer: Self-pay | Admitting: *Deleted

## 2021-10-24 ENCOUNTER — Ambulatory Visit (INDEPENDENT_AMBULATORY_CARE_PROVIDER_SITE_OTHER): Payer: 59 | Admitting: *Deleted

## 2021-10-24 VITALS — Ht <= 58 in | Wt 161.0 lb

## 2021-10-24 DIAGNOSIS — Z1211 Encounter for screening for malignant neoplasm of colon: Secondary | ICD-10-CM

## 2021-10-24 MED ORDER — PEG 3350-KCL-NA BICARB-NACL 420 G PO SOLR: 4000.0000 mL | Freq: Once | ORAL | 0 refills | Status: AC

## 2021-10-24 NOTE — Progress Notes (Signed)
Gastroenterology Pre-Procedure Review  Request Date: 10/24/2021 Requesting Physician: Dr. Wolfgang Phoenix, no previous TCS  PATIENT REVIEW QUESTIONS: The patient responded to the following health history questions as indicated:    1. Diabetes Melitis: no 2. Joint replacements in the past 12 months: no 3. Major health problems in the past 3 months: no 4. Has an artificial valve or MVP: no 5. Has a defibrillator: no 6. Has been advised in past to take antibiotics in advance of a procedure like teeth cleaning: no 7. Family history of colon cancer: no  8. Alcohol Use: no 9. Illicit drug Use: no 10. History of sleep apnea: no  11. History of coronary artery or other vascular stents placed within the last 12 months: no 12. History of any prior anesthesia complications: no 13. Body mass index is 36.1 kg/m.    MEDICATIONS & ALLERGIES:    Patient reports the following regarding taking any blood thinners:   Plavix? no Aspirin? no Coumadin? no Brilinta? no Xarelto? no Eliquis? no Pradaxa? no Savaysa? no Effient? no  Patient confirms/reports the following medications:  Current Outpatient Medications  Medication Sig Dispense Refill   albuterol (VENTOLIN HFA) 108 (90 Base) MCG/ACT inhaler TAKE 2 PUFFS BY MOUTH EVERY 6 HOURS AS NEEDED FOR WHEEZE OR SHORTNESS OF BREATH (Patient taking differently: Inhale 2 puffs into the lungs as needed.) 8.5 Inhaler 0   atorvastatin (LIPITOR) 20 MG tablet Take 1 tablet (20 mg total) by mouth daily. 90 tablet 1   diphenhydrAMINE (BENADRYL) 25 mg capsule Take 25 mg by mouth as needed for allergies. Allergies.     esomeprazole (NEXIUM) 20 MG capsule Take 20 mg by mouth daily before breakfast.     ibuprofen (ADVIL,MOTRIN) 200 MG tablet Take 400-600 mg by mouth as needed for moderate pain (pain.).     lisinopril (ZESTRIL) 5 MG tablet Take 1 tablet (5 mg total) by mouth daily. 90 tablet 1   loratadine (CLARITIN) 10 MG tablet Take 10 mg by mouth daily.     magnesium  oxide (MAG-OX) 400 (240 Mg) MG tablet TAKE 1 TABLET BY MOUTH TWICE A DAY 180 tablet 1   naproxen sodium (ALEVE) 220 MG tablet Take 440 mg by mouth as needed (pain.).     potassium chloride (KLOR-CON) 10 MEQ tablet TAKE 1 TABLET BY MOUTH EVERY DAY (Patient taking differently: Take 10 mEq by mouth daily.) 90 tablet 6   No current facility-administered medications for this visit.    Patient confirms/reports the following allergies:  No Known Allergies  No orders of the defined types were placed in this encounter.   AUTHORIZATION INFORMATION Primary Insurance: [employer],  ID #: [id number],  Group #: [id number] Pre-Cert / Auth required: No, not required  SCHEDULE INFORMATION: Procedure has been scheduled as follows:  Date: 11/16/2021, Time: 1:30  Location: [facility] with Dr. Abbey Chatters  This Gastroenterology Pre-Precedure Review Form is being routed to the following provider(s): Roseanne Kaufman, NP

## 2021-10-26 NOTE — Progress Notes (Signed)
Appropriate. ASA 2.  

## 2021-11-16 ENCOUNTER — Ambulatory Visit (HOSPITAL_COMMUNITY): Payer: 59 | Admitting: Anesthesiology

## 2021-11-16 ENCOUNTER — Encounter (HOSPITAL_COMMUNITY)
Admission: RE | Disposition: A | Discharge status: Home / Self Care | Payer: Self-pay | Source: Home / Self Care | Attending: Internal Medicine

## 2021-11-16 ENCOUNTER — Other Ambulatory Visit: Payer: Self-pay

## 2021-11-16 ENCOUNTER — Ambulatory Visit (HOSPITAL_COMMUNITY)
Admission: RE | Admit: 2021-11-16 | Discharge: 2021-11-16 | Disposition: A | Discharge status: Home / Self Care | Payer: 59 | Attending: Internal Medicine | Admitting: Internal Medicine

## 2021-11-16 ENCOUNTER — Encounter (HOSPITAL_COMMUNITY): Payer: Self-pay

## 2021-11-16 DIAGNOSIS — F419 Anxiety disorder, unspecified: Secondary | ICD-10-CM | POA: Diagnosis not present

## 2021-11-16 DIAGNOSIS — D124 Benign neoplasm of descending colon: Secondary | ICD-10-CM | POA: Diagnosis not present

## 2021-11-16 DIAGNOSIS — Z1211 Encounter for screening for malignant neoplasm of colon: Secondary | ICD-10-CM

## 2021-11-16 DIAGNOSIS — Z87891 Personal history of nicotine dependence: Secondary | ICD-10-CM | POA: Insufficient documentation

## 2021-11-16 DIAGNOSIS — K635 Polyp of colon: Secondary | ICD-10-CM | POA: Insufficient documentation

## 2021-11-16 DIAGNOSIS — K648 Other hemorrhoids: Secondary | ICD-10-CM | POA: Diagnosis not present

## 2021-11-16 DIAGNOSIS — F32A Depression, unspecified: Secondary | ICD-10-CM | POA: Diagnosis not present

## 2021-11-16 DIAGNOSIS — J45909 Unspecified asthma, uncomplicated: Secondary | ICD-10-CM | POA: Diagnosis not present

## 2021-11-16 DIAGNOSIS — I10 Essential (primary) hypertension: Secondary | ICD-10-CM | POA: Diagnosis not present

## 2021-11-16 DIAGNOSIS — G709 Myoneural disorder, unspecified: Secondary | ICD-10-CM | POA: Diagnosis not present

## 2021-11-16 HISTORY — PX: COLONOSCOPY WITH PROPOFOL: SHX5780

## 2021-11-16 HISTORY — PX: POLYPECTOMY: SHX5525

## 2021-11-16 SURGERY — COLONOSCOPY WITH PROPOFOL
Anesthesia: General

## 2021-11-16 MED ORDER — PROPOFOL 500 MG/50ML IV EMUL
INTRAVENOUS | Status: DC | PRN
  Administered 2021-11-16: 150 ug/kg/min via INTRAVENOUS

## 2021-11-16 MED ORDER — LACTATED RINGERS IV SOLN: INTRAVENOUS | Status: DC | PRN

## 2021-11-16 MED ORDER — LIDOCAINE HCL 1 % IJ SOLN
INTRAMUSCULAR | Status: DC | PRN
  Administered 2021-11-16: 50 mg via INTRADERMAL

## 2021-11-16 MED ORDER — PROPOFOL 10 MG/ML IV BOLUS
INTRAVENOUS | Status: DC | PRN
  Administered 2021-11-16: 80 mg via INTRAVENOUS

## 2021-11-16 MED ORDER — LACTATED RINGERS IV SOLN: INTRAVENOUS | Status: DC

## 2021-11-16 NOTE — H&P (Signed)
Primary Care Physician:  Kathyrn Drown, MD Primary Gastroenterologist:  Dr. Abbey Chatters  Pre-Procedure History & Physical: HPI:  Elizabeth Graves is a 55 y.o. female is here for a colonoscopy for colon cancer screening purposes.  Patient denies any family history of colorectal cancer.  No melena or hematochezia.  No abdominal pain or unintentional weight loss.  No change in bowel habits.  Overall feels well from a GI standpoint.  Past Medical History:  Diagnosis Date   Asthma    Breast cancer ([place])    Cancer ([place])    cervical - 20 yrs ago   Carpal tunnel syndrome    Depression    Family history of breast cancer    Family history of lung cancer    Hypertension    Hypokalemia    tx w/ k-Dur   Port-A-Cath in place 07/24/2020   Seasonal allergies    tx with benadryl prn   SVD (spontaneous vaginal delivery)    x 2    Past Surgical History:  Procedure Laterality Date   ABDOMINAL HYSTERECTOMY     CO2 Laser of Cervix     for cervical cancer 20 yrs ago   DILATION AND CURETTAGE OF UTERUS  12/2010   polyp removed   DILATION AND CURETTAGE OF UTERUS     x 3 for MAB   LAPAROSCOPIC ASSISTED VAGINAL HYSTERECTOMY  06/09/2012   Procedure: LAPAROSCOPIC ASSISTED VAGINAL HYSTERECTOMY;  Surgeon: Gus Height, MD;  Location: [facility];  Service: Gynecology;  Laterality: N/A;   MASTECTOMY MODIFIED RADICAL Left 02/09/2021   Procedure: MASTECTOMY MODIFIED RADICAL;  Surgeon: Virl Cagey, MD;  Location: [facility];  Service: General;  Laterality: Left;   PORTACATH PLACEMENT Right 07/17/2020   Procedure: INSERTION PORT-A-CATH;  Surgeon: Virl Cagey, MD;  Location: [facility];  Service: General;  Laterality: Right;   SALPINGOOPHORECTOMY  06/09/2012   Procedure: SALPINGO OOPHERECTOMY;  Surgeon: Gus Height, MD;  Location: [facility];  Service: Gynecology;  Laterality: Bilateral;   TUBAL LIGATION      Prior to Admission medications   Medication Sig Start Date End Date Taking? Authorizing Provider  atorvastatin  (LIPITOR) 20 MG tablet Take 1 tablet (20 mg total) by mouth daily. 09/18/21  Yes Kathyrn Drown, MD  calcium carbonate (OSCAL) 1500 (600 Ca) MG TABS tablet Take 600 mg of elemental calcium by mouth in the morning.   Yes [provider]  diphenhydrAMINE (BENADRYL) 25 mg capsule Take 25 mg by mouth as needed for allergies. Allergies.   Yes [provider]  esomeprazole (NEXIUM) 20 MG capsule Take 20 mg by mouth daily before breakfast.   Yes [provider]  ibuprofen (ADVIL,MOTRIN) 200 MG tablet Take 400-600 mg by mouth as needed for moderate pain (pain.).   Yes [provider]  lisinopril (ZESTRIL) 5 MG tablet Take 1 tablet (5 mg total) by mouth daily. 09/18/21  Yes Kathyrn Drown, MD  loratadine (CLARITIN) 10 MG tablet Take 10 mg by mouth in the morning.   Yes [provider]  naproxen sodium (ALEVE) 220 MG tablet Take 440 mg by mouth as needed (pain.).   Yes [provider]  albuterol (VENTOLIN HFA) 108 (90 Base) MCG/ACT inhaler TAKE 2 PUFFS BY MOUTH EVERY 6 HOURS AS NEEDED FOR WHEEZE OR SHORTNESS OF BREATH Patient not taking: No sig reported 04/28/19   Kathyrn Drown, MD  magnesium oxide (MAG-OX) 400 (240 Mg) MG tablet TAKE 1 TABLET BY MOUTH TWICE A DAY Patient not  taking: Reported on 11/12/2021 07/25/21   Derek Jack, MD  polyethylene glycol-electrolytes (NULYTELY) 420 g solution Take 4,000 mLs by mouth once. 10/24/21   [provider]  potassium chloride (KLOR-CON) 10 MEQ tablet TAKE 1 TABLET BY MOUTH EVERY DAY Patient not taking: Reported on 11/12/2021 01/16/21   Derek Jack, MD    Allergies as of 10/26/2021   (No Known Allergies)    Family History  Problem Relation Age of Onset   Heart disease Mother    Lung cancer Mother 56       smoker   Heart disease Father    Esophageal cancer Father        GE junction, dx. in his early 16s   Diverticulitis Sister    Diabetes Sister    Diabetes Maternal Grandmother     Heart disease Maternal Grandmother    Throat cancer Maternal Grandmother        dx. late 58s   Healthy Sister    Healthy Daughter    Healthy Son    Breast cancer Paternal Aunt        limited info    Social History   Socioeconomic History   Marital status: Married    Spouse name: Not on file   Number of children: 2   Years of education: Not on file   Highest education level: Not on file  Occupational History   Occupation: EMPLOYED  Tobacco Use   Smoking status: Former    Packs/day: 1.00    Years: 16.00    Pack years: 16.00    Types: Cigarettes    Start date: 05/30/1994    Quit date: 01/31/2020    Years since quitting: 1.7   Smokeless tobacco: Never  Vaping Use   Vaping Use: Never used  Substance and Sexual Activity   Alcohol use: No   Drug use: No   Sexual activity: Yes    Birth control/protection: Surgical  Other Topics Concern   Not on file  Social History Narrative   Not on file   Social Determinants of Health   Financial Resource Strain: Not on file  Food Insecurity: Not on file  Transportation Needs: Not on file  Physical Activity: Not on file  Stress: Not on file  Social Connections: Not on file  Intimate Partner Violence: Not At Risk   Fear of Current or Ex-Partner: No   Emotionally Abused: No   Physically Abused: No   Sexually Abused: No    Review of Systems: See HPI, otherwise negative ROS  Physical Exam: Vital signs in last 24 hours: Temp:  [98.7 F (37.1 C)] 98.7 F (37.1 C) (11/18 1222) Resp:  [26] 26 (11/18 1222) BP: (120)/(86) 120/86 (11/18 1222) SpO2:  [97 %] 97 % (11/18 1222) Weight:  [73 kg] 73 kg (11/18 1222)   General:   Alert,  Well-developed, well-nourished, pleasant and cooperative in NAD Head:  Normocephalic and atraumatic. Eyes:  Sclera clear, no icterus.   Conjunctiva pink. Ears:  Normal auditory acuity. Nose:  No deformity, discharge,  or lesions. Mouth:  No deformity or lesions, dentition normal. Neck:  Supple; no  masses or thyromegaly. Lungs:  Clear throughout to auscultation.   No wheezes, crackles, or rhonchi. No acute distress. Heart:  Regular rate and rhythm; no murmurs, clicks, rubs,  or gallops. Abdomen:  Soft, nontender and nondistended. No masses, hepatosplenomegaly or hernias noted. Normal bowel sounds, without guarding, and without rebound.   Msk:  Symmetrical without gross deformities. Normal posture. Extremities:  Without clubbing or edema. Neurologic:  Alert and  oriented x4;  grossly normal neurologically. Skin:  Intact without significant lesions or rashes. Cervical Nodes:  No significant cervical adenopathy. Psych:  Alert and cooperative. Normal mood and affect.  Impression/Plan: Elizabeth Graves is here for a colonoscopy to be performed for colon cancer screening purposes.  The risks of the procedure including infection, bleed, or perforation as well as benefits, limitations, alternatives and imponderables have been reviewed with the patient. Questions have been answered. All parties agreeable.

## 2021-11-16 NOTE — Discharge Instructions (Addendum)
  Colonoscopy Discharge Instructions  Read the instructions outlined below and refer to this sheet in the next few weeks. These discharge instructions provide you with general information on caring for yourself after you leave the hospital. Your doctor may also give you specific instructions. While your treatment has been planned according to the most current medical practices available, unavoidable complications occasionally occur.   ACTIVITY You may resume your regular activity, but move at a slower pace for the next 24 hours.  Take frequent rest periods for the next 24 hours.  Walking will help get rid of the air and reduce the bloated feeling in your belly (abdomen).  No driving for 24 hours (because of the medicine (anesthesia) used during the test).   Do not sign any important legal documents or operate any machinery for 24 hours (because of the anesthesia used during the test).  NUTRITION Drink plenty of fluids.  You may resume your normal diet as instructed by your doctor.  Begin with a light meal and progress to your normal diet. Heavy or fried foods are harder to digest and may make you feel sick to your stomach (nauseated).  Avoid alcoholic beverages for 24 hours or as instructed.  MEDICATIONS You may resume your normal medications unless your doctor tells you otherwise.  WHAT YOU CAN EXPECT TODAY Some feelings of bloating in the abdomen.  Passage of more gas than usual.  Spotting of blood in your stool or on the toilet paper.  IF YOU HAD POLYPS REMOVED DURING THE COLONOSCOPY: No aspirin products for 7 days or as instructed.  No alcohol for 7 days or as instructed.  Eat a soft diet for the next 24 hours.  FINDING OUT THE RESULTS OF YOUR TEST Not all test results are available during your visit. If your test results are not back during the visit, make an appointment with your caregiver to find out the results. Do not assume everything is normal if you have not heard from your  caregiver or the medical facility. It is important for you to follow up on all of your test results.  SEEK IMMEDIATE MEDICAL ATTENTION IF: You have more than a spotting of blood in your stool.  Your belly is swollen (abdominal distention).  You are nauseated or vomiting.  You have a temperature over 101.  You have abdominal pain or discomfort that is severe or gets worse throughout the day.   Your colonoscopy revealed 1 polyp(s) which I removed successfully. Await pathology results, my office will contact you. I recommend repeating colonoscopy in 5-10 years for surveillance purposes depending on pathology.    I hope you have a great rest of your week!  Elon Alas. Abbey Chatters, D.O. Gastroenterology and Hepatology [facility]

## 2021-11-16 NOTE — Transfer of Care (Signed)
Immediate Anesthesia Transfer of Care Note  Patient: Elizabeth Graves  Procedure(s) Performed: COLONOSCOPY WITH PROPOFOL  Patient Location: Endoscopy Unit  Anesthesia Type:General  Level of Consciousness: awake  Airway & Oxygen Therapy: Patient Spontanous Breathing  Post-op Assessment: Report given to RN  Post vital signs: Reviewed and stable  Last Vitals:  Vitals Value Taken Time  BP    Temp    Pulse    Resp    SpO2      Last Pain:  Vitals:   11/16/21 1222  TempSrc: Oral  PainSc: 0-No pain      Patients Stated Pain Goal: 10 (16/74/25 5258)  Complications: No notable events documented.

## 2021-11-16 NOTE — Op Note (Signed)
[facility] Patient Name: Elizabeth Graves Procedure Date: 11/16/2021 12:27 PM MRN: [id number] Date of Birth: [birth year] Attending MD: Elon Alas. Abbey Chatters DO CSN: [id number] Age: 55 Admit Type: Outpatient Procedure:                Colonoscopy Indications:              Screening for colorectal malignant neoplasm Providers:                Elon Alas. Abbey Chatters, DO, Lurline Del, RN, Kristine L.                            Risa Grill, Technician Referring MD:              Medicines:                See the Anesthesia note for documentation of the                            administered medications Complications:            No immediate complications. Estimated Blood Loss:     Estimated blood loss was minimal. Procedure:                Pre-Anesthesia Assessment:                           - The anesthesia plan was to use monitored                            anesthesia care (MAC).                           After obtaining informed consent, the colonoscope                            was passed under direct vision. Throughout the                            procedure, the patient's blood pressure, pulse, and                            oxygen saturations were monitored continuously. The                            [id number]-[id number] ([id number]) was introduced through the                            anus and advanced to the the cecum, identified by                            appendiceal orifice and ileocecal valve. The                            colonoscopy was performed without difficulty. The                            patient tolerated the procedure well.  The quality                            of the bowel preparation was evaluated using the                            BBPS [facility] Bowel Preparation Scale) with scores                            of: Right Colon = 3, Transverse Colon = 3 and Left                            Colon = 3 (entire mucosa seen well with no residual                            staining,  small fragments of stool or opaque                            liquid). The total BBPS score equals 9. Scope In: 1:16:38 PM Scope Out: 1:26:29 PM Scope Withdrawal Time: 0 hours 8 minutes 2 seconds  Total Procedure Duration: 0 hours 9 minutes 51 seconds  Findings:      The perianal and digital rectal examinations were normal.      Non-bleeding internal hemorrhoids were found during endoscopy.      A 2 mm polyp was found in the descending colon. The polyp was sessile.       The polyp was removed with a cold biopsy forceps. Resection and       retrieval were complete.      The exam was otherwise without abnormality. Impression:               - Non-bleeding internal hemorrhoids.                           - One 2 mm polyp in the descending colon, removed                            with a cold biopsy forceps. Resected and retrieved.                           - The examination was otherwise normal. Moderate Sedation:      Per Anesthesia Care Recommendation:           - Patient has a contact number available for                            emergencies. The signs and symptoms of potential                            delayed complications were discussed with the                            patient. Return to normal activities tomorrow.  Written discharge instructions were provided to the                            patient.                           - Resume previous diet.                           - Continue present medications.                           - Await pathology results.                           - Repeat colonoscopy in 5-10 years for surveillance                            depending on pathology.                           - Return to GI clinic PRN. Procedure Code(s):        --- Professional ---                           [id number], Colonoscopy, flexible; with biopsy, single                            or multiple Diagnosis Code(s):        --- Professional ---                            Z12.11, Encounter for screening for malignant                            neoplasm of colon                           K63.5, Polyp of colon                           K64.8, Other hemorrhoids CPT copyright 2019 American Medical Association. All rights reserved. The codes documented in this report are preliminary and upon coder review may  be revised to meet current compliance requirements. Elon Alas. Abbey Chatters, DO [place] Abbey Chatters, DO 11/16/2021 1:28:22 PM This report has been signed electronically. Number of Addenda: 0

## 2021-11-16 NOTE — Anesthesia Postprocedure Evaluation (Signed)
Anesthesia Post Note  Patient: Elizabeth Graves  Procedure(s) Performed: COLONOSCOPY WITH PROPOFOL  Patient location during evaluation: Endoscopy Anesthesia Type: General Level of consciousness: awake and alert Pain management: pain level controlled Vital Signs Assessment: post-procedure vital signs reviewed and stable Respiratory status: spontaneous breathing Cardiovascular status: blood pressure returned to baseline and stable Postop Assessment: no apparent nausea or vomiting Anesthetic complications: no   No notable events documented.   Last Vitals:  Vitals:   11/16/21 1222 11/16/21 1332  BP: 120/86 106/61  Pulse:  84  Resp: (!) 26 (!) 21  Temp: 37.1 C 36.6 C  SpO2: 97% 99%    Last Pain:  Vitals:   11/16/21 1332  TempSrc: Oral  PainSc: 0-No pain                 Landyn Buckalew

## 2021-11-16 NOTE — Anesthesia Preprocedure Evaluation (Signed)
Anesthesia Evaluation  Patient identified by MRN, date of birth, ID band Patient awake    Reviewed: Allergy & Precautions, H&P , NPO status , Patient's Chart, lab work & pertinent test results, reviewed documented beta blocker date and time   Airway Mallampati: II  TM Distance: >3 FB Neck ROM: full    Dental no notable dental hx.    Pulmonary asthma , former smoker,    Pulmonary exam normal breath sounds clear to auscultation       Cardiovascular Exercise Tolerance: Good hypertension, negative cardio ROS   Rhythm:regular Rate:Normal     Neuro/Psych PSYCHIATRIC DISORDERS Anxiety Depression  Neuromuscular disease    GI/Hepatic negative GI ROS, Neg liver ROS,   Endo/Other  negative endocrine ROS  Renal/GU negative Renal ROS  negative genitourinary   Musculoskeletal   Abdominal   Peds  Hematology negative hematology ROS (+)   Anesthesia Other Findings   Reproductive/Obstetrics negative OB ROS                             Anesthesia Physical Anesthesia Plan  ASA: 2  Anesthesia Plan: General   Post-op Pain Management:    Induction:   PONV Risk Score and Plan: Propofol infusion  Airway Management Planned:   Additional Equipment:   Intra-op Plan:   Post-operative Plan:   Informed Consent: I have reviewed the patients History and Physical, chart, labs and discussed the procedure including the risks, benefits and alternatives for the proposed anesthesia with the patient or authorized representative who has indicated his/her understanding and acceptance.     Dental Advisory Given  Plan Discussed with: CRNA  Anesthesia Plan Comments:         Anesthesia Quick Evaluation

## 2021-11-20 ENCOUNTER — Encounter (HOSPITAL_COMMUNITY): Payer: Self-pay | Admitting: Internal Medicine

## 2021-11-20 LAB — SURGICAL PATHOLOGY

## 2021-11-29 ENCOUNTER — Other Ambulatory Visit (HOSPITAL_COMMUNITY): Payer: 59

## 2021-12-03 ENCOUNTER — Inpatient Hospital Stay (HOSPITAL_COMMUNITY): Payer: 59 | Attending: Hematology

## 2021-12-03 ENCOUNTER — Other Ambulatory Visit: Payer: Self-pay

## 2021-12-03 DIAGNOSIS — Z9221 Personal history of antineoplastic chemotherapy: Secondary | ICD-10-CM | POA: Diagnosis not present

## 2021-12-03 DIAGNOSIS — Z9012 Acquired absence of left breast and nipple: Secondary | ICD-10-CM | POA: Insufficient documentation

## 2021-12-03 DIAGNOSIS — C50012 Malignant neoplasm of nipple and areola, left female breast: Secondary | ICD-10-CM | POA: Insufficient documentation

## 2021-12-03 DIAGNOSIS — Z923 Personal history of irradiation: Secondary | ICD-10-CM | POA: Insufficient documentation

## 2021-12-03 DIAGNOSIS — Z800 Family history of malignant neoplasm of digestive organs: Secondary | ICD-10-CM | POA: Insufficient documentation

## 2021-12-03 DIAGNOSIS — Z803 Family history of malignant neoplasm of breast: Secondary | ICD-10-CM | POA: Insufficient documentation

## 2021-12-03 DIAGNOSIS — Z171 Estrogen receptor negative status [ER-]: Secondary | ICD-10-CM | POA: Insufficient documentation

## 2021-12-03 DIAGNOSIS — C50919 Malignant neoplasm of unspecified site of unspecified female breast: Secondary | ICD-10-CM

## 2021-12-03 DIAGNOSIS — Z79899 Other long term (current) drug therapy: Secondary | ICD-10-CM | POA: Diagnosis not present

## 2021-12-03 DIAGNOSIS — E876 Hypokalemia: Secondary | ICD-10-CM | POA: Insufficient documentation

## 2021-12-03 LAB — CBC WITH DIFFERENTIAL/PLATELET
Abs Immature Granulocytes: 0.03 10*3/uL (ref 0.00–0.07)
Basophils Absolute: 0 10*3/uL (ref 0.0–0.1)
Basophils Relative: 0 %
Eosinophils Absolute: 0.2 10*3/uL (ref 0.0–0.5)
Eosinophils Relative: 2 %
HCT: 36.9 % (ref 36.0–46.0)
Hemoglobin: 12.8 g/dL (ref 12.0–15.0)
Immature Granulocytes: 0 %
Lymphocytes Relative: 29 %
Lymphs Abs: 2.3 10*3/uL (ref 0.7–4.0)
MCH: 29.9 pg (ref 26.0–34.0)
MCHC: 34.7 g/dL (ref 30.0–36.0)
MCV: 86.2 fL (ref 80.0–100.0)
Monocytes Absolute: 0.6 10*3/uL (ref 0.1–1.0)
Monocytes Relative: 8 %
Neutro Abs: 4.8 10*3/uL (ref 1.7–7.7)
Neutrophils Relative %: 61 %
Platelets: 265 10*3/uL (ref 150–400)
RBC: 4.28 MIL/uL (ref 3.87–5.11)
RDW: 13.2 % (ref 11.5–15.5)
WBC: 7.9 10*3/uL (ref 4.0–10.5)
nRBC: 0 % (ref 0.0–0.2)

## 2021-12-03 LAB — COMPREHENSIVE METABOLIC PANEL
ALT: 30 U/L (ref 0–44)
AST: 21 U/L (ref 15–41)
Albumin: 3.9 g/dL (ref 3.5–5.0)
Alkaline Phosphatase: 73 U/L (ref 38–126)
Anion gap: 6 (ref 5–15)
BUN: 16 mg/dL (ref 6–20)
CO2: 25 mmol/L (ref 22–32)
Calcium: 9.1 mg/dL (ref 8.9–10.3)
Chloride: 103 mmol/L (ref 98–111)
Creatinine, Ser: 0.88 mg/dL (ref 0.44–1.00)
GFR, Estimated: >60 mL/min (ref >60)
Glucose, Bld: 157 mg/dL — ABNORMAL HIGH (ref 70–99)
Potassium: 3.4 mmol/L — ABNORMAL LOW (ref 3.5–5.1)
Sodium: 134 mmol/L — ABNORMAL LOW (ref 135–145)
Total Bilirubin: 0.3 mg/dL (ref 0.3–1.2)
Total Protein: 7 g/dL (ref 6.5–8.1)

## 2021-12-03 LAB — TSH: TSH: 1.504 u[IU]/mL (ref 0.350–4.500)

## 2021-12-03 LAB — MAGNESIUM: Magnesium: 1.6 mg/dL — ABNORMAL LOW (ref 1.7–2.4)

## 2021-12-03 LAB — VITAMIN D 25 HYDROXY (VIT D DEFICIENCY, FRACTURES): Vit D, 25-Hydroxy: 44.97 ng/mL (ref 30–100)

## 2021-12-03 MED ORDER — SODIUM CHLORIDE 0.9% FLUSH
10.0000 mL | INTRAVENOUS | Status: DC | PRN
  Administered 2021-12-03: 10 mL via INTRAVENOUS

## 2021-12-03 MED ORDER — HEPARIN SOD (PORK) LOCK FLUSH 100 UNIT/ML IV SOLN
500.0000 [IU] | Freq: Once | INTRAVENOUS | Status: AC
  Administered 2021-12-03: 500 [IU] via INTRAVENOUS

## 2021-12-03 NOTE — Progress Notes (Signed)
Elizabeth Graves presented for Portacath access and flush with labs.  Portacath located right chest wall accessed with  H 20 needle.  Good blood return present. Portacath flushed with 23ml NS and 500U/19ml Heparin and needle removed intact. No bruising or swelling noted at the site. Procedure tolerated well and without incident.     Vital signs stable. No complaints at this time. Discharged from clinic ambulatory in stable condition. Alert and oriented x 3. F/U with [facility] as scheduled.

## 2021-12-03 NOTE — Patient Instructions (Signed)
[facility]  Discharge Instructions: Thank you for choosing [facility] to provide your oncology and hematology care.  If you have a lab appointment with the [facility], please come in thru the Main Entrance and check in at the main information desk.  Wear comfortable clothing and clothing appropriate for easy access to any Portacath or PICC line.   We strive to give you quality time with your provider. You may need to reschedule your appointment if you arrive late (15 or more minutes).  Arriving late affects you and other patients whose appointments are after yours.  Also, if you miss three or more appointments without notifying the office, you may be dismissed from the clinic at the provider's discretion.      For prescription refill requests, have your pharmacy contact our office and allow 72 hours for refills to be completed.    Today you received port flush with labs.   BELOW ARE SYMPTOMS THAT SHOULD IMMEDIATELY: *FEVER GREATER THAN 100.4 F (38 C) OR HIGHER *CHILLS OR SWEATING *NAUSEA AND VOMITING THAT IS NOT CONTROLLED WITH YOUR NAUSEA MEDICATION *UNUSUAL SHORTNESS OF BREATH *UNUSUAL BRUISING OR BLEEDING *URINARY PROBLEMS (pain or burning when urinating, or frequent urination) *BOWEL PROBLEMS (unusual diarrhea, constipation, pain near the anus) TENDERNESS IN MOUTH AND THROAT WITH OR WITHOUT PRESENCE OF ULCERS (sore throat, sores in mouth, or a toothache) UNUSUAL RASH, SWELLING OR PAIN  UNUSUAL VAGINAL DISCHARGE OR ITCHING   Items with * indicate a potential emergency and should be followed up as soon as possible or go to the Emergency Department if any problems should occur.  Please show the CHEMOTHERAPY ALERT CARD or IMMUNOTHERAPY ALERT CARD at check-in to the Emergency Department and triage nurse.  Should you have questions after your visit or need to cancel or reschedule your appointment, please contact [facility] [phone]  and  follow the prompts.  Office hours are 8:00 a.m. to 4:30 p.m. Monday - Friday. Please note that voicemails left after 4:00 p.m. may not be returned until the following business day.  We are closed weekends and major holidays. You have access to a nurse at all times for urgent questions. Please call the main number to the clinic [phone] and follow the prompts.  For any non-urgent questions, you may also contact your provider using MyChart. We now offer e-Visits for anyone 24 and older to request care online for non-urgent symptoms. For details visit [email or web address].   Also download the MyChart app! Go to the app store, search "MyChart", open the app, select [facility], and log in with your MyChart username and password.  Due to Covid, a mask is required upon entering the hospital/clinic. If you do not have a mask, one will be given to you upon arrival. For doctor visits, patients may have 1 support person aged 85 or older with them. For treatment visits, patients cannot have anyone with them due to current Covid guidelines and our immunocompromised population.

## 2021-12-05 NOTE — Progress Notes (Signed)
[facility] [address] [place] [address]   Patient Care Team: Babs Sciara, MD as PCP - General (Family Medicine) Therese Sarah, RN as Oncology Nurse Navigator (Oncology) Mickie Bail, RN as Oncology Nurse Navigator (Oncology)  SUMMARY OF ONCOLOGIC HISTORY: Oncology History Overview Note  Cancer Staging Triple negative malignant neoplasm of breast [facility]) Staging form: Breast, AJCC 8th Edition - Clinical stage from 06/20/2020: Stage IIIB (cT2, cN1, cM0, G2, ER-, PR-, HER2-) - Signed by Malachy Mood, MD on 06/05/2021 Stage prefix: Initial diagnosis Histologic grading system: 3 grade system - Pathologic stage from 02/09/2021: No Stage Recommended (ypT1a, pN0, cM0, G2, ER-, PR-, HER2-) - Signed by Malachy Mood, MD on 06/05/2021 Stage prefix: Post-therapy Response to neoadjuvant therapy: Partial response Histologic grading system: 3 grade system    Triple negative malignant neoplasm of breast (HCC)  06/20/2020 Cancer Staging   Staging form: Breast, AJCC 8th Edition - Clinical stage from 06/20/2020: Stage IIIB (cT2, cN1, cM0, G2, ER-, PR-, HER2-) - Signed by Malachy Mood, MD on 06/05/2021 Stage prefix: Initial diagnosis Histologic grading system: 3 grade system    07/05/2020 Initial Diagnosis   Triple negative malignant neoplasm of breast (HCC)   08/01/2020 - 12/20/2020 Chemotherapy      Patient is on Antibody Plan: HEAD/NECK PEMBROLIZUMAB Q21D     08/17/2020 Genetic Testing   Negative genetic testing:  No pathogenic variants detected on the [employer] CustomNext-Cancer + RNAinsight panel. A variant of uncertain significance (VUS) was detected in the MSH2 gene called c.1600C>T (p.R534C). The report date is 08/17/2020.  The CustomNext-Cancer+RNAinsight panel offered by [employer] includes sequencing and rearrangement analysis for up to 91 genes, which included the following 47 genes for Elizabeth Graves:  APC*, ATM*, AXIN2, BARD1, BMPR1A, BRCA1*, BRCA2*, BRIP1*, CDH1*, CDK4,  CDKN2A, CHEK2*, DICER1, EPCAM, GREM1, HOXB13, MEN1, MLH1*, MSH2*, MSH3, MSH6*, MUTYH*, NBN, NF1*, NF2, NTHL1, PALB2*, PMS2*, POLD1, POLE, PTEN*, RAD51C*, RAD51D*, RECQL, RET, SDHA, SDHAF2, SDHB, SDHC, SDHD, SMAD4, SMARCA4, STK11, TP53*, TSC1, TSC2, and VHL.  DNA and RNA analyses performed for * genes.     02/09/2021 Cancer Staging   Staging form: Breast, AJCC 8th Edition - Pathologic stage from 02/09/2021: No Stage Recommended (ypT1a, pN0, cM0, G2, ER-, PR-, HER2-) - Signed by Malachy Mood, MD on 06/05/2021 Stage prefix: Post-therapy Response to neoadjuvant therapy: Partial response Histologic grading system: 3 grade system    06/18/2021 -  Chemotherapy      Patient is on Antibody Plan: HEAD/NECK PEMBROLIZUMAB Q21D     Drug-induced neutropenia (HCC)    CHIEF COMPLIANT: Follow-up for left breast cancer   INTERVAL HISTORY: Elizabeth Graves is a 55 y.o. female here today for follow up of her left breast cancer. Her last visit was on 08/07/2021.   Today she reports feeling good. She has resumed working. She reports feeling an aching pulling sensation in her left chest upon movement since resuming work. She latest colonoscopy was on 11/18. She denies tingling/numbness.   REVIEW OF SYSTEMS:   Review of Systems  Constitutional:  Negative for appetite change and fatigue (75%).  Cardiovascular:  Positive for chest pain (7/10).  Musculoskeletal:  Positive for arthralgias (7/10 L shoulder).  Neurological:  Negative for numbness.  All other systems reviewed and are negative.  I have reviewed the past medical history, past surgical history, social history and family history with the patient and they are unchanged from previous note.   ALLERGIES:   has No Known Allergies.  MEDICATIONS:  Current Outpatient Medications  Medication Sig Dispense Refill   atorvastatin (LIPITOR) 20 MG tablet Take 1 tablet (20 mg total) by mouth daily. 90 tablet 1   calcium carbonate (OSCAL) 1500 (600 Ca) MG TABS  tablet Take 600 mg of elemental calcium by mouth in the morning.     diphenhydrAMINE (BENADRYL) 25 mg capsule Take 25 mg by mouth as needed for allergies. Allergies.     esomeprazole (NEXIUM) 20 MG capsule Take 20 mg by mouth daily before breakfast.     ibuprofen (ADVIL,MOTRIN) 200 MG tablet Take 400-600 mg by mouth as needed for moderate pain (pain.).     lisinopril (ZESTRIL) 5 MG tablet Take 1 tablet (5 mg total) by mouth daily. 90 tablet 1   loratadine (CLARITIN) 10 MG tablet Take 10 mg by mouth in the morning.     naproxen sodium (ALEVE) 220 MG tablet Take 440 mg by mouth as needed (pain.).     No current facility-administered medications for this visit.     PHYSICAL EXAMINATION: Performance status (ECOG): 0 - Asymptomatic  Vitals:   12/06/21 1528  BP: 124/72  Pulse: 96  Resp: 20  Temp: (!) 97.5 F (36.4 C)  SpO2: 97%   Wt Readings from Last 3 Encounters:  12/06/21 161 lb 4.8 oz (73.2 kg)  11/16/21 161 lb (73 kg)  10/24/21 161 lb (73 kg)   Physical Exam Vitals reviewed.  Constitutional:      Appearance: Normal appearance.  Cardiovascular:     Rate and Rhythm: Normal rate and regular rhythm.     Pulses: Normal pulses.     Heart sounds: Normal heart sounds.  Pulmonary:     Effort: Pulmonary effort is normal.     Breath sounds: Normal breath sounds.  Chest:  Breasts:    Right: Normal. No swelling, bleeding, inverted nipple, mass, nipple discharge, skin change or tenderness.     Left: Absent. No mass, skin change (mastectomy site within normal limits) or tenderness.  Abdominal:     Palpations: Abdomen is soft. There is no hepatomegaly, splenomegaly or mass.     Tenderness: There is no abdominal tenderness.  Musculoskeletal:     Right lower leg: No edema.     Left lower leg: No edema.  Lymphadenopathy:     Upper Body:     Right upper body: No supraclavicular, axillary or pectoral adenopathy.     Left upper body: No supraclavicular, axillary or pectoral adenopathy.      Lower Body: No right inguinal adenopathy. No left inguinal adenopathy.  Neurological:     General: No focal deficit present.     Mental Status: She is alert and oriented to person, place, and time.  Psychiatric:        Mood and Affect: Mood normal.        Behavior: Behavior normal.    Breast Exam Chaperone: Thana Ates     LABORATORY DATA:  I have reviewed the data as listed CMP Latest Ref Rng & Units 12/03/2021 08/07/2021 07/05/2021  Glucose 70 - 99 mg/dL 157(H) 144(H) 217(H)  BUN 6 - 20 mg/dL $Remove'16 16 15  'PcVYuzB$ Creatinine 0.44 - 1.00 mg/dL 0.88 0.95 1.03(H)  Sodium 135 - 145 mmol/L 134(L) 139 137  Potassium 3.5 - 5.1 mmol/L 3.4(L) 4.6 3.9  Chloride 98 - 111 mmol/L 103 104 102  CO2 22 - 32 mmol/L $RemoveB'25 26 25  'OaiQgPNI$ Calcium 8.9 - 10.3 mg/dL 9.1 9.6 9.4  Total Protein 6.5 - 8.1 g/dL 7.0  7.2 7.2  Total Bilirubin 0.3 - 1.2 mg/dL 0.3 0.5 0.5  Alkaline Phos 38 - 126 U/L 73 95 96  AST 15 - 41 U/L 21 34 41  ALT 0 - 44 U/L 30 39 40   Lab Results  Component Value Date   CAN153 17.6 07/05/2020   Lab Results  Component Value Date   WBC 7.9 12/03/2021   HGB 12.8 12/03/2021   HCT 36.9 12/03/2021   MCV 86.2 12/03/2021   PLT 265 12/03/2021   NEUTROABS 4.8 12/03/2021    ASSESSMENT:  1.  Triple negative left breast cancer (T2N1): -Patient identified left breast mass with left nipple retraction about 2 months ago. -Mammogram on 06/13/2020 showed mass in the retroareolar 12 o'clock position in the left breast.  2 suspicious left axillary lymph nodes, largest measuring 2 cm. -Breast ultrasound showed 2.7 x 1.6 x 2.2 cm mass in the 12:00 retroareolar position.  Just lateral to the mass is a calcified hypoechoic nodule that corresponds to densely calcified degenerating fibroadenoma. -Left breast 12:00 biopsy showed invasive ductal carcinoma, grade 2/3.  Left axillary lymph node biopsy was consistent with meta stasis. -ER negative, PR negative and Ki-67 15%.  HER-2 equivocal by IHC.  HER-2 FISH  negative. -MRI of the breast on 07/19/2020 shows 4.2 cm enhancing mass in the subareolar left breast, enhancement extending to the level of the left nipple with associated nipple retraction and periareolar skin thickening.  6 morphologically abnormal level 1 left axillary lymph nodes.  No evidence of malignancy in the right breast. -CT CAP on 07/14/2020 shows dominant left axillary nodes measuring 1.8 cm short axis.  Small left subpectoral nodes measuring up to 7 mm short axis, suspicious.  Small mediastinal lymph nodes measuring 7 to 9 mm, thought to be reactive.  No other evidence of metastatic disease. -Neoadjuvant Dose dense AC started on 08/01/2020. -11 cycles of carboplatin and Taxol she completed on 12/20/2020. -Left mastectomy and axillary lymph node biopsy on 02/09/2021-pathology with 0.2 cm grade 2 IDC, margins negative, 0/12 lymph nodes involved, [id number]. - 6 cycles of adjuvant capecitabine from 03/20/2021 through 07/04/2021. [employer] has denied request for pembrolizumab based on keynote study.   2.  Family history: -Father had gastroesophageal junction cancer.  Maternal grandmother had throat cancer and paternal aunt had breast cancer. -[facility] genetics testing showed MSH2 VUS.   3.  High risk drug monitoring: -Echo on 07/12/2020 shows EF 60 to 65%.   PLAN:  1.  T2N1 left breast TNBC: - She has completed 6 cycles of adjuvant Xeloda followed by radiation therapy. - We reviewed mammogram of the right breast from 08/20/2021 BI-RADS Category 2. - Physical examination of the left mastectomy is within normal limits.  Right breast has no palpable masses. - Reviewed labs from 12/03/2021 which showed normal chemistries and CBC.  Vitamin D was normal.  TSH was 1.54. - RTC 4 months for follow-up with repeat labs including tumor markers.    2.  Family history: - Germline mutation testing was negative.  3.  Hypokalemia: - Potassium is 3.4. - She is no longer taking potassium.   4.   Hypomagnesemia: - Magnesium is low at 1.6. - She was told to start magnesium once daily.    Breast Cancer therapy associated bone loss: I have recommended calcium, Vitamin D and weight bearing exercises.  Orders placed this encounter:  Orders Placed This Encounter  Procedures   CBC with Differential/Platelet   Comprehensive metabolic panel   Magnesium  Cancer antigen 27.29   Cancer antigen 15-3     The patient has a good understanding of the overall plan. She agrees with it. She will call with any problems that may develop before the next visit here.  Derek Jack, MD [place] [phone]   I, Thana Ates, am acting as a scribe for Dr. Derek Jack.  I, Derek Jack MD, have reviewed the above documentation for accuracy and completeness, and I agree with the above.

## 2021-12-06 ENCOUNTER — Inpatient Hospital Stay (HOSPITAL_BASED_OUTPATIENT_CLINIC_OR_DEPARTMENT_OTHER): Payer: 59 | Admitting: Hematology

## 2021-12-06 ENCOUNTER — Other Ambulatory Visit: Payer: Self-pay

## 2021-12-06 VITALS — BP 124/72 | HR 96 | Temp 97.5°F | Resp 20 | Ht <= 58 in | Wt 161.3 lb

## 2021-12-06 DIAGNOSIS — C50012 Malignant neoplasm of nipple and areola, left female breast: Secondary | ICD-10-CM

## 2021-12-06 DIAGNOSIS — C50919 Malignant neoplasm of unspecified site of unspecified female breast: Secondary | ICD-10-CM

## 2021-12-06 DIAGNOSIS — Z171 Estrogen receptor negative status [ER-]: Secondary | ICD-10-CM

## 2021-12-06 NOTE — Patient Instructions (Signed)
[place] at [facility] Discharge Instructions  You were seen and examined today by Dr. Delton Coombes. He reviewed your most recent labs and everything looks okay. Your magnesium is slightly low. He wants you to start taking magnesium over the counter once daily. Continue taking the Calcium and Vitamin D over the counter once daily. Please follow up as scheduled in 4 months.   Thank you for choosing [place] at [facility] to provide your oncology and hematology care.  To afford each patient quality time with our provider, please arrive at least 15 minutes before your scheduled appointment time.   If you have a lab appointment with the [facility] please come in thru the Main Entrance and check in at the main information desk.  You need to re-schedule your appointment should you arrive 10 or more minutes late.  We strive to give you quality time with our providers, and arriving late affects you and other patients whose appointments are after yours.  Also, if you no show three or more times for appointments you may be dismissed from the clinic at the providers discretion.     Again, thank you for choosing [facility].  Our hope is that these requests will decrease the amount of time that you wait before being seen by our physicians.       _____________________________________________________________  Should you have questions after your visit to [facility], please contact our office at [phone] and follow the prompts.  Our office hours are 8:00 a.m. and 4:30 p.m. Monday - Friday.  Please note that voicemails left after 4:00 p.m. may not be returned until the following business day.  We are closed weekends and major holidays.  You do have access to a nurse 24-7, just call the main number to the clinic [phone] and do not press any options, hold on the line and a nurse will answer the phone.    For prescription refill  requests, have your pharmacy contact our office and allow 72 hours.    Due to Covid, you will need to wear a mask upon entering the hospital. If you do not have a mask, a mask will be given to you at the Main Entrance upon arrival. For doctor visits, patients may have 1 support person age 35 or older with them. For treatment visits, patients can not have anyone with them due to social distancing guidelines and our immunocompromised population.

## 2021-12-24 ENCOUNTER — Encounter (HOSPITAL_COMMUNITY): Payer: Self-pay | Admitting: Hematology

## 2021-12-24 ENCOUNTER — Encounter (HOSPITAL_COMMUNITY): Payer: Self-pay | Admitting: Hematology and Oncology

## 2021-12-27 ENCOUNTER — Encounter (HOSPITAL_COMMUNITY): Payer: Self-pay | Admitting: Hematology

## 2021-12-27 ENCOUNTER — Encounter (HOSPITAL_COMMUNITY): Payer: Self-pay | Admitting: Hematology and Oncology

## 2022-02-23 ENCOUNTER — Other Ambulatory Visit: Payer: Self-pay | Admitting: Family Medicine

## 2022-03-18 ENCOUNTER — Ambulatory Visit (INDEPENDENT_AMBULATORY_CARE_PROVIDER_SITE_OTHER): Payer: Self-pay | Admitting: Family Medicine

## 2022-03-18 ENCOUNTER — Other Ambulatory Visit: Payer: Self-pay

## 2022-03-18 VITALS — BP 136/72 | HR 87 | Temp 98.8°F | Ht <= 58 in | Wt 161.0 lb

## 2022-03-18 DIAGNOSIS — R7303 Prediabetes: Secondary | ICD-10-CM

## 2022-03-18 DIAGNOSIS — E785 Hyperlipidemia, unspecified: Secondary | ICD-10-CM

## 2022-03-18 DIAGNOSIS — I10 Essential (primary) hypertension: Secondary | ICD-10-CM

## 2022-03-18 DIAGNOSIS — R739 Hyperglycemia, unspecified: Secondary | ICD-10-CM

## 2022-03-18 MED ORDER — ATORVASTATIN CALCIUM 20 MG PO TABS: 20.0000 mg | ORAL_TABLET | Freq: Every day | ORAL | 1 refills | Status: DC

## 2022-03-18 MED ORDER — LISINOPRIL 5 MG PO TABS: 5.0000 mg | ORAL_TABLET | Freq: Every day | ORAL | 1 refills | Status: DC

## 2022-03-18 NOTE — Progress Notes (Signed)
? ?  Subjective:  ? ? Patient ID: Elizabeth Graves, female    DOB: 1966/02/07, 56 y.o.   MRN: [id number] ? ?Hypertension ?This is a chronic problem. Treatments tried: lisinopril.  ?Hyperlipidemia ?Taking atorvastatin ?Is on blood pressure meds ?Also tries to watch her diet ?States that her glucose readings at oncology have not been as high ?She is no longer smoking ?Breathing overall doing well ?Energy level overall doing okay ?Has history of breast cancer ? ?Prediabetes previous A1c was in a diabetic range patient has not done her follow-up blood work she will do it next week and send [place] the results ? ? ?Review of Systems ? ?   ?Objective:  ? Physical Exam ? ?General-in no acute distress ?Eyes-no discharge ?Lungs-respiratory rate normal, CTA ?CV-no murmurs,RRR ?Extremities skin warm dry no edema ?Neuro grossly normal ?Behavior normal, alert ? ? ? ?   ?Assessment & Plan:  ?Prediabetes-previous A1c was in a diabetic range but she feels that that was due to her chemotherapy.  She feels things are going much better currently if her A1c is at 6.5 or more she will need to be on medications.  Patient did not do her blood work previously as asked but states she will do it next week ? ?Hyperlipidemia takes statin tolerating well check lab work ? ?Blood pressure good response currently healthy eating minimize salt stay active fit in walking repeat lab work next week as planned ? ?Follow-up in 6 months sooner problems ? ?

## 2022-03-25 ENCOUNTER — Other Ambulatory Visit (HOSPITAL_COMMUNITY): Payer: 59

## 2022-03-25 ENCOUNTER — Inpatient Hospital Stay (HOSPITAL_COMMUNITY): Payer: Self-pay | Attending: Hematology

## 2022-03-25 ENCOUNTER — Other Ambulatory Visit: Payer: Self-pay

## 2022-03-25 DIAGNOSIS — Z923 Personal history of irradiation: Secondary | ICD-10-CM | POA: Insufficient documentation

## 2022-03-25 DIAGNOSIS — C50012 Malignant neoplasm of nipple and areola, left female breast: Secondary | ICD-10-CM | POA: Insufficient documentation

## 2022-03-25 DIAGNOSIS — E222 Syndrome of inappropriate secretion of antidiuretic hormone: Secondary | ICD-10-CM

## 2022-03-25 DIAGNOSIS — E876 Hypokalemia: Secondary | ICD-10-CM | POA: Insufficient documentation

## 2022-03-25 DIAGNOSIS — Z9221 Personal history of antineoplastic chemotherapy: Secondary | ICD-10-CM | POA: Insufficient documentation

## 2022-03-25 DIAGNOSIS — Z9012 Acquired absence of left breast and nipple: Secondary | ICD-10-CM | POA: Insufficient documentation

## 2022-03-25 DIAGNOSIS — Z79899 Other long term (current) drug therapy: Secondary | ICD-10-CM | POA: Insufficient documentation

## 2022-03-25 DIAGNOSIS — C50919 Malignant neoplasm of unspecified site of unspecified female breast: Secondary | ICD-10-CM

## 2022-03-25 DIAGNOSIS — Z171 Estrogen receptor negative status [ER-]: Secondary | ICD-10-CM | POA: Insufficient documentation

## 2022-03-25 DIAGNOSIS — Z803 Family history of malignant neoplasm of breast: Secondary | ICD-10-CM | POA: Insufficient documentation

## 2022-03-25 DIAGNOSIS — Z800 Family history of malignant neoplasm of digestive organs: Secondary | ICD-10-CM | POA: Insufficient documentation

## 2022-03-25 LAB — COMPREHENSIVE METABOLIC PANEL
ALT: 27 U/L (ref 0–44)
AST: 18 U/L (ref 15–41)
Albumin: 4 g/dL (ref 3.5–5.0)
Alkaline Phosphatase: 89 U/L (ref 38–126)
Anion gap: 6 (ref 5–15)
BUN: 17 mg/dL (ref 6–20)
CO2: 25 mmol/L (ref 22–32)
Calcium: 9 mg/dL (ref 8.9–10.3)
Chloride: 105 mmol/L (ref 98–111)
Creatinine, Ser: 0.89 mg/dL (ref 0.44–1.00)
GFR, Estimated: >60 mL/min (ref >60)
Glucose, Bld: 155 mg/dL — ABNORMAL HIGH (ref 70–99)
Potassium: 3.9 mmol/L (ref 3.5–5.1)
Sodium: 136 mmol/L (ref 135–145)
Total Bilirubin: 0.4 mg/dL (ref 0.3–1.2)
Total Protein: 7.4 g/dL (ref 6.5–8.1)

## 2022-03-25 LAB — HEMOGLOBIN A1C
Hgb A1c MFr Bld: 7.1 % — ABNORMAL HIGH (ref 4.8–5.6)
Mean Plasma Glucose: 157.07 mg/dL

## 2022-03-25 LAB — LIPID PANEL
Cholesterol: 148 mg/dL (ref 0–200)
HDL: 33 mg/dL — ABNORMAL LOW (ref >40)
LDL Cholesterol: 74 mg/dL (ref 0–99)
Total CHOL/HDL Ratio: 4.5 RATIO
Triglycerides: 206 mg/dL — ABNORMAL HIGH (ref <150)
VLDL: 41 mg/dL — ABNORMAL HIGH (ref 0–40)

## 2022-03-25 LAB — CBC WITH DIFFERENTIAL/PLATELET
Abs Immature Granulocytes: 0.03 10*3/uL (ref 0.00–0.07)
Basophils Absolute: 0 10*3/uL (ref 0.0–0.1)
Basophils Relative: 0 %
Eosinophils Absolute: 0.3 10*3/uL (ref 0.0–0.5)
Eosinophils Relative: 4 %
HCT: 41.4 % (ref 36.0–46.0)
Hemoglobin: 14 g/dL (ref 12.0–15.0)
Immature Granulocytes: 0 %
Lymphocytes Relative: 25 %
Lymphs Abs: 1.7 10*3/uL (ref 0.7–4.0)
MCH: 29.2 pg (ref 26.0–34.0)
MCHC: 33.8 g/dL (ref 30.0–36.0)
MCV: 86.3 fL (ref 80.0–100.0)
Monocytes Absolute: 0.5 10*3/uL (ref 0.1–1.0)
Monocytes Relative: 8 %
Neutro Abs: 4.3 10*3/uL (ref 1.7–7.7)
Neutrophils Relative %: 63 %
Platelets: 257 10*3/uL (ref 150–400)
RBC: 4.8 MIL/uL (ref 3.87–5.11)
RDW: 13.5 % (ref 11.5–15.5)
WBC: 6.9 10*3/uL (ref 4.0–10.5)
nRBC: 0 % (ref 0.0–0.2)

## 2022-03-25 LAB — MAGNESIUM: Magnesium: 1.9 mg/dL (ref 1.7–2.4)

## 2022-03-25 MED ORDER — SODIUM CHLORIDE 0.9% FLUSH: 10.0000 mL | INTRAVENOUS | Status: DC | PRN

## 2022-03-25 MED ORDER — HEPARIN SOD (PORK) LOCK FLUSH 100 UNIT/ML IV SOLN
500.0000 [IU] | Freq: Once | INTRAVENOUS | Status: AC
  Administered 2022-03-25: 500 [IU] via INTRAVENOUS

## 2022-03-25 MED ORDER — HEPARIN SOD (PORK) LOCK FLUSH 100 UNIT/ML IV SOLN: 500.0000 [IU] | Freq: Once | INTRAVENOUS | Status: DC

## 2022-03-25 MED ORDER — SODIUM CHLORIDE 0.9% FLUSH
10.0000 mL | INTRAVENOUS | Status: DC | PRN
  Administered 2022-03-25: 10 mL

## 2022-03-25 NOTE — Progress Notes (Signed)
Elizabeth Graves presented for Portacath access and flush with labs. Portacath located right chest wall accessed with  H 20 needle.  Good blood return present. Portacath flushed with 52m NS and 500U/547mHeparin and needle removed intact.  No bruising or swelling noted at the site. Procedure tolerated well and without incident.    ? ?Discharged from clinic ambulatory in stable condition. Alert and oriented x 3. [facility]   ?

## 2022-03-26 ENCOUNTER — Other Ambulatory Visit: Payer: Self-pay | Admitting: *Deleted

## 2022-03-26 DIAGNOSIS — I10 Essential (primary) hypertension: Secondary | ICD-10-CM

## 2022-03-26 DIAGNOSIS — Z79899 Other long term (current) drug therapy: Secondary | ICD-10-CM

## 2022-03-26 DIAGNOSIS — R7303 Prediabetes: Secondary | ICD-10-CM

## 2022-03-26 LAB — CANCER ANTIGEN 15-3: CA 15-3: 18.3 U/mL (ref 0.0–25.0)

## 2022-03-26 LAB — CANCER ANTIGEN 27.29: CA 27.29: 18.6 U/mL (ref 0.0–38.6)

## 2022-03-26 MED ORDER — METFORMIN HCL 500 MG PO TABS: ORAL_TABLET | ORAL | 1 refills | Status: DC

## 2022-04-04 ENCOUNTER — Inpatient Hospital Stay (HOSPITAL_COMMUNITY): Payer: Self-pay | Attending: Hematology | Admitting: Hematology

## 2022-04-04 VITALS — BP 115/76 | HR 97 | Temp 98.4°F | Resp 18 | Ht 61.06 in | Wt 162.0 lb

## 2022-04-04 DIAGNOSIS — Z171 Estrogen receptor negative status [ER-]: Secondary | ICD-10-CM | POA: Insufficient documentation

## 2022-04-04 DIAGNOSIS — Z9221 Personal history of antineoplastic chemotherapy: Secondary | ICD-10-CM | POA: Insufficient documentation

## 2022-04-04 DIAGNOSIS — Z17421 Hormone receptor negative with human epidermal growth factor receptor 2 negative status: Secondary | ICD-10-CM

## 2022-04-04 DIAGNOSIS — Z79899 Other long term (current) drug therapy: Secondary | ICD-10-CM | POA: Insufficient documentation

## 2022-04-04 DIAGNOSIS — C50919 Malignant neoplasm of unspecified site of unspecified female breast: Secondary | ICD-10-CM

## 2022-04-04 DIAGNOSIS — E876 Hypokalemia: Secondary | ICD-10-CM | POA: Insufficient documentation

## 2022-04-04 DIAGNOSIS — Z800 Family history of malignant neoplasm of digestive organs: Secondary | ICD-10-CM | POA: Insufficient documentation

## 2022-04-04 DIAGNOSIS — Z803 Family history of malignant neoplasm of breast: Secondary | ICD-10-CM | POA: Insufficient documentation

## 2022-04-04 DIAGNOSIS — C50012 Malignant neoplasm of nipple and areola, left female breast: Secondary | ICD-10-CM | POA: Insufficient documentation

## 2022-04-04 DIAGNOSIS — Z923 Personal history of irradiation: Secondary | ICD-10-CM | POA: Insufficient documentation

## 2022-04-04 DIAGNOSIS — Z9012 Acquired absence of left breast and nipple: Secondary | ICD-10-CM | POA: Insufficient documentation

## 2022-04-04 NOTE — Progress Notes (Signed)
? ?[facility] ?[address] ?[place], [place] [address] ? ? ?Patient Care Team: ?Kathyrn Drown, MD as PCP - General (Family Medicine) ?Brien Mates, RN as Oncology Nurse Navigator (Oncology) ?Donetta Potts, RN as Oncology Nurse Navigator (Oncology) ? ?SUMMARY OF ONCOLOGIC HISTORY: ?Oncology History Overview Note  ?Cancer Staging ?Triple negative malignant neoplasm of breast ([place]) ?Staging form: Breast, AJCC 8th Edition ?- Clinical stage from 06/20/2020: Stage IIIB (cT2, cN1, cM0, G2, ER-, PR-, HER2-) - Signed by Truitt Merle, MD on 06/05/2021 ?Stage prefix: Initial diagnosis ?Histologic grading system: 3 grade system ?- Pathologic stage from 02/09/2021: No Stage Recommended (ypT1a, pN0, cM0, G2, ER-, PR-, HER2-) - Signed by Truitt Merle, MD on 06/05/2021 ?Stage prefix: Post-therapy ?Response to neoadjuvant therapy: Partial response ?Histologic grading system: 3 grade system ? ?  ?Triple negative malignant neoplasm of breast ([place])  ?06/20/2020 Cancer Staging  ? Staging form: Breast, AJCC 8th Edition ?- Clinical stage from 06/20/2020: Stage IIIB (cT2, cN1, cM0, G2, ER-, PR-, HER2-) - Signed by Truitt Merle, MD on 06/05/2021 ?Stage prefix: Initial diagnosis ?Histologic grading system: 3 grade system ? ?  ?07/05/2020 Initial Diagnosis  ? Triple negative malignant neoplasm of breast [facility]) ?  ?08/01/2020 - 12/20/2020 Chemotherapy  ?  ? ? Patient is on Antibody Plan: HEAD/NECK PEMBROLIZUMAB Q21D  ? ?  ?08/17/2020 Genetic Testing  ? Negative genetic testing:  No pathogenic variants detected on the [facility] CustomNext-Cancer + RNAinsight panel. A variant of uncertain significance (VUS) was detected in the MSH2 gene called c.1600C>T (p.R534C). The report date is 08/17/2020. ? ?The CustomNext-Cancer+RNAinsight panel offered by [facility] includes sequencing and rearrangement analysis for up to 91 genes, which included the following 47 genes for Ms. Koc:  APC*, ATM*, AXIN2, BARD1, BMPR1A, BRCA1*, BRCA2*, BRIP1*, CDH1*, CDK4,  CDKN2A, CHEK2*, DICER1, EPCAM, GREM1, HOXB13, MEN1, MLH1*, MSH2*, MSH3, MSH6*, MUTYH*, NBN, NF1*, NF2, NTHL1, PALB2*, PMS2*, POLD1, POLE, PTEN*, RAD51C*, RAD51D*, RECQL, RET, SDHA, SDHAF2, SDHB, SDHC, SDHD, SMAD4, SMARCA4, STK11, TP53*, TSC1, TSC2, and VHL.  DNA and RNA analyses performed for * genes.   ?  ?02/09/2021 Cancer Staging  ? Staging form: Breast, AJCC 8th Edition ?- Pathologic stage from 02/09/2021: No Stage Recommended (ypT1a, pN0, cM0, G2, ER-, PR-, HER2-) - Signed by Truitt Merle, MD on 06/05/2021 ?Stage prefix: Post-therapy ?Response to neoadjuvant therapy: Partial response ?Histologic grading system: 3 grade system ? ?  ?06/18/2021 -  Chemotherapy  ?  ? ? Patient is on Antibody Plan: HEAD/NECK PEMBROLIZUMAB Q21D  ? ?  ?Drug-induced neutropenia ([place])  ? ? ?CHIEF COMPLIANT: Follow-up for left breast cancer ? ? ?INTERVAL HISTORY: Ms. LETISHIA ELLIOTT is a 56 y.o. female here today for follow up of her left breast cancer. Her last visit was on 12/24/2021.  ? ?Today she reports feeling good. She denies new pains. She continues to have soreness on the left side of her chest when she uses her left arm for long periods of time.  ? ?REVIEW OF SYSTEMS:   ?Review of Systems  ?Constitutional:  Negative for appetite change and fatigue.  ?Cardiovascular:  Positive for chest pain.  ?All other systems reviewed and are negative. ? ?I have reviewed the past medical history, past surgical history, social history and family history with the patient and they are unchanged from previous note. ? ? ?ALLERGIES:   ?has No Known Allergies. ? ? ?MEDICATIONS:  ?Current Outpatient Medications  ?Medication Sig Dispense Refill  ? atorvastatin (LIPITOR) 20 MG tablet Take  1 tablet (20 mg total) by mouth daily. 90 tablet 1  ? calcium carbonate (OSCAL) 1500 (600 Ca) MG TABS tablet Take 600 mg of elemental calcium by mouth in the morning.    ? diphenhydrAMINE (BENADRYL) 25 mg capsule Take 25 mg by mouth as needed for allergies. Allergies.    ?  esomeprazole (NEXIUM) 20 MG capsule Take 20 mg by mouth daily before breakfast.    ? ibuprofen (ADVIL,MOTRIN) 200 MG tablet Take 400-600 mg by mouth as needed for moderate pain (pain.).    ? lisinopril (ZESTRIL) 5 MG tablet Take 1 tablet (5 mg total) by mouth daily. 90 tablet 1  ? loratadine (CLARITIN) 10 MG tablet Take 10 mg by mouth in the morning.    ? metFORMIN (GLUCOPHAGE) 500 MG tablet TAKE 1 TABLET BY MOUTH ONCE DAILY 90 tablet 1  ? naproxen sodium (ALEVE) 220 MG tablet Take 440 mg by mouth as needed (pain.).    ? ?No current facility-administered medications for this visit.  ? ? ? ?PHYSICAL EXAMINATION: ?Performance status (ECOG): 0 - Asymptomatic ? ?Vitals:  ? 04/04/22 1523  ?BP: 115/76  ?Pulse: 97  ?Resp: 18  ?Temp: 98.4 ?F (36.9 ?C)  ?SpO2: 98%  ? ?Wt Readings from Last 3 Encounters:  ?04/04/22 162 lb 0.6 oz (73.5 kg)  ?03/18/22 161 lb (73 kg)  ?12/06/21 161 lb 4.8 oz (73.2 kg)  ? ?Physical Exam ?Vitals reviewed.  ?Constitutional:   ?   Appearance: Normal appearance.  ?Cardiovascular:  ?   Rate and Rhythm: Normal rate and regular rhythm.  ?   Pulses: Normal pulses.  ?   Heart sounds: Normal heart sounds.  ?Pulmonary:  ?   Effort: Pulmonary effort is normal.  ?   Breath sounds: Normal breath sounds.  ?Chest:  ?Breasts: ?   Right: No swelling, bleeding, inverted nipple, mass, nipple discharge, skin change or tenderness.  ?   Left: Absent. No swelling, mass, skin change (mastectomy site WNL) or tenderness.  ?   Comments: R side Port-a-cath ?Lymphadenopathy:  ?   Upper Body:  ?   Right upper body: No supraclavicular or axillary adenopathy.  ?   Left upper body: No supraclavicular or axillary adenopathy.  ?Neurological:  ?   General: No focal deficit present.  ?   Mental Status: She is alert and oriented to person, place, and time.  ?Psychiatric:     ?   Mood and Affect: Mood normal.     ?   Behavior: Behavior normal.  ? ? ?Breast Exam Chaperone: Thana Ates   ? ? ?LABORATORY DATA:  ?I have reviewed the data  as listed ? ?  Latest Ref Rng & Units 03/25/2022  ?  8:43 AM 12/03/2021  ?  3:30 PM 08/07/2021  ?  8:47 AM  ?CMP  ?Glucose 70 - 99 mg/dL 155   157   144    ?BUN 6 - 20 mg/dL $Remove'17   16   16    'aAViiMZ$ ?Creatinine 0.44 - 1.00 mg/dL 0.89   0.88   0.95    ?Sodium 135 - 145 mmol/L 136   134   139    ?Potassium 3.5 - 5.1 mmol/L 3.9   3.4   4.6    ?Chloride 98 - 111 mmol/L 105   103   104    ?CO2 22 - 32 mmol/L $RemoveB'25   25   26    'pKxgTqsp$ ?Calcium 8.9 - 10.3 mg/dL 9.0   9.1   9.6    ?  Total Protein 6.5 - 8.1 g/dL 7.4   7.0   7.2    ?Total Bilirubin 0.3 - 1.2 mg/dL 0.4   0.3   0.5    ?Alkaline Phos 38 - 126 U/L 89   73   95    ?AST 15 - 41 U/L 18   21   34    ?ALT 0 - 44 U/L 27   30   39    ? ?Lab Results  ?Component Value Date  ? WYS299 18.3 03/25/2022  ? QSY999 17.6 07/05/2020  ? ?Lab Results  ?Component Value Date  ? WBC 6.9 03/25/2022  ? HGB 14.0 03/25/2022  ? HCT 41.4 03/25/2022  ? MCV 86.3 03/25/2022  ? PLT 257 03/25/2022  ? NEUTROABS 4.3 03/25/2022  ? ? ?ASSESSMENT:  ?1.  Triple negative left breast cancer (T2N1): ?-Patient identified left breast mass with left nipple retraction about 2 months ago. ?-Mammogram on 06/13/2020 showed mass in the retroareolar 12 o'clock position in the left breast.  2 suspicious left axillary lymph nodes, largest measuring 2 cm. ?-Breast ultrasound showed 2.7 x 1.6 x 2.2 cm mass in the 12:00 retroareolar position.  Just lateral to the mass is a calcified hypoechoic nodule that corresponds to densely calcified degenerating fibroadenoma. ?-Left breast 12:00 biopsy showed invasive ductal carcinoma, grade 2/3.  Left axillary lymph node biopsy was consistent with meta stasis. ?-ER negative, PR negative and Ki-67 15%.  HER-2 equivocal by IHC.  HER-2 FISH negative. ?-MRI of the breast on 07/19/2020 shows 4.2 cm enhancing mass in the subareolar left breast, enhancement extending to the level of the left nipple with associated nipple retraction and periareolar skin thickening.  6 morphologically abnormal level 1 left  axillary lymph nodes.  No evidence of malignancy in the right breast. ?-CT CAP on 07/14/2020 shows dominant left axillary nodes measuring 1.8 cm short axis.  Small left subpectoral nodes measuring up to 7 mm shor

## 2022-04-04 NOTE — Patient Instructions (Addendum)
[facility] ?Discharge Instructions ? ?You were seen and examined today by Dr. Katragadda. He reviewed your most recent labs and everything looks good. Please keep follow up appointments as scheduled in 6 months.  ? ? ?Thank you for choosing [facility] at [facility] to provide your oncology and hematology care.  To afford each patient quality time with our provider, please arrive at least 15 minutes before your scheduled appointment time.  ? ?If you have a lab appointment with the Cancer Center please come in thru the Main Entrance and check in at the main information desk. ? ?You need to re-schedule your appointment should you arrive 10 or more minutes late.  We strive to give you quality time with our providers, and arriving late affects you and other patients whose appointments are after yours.  Also, if you no show three or more times for appointments you may be dismissed from the clinic at the providers discretion.     ?Again, thank you for choosing [facility].  Our hope is that these requests will decrease the amount of time that you wait before being seen by our physicians.       ?_____________________________________________________________ ? ?Should you have questions after your visit to [facility], please contact our office at [phone] and follow the prompts.  Our office hours are 8:00 a.m. and 4:30 p.m. Monday - Friday.  Please note that voicemails left after 4:00 p.m. may not be returned until the following business day.  We are closed weekends and major holidays.  You do have access to a nurse 24-7, just call the main number to the clinic [phone] and do not press any options, hold on the line and a nurse will answer the phone.   ? ?For prescription refill requests, have your pharmacy contact our office and allow 72 hours.   ? ?Due to Covid, you will need to wear a mask upon entering the hospital. If you  do not have a mask, a mask will be given to you at the Main Entrance upon arrival. For doctor visits, patients may have 1 support person age 18 or older with them. For treatment visits, patients can not have anyone with them due to social distancing guidelines and our immunocompromised population.  ? ?  ?

## 2022-06-22 ENCOUNTER — Other Ambulatory Visit: Payer: Self-pay | Admitting: Family Medicine

## 2022-06-28 ENCOUNTER — Inpatient Hospital Stay (HOSPITAL_COMMUNITY): Payer: BC Managed Care – PPO | Attending: Hematology

## 2022-06-28 VITALS — BP 112/80 | HR 88 | Temp 97.5°F | Resp 18

## 2022-06-28 DIAGNOSIS — Z9012 Acquired absence of left breast and nipple: Secondary | ICD-10-CM | POA: Insufficient documentation

## 2022-06-28 DIAGNOSIS — Z800 Family history of malignant neoplasm of digestive organs: Secondary | ICD-10-CM | POA: Insufficient documentation

## 2022-06-28 DIAGNOSIS — E876 Hypokalemia: Secondary | ICD-10-CM | POA: Insufficient documentation

## 2022-06-28 DIAGNOSIS — C50012 Malignant neoplasm of nipple and areola, left female breast: Secondary | ICD-10-CM | POA: Diagnosis present

## 2022-06-28 DIAGNOSIS — Z9221 Personal history of antineoplastic chemotherapy: Secondary | ICD-10-CM | POA: Diagnosis not present

## 2022-06-28 DIAGNOSIS — Z95828 Presence of other vascular implants and grafts: Secondary | ICD-10-CM

## 2022-06-28 DIAGNOSIS — Z171 Estrogen receptor negative status [ER-]: Secondary | ICD-10-CM | POA: Diagnosis present

## 2022-06-28 DIAGNOSIS — Z79899 Other long term (current) drug therapy: Secondary | ICD-10-CM | POA: Diagnosis not present

## 2022-06-28 DIAGNOSIS — Z923 Personal history of irradiation: Secondary | ICD-10-CM | POA: Insufficient documentation

## 2022-06-28 DIAGNOSIS — E119 Type 2 diabetes mellitus without complications: Secondary | ICD-10-CM

## 2022-06-28 DIAGNOSIS — Z803 Family history of malignant neoplasm of breast: Secondary | ICD-10-CM | POA: Diagnosis not present

## 2022-06-28 DIAGNOSIS — E785 Hyperlipidemia, unspecified: Secondary | ICD-10-CM

## 2022-06-28 LAB — LIPID PANEL
Cholesterol: 134 mg/dL (ref 0–200)
HDL: 31 mg/dL — ABNORMAL LOW (ref >40)
LDL Cholesterol: 63 mg/dL (ref 0–99)
Total CHOL/HDL Ratio: 4.3 RATIO
Triglycerides: 200 mg/dL — ABNORMAL HIGH (ref <150)
VLDL: 40 mg/dL (ref 0–40)

## 2022-06-28 MED ORDER — SODIUM CHLORIDE 0.9% FLUSH
10.0000 mL | INTRAVENOUS | Status: DC | PRN
  Administered 2022-06-28: 10 mL via INTRAVENOUS

## 2022-06-28 MED ORDER — HEPARIN SOD (PORK) LOCK FLUSH 100 UNIT/ML IV SOLN
500.0000 [IU] | Freq: Once | INTRAVENOUS | Status: AC
  Administered 2022-06-28: 500 [IU] via INTRAVENOUS

## 2022-06-28 NOTE — Progress Notes (Signed)
Patients port flushed without difficulty.  Good blood return noted with no bruising or swelling noted at site.  Band aid applied.  VSS with discharge and left in satisfactory condition with no s/s of distress noted.   

## 2022-06-29 LAB — HEMOGLOBIN A1C
Hgb A1c MFr Bld: 6.8 % — ABNORMAL HIGH (ref 4.8–5.6)
Mean Plasma Glucose: 148 mg/dL

## 2022-07-11 ENCOUNTER — Encounter (HOSPITAL_COMMUNITY): Payer: Self-pay | Admitting: Hematology

## 2022-07-11 ENCOUNTER — Encounter (HOSPITAL_COMMUNITY): Payer: Self-pay | Admitting: Hematology and Oncology

## 2022-07-22 ENCOUNTER — Other Ambulatory Visit: Payer: Self-pay

## 2022-07-30 ENCOUNTER — Other Ambulatory Visit: Payer: Self-pay

## 2022-08-19 ENCOUNTER — Ambulatory Visit (HOSPITAL_COMMUNITY): Payer: Self-pay

## 2022-08-22 ENCOUNTER — Ambulatory Visit (HOSPITAL_COMMUNITY)
Admission: RE | Admit: 2022-08-22 | Discharge: 2022-08-22 | Disposition: A | Discharge status: Home / Self Care | Payer: BC Managed Care – PPO | Source: Ambulatory Visit | Attending: Hematology | Admitting: Hematology

## 2022-08-22 DIAGNOSIS — Z9012 Acquired absence of left breast and nipple: Secondary | ICD-10-CM | POA: Insufficient documentation

## 2022-08-22 DIAGNOSIS — Z853 Personal history of malignant neoplasm of breast: Secondary | ICD-10-CM | POA: Diagnosis not present

## 2022-08-22 DIAGNOSIS — Z1231 Encounter for screening mammogram for malignant neoplasm of breast: Secondary | ICD-10-CM | POA: Diagnosis present

## 2022-08-22 DIAGNOSIS — C50919 Malignant neoplasm of unspecified site of unspecified female breast: Secondary | ICD-10-CM

## 2022-09-18 ENCOUNTER — Ambulatory Visit: Payer: BC Managed Care – PPO | Admitting: Family Medicine

## 2022-09-23 ENCOUNTER — Encounter: Payer: Self-pay | Admitting: Family Medicine

## 2022-09-23 ENCOUNTER — Ambulatory Visit: Payer: BC Managed Care – PPO | Admitting: Family Medicine

## 2022-09-23 VITALS — BP 132/80 | Wt 160.8 lb

## 2022-09-23 DIAGNOSIS — Z23 Encounter for immunization: Secondary | ICD-10-CM | POA: Diagnosis not present

## 2022-09-23 DIAGNOSIS — E119 Type 2 diabetes mellitus without complications: Secondary | ICD-10-CM

## 2022-09-23 DIAGNOSIS — I10 Essential (primary) hypertension: Secondary | ICD-10-CM

## 2022-09-23 DIAGNOSIS — E1169 Type 2 diabetes mellitus with other specified complication: Secondary | ICD-10-CM | POA: Diagnosis not present

## 2022-09-23 DIAGNOSIS — E785 Hyperlipidemia, unspecified: Secondary | ICD-10-CM

## 2022-09-23 MED ORDER — LISINOPRIL 5 MG PO TABS: 5.0000 mg | ORAL_TABLET | Freq: Every day | ORAL | 1 refills | Status: DC

## 2022-09-23 MED ORDER — ATORVASTATIN CALCIUM 20 MG PO TABS: 20.0000 mg | ORAL_TABLET | Freq: Every day | ORAL | 1 refills | Status: DC

## 2022-09-23 MED ORDER — METFORMIN HCL 500 MG PO TABS: ORAL_TABLET | ORAL | 1 refills | Status: DC

## 2022-09-23 NOTE — Progress Notes (Addendum)
   Subjective:    Patient ID: Elizabeth Graves, female    DOB: 1966-11-02, 56 y.o.   MRN: [id number]  HPI Pt arrives to follow up on HTN. Pt states no issues at this time. Pt is taking Lisinopril daily as directed.  Pt last lab work was in June from oncology  HTN (hypertension), benign  Hyperlipidemia associated with type 2 diabetes mellitus ([place])  Need for vaccination - Plan: Flu Vaccine QUAD 6+ mos PF IM (Fluarix Quad PF), Pneumococcal conjugate vaccine 20-valent (Prevnar 20)  Type 2 diabetes mellitus without complication, without long-term current use of insulin ([place]) - Plan: Microalbumin/Creatinine Ratio, Urine All Healthy diet discussed Patient quit smoking  Review of Systems     Objective:   Physical Exam General-in no acute distress Eyes-no discharge Lungs-respiratory rate normal, CTA CV-no murmurs,RRR Extremities skin warm dry no edema Neuro grossly normal Behavior normal, alert        Assessment & Plan:  1. HTN (hypertension), benign HTN- patient seen for follow-up regarding HTN.   Diet, medication compliance, appropriate labs and refills were completed.   Importance of keeping blood pressure under good control to lessen the risk of complications discussed Regular follow-up visits discussed   2. Hyperlipidemia associated with type 2 diabetes mellitus ([facility]) Hyperlipidemia-importance of diet, weight control, activity, compliance with medications discussed.   Recent labs reviewed.   Any additional labs or refills ordered.   Importance of keeping under good control discussed. Regular follow-up visits discussed   3. Need for vaccination Today - Flu Vaccine QUAD 6+ mos PF IM (Fluarix Quad PF) - Pneumococcal conjugate vaccine 20-valent (Prevnar 20)  4. Type 2 diabetes mellitus without complication, without long-term current use of insulin ([place]) The patient was seen today as part of a comprehensive visit for diabetes. The importance of keeping her A1c at or  below 7 range was discussed.  Discussed diet, activity, and medication compliance Emphasized healthy eating primarily with vegetables fruits and if utilizing meats lean meats such as chicken or fish grilled baked broiled Avoid sugary drinks Minimize and avoid processed foods Fit in regular physical activity preferably 25 to 30 minutes 4 times per week Standard follow-up visit recommended.  Patient aware lack of control and follow-up increases risk of diabetic complications. Regular follow-up visits Yearly ophthalmology Yearly foot exam  - Microalbumin/Creatinine Ratio, Urine

## 2022-09-24 ENCOUNTER — Other Ambulatory Visit: Payer: Self-pay

## 2022-09-24 LAB — MICROALBUMIN / CREATININE URINE RATIO
Creatinine, Urine: 102.2 mg/dL
Microalb/Creat Ratio: 4 mg/g creat (ref 0–29)
Microalbumin, Urine: 4.5 ug/mL

## 2022-10-03 ENCOUNTER — Inpatient Hospital Stay: Payer: BC Managed Care – PPO

## 2022-10-04 ENCOUNTER — Inpatient Hospital Stay: Payer: BC Managed Care – PPO | Attending: Hematology

## 2022-10-04 VITALS — BP 138/83 | HR 83 | Temp 97.6°F | Resp 18

## 2022-10-04 DIAGNOSIS — E876 Hypokalemia: Secondary | ICD-10-CM | POA: Insufficient documentation

## 2022-10-04 DIAGNOSIS — C50919 Malignant neoplasm of unspecified site of unspecified female breast: Secondary | ICD-10-CM

## 2022-10-04 DIAGNOSIS — Z853 Personal history of malignant neoplasm of breast: Secondary | ICD-10-CM | POA: Diagnosis not present

## 2022-10-04 DIAGNOSIS — Z9012 Acquired absence of left breast and nipple: Secondary | ICD-10-CM | POA: Diagnosis not present

## 2022-10-04 DIAGNOSIS — Z923 Personal history of irradiation: Secondary | ICD-10-CM | POA: Diagnosis not present

## 2022-10-04 DIAGNOSIS — Z95828 Presence of other vascular implants and grafts: Secondary | ICD-10-CM

## 2022-10-04 DIAGNOSIS — Z79899 Other long term (current) drug therapy: Secondary | ICD-10-CM | POA: Insufficient documentation

## 2022-10-04 DIAGNOSIS — Z803 Family history of malignant neoplasm of breast: Secondary | ICD-10-CM | POA: Insufficient documentation

## 2022-10-04 DIAGNOSIS — Z800 Family history of malignant neoplasm of digestive organs: Secondary | ICD-10-CM | POA: Insufficient documentation

## 2022-10-04 DIAGNOSIS — Z9221 Personal history of antineoplastic chemotherapy: Secondary | ICD-10-CM | POA: Diagnosis not present

## 2022-10-04 LAB — CBC WITH DIFFERENTIAL/PLATELET
Abs Immature Granulocytes: 0.04 10*3/uL (ref 0.00–0.07)
Basophils Absolute: 0 10*3/uL (ref 0.0–0.1)
Basophils Relative: 1 %
Eosinophils Absolute: 0.1 10*3/uL (ref 0.0–0.5)
Eosinophils Relative: 2 %
HCT: 39.3 % (ref 36.0–46.0)
Hemoglobin: 13 g/dL (ref 12.0–15.0)
Immature Granulocytes: 1 %
Lymphocytes Relative: 31 %
Lymphs Abs: 1.8 10*3/uL (ref 0.7–4.0)
MCH: 27.9 pg (ref 26.0–34.0)
MCHC: 33.1 g/dL (ref 30.0–36.0)
MCV: 84.3 fL (ref 80.0–100.0)
Monocytes Absolute: 0.5 10*3/uL (ref 0.1–1.0)
Monocytes Relative: 9 %
Neutro Abs: 3.3 10*3/uL (ref 1.7–7.7)
Neutrophils Relative %: 56 %
Platelets: 273 10*3/uL (ref 150–400)
RBC: 4.66 MIL/uL (ref 3.87–5.11)
RDW: 13.7 % (ref 11.5–15.5)
WBC: 5.7 10*3/uL (ref 4.0–10.5)
nRBC: 0 % (ref 0.0–0.2)

## 2022-10-04 LAB — VITAMIN D 25 HYDROXY (VIT D DEFICIENCY, FRACTURES): Vit D, 25-Hydroxy: 31.34 ng/mL (ref 30–100)

## 2022-10-04 LAB — COMPREHENSIVE METABOLIC PANEL
ALT: 24 U/L (ref 0–44)
AST: 19 U/L (ref 15–41)
Albumin: 3.9 g/dL (ref 3.5–5.0)
Alkaline Phosphatase: 86 U/L (ref 38–126)
Anion gap: 9 (ref 5–15)
BUN: 14 mg/dL (ref 6–20)
CO2: 24 mmol/L (ref 22–32)
Calcium: 9 mg/dL (ref 8.9–10.3)
Chloride: 104 mmol/L (ref 98–111)
Creatinine, Ser: 0.87 mg/dL (ref 0.44–1.00)
GFR, Estimated: >60 mL/min (ref >60)
Glucose, Bld: 152 mg/dL — ABNORMAL HIGH (ref 70–99)
Potassium: 3.7 mmol/L (ref 3.5–5.1)
Sodium: 137 mmol/L (ref 135–145)
Total Bilirubin: 0.7 mg/dL (ref 0.3–1.2)
Total Protein: 7.2 g/dL (ref 6.5–8.1)

## 2022-10-04 MED ORDER — HEPARIN SOD (PORK) LOCK FLUSH 100 UNIT/ML IV SOLN
500.0000 [IU] | Freq: Once | INTRAVENOUS | Status: AC
  Administered 2022-10-04: 500 [IU] via INTRAVENOUS

## 2022-10-04 MED ORDER — SODIUM CHLORIDE 0.9% FLUSH
10.0000 mL | Freq: Once | INTRAVENOUS | Status: AC
  Administered 2022-10-04: 10 mL via INTRAVENOUS

## 2022-10-04 NOTE — Patient Instructions (Signed)
[facility]  Discharge Instructions: Thank you for choosing [facility] to provide your oncology and hematology care.  If you have a lab appointment with the Cancer Center, please come in thru the Main Entrance and check in at the main information desk.  Wear comfortable clothing and clothing appropriate for easy access to any Portacath or PICC line.   We strive to give you quality time with your provider. You may need to reschedule your appointment if you arrive late (15 or more minutes).  Arriving late affects you and other patients whose appointments are after yours.  Also, if you miss three or more appointments without notifying the office, you may be dismissed from the clinic at the provider's discretion.      For prescription refill requests, have your pharmacy contact our office and allow 72 hours for refills to be completed.    Today you received the following port flush with lab draw, return as scheduled.   To help prevent nausea and vomiting after your treatment, we encourage you to take your nausea medication as directed.  BELOW ARE SYMPTOMS THAT SHOULD BE REPORTED IMMEDIATELY: *FEVER GREATER THAN 100.4 F (38 C) OR HIGHER *CHILLS OR SWEATING *NAUSEA AND VOMITING THAT IS NOT CONTROLLED WITH YOUR NAUSEA MEDICATION *UNUSUAL SHORTNESS OF BREATH *UNUSUAL BRUISING OR BLEEDING *URINARY PROBLEMS (pain or burning when urinating, or frequent urination) *BOWEL PROBLEMS (unusual diarrhea, constipation, pain near the anus) TENDERNESS IN MOUTH AND THROAT WITH OR WITHOUT PRESENCE OF ULCERS (sore throat, sores in mouth, or a toothache) UNUSUAL RASH, SWELLING OR PAIN  UNUSUAL VAGINAL DISCHARGE OR ITCHING   Items with * indicate a potential emergency and should be followed up as soon as possible or go to the Emergency Department if any problems should occur.  Please show the CHEMOTHERAPY ALERT CARD or IMMUNOTHERAPY ALERT CARD at check-in to the Emergency  Department and triage nurse.  Should you have questions after your visit or need to cancel or reschedule your appointment, please contact [facility] [phone]  and follow the prompts.  Office hours are 8:00 a.m. to 4:30 p.m. Monday - Friday. Please note that voicemails left after 4:00 p.m. may not be returned until the following business day.  We are closed weekends and major holidays. You have access to a nurse at all times for urgent questions. Please call the main number to the clinic [phone] and follow the prompts.  For any non-urgent questions, you may also contact your provider using MyChart. We now offer e-Visits for anyone 18 and older to request care online for non-urgent symptoms. For details visit [email or web address].   Also download the MyChart app! Go to the app store, search "MyChart", open the app, select , and log in with your MyChart username and password.  Masks are optional in the cancer centers. If you would like for your care team to wear a mask while they are taking care of you, please let them know. You may have one support person who is at least 54 years old accompany you for your appointments.  

## 2022-10-04 NOTE — Progress Notes (Signed)
Patient arrived today for port flush and lab draw.  Port flushed with good blood return noted. No bruising or swelling at site. Bandaid applied and patient discharged in satisfactory condition. VVS stable with no signs or symptoms of distressed noted.

## 2022-10-05 LAB — CANCER ANTIGEN 27.29: CA 27.29: 17.9 U/mL (ref 0.0–38.6)

## 2022-10-05 LAB — CANCER ANTIGEN 15-3: CA 15-3: 15.6 U/mL (ref 0.0–25.0)

## 2022-10-10 ENCOUNTER — Inpatient Hospital Stay (HOSPITAL_BASED_OUTPATIENT_CLINIC_OR_DEPARTMENT_OTHER): Payer: BC Managed Care – PPO | Admitting: Hematology

## 2022-10-10 VITALS — BP 99/65 | HR 97 | Temp 98.1°F | Resp 17 | Wt 160.1 lb

## 2022-10-10 DIAGNOSIS — Z853 Personal history of malignant neoplasm of breast: Secondary | ICD-10-CM | POA: Diagnosis not present

## 2022-10-10 DIAGNOSIS — C50919 Malignant neoplasm of unspecified site of unspecified female breast: Secondary | ICD-10-CM

## 2022-10-10 NOTE — Progress Notes (Signed)
[facility] [address] [place] [address]   Patient Care Team: Kathyrn Drown, MD as PCP - General (Family Medicine) Brien Mates, RN as Oncology Nurse Navigator (Oncology) Donetta Potts, RN as Oncology Nurse Navigator (Oncology)  SUMMARY OF ONCOLOGIC HISTORY: Oncology History Overview Note  Cancer Staging Triple negative malignant neoplasm of breast [facility]) Staging form: Breast, AJCC 8th Edition - Clinical stage from 06/20/2020: Stage IIIB (cT2, cN1, cM0, G2, ER-, PR-, HER2-) - Signed by Truitt Merle, MD on 06/05/2021 Stage prefix: Initial diagnosis Histologic grading system: 3 grade system - Pathologic stage from 02/09/2021: No Stage Recommended (ypT1a, pN0, cM0, G2, ER-, PR-, HER2-) - Signed by Truitt Merle, MD on 06/05/2021 Stage prefix: Post-therapy Response to neoadjuvant therapy: Partial response Histologic grading system: 3 grade system    Triple negative malignant neoplasm of breast ([place])  06/20/2020 Cancer Staging   Staging form: Breast, AJCC 8th Edition - Clinical stage from 06/20/2020: Stage IIIB (cT2, cN1, cM0, G2, ER-, PR-, HER2-) - Signed by Truitt Merle, MD on 06/05/2021 Stage prefix: Initial diagnosis Histologic grading system: 3 grade system   07/05/2020 Initial Diagnosis   Triple negative malignant neoplasm of breast ([place])   08/01/2020 - 12/20/2020 Chemotherapy      Patient is on Antibody Plan: HEAD/NECK PEMBROLIZUMAB Q21D    08/17/2020 Genetic Testing   Negative genetic testing:  No pathogenic variants detected on the [facility] CustomNext-Cancer + RNAinsight panel. A variant of uncertain significance (VUS) was detected in the MSH2 gene called c.1600C>T (p.R534C). The report date is 08/17/2020.  The CustomNext-Cancer+RNAinsight panel offered by [facility] includes sequencing and rearrangement analysis for up to 91 genes, which included the following 47 genes for Ms. Haze:  APC*, ATM*, AXIN2, BARD1, BMPR1A, BRCA1*, BRCA2*, BRIP1*, CDH1*, CDK4, CDKN2A,  CHEK2*, DICER1, EPCAM, GREM1, HOXB13, MEN1, MLH1*, MSH2*, MSH3, MSH6*, MUTYH*, NBN, NF1*, NF2, NTHL1, PALB2*, PMS2*, POLD1, POLE, PTEN*, RAD51C*, RAD51D*, RECQL, RET, SDHA, SDHAF2, SDHB, SDHC, SDHD, SMAD4, SMARCA4, STK11, TP53*, TSC1, TSC2, and VHL.  DNA and RNA analyses performed for * genes.     02/09/2021 Cancer Staging   Staging form: Breast, AJCC 8th Edition - Pathologic stage from 02/09/2021: No Stage Recommended (ypT1a, pN0, cM0, G2, ER-, PR-, HER2-) - Signed by Truitt Merle, MD on 06/05/2021 Stage prefix: Post-therapy Response to neoadjuvant therapy: Partial response Histologic grading system: 3 grade system   06/18/2021 -  Chemotherapy      Patient is on Antibody Plan: HEAD/NECK PEMBROLIZUMAB Q21D    Drug-induced neutropenia ([place])    CHIEF COMPLIANT: Follow-up for left breast cancer   INTERVAL HISTORY: Ms. Elizabeth Graves is a 56 y.o. female seen for follow-up of triple negative left breast cancer.  She is continuing to work full-time.  Denies any new onset pains.  Energy levels are 100%.  No recent infections or hospitalizations.  REVIEW OF SYSTEMS:   Review of Systems  Constitutional:  Negative for appetite change and fatigue.  All other systems reviewed and are negative.   I have reviewed the past medical history, past surgical history, social history and family history with the patient and they are unchanged from previous note.   ALLERGIES:   has No Known Allergies.   MEDICATIONS:  Current Outpatient Medications  Medication Sig Dispense Refill   atorvastatin (LIPITOR) 20 MG tablet Take 1 tablet (20 mg total) by mouth daily. 90 tablet 1   calcium carbonate (OSCAL) 1500 (600 Ca) MG TABS tablet Take 600 mg of elemental calcium  by mouth in the morning.     diphenhydrAMINE (BENADRYL) 25 mg capsule Take 25 mg by mouth as needed for allergies. Allergies.     esomeprazole (NEXIUM) 20 MG capsule Take 20 mg by mouth daily before breakfast.     ibuprofen (ADVIL,MOTRIN) 200 MG  tablet Take 400-600 mg by mouth as needed for moderate pain (pain.).     lisinopril (ZESTRIL) 5 MG tablet Take 1 tablet (5 mg total) by mouth daily. 90 tablet 1   loratadine (CLARITIN) 10 MG tablet Take 10 mg by mouth in the morning.     metFORMIN (GLUCOPHAGE) 500 MG tablet TAKE 1 TABLET BY MOUTH EVERY DAY 90 tablet 1   naproxen sodium (ALEVE) 220 MG tablet Take 440 mg by mouth as needed (pain.).     No current facility-administered medications for this visit.     PHYSICAL EXAMINATION: Performance status (ECOG): 0 - Asymptomatic  Vitals:   10/10/22 1536  BP: 99/65  Pulse: 97  Resp: 17  Temp: 98.1 F (36.7 C)  SpO2: 97%   Wt Readings from Last 3 Encounters:  10/10/22 160 lb 1.6 oz (72.6 kg)  09/23/22 160 lb 12.8 oz (72.9 kg)  04/04/22 162 lb 0.6 oz (73.5 kg)   Physical Exam Vitals reviewed.  Constitutional:      Appearance: Normal appearance.  Cardiovascular:     Rate and Rhythm: Normal rate and regular rhythm.     Pulses: Normal pulses.     Heart sounds: Normal heart sounds.  Pulmonary:     Effort: Pulmonary effort is normal.     Breath sounds: Normal breath sounds.  Chest:  Breasts:    Right: No swelling, bleeding, inverted nipple, mass, nipple discharge, skin change or tenderness.     Left: Absent. No swelling, mass, skin change (mastectomy site WNL) or tenderness.     Comments: R side Port-a-cath Lymphadenopathy:     Upper Body:     Right upper body: No supraclavicular or axillary adenopathy.     Left upper body: No supraclavicular or axillary adenopathy.  Neurological:     General: No focal deficit present.     Mental Status: She is alert and oriented to person, place, and time.  Psychiatric:        Mood and Affect: Mood normal.        Behavior: Behavior normal.     Breast Exam Chaperone: Thana Ates     LABORATORY DATA:  I have reviewed the data as listed    Latest Ref Rng & Units 10/04/2022    7:51 AM 03/25/2022    8:43 AM 12/03/2021    3:30 PM   CMP  Glucose 70 - 99 mg/dL 152  155  157   BUN 6 - 20 mg/dL $Remove'14  17  16   'NudOhSq$ Creatinine 0.44 - 1.00 mg/dL 0.87  0.89  0.88   Sodium 135 - 145 mmol/L 137  136  134   Potassium 3.5 - 5.1 mmol/L 3.7  3.9  3.4   Chloride 98 - 111 mmol/L 104  105  103   CO2 22 - 32 mmol/L $RemoveB'24  25  25   'xrfozrUc$ Calcium 8.9 - 10.3 mg/dL 9.0  9.0  9.1   Total Protein 6.5 - 8.1 g/dL 7.2  7.4  7.0   Total Bilirubin 0.3 - 1.2 mg/dL 0.7  0.4  0.3   Alkaline Phos 38 - 126 U/L 86  89  73   AST 15 - 41 U/L 19  18  21   ALT 0 - 44 U/L $Remo'24  27  30    'HWmJt$ Lab Results  Component Value Date   CAN153 15.6 10/04/2022   CAN153 18.3 03/25/2022   CAN153 17.6 07/05/2020   Lab Results  Component Value Date   WBC 5.7 10/04/2022   HGB 13.0 10/04/2022   HCT 39.3 10/04/2022   MCV 84.3 10/04/2022   PLT 273 10/04/2022   NEUTROABS 3.3 10/04/2022    ASSESSMENT:  1.  Triple negative left breast cancer (T2N1): -Patient identified left breast mass with left nipple retraction about 2 months ago. -Mammogram on 06/13/2020 showed mass in the retroareolar 12 o'clock position in the left breast.  2 suspicious left axillary lymph nodes, largest measuring 2 cm. -Breast ultrasound showed 2.7 x 1.6 x 2.2 cm mass in the 12:00 retroareolar position.  Just lateral to the mass is a calcified hypoechoic nodule that corresponds to densely calcified degenerating fibroadenoma. -Left breast 12:00 biopsy showed invasive ductal carcinoma, grade 2/3.  Left axillary lymph node biopsy was consistent with meta stasis. -ER negative, PR negative and Ki-67 15%.  HER-2 equivocal by IHC.  HER-2 FISH negative. -MRI of the breast on 07/19/2020 shows 4.2 cm enhancing mass in the subareolar left breast, enhancement extending to the level of the left nipple with associated nipple retraction and periareolar skin thickening.  6 morphologically abnormal level 1 left axillary lymph nodes.  No evidence of malignancy in the right breast. -CT CAP on 07/14/2020 shows dominant left axillary  nodes measuring 1.8 cm short axis.  Small left subpectoral nodes measuring up to 7 mm short axis, suspicious.  Small mediastinal lymph nodes measuring 7 to 9 mm, thought to be reactive.  No other evidence of metastatic disease. -Neoadjuvant Dose dense AC started on 08/01/2020. -11 cycles of carboplatin and Taxol she completed on 12/20/2020. -Left mastectomy and axillary lymph node biopsy on 02/09/2021-pathology with 0.2 cm grade 2 IDC, margins negative, 0/12 lymph nodes involved, [id number]. - 6 cycles of adjuvant capecitabine from 03/20/2021 through 07/04/2021. [employer] has denied request for pembrolizumab based on keynote study.   2.  Family history: -Father had gastroesophageal junction cancer.  Maternal grandmother had throat cancer and paternal aunt had breast cancer. -[facility] genetics testing showed MSH2 VUS.   3.  High risk drug monitoring: -Echo on 07/12/2020 shows EF 60 to 65%.   PLAN:  1.  T2N1 left breast TNBC: - Left mastectomy site is within normal limits. - Mammogram from 08/22/2022 of the right breast showed BI-RADS Category 1. - Labs from 10/04/2022 shows normal LFTs and CBC.  Tumor markers are within normal limits. - Recommend follow-up in 6 months with repeat labs and tumor markers.   2.  Hypokalemia: - Continue potassium daily.  Potassium is normal.   3.  Hypomagnesemia: - Continue magnesium daily.  We will check it next time.   4.  Bone health: - Continue calcium and vitamin D supplements.  Vitamin D level is 31.  Breast Cancer therapy associated bone loss: I have recommended calcium, Vitamin D and weight bearing exercises.  Orders placed this encounter:  Orders Placed This Encounter  Procedures   CBC with Differential/Platelet   Comprehensive metabolic panel   Magnesium   VITAMIN D 25 Hydroxy (Vit-D Deficiency, Fractures)   Cancer antigen 15-3   Cancer antigen 27.29     The patient has a good understanding of the overall plan. She agrees with it. She  will call with any problems that may develop before  the next visit here.  Derek Jack, MD [place] [phone]

## 2022-10-10 NOTE — Patient Instructions (Addendum)
[place]  Discharge Instructions  You were seen and examined today by Dr. Delton Coombes.  Dr. Delton Coombes discussed your most recent lab work and mammogram which revealed that everything looks good.  Follow-up as scheduled in 6 months with labs.   Thank you for choosing [facility] to provide your oncology and hematology care.   To afford each patient quality time with our provider, please arrive at least 15 minutes before your scheduled appointment time. You may need to reschedule your appointment if you arrive late (10 or more minutes). Arriving late affects you and other patients whose appointments are after yours.  Also, if you miss three or more appointments without notifying the office, you may be dismissed from the clinic at the provider's discretion.    Again, thank you for choosing [facility].  Our hope is that these requests will decrease the amount of time that you wait before being seen by our physicians.   If you have a lab appointment with the [facility] please come in thru the Main Entrance and check in at the main information desk.           _____________________________________________________________  Should you have questions after your visit to [facility], please contact our office at [phone] and follow the prompts.  Our office hours are 8:00 a.m. to 4:30 p.m. Monday - Thursday and 8:00 a.m. to 2:30 p.m. Friday.  Please note that voicemails left after 4:00 p.m. may not be returned until the following business day.  We are closed weekends and all major holidays.  You do have access to a nurse 24-7, just call the main number to the clinic [phone] and do not press any options, hold on the line and a nurse will answer the phone.    For prescription refill requests, have your pharmacy contact our office and allow 72 hours.    Masks are optional in the cancer centers. If you would  like for your care team to wear a mask while they are taking care of you, please let them know. You may have one support person who is at least 54 years old accompany you for your appointments.

## 2022-10-11 ENCOUNTER — Other Ambulatory Visit: Payer: Self-pay

## 2023-01-03 ENCOUNTER — Inpatient Hospital Stay: Payer: BC Managed Care – PPO | Attending: Hematology

## 2023-01-03 VITALS — BP 130/83 | HR 88 | Temp 97.2°F | Resp 18 | Wt 160.0 lb

## 2023-01-03 DIAGNOSIS — Z452 Encounter for adjustment and management of vascular access device: Secondary | ICD-10-CM | POA: Diagnosis present

## 2023-01-03 DIAGNOSIS — E119 Type 2 diabetes mellitus without complications: Secondary | ICD-10-CM

## 2023-01-03 DIAGNOSIS — Z853 Personal history of malignant neoplasm of breast: Secondary | ICD-10-CM | POA: Diagnosis present

## 2023-01-03 LAB — HEMOGLOBIN A1C
Hgb A1c MFr Bld: 7.2 % — ABNORMAL HIGH (ref 4.8–5.6)
Mean Plasma Glucose: 160 mg/dL

## 2023-01-03 MED ORDER — SODIUM CHLORIDE 0.9% FLUSH
10.0000 mL | INTRAVENOUS | Status: AC
  Administered 2023-01-03: 10 mL

## 2023-01-03 MED ORDER — HEPARIN SOD (PORK) LOCK FLUSH 100 UNIT/ML IV SOLN
500.0000 [IU] | Freq: Once | INTRAVENOUS | Status: AC
  Administered 2023-01-03: 500 [IU] via INTRAVENOUS

## 2023-01-03 NOTE — Patient Instructions (Signed)
[facility]  Discharge Instructions: Thank you for choosing [facility] to provide your oncology and hematology care.  If you have a lab appointment with the [facility], please come in thru the Main Entrance and check in at the main information desk.  Wear comfortable clothing and clothing appropriate for easy access to any Portacath or PICC line.   We strive to give you quality time with your provider. You may need to reschedule your appointment if you arrive late (15 or more minutes).  Arriving late affects you and other patients whose appointments are after yours.  Also, if you miss three or more appointments without notifying the office, you may be dismissed from the clinic at the provider's discretion.      For prescription refill requests, have your pharmacy contact our office and allow 72 hours for refills to be completed.    Today you received the following chemotherapy and/or immunotherapy agents port flush and lab work.       To help prevent nausea and vomiting after your treatment, we encourage you to take your nausea medication as directed.  BELOW ARE SYMPTOMS THAT SHOULD BE REPORTED IMMEDIATELY: *FEVER GREATER THAN 100.4 F (38 C) OR HIGHER *CHILLS OR SWEATING *NAUSEA AND VOMITING THAT IS NOT CONTROLLED WITH YOUR NAUSEA MEDICATION *UNUSUAL SHORTNESS OF BREATH *UNUSUAL BRUISING OR BLEEDING *URINARY PROBLEMS (pain or burning when urinating, or frequent urination) *BOWEL PROBLEMS (unusual diarrhea, constipation, pain near the anus) TENDERNESS IN MOUTH AND THROAT WITH OR WITHOUT PRESENCE OF ULCERS (sore throat, sores in mouth, or a toothache) UNUSUAL RASH, SWELLING OR PAIN  UNUSUAL VAGINAL DISCHARGE OR ITCHING   Items with * indicate a potential emergency and should be followed up as soon as possible or go to the Emergency Department if any problems should occur.  Please show the CHEMOTHERAPY ALERT CARD or IMMUNOTHERAPY ALERT CARD at  check-in to the Emergency Department and triage nurse.  Should you have questions after your visit or need to cancel or reschedule your appointment, please contact [facility] [phone]  and follow the prompts.  Office hours are 8:00 a.m. to 4:30 p.m. Monday - Friday. Please note that voicemails left after 4:00 p.m. may not be returned until the following business day.  We are closed weekends and major holidays. You have access to a nurse at all times for urgent questions. Please call the main number to the clinic [phone] and follow the prompts.  For any non-urgent questions, you may also contact your provider using MyChart. We now offer e-Visits for anyone 45 and older to request care online for non-urgent symptoms. For details visit [email or web address].   Also download the MyChart app! Go to the app store, search "MyChart", open the app, select [facility], and log in with your MyChart username and password.

## 2023-01-03 NOTE — Progress Notes (Signed)
Elizabeth Graves presented for Portacath access and flush.  Portacath located right chest wall accessed with  H 20 needle.  Good blood return present. Portacath flushed with 44m NS and 500U/51mHeparin and needle removed intact.  Procedure tolerated well and without incident. Treatment given today per MD orders. No complaints at this time. Discharged from clinic ambulatory in stable condition. Alert and oriented x 3. [facility].

## 2023-01-04 ENCOUNTER — Encounter: Payer: Self-pay | Admitting: Family Medicine

## 2023-01-06 ENCOUNTER — Other Ambulatory Visit: Payer: Self-pay

## 2023-01-06 ENCOUNTER — Other Ambulatory Visit: Payer: Self-pay | Admitting: Family Medicine

## 2023-01-06 DIAGNOSIS — E119 Type 2 diabetes mellitus without complications: Secondary | ICD-10-CM

## 2023-01-06 DIAGNOSIS — E1169 Type 2 diabetes mellitus with other specified complication: Secondary | ICD-10-CM

## 2023-01-06 DIAGNOSIS — I10 Essential (primary) hypertension: Secondary | ICD-10-CM

## 2023-01-06 MED ORDER — METFORMIN HCL ER 500 MG PO TB24: ORAL_TABLET | ORAL | 5 refills | Status: DC

## 2023-01-06 NOTE — Telephone Encounter (Signed)
Nurses An option would be metformin extended release 500 mg  She could take both of these at the same time in the evening with supper and typically the extended release are easier to tolerate much more than the standard metformin  Please see result note from her A1c in early January  Please touch base with her to see if that is something she is open to.  Then she could do a follow-up visit somewhere within the next 4 to 6 weeks sooner if she feels she is having ongoing troubles thanks

## 2023-01-06 NOTE — Telephone Encounter (Signed)
Prescription for the extended release was sent in Follow-up 4 to 6 weeks as a follow-up visit

## 2023-03-24 ENCOUNTER — Ambulatory Visit: Payer: BC Managed Care – PPO | Admitting: Family Medicine

## 2023-04-01 ENCOUNTER — Ambulatory Visit: Payer: BC Managed Care – PPO | Admitting: Family Medicine

## 2023-04-01 VITALS — BP 122/80 | HR 84 | Temp 97.3°F | Ht 61.06 in | Wt 158.0 lb

## 2023-04-01 DIAGNOSIS — E1169 Type 2 diabetes mellitus with other specified complication: Secondary | ICD-10-CM

## 2023-04-01 DIAGNOSIS — F411 Generalized anxiety disorder: Secondary | ICD-10-CM | POA: Diagnosis not present

## 2023-04-01 DIAGNOSIS — I10 Essential (primary) hypertension: Secondary | ICD-10-CM

## 2023-04-01 DIAGNOSIS — F320 Major depressive disorder, single episode, mild: Secondary | ICD-10-CM

## 2023-04-01 DIAGNOSIS — E119 Type 2 diabetes mellitus without complications: Secondary | ICD-10-CM

## 2023-04-01 DIAGNOSIS — E785 Hyperlipidemia, unspecified: Secondary | ICD-10-CM

## 2023-04-01 DIAGNOSIS — M79602 Pain in left arm: Secondary | ICD-10-CM

## 2023-04-01 MED ORDER — ATORVASTATIN CALCIUM 20 MG PO TABS: 20.0000 mg | ORAL_TABLET | Freq: Every day | ORAL | 1 refills | Status: DC

## 2023-04-01 MED ORDER — METFORMIN HCL ER 500 MG PO TB24: ORAL_TABLET | ORAL | 5 refills | Status: DC

## 2023-04-01 MED ORDER — LISINOPRIL 5 MG PO TABS: 5.0000 mg | ORAL_TABLET | Freq: Every day | ORAL | 1 refills | Status: DC

## 2023-04-01 MED ORDER — ALBUTEROL SULFATE HFA 108 (90 BASE) MCG/ACT IN AERS: 2.0000 | INHALATION_SPRAY | RESPIRATORY_TRACT | 1 refills | Status: AC | PRN

## 2023-04-01 MED ORDER — SERTRALINE HCL 50 MG PO TABS: 50.0000 mg | ORAL_TABLET | Freq: Every day | ORAL | 1 refills | Status: DC

## 2023-04-01 NOTE — Progress Notes (Signed)
Subjective:    Patient ID: Elizabeth Graves, female    DOB: 01/11/1966, 57 y.o.   MRN: [id number]  6 month follow up HTN , DM Diabetes She presents for her follow-up diabetic visit. She has type 2 diabetes mellitus. Current diabetic treatments: metformin xr.  HTN (hypertension), benign  Type 2 diabetes mellitus without complication, without long-term current use of insulin - Plan: Hemoglobin A1c  Hyperlipidemia associated with type 2 diabetes mellitus - Plan: Lipid Panel  Left arm pain  GAD (generalized anxiety disorder)  Depression, major, single episode, mild Patient signs finds himself stress finds a still feeling down denies being suicidal.  Energy level subpar.  Dealing with stressors plus also her sister lost her daughter.  Patient states that 1 time she was on sertraline and did well she was interested in restarting this. arms  Patient here for follow-up regarding cholesterol.    Patient relates taking medication on a regular basis Denies problems with medication Importance of dietary measures discussed Regular lab work regarding lipid and liver was checked and if needing additional labs was appropriately ordered  The patient was seen today as part of a comprehensive diabetic check up. Patient has diabetes Patient relates good compliance with taking the medication. We discussed their diet and exercise activities  We also discussed the importance of notifying us if any excessively high glucoses or low sugars.    Review of Systems     Objective:   Physical Exam General-in no acute distress Eyes-no discharge Lungs-respiratory rate normal, CTA CV-no murmurs,RRR Extremities skin warm dry no edema Neuro grossly normal Behavior normal, alert        Assessment & Plan:  1. HTN (hypertension), benign HTN- patient seen for follow-up regarding HTN.   Diet, medication compliance, appropriate labs and refills were completed.   Importance of keeping blood pressure  under good control to lessen the risk of complications discussed Regular follow-up visits discussed   2. Type 2 diabetes mellitus without complication, without long-term current use of insulin The patient was seen today as part of a comprehensive visit for diabetes. The importance of keeping her A1c at or below 7 range was discussed.  Discussed diet, activity, and medication compliance Emphasized healthy eating primarily with vegetables fruits and if utilizing meats lean meats such as chicken or fish grilled baked broiled Avoid sugary drinks Minimize and avoid processed foods Fit in regular physical activity preferably 25 to 30 minutes 4 times per week Standard follow-up visit recommended.  Patient aware lack of control and follow-up increases risk of diabetic complications. Regular follow-up visits Yearly ophthalmology Yearly foot exam  - Hemoglobin A1c  3. Hyperlipidemia associated with type 2 diabetes mellitus Hyperlipidemia-importance of diet, weight control, activity, compliance with medications discussed.   Recent labs reviewed.   Any additional labs or refills ordered.   Importance of keeping under good control discussed. Regular follow-up visits discussed  - Lipid Panel  4. Left arm pain Left arm pain discomfort hurts with lifting and packing: Any objects that are significant weight at her job she is having to lift anywhere between 5 and 40 pounds.  Since she had her breast surgery for breast cancer along with lymph node dissection her left arm is giving her trouble and she is not able to do any significant lifting  She states she has an accommodation form that will need to be filled out she will forward it to [place] along with some details regarding additional issues  5. GAD (generalized anxiety  disorder) Under a lot of stress with some level of sadness reasonable to restart Zoloft 50 mg daily patient agrees she will send [place] an update within 3 to 4 weeks on how things are going  and will notify us if getting worse This will help with major depression mild Warning signs were discussed if worse follow-up ASAP Follow-up in 6 months

## 2023-04-03 ENCOUNTER — Inpatient Hospital Stay: Payer: BC Managed Care – PPO | Attending: Hematology

## 2023-04-03 VITALS — BP 133/89 | HR 97 | Temp 97.6°F | Resp 18

## 2023-04-03 DIAGNOSIS — Z79899 Other long term (current) drug therapy: Secondary | ICD-10-CM | POA: Insufficient documentation

## 2023-04-03 DIAGNOSIS — Z8541 Personal history of malignant neoplasm of cervix uteri: Secondary | ICD-10-CM | POA: Insufficient documentation

## 2023-04-03 DIAGNOSIS — E119 Type 2 diabetes mellitus without complications: Secondary | ICD-10-CM

## 2023-04-03 DIAGNOSIS — E876 Hypokalemia: Secondary | ICD-10-CM | POA: Diagnosis not present

## 2023-04-03 DIAGNOSIS — Z87891 Personal history of nicotine dependence: Secondary | ICD-10-CM | POA: Insufficient documentation

## 2023-04-03 DIAGNOSIS — Z90721 Acquired absence of ovaries, unilateral: Secondary | ICD-10-CM | POA: Diagnosis not present

## 2023-04-03 DIAGNOSIS — Z853 Personal history of malignant neoplasm of breast: Secondary | ICD-10-CM | POA: Diagnosis present

## 2023-04-03 DIAGNOSIS — Z8379 Family history of other diseases of the digestive system: Secondary | ICD-10-CM | POA: Diagnosis not present

## 2023-04-03 DIAGNOSIS — Z9012 Acquired absence of left breast and nipple: Secondary | ICD-10-CM | POA: Insufficient documentation

## 2023-04-03 DIAGNOSIS — Z8249 Family history of ischemic heart disease and other diseases of the circulatory system: Secondary | ICD-10-CM | POA: Insufficient documentation

## 2023-04-03 DIAGNOSIS — Z808 Family history of malignant neoplasm of other organs or systems: Secondary | ICD-10-CM | POA: Diagnosis not present

## 2023-04-03 DIAGNOSIS — I10 Essential (primary) hypertension: Secondary | ICD-10-CM | POA: Insufficient documentation

## 2023-04-03 DIAGNOSIS — Z803 Family history of malignant neoplasm of breast: Secondary | ICD-10-CM | POA: Insufficient documentation

## 2023-04-03 DIAGNOSIS — Z90710 Acquired absence of both cervix and uterus: Secondary | ICD-10-CM | POA: Diagnosis not present

## 2023-04-03 DIAGNOSIS — C50919 Malignant neoplasm of unspecified site of unspecified female breast: Secondary | ICD-10-CM

## 2023-04-03 DIAGNOSIS — Z801 Family history of malignant neoplasm of trachea, bronchus and lung: Secondary | ICD-10-CM | POA: Insufficient documentation

## 2023-04-03 DIAGNOSIS — Z95828 Presence of other vascular implants and grafts: Secondary | ICD-10-CM

## 2023-04-03 DIAGNOSIS — Z833 Family history of diabetes mellitus: Secondary | ICD-10-CM | POA: Diagnosis not present

## 2023-04-03 DIAGNOSIS — E785 Hyperlipidemia, unspecified: Secondary | ICD-10-CM

## 2023-04-03 LAB — CBC WITH DIFFERENTIAL/PLATELET
Abs Immature Granulocytes: 0.02 10*3/uL (ref 0.00–0.07)
Basophils Absolute: 0.1 10*3/uL (ref 0.0–0.1)
Basophils Relative: 1 %
Eosinophils Absolute: 0.3 10*3/uL (ref 0.0–0.5)
Eosinophils Relative: 4 %
HCT: 37.6 % (ref 36.0–46.0)
Hemoglobin: 12.9 g/dL (ref 12.0–15.0)
Immature Granulocytes: 0 %
Lymphocytes Relative: 31 %
Lymphs Abs: 2.3 10*3/uL (ref 0.7–4.0)
MCH: 28.5 pg (ref 26.0–34.0)
MCHC: 34.3 g/dL (ref 30.0–36.0)
MCV: 83.2 fL (ref 80.0–100.0)
Monocytes Absolute: 0.6 10*3/uL (ref 0.1–1.0)
Monocytes Relative: 8 %
Neutro Abs: 4.2 10*3/uL (ref 1.7–7.7)
Neutrophils Relative %: 56 %
Platelets: 285 10*3/uL (ref 150–400)
RBC: 4.52 MIL/uL (ref 3.87–5.11)
RDW: 13.7 % (ref 11.5–15.5)
WBC: 7.4 10*3/uL (ref 4.0–10.5)
nRBC: 0 % (ref 0.0–0.2)

## 2023-04-03 LAB — COMPREHENSIVE METABOLIC PANEL
ALT: 27 U/L (ref 0–44)
AST: 21 U/L (ref 15–41)
Albumin: 4.1 g/dL (ref 3.5–5.0)
Alkaline Phosphatase: 73 U/L (ref 38–126)
Anion gap: 9 (ref 5–15)
BUN: 13 mg/dL (ref 6–20)
CO2: 23 mmol/L (ref 22–32)
Calcium: 8.8 mg/dL — ABNORMAL LOW (ref 8.9–10.3)
Chloride: 102 mmol/L (ref 98–111)
Creatinine, Ser: 0.87 mg/dL (ref 0.44–1.00)
GFR, Estimated: >60 mL/min (ref >60)
Glucose, Bld: 102 mg/dL — ABNORMAL HIGH (ref 70–99)
Potassium: 3.3 mmol/L — ABNORMAL LOW (ref 3.5–5.1)
Sodium: 134 mmol/L — ABNORMAL LOW (ref 135–145)
Total Bilirubin: 0.5 mg/dL (ref 0.3–1.2)
Total Protein: 7.2 g/dL (ref 6.5–8.1)

## 2023-04-03 LAB — LIPID PANEL
Cholesterol: 123 mg/dL (ref 0–200)
HDL: 28 mg/dL — ABNORMAL LOW (ref >40)
LDL Cholesterol: 55 mg/dL (ref 0–99)
Total CHOL/HDL Ratio: 4.4 RATIO
Triglycerides: 201 mg/dL — ABNORMAL HIGH (ref <150)
VLDL: 40 mg/dL (ref 0–40)

## 2023-04-03 LAB — MAGNESIUM: Magnesium: 1.5 mg/dL — ABNORMAL LOW (ref 1.7–2.4)

## 2023-04-03 MED ORDER — SODIUM CHLORIDE 0.9% FLUSH
10.0000 mL | Freq: Once | INTRAVENOUS | Status: AC
  Administered 2023-04-03: 10 mL via INTRAVENOUS

## 2023-04-03 MED ORDER — HEPARIN SOD (PORK) LOCK FLUSH 100 UNIT/ML IV SOLN
500.0000 [IU] | Freq: Once | INTRAVENOUS | Status: AC
  Administered 2023-04-03: 500 [IU] via INTRAVENOUS

## 2023-04-03 NOTE — Patient Instructions (Signed)
[facility]  Discharge Instructions: Thank you for choosing [facility] to provide your oncology and hematology care.  If you have a lab appointment with the [facility] - please note that after April 8th, 2024, all labs will be drawn in the cancer center.  You do not have to check in or register with the main entrance as you have in the past but will complete your check-in in the cancer center.  Wear comfortable clothing and clothing appropriate for easy access to any Portacath or PICC line.   We strive to give you quality time with your provider. You may need to reschedule your appointment if you arrive late (15 or more minutes).  Arriving late affects you and other patients whose appointments are after yours.  Also, if you miss three or more appointments without notifying the office, you may be dismissed from the clinic at the provider's discretion.      For prescription refill requests, have your pharmacy contact our office and allow 72 hours for refills to be completed.    Today you received the following Port flush with lab, return as scheduled.   To help prevent nausea and vomiting after your treatment, we encourage you to take your nausea medication as directed.  BELOW ARE SYMPTOMS THAT SHOULD BE REPORTED IMMEDIATELY: *FEVER GREATER THAN 100.4 F (38 C) OR HIGHER *CHILLS OR SWEATING *NAUSEA AND VOMITING THAT IS NOT CONTROLLED WITH YOUR NAUSEA MEDICATION *UNUSUAL SHORTNESS OF BREATH *UNUSUAL BRUISING OR BLEEDING *URINARY PROBLEMS (pain or burning when urinating, or frequent urination) *BOWEL PROBLEMS (unusual diarrhea, constipation, pain near the anus) TENDERNESS IN MOUTH AND THROAT WITH OR WITHOUT PRESENCE OF ULCERS (sore throat, sores in mouth, or a toothache) UNUSUAL RASH, SWELLING OR PAIN  UNUSUAL VAGINAL DISCHARGE OR ITCHING   Items with * indicate a potential emergency and should be followed up as soon as possible or go to the Emergency  Department if any problems should occur.  Please show the CHEMOTHERAPY ALERT CARD or IMMUNOTHERAPY ALERT CARD at check-in to the Emergency Department and triage nurse.  Should you have questions after your visit or need to cancel or reschedule your appointment, please contact [facility] [phone]  and follow the prompts.  Office hours are 8:00 a.m. to 4:30 p.m. Monday - Friday. Please note that voicemails left after 4:00 p.m. may not be returned until the following business day.  We are closed weekends and major holidays. You have access to a nurse at all times for urgent questions. Please call the main number to the clinic [phone] and follow the prompts.  For any non-urgent questions, you may also contact your provider using MyChart. We now offer e-Visits for anyone 30 and older to request care online for non-urgent symptoms. For details visit [email or web address].   Also download the MyChart app! Go to the app store, search "MyChart", open the app, select [facility], and log in with your MyChart username and password.

## 2023-04-03 NOTE — Progress Notes (Signed)
Port flushed with good blood return noted. No bruising or swelling at site. Bandaid applied and patient discharged in satisfactory condition. VVS stable with no signs or symptoms of distressed noted. 

## 2023-04-04 LAB — CANCER ANTIGEN 27.29: CA 27.29: 10.8 U/mL (ref 0.0–38.6)

## 2023-04-04 LAB — HEMOGLOBIN A1C
Hgb A1c MFr Bld: 6.9 % — ABNORMAL HIGH (ref 4.8–5.6)
Mean Plasma Glucose: 151 mg/dL

## 2023-04-04 LAB — VITAMIN D 25 HYDROXY (VIT D DEFICIENCY, FRACTURES): Vit D, 25-Hydroxy: 51.04 ng/mL (ref 30–100)

## 2023-04-05 LAB — CANCER ANTIGEN 15-3: CA 15-3: 13.7 U/mL (ref 0.0–25.0)

## 2023-04-09 NOTE — Progress Notes (Signed)
[facility] [address] [place] [address]    Clinic Day:  04/10/2023  Referring physician: Babs Sciara, MD  Patient Care Team: Babs Sciara, MD as PCP - General (Family Medicine) Therese Sarah, RN as Oncology Nurse Navigator (Oncology) Mickie Bail, RN as Oncology Nurse Navigator (Oncology)   ASSESSMENT & PLAN:   Assessment: 1.  Triple negative left breast cancer (T2N1): -Patient identified left breast mass with left nipple retraction about 2 months ago. -Mammogram on 06/13/2020 showed mass in the retroareolar 12 o'clock position in the left breast.  2 suspicious left axillary lymph nodes, largest measuring 2 cm. -Breast ultrasound showed 2.7 x 1.6 x 2.2 cm mass in the 12:00 retroareolar position.  Just lateral to the mass is a calcified hypoechoic nodule that corresponds to densely calcified degenerating fibroadenoma. -Left breast 12:00 biopsy showed invasive ductal carcinoma, grade 2/3.  Left axillary lymph node biopsy was consistent with meta stasis. -ER negative, PR negative and Ki-67 15%.  HER-2 equivocal by IHC.  HER-2 FISH negative. -MRI of the breast on 07/19/2020 shows 4.2 cm enhancing mass in the subareolar left breast, enhancement extending to the level of the left nipple with associated nipple retraction and periareolar skin thickening.  6 morphologically abnormal level 1 left axillary lymph nodes.  No evidence of malignancy in the right breast. -CT CAP on 07/14/2020 shows dominant left axillary nodes measuring 1.8 cm short axis.  Small left subpectoral nodes measuring up to 7 mm short axis, suspicious.  Small mediastinal lymph nodes measuring 7 to 9 mm, thought to be reactive.  No other evidence of metastatic disease. -Neoadjuvant Dose dense AC started on 08/01/2020. -11 cycles of carboplatin and Taxol she completed on 12/20/2020. -Left mastectomy and axillary lymph node biopsy on 02/09/2021-pathology with 0.2 cm grade 2 IDC, margins negative,  0/12 lymph nodes involved, [id number]. - 6 cycles of adjuvant capecitabine from 03/20/2021 through 07/04/2021. [employer] has denied request for pembrolizumab based on keynote study.   2.  Family history: -Father had gastroesophageal junction cancer.  Maternal grandmother had throat cancer and paternal aunt had breast cancer. -[facility] genetics testing showed MSH2 VUS.   3.  High risk drug monitoring: -Echo on 07/12/2020 shows EF 60 to 65%.   Plan: 1.  T2N1 left breast TNBC: - Left mastectomy site is within normal limits.  Right breast has no palpable masses.  No palpable adenopathy. - Mammogram from 08/22/2022 of the right breast: BI-RADS Category 1. - Labs from 04/03/2023: Normal LFTs.  CBC normal.  Tumor marker CA 15-3 and CA 27-29 were normal. - RTC 6 months for follow-up with repeat labs.  Will also schedule for right breast mammogram in August. - She wants to have her port discontinued.  At next visit if all her labs and exam is normal, we will discontinue her port.   2.  Hypokalemia: - She takes OTC potassium daily.  Potassium today 3.3.  She was told to take 2 of them daily.   3.  Hypomagnesemia: - She stopped taking magnesium.  Magnesium is low at 1.5 today.  I have recommended that she start taking magnesium OTC tablet daily.   4.  Bone health: - Vitamin D level is 51.  Continue calcium and D supplements.  Orders Placed This Encounter  Procedures   MM 3D SCREENING MAMMOGRAM UNILATERAL RIGHT BREAST    NAS No implants NMD-[facility]    Standing Status:   Future    Standing Expiration  Date:   04/09/2024    Order Specific Question:   Reason for Exam (SYMPTOM  OR DIAGNOSIS REQUIRED)    Answer:   screening for breast cancer    Order Specific Question:   Is the patient pregnant?    Answer:   No    Order Specific Question:   Preferred imaging location?    Answer:   [facility]    Order Specific Question:   Release to patient    Answer:   Immediate   CBC with  Differential/Platelet    Standing Status:   Future    Standing Expiration Date:   04/09/2024    Order Specific Question:   Release to patient    Answer:   Immediate   Comprehensive metabolic panel    Standing Status:   Future    Standing Expiration Date:   04/09/2024    Order Specific Question:   Release to patient    Answer:   Immediate   Magnesium    Standing Status:   Future    Standing Expiration Date:   04/09/2024    Order Specific Question:   Release to patient    Answer:   Immediate   Cancer antigen 15-3    Standing Status:   Future    Standing Expiration Date:   04/09/2024   Cancer antigen 27.29    Standing Status:   Future    Standing Expiration Date:   04/09/2024      I,[name],acting as a scribe for Doreatha Massed, MD.,have documented all relevant documentation on the behalf of Doreatha Massed, MD,as directed by  Doreatha Massed, MD while in the presence of Doreatha Massed, MD.   I, Doreatha Massed MD, have reviewed the above documentation for accuracy and completeness, and I agree with the above.   Doreatha Massed, MD   4/11/20244:26 PM  CHIEF COMPLAINT:   Diagnosis: left breast cancer    Cancer Staging  Triple negative malignant neoplasm of breast Staging form: Breast, AJCC 8th Edition - Clinical stage from 06/20/2020: Stage IIIB (cT2, cN1, cM0, G2, ER-, PR-, HER2-) - Signed by Malachy Mood, MD on 06/05/2021 - Pathologic stage from 02/09/2021: No Stage Recommended (ypT1a, pN0, cM0, G2, ER-, PR-, HER2-) - Signed by Malachy Mood, MD on 06/05/2021    Prior Therapy: Neoadjuvant chemotherapy, left mastectomy  Current Therapy:  surveillance   HISTORY OF PRESENT ILLNESS:   Oncology History Overview Note  Cancer Staging Triple negative malignant neoplasm of breast (HCC) Staging form: Breast, AJCC 8th Edition - Clinical stage from 06/20/2020: Stage IIIB (cT2, cN1, cM0, G2, ER-, PR-, HER2-) - Signed by Malachy Mood, MD on 06/05/2021 Stage  prefix: Initial diagnosis Histologic grading system: 3 grade system - Pathologic stage from 02/09/2021: No Stage Recommended (ypT1a, pN0, cM0, G2, ER-, PR-, HER2-) - Signed by Malachy Mood, MD on 06/05/2021 Stage prefix: Post-therapy Response to neoadjuvant therapy: Partial response Histologic grading system: 3 grade system    Triple negative malignant neoplasm of breast  06/20/2020 Cancer Staging   Staging form: Breast, AJCC 8th Edition - Clinical stage from 06/20/2020: Stage IIIB (cT2, cN1, cM0, G2, ER-, PR-, HER2-) - Signed by Malachy Mood, MD on 06/05/2021 Stage prefix: Initial diagnosis Histologic grading system: 3 grade system   07/05/2020 Initial Diagnosis   Triple negative malignant neoplasm of breast (HCC)   08/01/2020 - 12/20/2020 Chemotherapy      Patient is on Antibody Plan: HEAD/NECK PEMBROLIZUMAB Q21D    08/17/2020 Genetic Testing   Negative genetic  testing:  No pathogenic variants detected on the [facility] CustomNext-Cancer + RNAinsight panel. A variant of uncertain significance (VUS) was detected in the MSH2 gene called c.1600C>T (p.R534C). The report date is 08/17/2020.  The CustomNext-Cancer+RNAinsight panel offered by [employer] includes sequencing and rearrangement analysis for up to 91 genes, which included the following 47 genes for Ms. Lauter:  APC*, ATM*, AXIN2, BARD1, BMPR1A, BRCA1*, BRCA2*, BRIP1*, CDH1*, CDK4, CDKN2A, CHEK2*, DICER1, EPCAM, GREM1, HOXB13, MEN1, MLH1*, MSH2*, MSH3, MSH6*, MUTYH*, NBN, NF1*, NF2, NTHL1, PALB2*, PMS2*, POLD1, POLE, PTEN*, RAD51C*, RAD51D*, RECQL, RET, SDHA, SDHAF2, SDHB, SDHC, SDHD, SMAD4, SMARCA4, STK11, TP53*, TSC1, TSC2, and VHL.  DNA and RNA analyses performed for * genes.     02/09/2021 Cancer Staging   Staging form: Breast, AJCC 8th Edition - Pathologic stage from 02/09/2021: No Stage Recommended (ypT1a, pN0, cM0, G2, ER-, PR-, HER2-) - Signed by Malachy Mood, MD on 06/05/2021 Stage prefix: Post-therapy Response to neoadjuvant therapy: Partial  response Histologic grading system: 3 grade system   06/18/2021 - 06/18/2021 Chemotherapy   Patient is on Treatment Plan : HEAD/NECK Pembrolizumab Q21D     Drug-induced neutropenia     INTERVAL HISTORY:   Elizabeth Graves is a 57 y.o. female presenting to clinic today for follow up of left breast cancer. She was last seen by me on 10/10/22.  Today, she states that she is doing well overall. Her appetite level is at 100%. Her energy level is at 100%.  PAST MEDICAL HISTORY:   Past Medical History: Past Medical History:  Diagnosis Date   Asthma    Breast cancer    Cancer    cervical - 20 yrs ago   Carpal tunnel syndrome    Depression    Family history of breast cancer    Family history of lung cancer    Hypertension    Hypokalemia    tx w/ k-Dur   Port-A-Cath in place 07/24/2020   Seasonal allergies    tx with benadryl prn   SVD (spontaneous vaginal delivery)    x 2    Surgical History: Past Surgical History:  Procedure Laterality Date   ABDOMINAL HYSTERECTOMY     CO2 Laser of Cervix     for cervical cancer 20 yrs ago   COLONOSCOPY WITH PROPOFOL N/A 11/16/2021   Procedure: COLONOSCOPY WITH PROPOFOL;  Surgeon: Lanelle Bal, DO;  [facility]: [facility];  Service: Endoscopy;  Laterality: N/A;  1:30 / ASA II   DILATION AND CURETTAGE OF UTERUS  12/2010   polyp removed   DILATION AND CURETTAGE OF UTERUS     x 3 for MAB   LAPAROSCOPIC ASSISTED VAGINAL HYSTERECTOMY  06/09/2012   Procedure: LAPAROSCOPIC ASSISTED VAGINAL HYSTERECTOMY;  Surgeon: Miguel Aschoff, MD;  Location: [facility];  Service: Gynecology;  Laterality: N/A;   MASTECTOMY MODIFIED RADICAL Left 02/09/2021   Procedure: MASTECTOMY MODIFIED RADICAL;  Surgeon: Lucretia Roers, MD;  Location: [facility];  Service: General;  Laterality: Left;   POLYPECTOMY  11/16/2021   Procedure: POLYPECTOMY;  Surgeon: Lanelle Bal, DO;  Location: [facility];  Service: Endoscopy;;  descending   PORTACATH PLACEMENT Right 07/17/2020    Procedure: INSERTION PORT-A-CATH;  Surgeon: Lucretia Roers, MD;  Location: [facility];  Service: General;  Laterality: Right;   SALPINGOOPHORECTOMY  06/09/2012   Procedure: SALPINGO OOPHERECTOMY;  Surgeon: Miguel Aschoff, MD;  Location: [facility];  Service: Gynecology;  Laterality: Bilateral;   TUBAL LIGATION      Social History: Social History  Socioeconomic History   Marital status: Married    Spouse name: Not on file   Number of children: 2   Years of education: Not on file   Highest education level: Not on file  Occupational History   Occupation: EMPLOYED  Tobacco Use   Smoking status: Former    Packs/day: 1.00    Years: 16.00    Additional pack years: 0.00    Total pack years: 16.00    Types: Cigarettes    Start date: 05/30/1994    Quit date: 01/31/2020    Years since quitting: 3.1   Smokeless tobacco: Never  Vaping Use   Vaping Use: Never used  Substance and Sexual Activity   Alcohol use: No   Drug use: No   Sexual activity: Yes    Birth control/protection: Surgical  Other Topics Concern   Not on file  Social History Narrative   Not on file   Social Determinants of Health   Financial Resource Strain: Low Risk  (07/04/2020)   Overall Financial Resource Strain (CARDIA)    Difficulty of Paying Living Expenses: Not very hard  Food Insecurity: No Food Insecurity (07/04/2020)   Hunger Vital Sign    Worried About Running Out of Food in the Last Year: Never true    Ran Out of Food in the Last Year: Never true  Transportation Needs: No Transportation Needs (07/04/2020)   PRAPARE - Administrator, Civil Service (Medical): No    Lack of Transportation (Non-Medical): No  Physical Activity: Inactive (07/04/2020)   Exercise Vital Sign    Days of Exercise per Week: 0 days    Minutes of Exercise per Session: 0 min  Stress: Stress Concern Present (07/04/2020)   Elizabeth [facility] [employer] Occupational [employer] - Occupational Stress Questionnaire    Feeling of Stress : To some extent   Social Connections: Moderately Isolated (07/04/2020)   Social Connection and Isolation Panel [NHANES]    Frequency of Communication with Friends and Family: Three times a week    Frequency of Social Gatherings with Friends and Family: Once a week    Attends Religious Services: Never    Database administrator or Organizations: No    Attends Banker Meetings: Never    Marital Status: Married  Catering manager Violence: Not At Risk (06/06/2021)   Humiliation, Afraid, Rape, and Kick questionnaire    Fear of Current or Ex-Partner: No    Emotionally Abused: No    Physically Abused: No    Sexually Abused: No    Family History: Family History  Problem Relation Age of Onset   Heart disease Mother    Lung cancer Mother 44       smoker   Heart disease Father    Esophageal cancer Father        GE junction, dx. in his early 7s   Diverticulitis Sister    Diabetes Sister    Diabetes Maternal Grandmother    Heart disease Maternal Grandmother    Throat cancer Maternal Grandmother        dx. late 37s   Healthy Sister    Healthy Daughter    Healthy Son    Breast cancer Paternal Aunt        limited info    Current Medications:  Current Outpatient Medications:    albuterol (PROAIR HFA) 108 (90 Base) MCG/ACT inhaler, Inhale 2 puffs into the lungs every 4 (four) hours as needed for wheezing or shortness of breath., Disp: 1  each, Rfl: 1   atorvastatin (LIPITOR) 20 MG tablet, Take 1 tablet (20 mg total) by mouth daily., Disp: 90 tablet, Rfl: 1   calcium carbonate (OSCAL) 1500 (600 Ca) MG TABS tablet, Take 600 mg of elemental calcium by mouth in the morning., Disp: , Rfl:    diphenhydrAMINE (BENADRYL) 25 mg capsule, Take 25 mg by mouth as needed for allergies. Allergies., Disp: , Rfl:    esomeprazole (NEXIUM) 20 MG capsule, Take 20 mg by mouth daily before breakfast., Disp: , Rfl:    ibuprofen (ADVIL,MOTRIN) 200 MG tablet, Take 400-600 mg by mouth as needed for moderate pain (pain.).,  Disp: , Rfl:    lisinopril (ZESTRIL) 5 MG tablet, Take 1 tablet (5 mg total) by mouth daily., Disp: 90 tablet, Rfl: 1   loratadine (CLARITIN) 10 MG tablet, Take 10 mg by mouth in the morning., Disp: , Rfl:    metFORMIN (GLUCOPHAGE-XR) 500 MG 24 hr tablet, 2 pills once daily and discontinue immediate release metformin, Disp: 60 tablet, Rfl: 5   naproxen sodium (ALEVE) 220 MG tablet, Take 440 mg by mouth as needed (pain.)., Disp: , Rfl:    sertraline (ZOLOFT) 50 MG tablet, Take 1 tablet (50 mg total) by mouth daily., Disp: 90 tablet, Rfl: 1   Allergies: No Known Allergies  REVIEW OF SYSTEMS:   Review of Systems  Constitutional:  Negative for chills, fatigue and fever.  HENT:   Negative for lump/mass, mouth sores, nosebleeds, sore throat and trouble swallowing.   Eyes:  Negative for eye problems.  Respiratory:  Negative for cough and shortness of breath.   Cardiovascular:  Negative for chest pain, leg swelling and palpitations.  Gastrointestinal:  Positive for diarrhea (From metformin). Negative for abdominal pain, constipation, nausea and vomiting.  Genitourinary:  Negative for bladder incontinence, difficulty urinating, dysuria, frequency, hematuria and nocturia.   Musculoskeletal:  Negative for arthralgias, back pain, flank pain, myalgias and neck pain.  Skin:  Negative for itching and rash.  Neurological:  Positive for numbness. Negative for dizziness and headaches.  Hematological:  Does not bruise/bleed easily.  Psychiatric/Behavioral:  Negative for depression, sleep disturbance and suicidal ideas. The patient is not nervous/anxious.   All other systems reviewed and are negative.    VITALS:   Blood pressure 138/82, pulse 76, temperature 98.1 F (36.7 C), temperature source Oral, resp. rate 18, weight 158 lb 11.7 oz (72 kg), SpO2 100 %.  Wt Readings from Last 3 Encounters:  04/10/23 158 lb 11.7 oz (72 kg)  04/01/23 158 lb (71.7 kg)  01/03/23 160 lb (72.6 kg)    Body mass index  is 29.93 kg/m.  Performance status (ECOG): 0 - Asymptomatic  PHYSICAL EXAM:   Physical Exam Vitals and nursing note reviewed. Exam conducted with a chaperone present.  Constitutional:      Appearance: Normal appearance.  Cardiovascular:     Rate and Rhythm: Normal rate and regular rhythm.     Pulses: Normal pulses.     Heart sounds: Normal heart sounds.  Pulmonary:     Effort: Pulmonary effort is normal.     Breath sounds: Normal breath sounds.  Abdominal:     Palpations: Abdomen is soft. There is no hepatomegaly, splenomegaly or mass.     Tenderness: There is no abdominal tenderness.  Musculoskeletal:     Right lower leg: No edema.     Left lower leg: No edema.  Lymphadenopathy:     Cervical: No cervical adenopathy.     Right cervical: No  superficial, deep or posterior cervical adenopathy.    Left cervical: No superficial, deep or posterior cervical adenopathy.     Upper Body:     Right upper body: No supraclavicular or axillary adenopathy.     Left upper body: No supraclavicular or axillary adenopathy.  Neurological:     General: No focal deficit present.     Mental Status: She is alert and oriented to person, place, and time.  Psychiatric:        Mood and Affect: Mood normal.        Behavior: Behavior normal.     LABS:      Latest Ref Rng & Units 04/03/2023    1:43 PM 10/04/2022    7:51 AM 03/25/2022    8:43 AM  CBC  WBC 4.0 - 10.5 K/uL 7.4  5.7  6.9   Hemoglobin 12.0 - 15.0 g/dL 40.912.9  81.113.0  91.414.0   Hematocrit 36.0 - 46.0 % 37.6  39.3  41.4   Platelets 150 - 400 K/uL 285  273  257       Latest Ref Rng & Units 04/03/2023    1:43 PM 10/04/2022    7:51 AM 03/25/2022    8:43 AM  CMP  Glucose 70 - 99 mg/dL 782102  956152  213155   BUN 6 - 20 mg/dL 13  14  17    Creatinine 0.44 - 1.00 mg/dL 0.860.87  5.780.87  4.690.89   Sodium 135 - 145 mmol/L 134  137  136   Potassium 3.5 - 5.1 mmol/L 3.3  3.7  3.9   Chloride 98 - 111 mmol/L 102  104  105   CO2 22 - 32 mmol/L 23  24  25    Calcium  8.9 - 10.3 mg/dL 8.8  9.0  9.0   Total Protein 6.5 - 8.1 g/dL 7.2  7.2  7.4   Total Bilirubin 0.3 - 1.2 mg/dL 0.5  0.7  0.4   Alkaline Phos 38 - 126 U/L 73  86  89   AST 15 - 41 U/L 21  19  18    ALT 0 - 44 U/L 27  24  27       No results found for: "CEA1", "CEA" / No results found for: "CEA1", "CEA" No results found for: "PSA1" No results found for: "[id number]" No results found for: "CAN125"  No results found for: "TOTALPROTELP", "ALBUMINELP", "A1GS", "A2GS", "BETS", "BETA2SER", "GAMS", "MSPIKE", "SPEI" No results found for: "TIBC", "FERRITIN", "IRONPCTSAT" Lab Results  Component Value Date   LDH 127 03/12/2021     STUDIES:   No results found.

## 2023-04-10 ENCOUNTER — Inpatient Hospital Stay (HOSPITAL_BASED_OUTPATIENT_CLINIC_OR_DEPARTMENT_OTHER): Payer: BC Managed Care – PPO | Admitting: Hematology

## 2023-04-10 ENCOUNTER — Encounter: Payer: Self-pay | Admitting: Hematology

## 2023-04-10 VITALS — BP 138/82 | HR 76 | Temp 98.1°F | Resp 18 | Wt 158.7 lb

## 2023-04-10 DIAGNOSIS — C50919 Malignant neoplasm of unspecified site of unspecified female breast: Secondary | ICD-10-CM | POA: Diagnosis not present

## 2023-04-10 DIAGNOSIS — Z853 Personal history of malignant neoplasm of breast: Secondary | ICD-10-CM | POA: Diagnosis not present

## 2023-04-10 NOTE — Patient Instructions (Addendum)
[facility]  Discharge Instructions  You were seen and examined today by Dr. Ellin Saba.  Dr. Ellin Saba discussed your most recent lab work which revealed everything looks good. Except your magnesium is low.  Follow-up as scheduled in 6 months with labs.    Thank you for choosing [facility] to provide your oncology and hematology care.   To afford each patient quality time with our provider, please arrive at least 15 minutes before your scheduled appointment time. You may need to reschedule your appointment if you arrive late (10 or more minutes). Arriving late affects you and other patients whose appointments are after yours.  Also, if you miss three or more appointments without notifying the office, you may be dismissed from the clinic at the provider's discretion.    Again, thank you for choosing [facility].  Our hope is that these requests will decrease the amount of time that you wait before being seen by our physicians.   If you have a lab appointment with the Cancer Center - please note that after April 8th, all labs will be drawn in the cancer center.  You do not have to check in or register with the main entrance as you have in the past but will complete your check-in at the cancer center.            _____________________________________________________________  Should you have questions after your visit to [facility], please contact our office at [phone] and follow the prompts.  Our office hours are 8:00 a.m. to 4:30 p.m. Monday - Thursday and 8:00 a.m. to 2:30 p.m. Friday.  Please note that voicemails left after 4:00 p.m. may not be returned until the following business day.  We are closed weekends and all major holidays.  You do have access to a nurse 24-7, just call the main number to the clinic [phone] and do not press any options, hold on the line and a nurse will answer the phone.     For prescription refill requests, have your pharmacy contact our office and allow 72 hours.    Masks are no longer required in the cancer centers. If you would like for your care team to wear a mask while they are taking care of you, please let them know. You may have one support person who is at least 55 years old accompany you for your appointments.

## 2023-04-18 ENCOUNTER — Other Ambulatory Visit: Payer: Self-pay | Admitting: Family Medicine

## 2023-05-07 ENCOUNTER — Encounter: Payer: Self-pay | Admitting: Family Medicine

## 2023-07-07 ENCOUNTER — Inpatient Hospital Stay: Payer: BC Managed Care – PPO | Attending: Hematology

## 2023-07-07 DIAGNOSIS — Z803 Family history of malignant neoplasm of breast: Secondary | ICD-10-CM | POA: Insufficient documentation

## 2023-07-07 DIAGNOSIS — I10 Essential (primary) hypertension: Secondary | ICD-10-CM | POA: Insufficient documentation

## 2023-07-07 DIAGNOSIS — E876 Hypokalemia: Secondary | ICD-10-CM | POA: Insufficient documentation

## 2023-07-07 DIAGNOSIS — Z95828 Presence of other vascular implants and grafts: Secondary | ICD-10-CM

## 2023-07-07 DIAGNOSIS — Z808 Family history of malignant neoplasm of other organs or systems: Secondary | ICD-10-CM | POA: Diagnosis not present

## 2023-07-07 DIAGNOSIS — Z90721 Acquired absence of ovaries, unilateral: Secondary | ICD-10-CM | POA: Diagnosis not present

## 2023-07-07 DIAGNOSIS — Z801 Family history of malignant neoplasm of trachea, bronchus and lung: Secondary | ICD-10-CM | POA: Insufficient documentation

## 2023-07-07 DIAGNOSIS — Z79899 Other long term (current) drug therapy: Secondary | ICD-10-CM | POA: Diagnosis not present

## 2023-07-07 DIAGNOSIS — Z90710 Acquired absence of both cervix and uterus: Secondary | ICD-10-CM | POA: Insufficient documentation

## 2023-07-07 DIAGNOSIS — Z8249 Family history of ischemic heart disease and other diseases of the circulatory system: Secondary | ICD-10-CM | POA: Insufficient documentation

## 2023-07-07 DIAGNOSIS — Z452 Encounter for adjustment and management of vascular access device: Secondary | ICD-10-CM | POA: Insufficient documentation

## 2023-07-07 DIAGNOSIS — Z853 Personal history of malignant neoplasm of breast: Secondary | ICD-10-CM | POA: Insufficient documentation

## 2023-07-07 DIAGNOSIS — Z833 Family history of diabetes mellitus: Secondary | ICD-10-CM | POA: Insufficient documentation

## 2023-07-07 DIAGNOSIS — Z8541 Personal history of malignant neoplasm of cervix uteri: Secondary | ICD-10-CM | POA: Diagnosis not present

## 2023-07-07 DIAGNOSIS — Z9012 Acquired absence of left breast and nipple: Secondary | ICD-10-CM | POA: Insufficient documentation

## 2023-07-07 DIAGNOSIS — Z8379 Family history of other diseases of the digestive system: Secondary | ICD-10-CM | POA: Insufficient documentation

## 2023-07-07 DIAGNOSIS — Z87891 Personal history of nicotine dependence: Secondary | ICD-10-CM | POA: Insufficient documentation

## 2023-07-07 DIAGNOSIS — C50919 Malignant neoplasm of unspecified site of unspecified female breast: Secondary | ICD-10-CM

## 2023-07-07 MED ORDER — HEPARIN SOD (PORK) LOCK FLUSH 100 UNIT/ML IV SOLN
500.0000 [IU] | Freq: Once | INTRAVENOUS | Status: AC
  Administered 2023-07-07: 500 [IU] via INTRAVENOUS

## 2023-07-07 MED ORDER — SODIUM CHLORIDE 0.9% FLUSH
10.0000 mL | Freq: Once | INTRAVENOUS | Status: AC
  Administered 2023-07-07: 10 mL via INTRAVENOUS

## 2023-07-07 NOTE — Progress Notes (Signed)
Patients port flushed without difficulty.  Good blood return noted with no bruising or swelling noted at site.  Band aid applied.  VSS with discharge and left in satisfactory condition with no s/s of distress noted.   

## 2023-07-07 NOTE — Patient Instructions (Signed)
[facility]  Discharge Instructions: Thank you for choosing [facility] to provide your oncology and hematology care.  If you have a lab appointment with the Cancer Center - please note that after April 8th, 2024, all labs will be drawn in the cancer center.  You do not have to check in or register with the main entrance as you have in the past but will complete your check-in in the cancer center.  Wear comfortable clothing and clothing appropriate for easy access to any Portacath or PICC line.   We strive to give you quality time with your provider. You may need to reschedule your appointment if you arrive late (15 or more minutes).  Arriving late affects you and other patients whose appointments are after yours.  Also, if you miss three or more appointments without notifying the office, you may be dismissed from the clinic at the provider's discretion.      For prescription refill requests, have your pharmacy contact our office and allow 72 hours for refills to be completed.  To help prevent nausea and vomiting after your treatment, we encourage you to take your nausea medication as directed.  BELOW ARE SYMPTOMS THAT SHOULD BE REPORTED IMMEDIATELY: *FEVER GREATER THAN 100.4 F (38 C) OR HIGHER *CHILLS OR SWEATING *NAUSEA AND VOMITING THAT IS NOT CONTROLLED WITH YOUR NAUSEA MEDICATION *UNUSUAL SHORTNESS OF BREATH *UNUSUAL BRUISING OR BLEEDING *URINARY PROBLEMS (pain or burning when urinating, or frequent urination) *BOWEL PROBLEMS (unusual diarrhea, constipation, pain near the anus) TENDERNESS IN MOUTH AND THROAT WITH OR WITHOUT PRESENCE OF ULCERS (sore throat, sores in mouth, or a toothache) UNUSUAL RASH, SWELLING OR PAIN  UNUSUAL VAGINAL DISCHARGE OR ITCHING   Items with * indicate a potential emergency and should be followed up as soon as possible or go to the Emergency Department if any problems should occur.  Please show the CHEMOTHERAPY ALERT CARD or  IMMUNOTHERAPY ALERT CARD at check-in to the Emergency Department and triage nurse.  Should you have questions after your visit or need to cancel or reschedule your appointment, please contact [facility] [phone]  and follow the prompts.  Office hours are 8:00 a.m. to 4:30 p.m. Monday - Friday. Please note that voicemails left after 4:00 p.m. may not be returned until the following business day.  We are closed weekends and major holidays. You have access to a nurse at all times for urgent questions. Please call the main number to the clinic [phone] and follow the prompts.  For any non-urgent questions, you may also contact your provider using MyChart. We now offer e-Visits for anyone 18 and older to request care online for non-urgent symptoms. For details visit [email or web address].   Also download the MyChart app! Go to the app store, search "MyChart", open the app, select [place], and log in with your MyChart username and password.   

## 2023-07-09 ENCOUNTER — Emergency Department (HOSPITAL_COMMUNITY)
Admission: EM | Admit: 2023-07-09 | Discharge: 2023-07-09 | Disposition: A | Discharge status: Home / Self Care | Payer: BC Managed Care – PPO | Attending: Emergency Medicine | Admitting: Emergency Medicine

## 2023-07-09 ENCOUNTER — Other Ambulatory Visit: Payer: Self-pay

## 2023-07-09 ENCOUNTER — Encounter (HOSPITAL_COMMUNITY): Payer: Self-pay

## 2023-07-09 DIAGNOSIS — Z87891 Personal history of nicotine dependence: Secondary | ICD-10-CM | POA: Diagnosis not present

## 2023-07-09 DIAGNOSIS — Z79899 Other long term (current) drug therapy: Secondary | ICD-10-CM | POA: Insufficient documentation

## 2023-07-09 DIAGNOSIS — E119 Type 2 diabetes mellitus without complications: Secondary | ICD-10-CM | POA: Insufficient documentation

## 2023-07-09 DIAGNOSIS — T63463A Toxic effect of venom of wasps, assault, initial encounter: Secondary | ICD-10-CM | POA: Diagnosis present

## 2023-07-09 DIAGNOSIS — Z7984 Long term (current) use of oral hypoglycemic drugs: Secondary | ICD-10-CM | POA: Diagnosis not present

## 2023-07-09 DIAGNOSIS — I10 Essential (primary) hypertension: Secondary | ICD-10-CM | POA: Diagnosis not present

## 2023-07-09 DIAGNOSIS — Z853 Personal history of malignant neoplasm of breast: Secondary | ICD-10-CM | POA: Diagnosis not present

## 2023-07-09 DIAGNOSIS — T63461A Toxic effect of venom of wasps, accidental (unintentional), initial encounter: Secondary | ICD-10-CM

## 2023-07-09 MED ORDER — PREDNISONE 10 MG PO TABS: 40.0000 mg | ORAL_TABLET | Freq: Every day | ORAL | 0 refills | Status: DC

## 2023-07-09 MED ORDER — DIPHENHYDRAMINE HCL 25 MG PO CAPS
25.0000 mg | ORAL_CAPSULE | Freq: Once | ORAL | Status: AC
  Administered 2023-07-09: 25 mg via ORAL
  Filled 2023-07-09: qty 1

## 2023-07-09 MED ORDER — FAMOTIDINE 20 MG PO TABS
20.0000 mg | ORAL_TABLET | Freq: Once | ORAL | Status: AC
  Administered 2023-07-09: 20 mg via ORAL
  Filled 2023-07-09: qty 1

## 2023-07-09 MED ORDER — FAMOTIDINE 20 MG PO TABS: 20.0000 mg | ORAL_TABLET | Freq: Two times a day (BID) | ORAL | 0 refills | Status: DC

## 2023-07-09 MED ORDER — PREDNISONE 50 MG PO TABS
60.0000 mg | ORAL_TABLET | Freq: Once | ORAL | Status: AC
  Administered 2023-07-09: 60 mg via ORAL
  Filled 2023-07-09: qty 1

## 2023-07-09 NOTE — ED Provider Notes (Addendum)
[place] EMERGENCY DEPARTMENT AT [facility] Provider Note   CSN: [id number] Arrival date & time: 07/09/23  1934     History  Chief Complaint  Patient presents with   Allergic Reaction    Elizabeth Graves is a 57 y.o. female.  Patient status post to wasp stings at 1700.  1 to the left shoulder the other to the right neck area.  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  With this 1 because of the sting on the neck she felt as if maybe her throat was swelling better her voice never changed.  She did not serially break out in any hives.  She took Benadryl at home just 25 mg at 1715.  Patient states she is feeling pretty good oxygen sats 99% respiration 16 heart rate 88.  Blood pressure 150/107.  Past medical history is significant for diabetes hypertension history of breast cancer.  Patient former tobacco user quit in 2021.       Home Medications Prior to Admission medications   Medication Sig Start Date End Date Taking? Authorizing Provider  famotidine (PEPCID) 20 MG tablet Take 1 tablet (20 mg total) by mouth 2 (two) times daily. 07/09/23  Yes Vanetta Mulders, MD  predniSONE (DELTASONE) 10 MG tablet Take 4 tablets (40 mg total) by mouth daily. 07/09/23  Yes Vanetta Mulders, MD  albuterol (PROAIR HFA) 108 (90 Base) MCG/ACT inhaler Inhale 2 puffs into the lungs every 4 (four) hours as needed for wheezing or shortness of breath. 04/01/23   Babs Sciara, MD  atorvastatin (LIPITOR) 20 MG tablet Take 1 tablet (20 mg total) by mouth daily. 04/01/23   Babs Sciara, MD  calcium carbonate (OSCAL) 1500 (600 Ca) MG TABS tablet Take 600 mg of elemental calcium by mouth in the morning.    [provider]  diphenhydrAMINE (BENADRYL) 25 mg capsule Take 25 mg by mouth as needed for allergies. Allergies.    [provider]  esomeprazole (NEXIUM) 20 MG capsule Take 20 mg by  mouth daily before breakfast.    [provider]  ibuprofen (ADVIL,MOTRIN) 200 MG tablet Take 400-600 mg by mouth as needed for moderate pain (pain.).    [provider]  lisinopril (ZESTRIL) 5 MG tablet Take 1 tablet (5 mg total) by mouth daily. 04/01/23   Babs Sciara, MD  loratadine (CLARITIN) 10 MG tablet Take 10 mg by mouth in the morning.    [provider]  metFORMIN (GLUCOPHAGE-XR) 500 MG 24 hr tablet 2 pills once daily and discontinue immediate release metformin 04/01/23   Luking, Jonna Coup, MD  naproxen sodium (ALEVE) 220 MG tablet Take 440 mg by mouth as needed (pain.).    [provider]  sertraline (ZOLOFT) 50 MG tablet Take 1 tablet (50 mg total) by mouth daily. 04/01/23   Babs Sciara, MD      Allergies    Patient has no known allergies.    Review of Systems   Review of Systems  Constitutional:  Negative for chills and fever.  HENT:  Negative for ear pain, facial swelling and sore throat.   Eyes:  Negative for pain and visual disturbance.  Respiratory:  Negative for cough, shortness of breath, wheezing and stridor.   Cardiovascular:  Negative for chest pain and palpitations.  Gastrointestinal:  Negative for abdominal pain and vomiting.  Genitourinary:  Negative for  dysuria and hematuria.  Musculoskeletal:  Negative for arthralgias and back pain.  Skin:  Negative for color change and rash.  Neurological:  Negative for seizures and syncope.  All other systems reviewed and are negative.   Physical Exam Updated Vital Signs BP (!) 150/107 (BP Location: Right Arm)   Pulse 88   Temp 98.1 F (36.7 C) (Oral)   Resp 16   SpO2 99%  Physical Exam Vitals and nursing note reviewed.  Constitutional:      General: She is not in acute distress.    Appearance: Normal appearance. She is well-developed.  HENT:     Head: Normocephalic and atraumatic.     Mouth/Throat:     Mouth: Mucous membranes are moist.     Pharynx: Oropharynx is clear.      Comments: No lip swelling no tongue swelling.  Uvula midline. Eyes:     Extraocular Movements: Extraocular movements intact.     Conjunctiva/sclera: Conjunctivae normal.     Pupils: Pupils are equal, round, and reactive to light.  Cardiovascular:     Rate and Rhythm: Normal rate and regular rhythm.     Heart sounds: No murmur heard. Pulmonary:     Effort: Pulmonary effort is normal. No respiratory distress.     Breath sounds: Normal breath sounds. No stridor. No wheezing, rhonchi or rales.  Chest:     Chest wall: No tenderness.  Abdominal:     Palpations: Abdomen is soft.     Tenderness: There is no abdominal tenderness.  Musculoskeletal:        General: No swelling.     Cervical back: Normal range of motion and neck supple. No rigidity.  Skin:    General: Skin is warm and dry.     Capillary Refill: Capillary refill takes less than 2 seconds.     Findings: Rash present.     Comments: Some erythema at the site of the stings 1 to the left shoulder 1 to the right anterior neck.  Neurological:     General: No focal deficit present.     Mental Status: She is alert and oriented to person, place, and time.  Psychiatric:        Mood and Affect: Mood normal.     ED Results / Procedures / Treatments   Labs (all labs ordered are listed, but only abnormal results are displayed) Labs Reviewed - No data to display  EKG None  Radiology No results found.  Procedures Procedures    Medications Ordered in ED Medications  famotidine (PEPCID) tablet 20 mg (20 mg Oral Given 07/09/23 2140)  predniSONE (DELTASONE) tablet 60 mg (60 mg Oral Given 07/09/23 2140)  diphenhydrAMINE (BENADRYL) capsule 25 mg (25 mg Oral Given 07/09/23 2140)    ED Course/ Medical Decision Making/ A&P                             Medical Decision Making Risk Prescription drug management.   Toxic no acute distress.  No evidence of any significant allergic reaction.  Some local reaction.  Will treat patient  here with another dose of Benadryl p.o. Pepcid p.o. and prednisone p.o.  Will discharge home with prednisone for the next 5 days Pepcid for the next 7 days.  And Benadryl every 6 hours for the next 24 hours.  Precautions provided patient will return for any new or worse symptoms.  I do not think the patient needs to be sent  home with EpiPen's.  This does not seem to be any kind of significant allergic reaction   Final Clinical Impression(s) / ED Diagnoses Final diagnoses:  Wasp sting, accidental or unintentional, initial encounter    Rx / DC Orders ED Discharge Orders          Ordered    predniSONE (DELTASONE) 10 MG tablet  Daily        07/09/23 2142    famotidine (PEPCID) 20 MG tablet  2 times daily        07/09/23 2142              Vanetta Mulders, MD 07/09/23 2142    Vanetta Mulders, MD 07/09/23 [phone]

## 2023-07-09 NOTE — ED Triage Notes (Signed)
Pt c/o wasp sting that happened 5pm tonight. Pt was stung on throat and left shoulder. States she was told she was allergic when younger. She is now complaining of some throat swelling. Took benadryl PTA. No distress noted. Able to speak clear sentences.

## 2023-07-09 NOTE — Discharge Instructions (Addendum)
Take the prednisone for the next 5 days.  Take Benadryl 25 mg every 6 hours for the next 24 hours.  Take the Pepcid for the next 7 days.  Prescriptions provided.  You received your doses here tonight so you can pick up your prescriptions tomorrow.  Return for any difficulty breathing any throat swelling prickly of the voice changes.  Any swelling to the tongue.

## 2023-07-10 ENCOUNTER — Telehealth: Payer: Self-pay

## 2023-07-10 NOTE — Transitions of Care (Post Inpatient/ED Visit) (Signed)
   07/10/2023  Name: Elizabeth Graves MRN: [id number] DOB: [birth year]  Today's TOC FU Call Status: Today's TOC FU Call Status:: Unsuccessul Call (1st Attempt) Unsuccessful Call (1st Attempt) Date: 07/10/23  Attempted to reach the patient regarding the most recent Inpatient/ED visit.  Follow Up Plan: Additional outreach attempts will be made to reach the patient to complete the Transitions of Care (Post Inpatient/ED visit) call.   Signature  Karena Addison, LPN [facility] Nurse Health Advisor Direct Dial [phone]

## 2023-07-13 ENCOUNTER — Other Ambulatory Visit: Payer: Self-pay | Admitting: Family Medicine

## 2023-07-15 NOTE — Transitions of Care (Post Inpatient/ED Visit) (Signed)
07/15/2023  Name: Elizabeth Graves MRN: [id number] DOB: 11-Oct-1966  Today's TOC FU Call Status: Today's TOC FU Call Status:: Successful TOC FU Call Competed Unsuccessful Call (1st Attempt) Date: 07/10/23 [facility] FU Call Complete Date: 07/15/23  Transition Care Management Follow-up Telephone Call Date of Discharge: 07/09/23 [facility] Type of Discharge: Emergency Department Reason for ED Visit: Other: (wasp sting) How have you been since you were released from the hospital?: Better Any questions or concerns?: No  Items Reviewed: Did you receive and understand the discharge instructions provided?: Yes Medications obtained,verified, and reconciled?: Yes (Medications Reviewed) Any new allergies since your discharge?: No Dietary orders reviewed?: Yes Do you have support at home?: No  Medications Reviewed Today: Medications Reviewed Today     Reviewed by Karena Addison, LPN (Licensed Practical Nurse) on 07/15/23 at 1618  Med List Status: <None>   Medication Order Taking? Sig Documenting Provider Last Dose Status Informant  albuterol (PROAIR HFA) 108 (90 Base) MCG/ACT inhaler [id number] No Inhale 2 puffs into the lungs every 4 (four) hours as needed for wheezing or shortness of breath. Babs Sciara, MD Taking Active   atorvastatin (LIPITOR) 20 MG tablet [id number]  TAKE 1 TABLET BY MOUTH EVERY DAY Luking, Scott A, MD  Active   calcium carbonate (OSCAL) 1500 (600 Ca) MG TABS tablet [id number] No Take 600 mg of elemental calcium by mouth in the morning. [provider] Taking Active Self  diphenhydrAMINE (BENADRYL) 25 mg capsule [id number] No Take 25 mg by mouth as needed for allergies. Allergies. [provider] Taking Active Self  esomeprazole (NEXIUM) 20 MG capsule [id number] No Take 20 mg by mouth daily before breakfast. [provider] Taking Active Self  famotidine (PEPCID) 20 MG tablet [id number]  Take 1 tablet (20 mg total) by mouth 2  (two) times daily. Vanetta Mulders, MD  Active   ibuprofen (ADVIL,MOTRIN) 200 MG tablet [id number] No Take 400-600 mg by mouth as needed for moderate pain (pain.). [provider] Taking Active Self  lisinopril (ZESTRIL) 5 MG tablet [id number] No Take 1 tablet (5 mg total) by mouth daily. Babs Sciara, MD Taking Active   loratadine (CLARITIN) 10 MG tablet [id number] No Take 10 mg by mouth in the morning. [provider] Taking Active Self  metFORMIN (GLUCOPHAGE-XR) 500 MG 24 hr tablet [id number] No 2 pills once daily and discontinue immediate release metformin Luking, Jonna Coup, MD Taking Active   naproxen sodium (ALEVE) 220 MG tablet [id number] No Take 440 mg by mouth as needed (pain.). [provider] Taking Active Self  predniSONE (DELTASONE) 10 MG tablet [id number]  Take 4 tablets (40 mg total) by mouth daily. Vanetta Mulders, MD  Active   sertraline (ZOLOFT) 50 MG tablet [id number] No Take 1 tablet (50 mg total) by mouth daily. Babs Sciara, MD Taking Active   Med List Note Remi Haggard, RPH-CPP 03/16/21 1603): Xeloda filled at [facility]            Home Care and Equipment/Supplies: Were Home Health Services Ordered?: NA Any new equipment or medical supplies ordered?: NA  Functional Questionnaire: Do you need assistance with bathing/showering or dressing?: No Do you need assistance with meal preparation?: No Do you need assistance with eating?: No Do you have difficulty maintaining continence: No Do you need assistance with getting out of bed/getting out of a chair/moving?: No Do you have difficulty managing or taking your medications?: No  Follow up appointments reviewed: PCP Follow-up appointment confirmed?:  NA Specialist Hospital Follow-up appointment confirmed?: NA Do you need transportation to your follow-up appointment?: No Do you understand care options if your condition(s) worsen?: Yes-patient verbalized  understanding    SIGNATURE Karena Addison, LPN [facility] Nurse Health Advisor Direct Dial [phone]

## 2023-08-25 ENCOUNTER — Ambulatory Visit (HOSPITAL_COMMUNITY)
Admission: RE | Admit: 2023-08-25 | Discharge: 2023-08-25 | Disposition: A | Discharge status: Home / Self Care | Payer: BC Managed Care – PPO | Source: Ambulatory Visit | Attending: Hematology | Admitting: Hematology

## 2023-08-25 DIAGNOSIS — C50919 Malignant neoplasm of unspecified site of unspecified female breast: Secondary | ICD-10-CM | POA: Insufficient documentation

## 2023-08-25 DIAGNOSIS — Z1231 Encounter for screening mammogram for malignant neoplasm of breast: Secondary | ICD-10-CM | POA: Diagnosis not present

## 2023-08-25 DIAGNOSIS — Z17421 Hormone receptor negative with human epidermal growth factor receptor 2 negative status: Secondary | ICD-10-CM

## 2023-09-21 ENCOUNTER — Other Ambulatory Visit: Payer: Self-pay | Admitting: Family Medicine

## 2023-09-24 ENCOUNTER — Other Ambulatory Visit: Payer: Self-pay | Admitting: Family Medicine

## 2023-09-24 ENCOUNTER — Ambulatory Visit: Payer: BC Managed Care – PPO | Admitting: Family Medicine

## 2023-10-09 ENCOUNTER — Inpatient Hospital Stay: Payer: BC Managed Care – PPO | Attending: Hematology

## 2023-10-09 DIAGNOSIS — Z803 Family history of malignant neoplasm of breast: Secondary | ICD-10-CM | POA: Insufficient documentation

## 2023-10-09 DIAGNOSIS — E876 Hypokalemia: Secondary | ICD-10-CM | POA: Diagnosis not present

## 2023-10-09 DIAGNOSIS — Z8249 Family history of ischemic heart disease and other diseases of the circulatory system: Secondary | ICD-10-CM | POA: Insufficient documentation

## 2023-10-09 DIAGNOSIS — Z90721 Acquired absence of ovaries, unilateral: Secondary | ICD-10-CM | POA: Insufficient documentation

## 2023-10-09 DIAGNOSIS — Z853 Personal history of malignant neoplasm of breast: Secondary | ICD-10-CM | POA: Diagnosis present

## 2023-10-09 DIAGNOSIS — Z9012 Acquired absence of left breast and nipple: Secondary | ICD-10-CM | POA: Diagnosis not present

## 2023-10-09 DIAGNOSIS — Z79899 Other long term (current) drug therapy: Secondary | ICD-10-CM | POA: Diagnosis not present

## 2023-10-09 DIAGNOSIS — Z87891 Personal history of nicotine dependence: Secondary | ICD-10-CM | POA: Insufficient documentation

## 2023-10-09 DIAGNOSIS — Z8541 Personal history of malignant neoplasm of cervix uteri: Secondary | ICD-10-CM | POA: Diagnosis not present

## 2023-10-09 DIAGNOSIS — I10 Essential (primary) hypertension: Secondary | ICD-10-CM | POA: Insufficient documentation

## 2023-10-09 DIAGNOSIS — Z801 Family history of malignant neoplasm of trachea, bronchus and lung: Secondary | ICD-10-CM | POA: Insufficient documentation

## 2023-10-09 DIAGNOSIS — Z90710 Acquired absence of both cervix and uterus: Secondary | ICD-10-CM | POA: Insufficient documentation

## 2023-10-09 DIAGNOSIS — C50919 Malignant neoplasm of unspecified site of unspecified female breast: Secondary | ICD-10-CM

## 2023-10-09 DIAGNOSIS — Z833 Family history of diabetes mellitus: Secondary | ICD-10-CM | POA: Diagnosis not present

## 2023-10-09 DIAGNOSIS — Z452 Encounter for adjustment and management of vascular access device: Secondary | ICD-10-CM | POA: Insufficient documentation

## 2023-10-09 DIAGNOSIS — Z808 Family history of malignant neoplasm of other organs or systems: Secondary | ICD-10-CM | POA: Diagnosis not present

## 2023-10-09 DIAGNOSIS — Z8379 Family history of other diseases of the digestive system: Secondary | ICD-10-CM | POA: Diagnosis not present

## 2023-10-09 LAB — CBC WITH DIFFERENTIAL/PLATELET
Abs Immature Granulocytes: 0.02 10*3/uL (ref 0.00–0.07)
Basophils Absolute: 0 10*3/uL (ref 0.0–0.1)
Basophils Relative: 1 %
Eosinophils Absolute: 0.2 10*3/uL (ref 0.0–0.5)
Eosinophils Relative: 3 %
HCT: 36.7 % (ref 36.0–46.0)
Hemoglobin: 12.2 g/dL (ref 12.0–15.0)
Immature Granulocytes: 0 %
Lymphocytes Relative: 28 %
Lymphs Abs: 1.9 10*3/uL (ref 0.7–4.0)
MCH: 27.9 pg (ref 26.0–34.0)
MCHC: 33.2 g/dL (ref 30.0–36.0)
MCV: 84 fL (ref 80.0–100.0)
Monocytes Absolute: 0.5 10*3/uL (ref 0.1–1.0)
Monocytes Relative: 8 %
Neutro Abs: 4.1 10*3/uL (ref 1.7–7.7)
Neutrophils Relative %: 60 %
Platelets: 263 10*3/uL (ref 150–400)
RBC: 4.37 MIL/uL (ref 3.87–5.11)
RDW: 13.7 % (ref 11.5–15.5)
WBC: 6.8 10*3/uL (ref 4.0–10.5)
nRBC: 0 % (ref 0.0–0.2)

## 2023-10-09 LAB — COMPREHENSIVE METABOLIC PANEL
ALT: 32 U/L (ref 0–44)
AST: 24 U/L (ref 15–41)
Albumin: 3.9 g/dL (ref 3.5–5.0)
Alkaline Phosphatase: 64 U/L (ref 38–126)
Anion gap: 8 (ref 5–15)
BUN: 13 mg/dL (ref 6–20)
CO2: 25 mmol/L (ref 22–32)
Calcium: 8.9 mg/dL (ref 8.9–10.3)
Chloride: 103 mmol/L (ref 98–111)
Creatinine, Ser: 0.97 mg/dL (ref 0.44–1.00)
GFR, Estimated: >60 mL/min (ref >60)
Glucose, Bld: 139 mg/dL — ABNORMAL HIGH (ref 70–99)
Potassium: 3.4 mmol/L — ABNORMAL LOW (ref 3.5–5.1)
Sodium: 136 mmol/L (ref 135–145)
Total Bilirubin: 0.5 mg/dL (ref 0.3–1.2)
Total Protein: 7 g/dL (ref 6.5–8.1)

## 2023-10-09 LAB — MAGNESIUM: Magnesium: 1.7 mg/dL (ref 1.7–2.4)

## 2023-10-09 MED ORDER — SODIUM CHLORIDE 0.9% FLUSH
10.0000 mL | INTRAVENOUS | Status: DC | PRN
  Administered 2023-10-09: 10 mL via INTRAVENOUS

## 2023-10-09 MED ORDER — HEPARIN SOD (PORK) LOCK FLUSH 100 UNIT/ML IV SOLN
500.0000 [IU] | Freq: Once | INTRAVENOUS | Status: AC
  Administered 2023-10-09: 500 [IU] via INTRAVENOUS

## 2023-10-09 NOTE — Progress Notes (Signed)
Elizabeth Graves presented for Portacath access and flush. Proper placement of portacath confirmed by CXR. Portacath located right  chest wall accessed with  H 20 needle. Good blood return present. Portacath flushed with 20ml NS and 500U/48ml Heparin and needle removed intact. Procedure without incident. Patient tolerated procedure well.

## 2023-10-09 NOTE — Patient Instructions (Signed)
[facility]  Discharge Instructions: Thank you for choosing [facility] to provide your oncology and hematology care.  If you have a lab appointment with the Cancer Center - please note that after April 8th, 2024, all labs will be drawn in the cancer center.  You do not have to check in or register with the main entrance as you have in the past but will complete your check-in in the cancer center.  Wear comfortable clothing and clothing appropriate for easy access to any Portacath or PICC line.   We strive to give you quality time with your provider. You may need to reschedule your appointment if you arrive late (15 or more minutes).  Arriving late affects you and other patients whose appointments are after yours.  Also, if you miss three or more appointments without notifying the office, you may be dismissed from the clinic at the provider's discretion.      For prescription refill requests, have your pharmacy contact our office and allow 72 hours for refills to be completed.    Today you received the following: port flush with labs.       To help prevent nausea and vomiting after your treatment, we encourage you to take your nausea medication as directed.  BELOW ARE SYMPTOMS THAT SHOULD BE REPORTED IMMEDIATELY: *FEVER GREATER THAN 100.4 F (38 C) OR HIGHER *CHILLS OR SWEATING *NAUSEA AND VOMITING THAT IS NOT CONTROLLED WITH YOUR NAUSEA MEDICATION *UNUSUAL SHORTNESS OF BREATH *UNUSUAL BRUISING OR BLEEDING *URINARY PROBLEMS (pain or burning when urinating, or frequent urination) *BOWEL PROBLEMS (unusual diarrhea, constipation, pain near the anus) TENDERNESS IN MOUTH AND THROAT WITH OR WITHOUT PRESENCE OF ULCERS (sore throat, sores in mouth, or a toothache) UNUSUAL RASH, SWELLING OR PAIN  UNUSUAL VAGINAL DISCHARGE OR ITCHING   Items with * indicate a potential emergency and should be followed up as soon as possible or go to the Emergency Department if any  problems should occur.  Please show the CHEMOTHERAPY ALERT CARD or IMMUNOTHERAPY ALERT CARD at check-in to the Emergency Department and triage nurse.  Should you have questions after your visit or need to cancel or reschedule your appointment, please contact [facility] CENTER AT [facility] [phone]  and follow the prompts.  Office hours are 8:00 a.m. to 4:30 p.m. Monday - Friday. Please note that voicemails left after 4:00 p.m. may not be returned until the following business day.  We are closed weekends and major holidays. You have access to a nurse at all times for urgent questions. Please call the main number to the clinic [phone] and follow the prompts.  For any non-urgent questions, you may also contact your provider using MyChart. We now offer e-Visits for anyone 53 and older to request care online for non-urgent symptoms. For details visit [email or web address].   Also download the MyChart app! Go to the app store, search "MyChart", open the app, select , and log in with your MyChart username and password.

## 2023-10-10 LAB — CANCER ANTIGEN 27.29: CA 27.29: 16.1 U/mL (ref 0.0–38.6)

## 2023-10-10 LAB — CANCER ANTIGEN 15-3: CA 15-3: 13.5 U/mL (ref 0.0–25.0)

## 2023-10-16 ENCOUNTER — Encounter: Payer: Self-pay | Admitting: Hematology

## 2023-10-16 ENCOUNTER — Inpatient Hospital Stay: Payer: BC Managed Care – PPO | Admitting: Hematology

## 2023-10-16 VITALS — Wt 160.3 lb

## 2023-10-16 DIAGNOSIS — Z452 Encounter for adjustment and management of vascular access device: Secondary | ICD-10-CM | POA: Diagnosis not present

## 2023-10-16 DIAGNOSIS — C50919 Malignant neoplasm of unspecified site of unspecified female breast: Secondary | ICD-10-CM

## 2023-10-16 DIAGNOSIS — Z17421 Hormone receptor negative with human epidermal growth factor receptor 2 negative status: Secondary | ICD-10-CM

## 2023-10-16 NOTE — Patient Instructions (Addendum)
[facility]  Discharge Instructions  You were seen and examined today by Dr. Ellin Saba.  Dr. Ellin Saba discussed your most recent lab work which revealed that everything looks good and stable.  Follow-up as scheduled in 6 months.    Thank you for choosing [facility] to provide your oncology and hematology care.   To afford each patient quality time with our provider, please arrive at least 15 minutes before your scheduled appointment time. You may need to reschedule your appointment if you arrive late (10 or more minutes). Arriving late affects you and other patients whose appointments are after yours.  Also, if you miss three or more appointments without notifying the office, you may be dismissed from the clinic at the provider's discretion.    Again, thank you for choosing [facility].  Our hope is that these requests will decrease the amount of time that you wait before being seen by our physicians.   If you have a lab appointment with the Cancer Center - please note that after April 8th, all labs will be drawn in the cancer center.  You do not have to check in or register with the main entrance as you have in the past but will complete your check-in at the cancer center.            _____________________________________________________________  Should you have questions after your visit to [facility], please contact our office at [phone] and follow the prompts.  Our office hours are 8:00 a.m. to 4:30 p.m. Monday - Thursday and 8:00 a.m. to 2:30 p.m. Friday.  Please note that voicemails left after 4:00 p.m. may not be returned until the following business day.  We are closed weekends and all major holidays.  You do have access to a nurse 24-7, just call the main number to the clinic [phone] and do not press any options, hold on the line and a nurse will answer the phone.    For prescription  refill requests, have your pharmacy contact our office and allow 72 hours.    Masks are no longer required in the cancer centers. If you would like for your care team to wear a mask while they are taking care of you, please let them know. You may have one support person who is at least 55 years old accompany you for your appointments.

## 2023-10-16 NOTE — Progress Notes (Signed)
[facility] [address], [place] [address]    Clinic Day:  10/16/2023  Referring physician: Babs Sciara, MD  Patient Care Team: Babs Sciara, MD as PCP - General (Family Medicine) Therese Sarah, RN as Oncology Nurse Navigator (Oncology) Mickie Bail, RN as Oncology Nurse Navigator (Oncology)   ASSESSMENT & PLAN:   Assessment: 1.  Triple negative left breast cancer (T2N1): -Patient identified left breast mass with left nipple retraction about 2 months ago. -Mammogram on 06/13/2020 showed mass in the retroareolar 12 o'clock position in the left breast.  2 suspicious left axillary lymph nodes, largest measuring 2 cm. -Breast ultrasound showed 2.7 x 1.6 x 2.2 cm mass in the 12:00 retroareolar position.  Just lateral to the mass is a calcified hypoechoic nodule that corresponds to densely calcified degenerating fibroadenoma. -Left breast 12:00 biopsy showed invasive ductal carcinoma, grade 2/3.  Left axillary lymph node biopsy was consistent with meta stasis. -ER negative, PR negative and Ki-67 15%.  HER-2 equivocal by IHC.  HER-2 FISH negative. -MRI of the breast on 07/19/2020 shows 4.2 cm enhancing mass in the subareolar left breast, enhancement extending to the level of the left nipple with associated nipple retraction and periareolar skin thickening.  6 morphologically abnormal level 1 left axillary lymph nodes.  No evidence of malignancy in the right breast. -CT CAP on 07/14/2020 shows dominant left axillary nodes measuring 1.8 cm short axis.  Small left subpectoral nodes measuring up to 7 mm short axis, suspicious.  Small mediastinal lymph nodes measuring 7 to 9 mm, thought to be reactive.  No other evidence of metastatic disease. -Neoadjuvant Dose dense AC started on 08/01/2020. -11 cycles of carboplatin and Taxol she completed on 12/20/2020. -Left mastectomy and axillary lymph node biopsy on 02/09/2021-pathology with 0.2 cm grade 2 IDC, margins negative,  0/12 lymph nodes involved, [id number]. - 6 cycles of adjuvant capecitabine from 03/20/2021 through 07/04/2021. [employer] has denied request for pembrolizumab based on keynote study.   2.  Family history: -Father had gastroesophageal junction cancer.  Maternal grandmother had throat cancer and paternal aunt had breast cancer. -[facility] genetics testing showed MSH2 VUS.   3.  High risk drug monitoring: -Echo on 07/12/2020 shows EF 60 to 65%.   Plan: 1.  T2N1 left breast TNBC: - Left mastectomy site is within normal limits.  Right breast has no palpable masses.  No palpable adenopathy. - Labs from 10/09/2023: Normal LFTs.  CBC grossly normal.  Tumor markers are within normal limits. - Mammogram of the right breast on 08/25/2023: BI-RADS Category 1. - She may have her port discontinued. - Recommend follow-up in 6 months with repeat labs and exam.   2.  Hypokalemia: - Continue potassium OTC tablet daily.  Potassium is 3.4 today..   3.  Hypomagnesemia: - Continue OTC magnesium tablet daily.  Magnesium is 1.7.   4.  Bone health: - Last vitamin D level was 51.  Continue calcium and vitamin D supplements. - We will obtain DEXA scan prior to next visit.  Orders Placed This Encounter  Procedures   DG Bone Density    Standing Status:   Future    Standing Expiration Date:   10/15/2024    Order Specific Question:   Reason for Exam (SYMPTOM  OR DIAGNOSIS REQUIRED)    Answer:   personal hx of breast cancer    Order Specific Question:   Is patient pregnant?    Answer:   No  Order Specific Question:   Preferred imaging location?    Answer:   [facility]    Order Specific Question:   Release to patient    Answer:   Immediate   CBC with Differential/Platelet    Standing Status:   Future    Standing Expiration Date:   10/15/2024    Order Specific Question:   Release to patient    Answer:   Immediate   Comprehensive metabolic panel    Standing Status:   Future    Standing  Expiration Date:   10/15/2024    Order Specific Question:   Release to patient    Answer:   Immediate   Magnesium    Standing Status:   Future    Standing Expiration Date:   10/15/2024    Order Specific Question:   Release to patient    Answer:   Immediate   Cancer antigen 15-3    Standing Status:   Future    Standing Expiration Date:   10/15/2024   VITAMIN D 25 Hydroxy (Vit-D Deficiency, Fractures)    Standing Status:   Future    Standing Expiration Date:   10/15/2024    Order Specific Question:   Release to patient    Answer:   Immediate   Cancer antigen 27.29    Standing Status:   Future    Standing Expiration Date:   10/15/2024      Mikeal Hawthorne R Teague,acting as a scribe for Doreatha Massed, MD.,have documented all relevant documentation on the behalf of Doreatha Massed, MD,as directed by  Doreatha Massed, MD while in the presence of Doreatha Massed, MD.  I, Doreatha Massed MD, have reviewed the above documentation for accuracy and completeness, and I agree with the above.    Doreatha Massed, MD   10/17/20247:12 PM  CHIEF COMPLAINT:   Diagnosis: left breast cancer    Cancer Staging  Triple negative malignant neoplasm of breast [facility]) Staging form: Breast, AJCC 8th Edition - Clinical stage from 06/20/2020: Stage IIIB (cT2, cN1, cM0, G2, ER-, PR-, HER2-) - Signed by Malachy Mood, MD on 06/05/2021 - Pathologic stage from 02/09/2021: No Stage Recommended (ypT1a, pN0, cM0, G2, ER-, PR-, HER2-) - Signed by Malachy Mood, MD on 06/05/2021    Prior Therapy: Neoadjuvant chemotherapy, left mastectomy  Current Therapy:  surveillance   HISTORY OF PRESENT ILLNESS:   Oncology History Overview Note  Cancer Staging Triple negative malignant neoplasm of breast (HCC) Staging form: Breast, AJCC 8th Edition - Clinical stage from 06/20/2020: Stage IIIB (cT2, cN1, cM0, G2, ER-, PR-, HER2-) - Signed by Malachy Mood, MD on 06/05/2021 Stage prefix: Initial diagnosis Histologic  grading system: 3 grade system - Pathologic stage from 02/09/2021: No Stage Recommended (ypT1a, pN0, cM0, G2, ER-, PR-, HER2-) - Signed by Malachy Mood, MD on 06/05/2021 Stage prefix: Post-therapy Response to neoadjuvant therapy: Partial response Histologic grading system: 3 grade system    Triple negative malignant neoplasm of breast (HCC)  06/20/2020 Cancer Staging   Staging form: Breast, AJCC 8th Edition - Clinical stage from 06/20/2020: Stage IIIB (cT2, cN1, cM0, G2, ER-, PR-, HER2-) - Signed by Malachy Mood, MD on 06/05/2021 Stage prefix: Initial diagnosis Histologic grading system: 3 grade system   07/05/2020 Initial Diagnosis   Triple negative malignant neoplasm of breast (HCC)   08/01/2020 - 12/20/2020 Chemotherapy      Patient is on Antibody Plan: HEAD/NECK PEMBROLIZUMAB Q21D    08/17/2020 Genetic Testing   Negative genetic testing:  No pathogenic variants  detected on the [facility] CustomNext-Cancer + RNAinsight panel. A variant of uncertain significance (VUS) was detected in the MSH2 gene called c.1600C>T (p.R534C). The report date is 08/17/2020.  The CustomNext-Cancer+RNAinsight panel offered by [employer] includes sequencing and rearrangement analysis for up to 91 genes, which included the following 47 genes for Ms. Brose:  APC*, ATM*, AXIN2, BARD1, BMPR1A, BRCA1*, BRCA2*, BRIP1*, CDH1*, CDK4, CDKN2A, CHEK2*, DICER1, EPCAM, GREM1, HOXB13, MEN1, MLH1*, MSH2*, MSH3, MSH6*, MUTYH*, NBN, NF1*, NF2, NTHL1, PALB2*, PMS2*, POLD1, POLE, PTEN*, RAD51C*, RAD51D*, RECQL, RET, SDHA, SDHAF2, SDHB, SDHC, SDHD, SMAD4, SMARCA4, STK11, TP53*, TSC1, TSC2, and VHL.  DNA and RNA analyses performed for * genes.     02/09/2021 Cancer Staging   Staging form: Breast, AJCC 8th Edition - Pathologic stage from 02/09/2021: No Stage Recommended (ypT1a, pN0, cM0, G2, ER-, PR-, HER2-) - Signed by Malachy Mood, MD on 06/05/2021 Stage prefix: Post-therapy Response to neoadjuvant therapy: Partial response Histologic grading system:  3 grade system   06/18/2021 - 06/18/2021 Chemotherapy   Patient is on Treatment Plan : HEAD/NECK Pembrolizumab Q21D     Drug-induced neutropenia (HCC)     INTERVAL HISTORY:   Elizabeth Graves is a 57 y.o. female presenting to clinic today for follow up of left breast cancer. She was last seen by me on 04/10/23.  Since her last visit, she underwent right MM on 08/25/23 that found: no mammographic evidence of malignancy. She presented to the ED on 07/09/23 for a wasp sting. She was given a dose of Benadryl, Pepcid, and Prednisone while hospitalized. She was discharged with prednisone 10 mg OD for 5 days and Pepcid 20 mg BID for 7 days.  Today, she states that she is doing well overall. Her appetite level is at 100%. Her energy level is at 50%. She denies any new aches, pains, or medical issues since our last visit. She is taking OTC magnesium OD, calcium, and Vitamin D. She has not done a DXA scan in her lifetime.    PAST MEDICAL HISTORY:   Past Medical History: Past Medical History:  Diagnosis Date   Asthma    Breast cancer (HCC)    Cancer (HCC)    cervical - 20 yrs ago   Carpal tunnel syndrome    Depression    Family history of breast cancer    Family history of lung cancer    Hypertension    Hypokalemia    tx w/ k-Dur   Port-A-Cath in place 07/24/2020   Seasonal allergies    tx with benadryl prn   SVD (spontaneous vaginal delivery)    x 2    Surgical History: Past Surgical History:  Procedure Laterality Date   ABDOMINAL HYSTERECTOMY     CO2 Laser of Cervix     for cervical cancer 20 yrs ago   COLONOSCOPY WITH PROPOFOL N/A 11/16/2021   Procedure: COLONOSCOPY WITH PROPOFOL;  Surgeon: Lanelle Bal, DO;  Location: [facility];  Service: Endoscopy;  Laterality: N/A;  1:30 / ASA II   DILATION AND CURETTAGE OF UTERUS  12/2010   polyp removed   DILATION AND CURETTAGE OF UTERUS     x 3 for MAB   LAPAROSCOPIC ASSISTED VAGINAL HYSTERECTOMY  06/09/2012   Procedure: LAPAROSCOPIC ASSISTED  VAGINAL HYSTERECTOMY;  Surgeon: Miguel Aschoff, MD;  Location: [facility];  Service: Gynecology;  Laterality: N/A;   MASTECTOMY MODIFIED RADICAL Left 02/09/2021   Procedure: MASTECTOMY MODIFIED RADICAL;  Surgeon: Lucretia Roers, MD;  Location: [facility];  Service: General;  Laterality: Left;   POLYPECTOMY  11/16/2021   Procedure: POLYPECTOMY;  Surgeon: Lanelle Bal, DO;  Location: [facility];  Service: Endoscopy;;  descending   PORTACATH PLACEMENT Right 07/17/2020   Procedure: INSERTION PORT-A-CATH;  Surgeon: Lucretia Roers, MD;  Location: [facility];  Service: General;  Laterality: Right;   SALPINGOOPHORECTOMY  06/09/2012   Procedure: SALPINGO OOPHERECTOMY;  Surgeon: Miguel Aschoff, MD;  Location: [facility];  Service: Gynecology;  Laterality: Bilateral;   TUBAL LIGATION      Social History: Social History   Socioeconomic History   Marital status: Married    Spouse name: Not on file   Number of children: 2   Years of education: Not on file   Highest education level: Not on file  Occupational History   Occupation: EMPLOYED  Tobacco Use   Smoking status: Former    Current packs/day: 0.00    Average packs/day: 1 pack/day for 25.7 years (25.7 ttl pk-yrs)    Types: Cigarettes    Start date: 05/30/1994    Quit date: 01/31/2020    Years since quitting: 3.7   Smokeless tobacco: Never  Vaping Use   Vaping status: Never Used  Substance and Sexual Activity   Alcohol use: No   Drug use: No   Sexual activity: Yes    Birth control/protection: Surgical  Other Topics Concern   Not on file  Social History Narrative   Not on file   Social Determinants of Health   Financial Resource Strain: Low Risk  (07/04/2020)   Overall Financial Resource Strain (CARDIA)    Difficulty of Paying Living Expenses: Not very hard  Food Insecurity: No Food Insecurity (07/04/2020)   Hunger Vital Sign    Worried About Running Out of Food in the Last Year: Never true    Ran Out of Food in the Last Year: Never true   Transportation Needs: No Transportation Needs (07/04/2020)   PRAPARE - Administrator, Civil Service (Medical): No    Lack of Transportation (Non-Medical): No  Physical Activity: Inactive (07/04/2020)   Exercise Vital Sign    Days of Exercise per Week: 0 days    Minutes of Exercise per Session: 0 min  Stress: Stress Concern Present (07/04/2020)   [employer] of Occupational Health - Occupational Stress Questionnaire    Feeling of Stress : To some extent  Social Connections: Moderately Isolated (07/04/2020)   Social Connection and Isolation Panel [NHANES]    Frequency of Communication with Friends and Family: Three times a week    Frequency of Social Gatherings with Friends and Family: Once a week    Attends Religious Services: Never    Database administrator or Organizations: No    Attends Banker Meetings: Never    Marital Status: Married  Catering manager Violence: Not At Risk (06/06/2021)   Humiliation, Afraid, Rape, and Kick questionnaire    Fear of Current or Ex-Partner: No    Emotionally Abused: No    Physically Abused: No    Sexually Abused: No    Family History: Family History  Problem Relation Age of Onset   Heart disease Mother    Lung cancer Mother 33       smoker   Heart disease Father    Esophageal cancer Father        GE junction, dx. in his early 37s   Diverticulitis Sister    Diabetes Sister    Diabetes Maternal Grandmother    Heart disease  Maternal Grandmother    Throat cancer Maternal Grandmother        dx. late 52s   Healthy Sister    Healthy Daughter    Healthy Son    Breast cancer Paternal Aunt        limited info    Current Medications:  Current Outpatient Medications:    albuterol (PROAIR HFA) 108 (90 Base) MCG/ACT inhaler, Inhale 2 puffs into the lungs every 4 (four) hours as needed for wheezing or shortness of breath., Disp: 1 each, Rfl: 1   atorvastatin (LIPITOR) 20 MG tablet, TAKE 1 TABLET BY MOUTH EVERY DAY, Disp:  90 tablet, Rfl: 1   calcium carbonate (OSCAL) 1500 (600 Ca) MG TABS tablet, Take 600 mg of elemental calcium by mouth in the morning., Disp: , Rfl:    diphenhydrAMINE (BENADRYL) 25 mg capsule, Take 25 mg by mouth as needed for allergies. Allergies., Disp: , Rfl:    esomeprazole (NEXIUM) 20 MG capsule, Take 20 mg by mouth daily before breakfast., Disp: , Rfl:    ibuprofen (ADVIL,MOTRIN) 200 MG tablet, Take 400-600 mg by mouth as needed for moderate pain (pain.)., Disp: , Rfl:    lisinopril (ZESTRIL) 5 MG tablet, TAKE 1 TABLET (5 MG TOTAL) BY MOUTH DAILY., Disp: 90 tablet, Rfl: 1   loratadine (CLARITIN) 10 MG tablet, Take 10 mg by mouth in the morning., Disp: , Rfl:    metFORMIN (GLUCOPHAGE-XR) 500 MG 24 hr tablet, 2 PILLS ONCE DAILY AND DISCONTINUE IMMEDIATE RELEASE METFORMIN, Disp: 180 tablet, Rfl: 1   sertraline (ZOLOFT) 50 MG tablet, TAKE 1 TABLET BY MOUTH EVERY DAY, Disp: 90 tablet, Rfl: 0   Allergies: No Known Allergies  REVIEW OF SYSTEMS:   Review of Systems  Constitutional:  Negative for chills, fatigue and fever.  HENT:   Negative for lump/mass, mouth sores, nosebleeds, sore throat and trouble swallowing.   Eyes:  Negative for eye problems.  Respiratory:  Negative for cough and shortness of breath.   Cardiovascular:  Negative for chest pain, leg swelling and palpitations.  Gastrointestinal:  Positive for diarrhea. Negative for abdominal pain, constipation, nausea and vomiting.  Genitourinary:  Negative for bladder incontinence, difficulty urinating, dysuria, frequency, hematuria and nocturia.   Musculoskeletal:  Negative for arthralgias, back pain, flank pain, myalgias and neck pain.  Skin:  Negative for itching and rash.  Neurological:  Positive for numbness (left hand). Negative for dizziness and headaches.  Hematological:  Does not bruise/bleed easily.  Psychiatric/Behavioral:  Negative for depression, sleep disturbance and suicidal ideas. The patient is not nervous/anxious.    All other systems reviewed and are negative.    VITALS:   Weight 160 lb 4.8 oz (72.7 kg).  Wt Readings from Last 3 Encounters:  10/16/23 160 lb 4.8 oz (72.7 kg)  04/10/23 158 lb 11.7 oz (72 kg)  04/01/23 158 lb (71.7 kg)    Body mass index is 30.23 kg/m.  Performance status (ECOG): 0 - Asymptomatic  PHYSICAL EXAM:   Physical Exam Vitals and nursing note reviewed. Exam conducted with a chaperone present.  Constitutional:      Appearance: Normal appearance.  Cardiovascular:     Rate and Rhythm: Normal rate and regular rhythm.     Pulses: Normal pulses.     Heart sounds: Normal heart sounds.  Pulmonary:     Effort: Pulmonary effort is normal.     Breath sounds: Normal breath sounds.  Chest:     Comments: +left mastectomy site WNL +no suspicious nodules  +no  supraclavicular adenopathy +right chest port Abdominal:     Palpations: Abdomen is soft. There is no hepatomegaly, splenomegaly or mass.     Tenderness: There is no abdominal tenderness.  Musculoskeletal:     Right lower leg: No edema.     Left lower leg: No edema.  Lymphadenopathy:     Cervical: No cervical adenopathy.     Right cervical: No superficial, deep or posterior cervical adenopathy.    Left cervical: No superficial, deep or posterior cervical adenopathy.     Upper Body:     Right upper body: No supraclavicular or axillary adenopathy.     Left upper body: No supraclavicular or axillary adenopathy.  Neurological:     General: No focal deficit present.     Mental Status: She is alert and oriented to person, place, and time.  Psychiatric:        Mood and Affect: Mood normal.        Behavior: Behavior normal.   Breast Exam Chaperone: Anne Fu, LPN   LABS:      Latest Ref Rng & Units 10/09/2023    3:14 PM 04/03/2023    1:43 PM 10/04/2022    7:51 AM  CBC  WBC 4.0 - 10.5 K/uL 6.8  7.4  5.7   Hemoglobin 12.0 - 15.0 g/dL 16.1  09.6  04.5   Hematocrit 36.0 - 46.0 % 36.7  37.6  39.3   Platelets  150 - 400 K/uL 263  285  273       Latest Ref Rng & Units 10/09/2023    3:14 PM 04/03/2023    1:43 PM 10/04/2022    7:51 AM  CMP  Glucose 70 - 99 mg/dL 409  811  914   BUN 6 - 20 mg/dL 13  13  14    Creatinine 0.44 - 1.00 mg/dL 7.82  9.56  2.13   Sodium 135 - 145 mmol/L 136  134  137   Potassium 3.5 - 5.1 mmol/L 3.4  3.3  3.7   Chloride 98 - 111 mmol/L 103  102  104   CO2 22 - 32 mmol/L 25  23  24    Calcium 8.9 - 10.3 mg/dL 8.9  8.8  9.0   Total Protein 6.5 - 8.1 g/dL 7.0  7.2  7.2   Total Bilirubin 0.3 - 1.2 mg/dL 0.5  0.5  0.7   Alkaline Phos 38 - 126 U/L 64  73  86   AST 15 - 41 U/L 24  21  19    ALT 0 - 44 U/L 32  27  24      No results found for: "CEA1", "CEA" / No results found for: "CEA1", "CEA" No results found for: "PSA1" No results found for: "YQM578" No results found for: "CAN125"  No results found for: "TOTALPROTELP", "ALBUMINELP", "A1GS", "A2GS", "BETS", "BETA2SER", "GAMS", "MSPIKE", "SPEI" No results found for: "TIBC", "FERRITIN", "IRONPCTSAT" Lab Results  Component Value Date   LDH 127 03/12/2021     STUDIES:   No results found.

## 2023-10-23 ENCOUNTER — Ambulatory Visit (HOSPITAL_COMMUNITY)
Admission: RE | Admit: 2023-10-23 | Discharge: 2023-10-23 | Disposition: A | Discharge status: Home / Self Care | Payer: BC Managed Care – PPO | Source: Ambulatory Visit | Attending: Hematology | Admitting: Hematology

## 2023-10-23 DIAGNOSIS — C50919 Malignant neoplasm of unspecified site of unspecified female breast: Secondary | ICD-10-CM | POA: Diagnosis present

## 2023-10-23 DIAGNOSIS — Z17421 Hormone receptor negative with human epidermal growth factor receptor 2 negative status: Secondary | ICD-10-CM | POA: Diagnosis present

## 2023-12-18 ENCOUNTER — Other Ambulatory Visit: Payer: Self-pay | Admitting: Family Medicine

## 2023-12-18 ENCOUNTER — Ambulatory Visit: Payer: BC Managed Care – PPO | Admitting: General Surgery

## 2023-12-18 VITALS — BP 139/80 | HR 89 | Temp 97.9°F | Resp 12 | Ht 61.0 in | Wt 158.0 lb

## 2023-12-18 DIAGNOSIS — Z95828 Presence of other vascular implants and grafts: Secondary | ICD-10-CM

## 2023-12-18 NOTE — Progress Notes (Signed)
[facility] History and Physical  Reason for Referral: Port in place  Referring Physician: Dr. Ellin Saba   Chief Complaint   Follow-up     Elizabeth Graves is a 57 y.o. female.  HPI: Elizabeth Graves is a 57 yo who comes in with a history of triple negative breast cancer s/p treatment and modified radical mastectomy who is done with treatment. She is ready to get her port out now. She has no issues and is otherwise doing well.  Past Medical History:  Diagnosis Date   Asthma    Breast cancer (HCC)    Cancer (HCC)    cervical - 20 yrs ago   Carpal tunnel syndrome    Depression    Family history of breast cancer    Family history of lung cancer    Hypertension    Hypokalemia    tx w/ k-Dur   Port-A-Cath in place 07/24/2020   Seasonal allergies    tx with benadryl prn   SVD (spontaneous vaginal delivery)    x 2    Past Surgical History:  Procedure Laterality Date   ABDOMINAL HYSTERECTOMY     CO2 Laser of Cervix     for cervical cancer 20 yrs ago   COLONOSCOPY WITH PROPOFOL N/A 11/16/2021   Procedure: COLONOSCOPY WITH PROPOFOL;  Surgeon: Lanelle Bal, DO;  [facility];  Service: Endoscopy;  Laterality: N/A;  1:30 / ASA II   DILATION AND CURETTAGE OF UTERUS  12/2010   polyp removed   DILATION AND CURETTAGE OF UTERUS     x 3 for MAB   LAPAROSCOPIC ASSISTED VAGINAL HYSTERECTOMY  06/09/2012   Procedure: LAPAROSCOPIC ASSISTED VAGINAL HYSTERECTOMY;  Surgeon: Miguel Aschoff, MD;  Location: [facility];  Service: Gynecology;  Laterality: N/A;   MASTECTOMY MODIFIED RADICAL Left 02/09/2021   Procedure: MASTECTOMY MODIFIED RADICAL;  Surgeon: Lucretia Roers, MD;  Location: [facility];  Service: General;  Laterality: Left;   POLYPECTOMY  11/16/2021   Procedure: POLYPECTOMY;  Surgeon: Lanelle Bal, DO;  [facility];  Service: Endoscopy;;  descending   PORTACATH PLACEMENT Right 07/17/2020   Procedure: INSERTION PORT-A-CATH;  Surgeon: Lucretia Roers,  MD;  Location: [facility];  Service: General;  Laterality: Right;   SALPINGOOPHORECTOMY  06/09/2012   Procedure: SALPINGO OOPHERECTOMY;  Surgeon: Miguel Aschoff, MD;  Location: [facility];  Service: Gynecology;  Laterality: Bilateral;   TUBAL LIGATION      Family History  Problem Relation Age of Onset   Heart disease Mother    Lung cancer Mother 44       smoker   Heart disease Father    Esophageal cancer Father        GE junction, dx. in his early 33s   Diverticulitis Sister    Diabetes Sister    Diabetes Maternal Grandmother    Heart disease Maternal Grandmother    Throat cancer Maternal Grandmother        dx. late 3s   Healthy Sister    Healthy Daughter    Healthy Son    Breast cancer Paternal Aunt        limited info    Social History   Tobacco Use   Smoking status: Former    Current packs/day: 0.00    Average packs/day: 1 pack/day for 25.7 years (25.7 ttl pk-yrs)    Types: Cigarettes    Start date: 05/30/1994    Quit date: 01/31/2020    Years since quitting: 3.8  Smokeless tobacco: Never  Vaping Use   Vaping status: Never Used  Substance Use Topics   Alcohol use: No   Drug use: No    Medications: I have reviewed the patient's current medications. Allergies as of 12/18/2023   No Known Allergies      Medication List        Accurate as of December 18, 2023  3:39 PM. If you have any questions, ask your nurse or doctor.          albuterol 108 (90 Base) MCG/ACT inhaler Commonly known as: ProAir HFA Inhale 2 puffs into the lungs every 4 (four) hours as needed for wheezing or shortness of breath.   atorvastatin 20 MG tablet Commonly known as: LIPITOR TAKE 1 TABLET BY MOUTH EVERY DAY   calcium carbonate 1500 (600 Ca) MG Tabs tablet Commonly known as: OSCAL Take 600 mg of elemental calcium by mouth in the morning.   diphenhydrAMINE 25 mg capsule Commonly known as: BENADRYL Take 25 mg by mouth as needed for allergies. Allergies.   esomeprazole 20 MG  capsule Commonly known as: NEXIUM Take 20 mg by mouth daily before breakfast.   ibuprofen 200 MG tablet Commonly known as: ADVIL Take 400-600 mg by mouth as needed for moderate pain (pain.).   lisinopril 5 MG tablet Commonly known as: ZESTRIL TAKE 1 TABLET (5 MG TOTAL) BY MOUTH DAILY.   loratadine 10 MG tablet Commonly known as: CLARITIN Take 10 mg by mouth in the morning.   metFORMIN 500 MG 24 hr tablet Commonly known as: GLUCOPHAGE-XR 2 PILLS ONCE DAILY AND DISCONTINUE IMMEDIATE RELEASE METFORMIN   sertraline 50 MG tablet Commonly known as: ZOLOFT TAKE 1 TABLET BY MOUTH EVERY DAY         ROS:  A comprehensive review of systems was negative except for: Cardiovascular: positive for HTN Neurological: positive for numbness Endocrine: positive for diabetes  Blood pressure 139/80, pulse 89, temperature 97.9 F (36.6 C), temperature source Oral, resp. rate 12, height 5\' 1"  (1.549 m), weight 158 lb (71.7 kg), SpO2 97%. Physical Exam Vitals reviewed.  HENT:     Nose: Nose normal.     Mouth/Throat:     Mouth: Mucous membranes are moist.  Eyes:     Extraocular Movements: Extraocular movements intact.  Cardiovascular:     Rate and Rhythm: Normal rate and regular rhythm.  Pulmonary:     Effort: Pulmonary effort is normal.     Breath sounds: Normal breath sounds.  Chest:     Comments: Right chest port in place Abdominal:     Palpations: Abdomen is soft.     Tenderness: There is no abdominal tenderness.  Musculoskeletal:        General: Normal range of motion.     Cervical back: Normal range of motion.  Skin:    General: Skin is warm.  Neurological:     General: No focal deficit present.     Mental Status: Mental status is at baseline.  Psychiatric:        Mood and Affect: Mood normal.        Thought Content: Thought content normal.     Results: None  Assessment & Plan:  Elizabeth Graves is a 57 y.o. female with a port a catheter in place on the right via the  subclavian. Discussed excision with local. Risk of bleeding, infection, incomplete removal, vessel injury, CXR to confirm removal.  -Port removal in procedure room    All questions were answered  to the satisfaction of the patient.   Lucretia Roers 12/18/2023, 3:39 PM

## 2023-12-18 NOTE — Patient Instructions (Signed)
Implanted Port Removal  Implanted port removal is a procedure to remove the port and catheter that are implanted under your skin. The port is a small disc under your skin that can be punctured with a needle. It is connected to a vein in your chest or neck by a small, thin tube (catheter). The implanted port is used to give medicines for treatments, and it may also be used to take blood samples. Your health care provider will remove the implanted port if: You no longer need it for treatment. It is not working properly. The area around it gets infected. Tell a health care provider about: Any allergies you have. All medicines you are taking, including vitamins, herbs, eye drops, creams, and over-the-counter medicines. Any problems you or family members have had with anesthetic medicines. Any bleeding problems you have. Any surgeries you have had. Any medical conditions you have. Whether you are pregnant or may be pregnant. What are the risks? Generally, this is a safe procedure. However, problems may occur, including: Infection. Bleeding. Allergic reactions to anesthetic medicines. Damage to nerves or blood vessels. What happens before the procedure? Medicines Ask your health care provider about: Changing or stopping your regular medicines. This is especially important if you are taking diabetes medicines or blood thinners. Taking medicines such as aspirin and ibuprofen. These medicines can thin your blood. Do not take these medicines unless your health care provider tells you to take them. Taking over-the-counter medicines, vitamins, herbs, and supplements. Tests You will have: A physical exam. Blood tests. Imaging tests, including a chest X-ray. General instructions Follow instructions from your health care provider about eating or drinking restrictions. Ask your health care provider: How your surgery site will be marked. What steps will be taken to help prevent infection. These  steps may include: Removing hair at the surgery site. Washing skin with a germ-killing soap. Taking antibiotic medicine. If you will be going home right after the procedure, plan to have a responsible adult: Take you home from the hospital or clinic. You will not be allowed to drive. Care for you for the time you are told. What happens during the procedure? You may be given one or more of the following: A medicine to help you relax (sedative). A medicine to numb the area (local anesthetic). A small incision will be made at the site of your implanted port. The implanted port and the catheter that has been inside your vein will be gently removed. The port and catheter will be inspected to make sure that all the parts have been removed. Part of the catheter may be tested for bacteria. The incision will be closed with stitches (sutures), adhesive strips, or skin glue. A bandage (dressing) will be placed over the incision. The health care provider may apply gentle pressure over the dressing for about 5 minutes. The procedure may vary among health care providers and hospitals. What happens after the procedure? Your blood pressure, heart rate, breathing rate, and blood oxygen level will be monitored until you leave the hospital or clinic. You will be monitored to make sure that there is no bleeding from the site where the port was removed. If you were given a sedative during the procedure, it can affect you for several hours. Do not drive or operate machinery until your health care provider says that it is safe. Summary Implanted port removal is a procedure to remove the port and catheter that are implanted under your skin. Before the procedure, follow your health  care provider's instructions about changing or stopping your regular medicines. This is especially important if you are taking diabetes medicines or blood thinners. If you will be going home right after the procedure, plan to have a  responsible adult care for you for the time you are told. This information is not intended to replace advice given to you by your health care provider. Make sure you discuss any questions you have with your health care provider. Document Revised: 06/19/2021 Document Reviewed: 06/19/2021 Elsevier Patient Education  2024 [employer].

## 2023-12-19 NOTE — H&P (Signed)
[facility] History and Physical  Reason for Referral: Port in place  Referring Physician: Dr. Ellin Saba   Chief Complaint   Follow-up     Elizabeth Graves is a 57 y.o. female.  HPI: Elizabeth Graves is a 57 yo who comes in with a history of triple negative breast cancer s/p treatment and modified radical mastectomy who is done with treatment. She is ready to get her port out now. She has no issues and is otherwise doing well.  Past Medical History:  Diagnosis Date   Asthma    Breast cancer (HCC)    Cancer (HCC)    cervical - 20 yrs ago   Carpal tunnel syndrome    Depression    Family history of breast cancer    Family history of lung cancer    Hypertension    Hypokalemia    tx w/ k-Dur   Port-A-Cath in place 07/24/2020   Seasonal allergies    tx with benadryl prn   SVD (spontaneous vaginal delivery)    x 2    Past Surgical History:  Procedure Laterality Date   ABDOMINAL HYSTERECTOMY     CO2 Laser of Cervix     for cervical cancer 20 yrs ago   COLONOSCOPY WITH PROPOFOL N/A 11/16/2021   Procedure: COLONOSCOPY WITH PROPOFOL;  Surgeon: Lanelle Bal, DO;  [facility];  Service: Endoscopy;  Laterality: N/A;  1:30 / ASA II   DILATION AND CURETTAGE OF UTERUS  12/2010   polyp removed   DILATION AND CURETTAGE OF UTERUS     x 3 for MAB   LAPAROSCOPIC ASSISTED VAGINAL HYSTERECTOMY  06/09/2012   Procedure: LAPAROSCOPIC ASSISTED VAGINAL HYSTERECTOMY;  Surgeon: Miguel Aschoff, MD;  Location: [facility];  Service: Gynecology;  Laterality: N/A;   MASTECTOMY MODIFIED RADICAL Left 02/09/2021   Procedure: MASTECTOMY MODIFIED RADICAL;  Surgeon: Lucretia Roers, MD;  Location: [facility];  Service: General;  Laterality: Left;   POLYPECTOMY  11/16/2021   Procedure: POLYPECTOMY;  Surgeon: Lanelle Bal, DO;  [facility];  Service: Endoscopy;;  descending   PORTACATH PLACEMENT Right 07/17/2020   Procedure: INSERTION PORT-A-CATH;  Surgeon: Lucretia Roers,  MD;  Location: [facility];  Service: General;  Laterality: Right;   SALPINGOOPHORECTOMY  06/09/2012   Procedure: SALPINGO OOPHERECTOMY;  Surgeon: Miguel Aschoff, MD;  Location: [facility];  Service: Gynecology;  Laterality: Bilateral;   TUBAL LIGATION      Family History  Problem Relation Age of Onset   Heart disease Mother    Lung cancer Mother 33       smoker   Heart disease Father    Esophageal cancer Father        GE junction, dx. in his early 62s   Diverticulitis Sister    Diabetes Sister    Diabetes Maternal Grandmother    Heart disease Maternal Grandmother    Throat cancer Maternal Grandmother        dx. late 62s   Healthy Sister    Healthy Daughter    Healthy Son    Breast cancer Paternal Aunt        limited info    Social History   Tobacco Use   Smoking status: Former    Current packs/day: 0.00    Average packs/day: 1 pack/day for 25.7 years (25.7 ttl pk-yrs)    Types: Cigarettes    Start date: 05/30/1994    Quit date: 01/31/2020    Years since quitting: 3.8  Smokeless tobacco: Never  Vaping Use   Vaping status: Never Used  Substance Use Topics   Alcohol use: No   Drug use: No    Medications: I have reviewed the patient's current medications. Allergies as of 12/18/2023   No Known Allergies      Medication List        Accurate as of December 18, 2023  3:39 PM. If you have any questions, ask your nurse or doctor.          albuterol 108 (90 Base) MCG/ACT inhaler Commonly known as: ProAir HFA Inhale 2 puffs into the lungs every 4 (four) hours as needed for wheezing or shortness of breath.   atorvastatin 20 MG tablet Commonly known as: LIPITOR TAKE 1 TABLET BY MOUTH EVERY DAY   calcium carbonate 1500 (600 Ca) MG Tabs tablet Commonly known as: OSCAL Take 600 mg of elemental calcium by mouth in the morning.   diphenhydrAMINE 25 mg capsule Commonly known as: BENADRYL Take 25 mg by mouth as needed for allergies. Allergies.   esomeprazole 20 MG  capsule Commonly known as: NEXIUM Take 20 mg by mouth daily before breakfast.   ibuprofen 200 MG tablet Commonly known as: ADVIL Take 400-600 mg by mouth as needed for moderate pain (pain.).   lisinopril 5 MG tablet Commonly known as: ZESTRIL TAKE 1 TABLET (5 MG TOTAL) BY MOUTH DAILY.   loratadine 10 MG tablet Commonly known as: CLARITIN Take 10 mg by mouth in the morning.   metFORMIN 500 MG 24 hr tablet Commonly known as: GLUCOPHAGE-XR 2 PILLS ONCE DAILY AND DISCONTINUE IMMEDIATE RELEASE METFORMIN   sertraline 50 MG tablet Commonly known as: ZOLOFT TAKE 1 TABLET BY MOUTH EVERY DAY         ROS:  A comprehensive review of systems was negative except for: Cardiovascular: positive for HTN Neurological: positive for numbness Endocrine: positive for diabetes  Blood pressure 139/80, pulse 89, temperature 97.9 F (36.6 C), temperature source Oral, resp. rate 12, height 5\' 1"  (1.549 m), weight 158 lb (71.7 kg), SpO2 97%. Physical Exam Vitals reviewed.  HENT:     Nose: Nose normal.     Mouth/Throat:     Mouth: Mucous membranes are moist.  Eyes:     Extraocular Movements: Extraocular movements intact.  Cardiovascular:     Rate and Rhythm: Normal rate and regular rhythm.  Pulmonary:     Effort: Pulmonary effort is normal.     Breath sounds: Normal breath sounds.  Chest:     Comments: Right chest port in place Abdominal:     Palpations: Abdomen is soft.     Tenderness: There is no abdominal tenderness.  Musculoskeletal:        General: Normal range of motion.     Cervical back: Normal range of motion.  Skin:    General: Skin is warm.  Neurological:     General: No focal deficit present.     Mental Status: Mental status is at baseline.  Psychiatric:        Mood and Affect: Mood normal.        Thought Content: Thought content normal.     Results: None  Assessment & Plan:  Elizabeth Graves is a 57 y.o. female with a port a catheter in place on the right via the  subclavian. Discussed excision with local. Risk of bleeding, infection, incomplete removal, vessel injury, CXR to confirm removal.  -Port removal in procedure room    All questions were answered  to the satisfaction of the patient.   Lucretia Roers 12/18/2023, 3:39 PM

## 2024-01-02 ENCOUNTER — Encounter (HOSPITAL_COMMUNITY)
Admission: RE | Disposition: A | Discharge status: Home / Self Care | Payer: Self-pay | Source: Home / Self Care | Attending: General Surgery

## 2024-01-02 ENCOUNTER — Ambulatory Visit (HOSPITAL_COMMUNITY): Payer: BC Managed Care – PPO

## 2024-01-02 ENCOUNTER — Encounter (HOSPITAL_COMMUNITY): Payer: Self-pay | Admitting: General Surgery

## 2024-01-02 ENCOUNTER — Ambulatory Visit (HOSPITAL_COMMUNITY)
Admission: RE | Admit: 2024-01-02 | Discharge: 2024-01-02 | Disposition: A | Discharge status: Home / Self Care | Payer: BC Managed Care – PPO | Attending: General Surgery | Admitting: General Surgery

## 2024-01-02 DIAGNOSIS — Z87891 Personal history of nicotine dependence: Secondary | ICD-10-CM | POA: Insufficient documentation

## 2024-01-02 DIAGNOSIS — Z452 Encounter for adjustment and management of vascular access device: Secondary | ICD-10-CM | POA: Insufficient documentation

## 2024-01-02 DIAGNOSIS — Z95828 Presence of other vascular implants and grafts: Secondary | ICD-10-CM

## 2024-01-02 DIAGNOSIS — Z853 Personal history of malignant neoplasm of breast: Secondary | ICD-10-CM | POA: Diagnosis not present

## 2024-01-02 HISTORY — PX: PORT-A-CATH REMOVAL: SHX5289

## 2024-01-02 LAB — GLUCOSE, CAPILLARY: Glucose-Capillary: 138 mg/dL — ABNORMAL HIGH (ref 70–99)

## 2024-01-02 SURGERY — MINOR REMOVAL PORT-A-CATH
Anesthesia: LOCAL

## 2024-01-02 MED ORDER — LIDOCAINE HCL (PF) 1 % IJ SOLN
INTRAMUSCULAR | Status: AC
Start: 2024-01-02 — End: ?
  Filled 2024-01-02: qty 30

## 2024-01-02 MED ORDER — HYDROCODONE-ACETAMINOPHEN 5-325 MG PO TABS: 1.0000 | ORAL_TABLET | ORAL | 0 refills | Status: DC | PRN

## 2024-01-02 MED ORDER — LIDOCAINE HCL (PF) 1 % IJ SOLN
INTRAMUSCULAR | Status: DC | PRN
  Administered 2024-01-02: 10 mL

## 2024-01-02 SURGICAL SUPPLY — 13 items
APPLICATOR CHLORAPREP 10.5 ORG (MISCELLANEOUS) IMPLANT
CLOTH BEACON ORANGE TIMEOUT ST (SAFETY) ×1 IMPLANT
DECANTER SPIKE VIAL GLASS SM (MISCELLANEOUS) ×1 IMPLANT
DERMABOND ADVANCED .7 DNX12 (GAUZE/BANDAGES/DRESSINGS) ×1 IMPLANT
DRAPE HALF SHEET 40X57 (DRAPES) IMPLANT
GLOVE BIO SURGEON STRL SZ 6.5 (GLOVE) ×1 IMPLANT
GLOVE BIOGEL PI IND STRL 6.5 (GLOVE) ×1 IMPLANT
GLOVE BIOGEL PI IND STRL 7.0 (GLOVE) ×2 IMPLANT
GOWN STRL REUS W/TWL LRG LVL3 (GOWN DISPOSABLE) IMPLANT
POSITIONER HEAD 8X9X4 ADT (SOFTGOODS) ×1 IMPLANT
SPONGE GAUZE 2X2 8PLY STRL LF (GAUZE/BANDAGES/DRESSINGS) ×1 IMPLANT
SUT MNCRL AB 4-0 PS2 18 (SUTURE) ×1 IMPLANT
SUT VIC AB 3-0 SH 27X BRD (SUTURE) ×1 IMPLANT

## 2024-01-02 NOTE — Interval H&P Note (Signed)
 History and Physical Interval Note:  01/02/2024 10:04 AM  Elizabeth Graves  has presented today for surgery, with the diagnosis of PORT A CATH IN PLACE.  The various methods of treatment have been discussed with the patient and family. After consideration of risks, benefits and other options for treatment, the patient has consented to  Procedure(s): MINOR REMOVAL PORT-A-CATH (N/A) as a surgical intervention.  The patient's history has been reviewed, patient examined, no change in status, stable for surgery.  I have reviewed the patient's chart and labs.  Questions were answered to the patient's satisfaction.     [name]

## 2024-01-02 NOTE — Progress Notes (Signed)
 [facility]  Cxr reviewed. No issues post removal, catheter removed completely.  Algis Greenhouse, MD [facility] [address] [place], [place] [address] [phone] (office)

## 2024-01-02 NOTE — Discharge Instructions (Signed)
 Keep area clean and dry. You can take a shower in 24 hours. Do not submerge in water until healed.  Take tylenol and ibuprofen for pain control and Norco for severe pain.

## 2024-01-02 NOTE — Op Note (Signed)
 [facility] Procedure Note  01/02/24  Pre-procedure Diagnosis: Port a catheter in place    Post-procedure Diagnosis: Same   Procedure(s) Performed: Removal of right subclavian port a catheter    Surgeon: Manuelita BROCKS. Kallie, MD   Assistants: No qualified resident was available    Anesthesia: Lidocaine  1%    Specimens: None    Estimated Blood Loss: Minimal  Wound Class: Clean   Procedure Indications: Elizabeth Graves is a 59 yo with a history of breast cancer s/p treatment. We discussed removal of her port and risk of bleeding, infection, incomplete removal, pneumothorax.   Findings: port completely removed    Procedure: The patient was taken to the procedure room and placed supine. The right chest and neck were prepared and draped in the usual sterile fashion. Lidocaine  1% was injected over the port. Using scissors and blunt dissection, I freed the port from the cavity and removed the sutures. My assistant held pressure on the subclavian entry site as it was removed and held pressure for 10 minutes. Hemostasis of the cavity was achieved. Irrigation was done. The cavity was closed with interrupted 3-0 Vicryl and a 4-0 Monocryl subcuticular and dermabond.   A CXR was ordered to document removal.   Final inspection revealed acceptable hemostasis. The patient tolerated the procedure well.     Manuelita Kallie, MD [name] [name] [place], [place] [address] [phone] (office)

## 2024-01-04 ENCOUNTER — Encounter (HOSPITAL_COMMUNITY): Payer: Self-pay | Admitting: General Surgery

## 2024-02-18 ENCOUNTER — Other Ambulatory Visit: Payer: Self-pay | Admitting: Family Medicine

## 2024-03-13 ENCOUNTER — Other Ambulatory Visit: Payer: Self-pay | Admitting: Family Medicine

## 2024-03-20 ENCOUNTER — Other Ambulatory Visit: Payer: Self-pay | Admitting: Family Medicine

## 2024-04-01 ENCOUNTER — Ambulatory Visit: Payer: BC Managed Care – PPO | Admitting: Family Medicine

## 2024-04-01 ENCOUNTER — Encounter: Payer: Self-pay | Admitting: Family Medicine

## 2024-04-01 VITALS — BP 115/82 | HR 91 | Temp 98.1°F | Ht 61.0 in | Wt 160.0 lb

## 2024-04-01 DIAGNOSIS — E1159 Type 2 diabetes mellitus with other circulatory complications: Secondary | ICD-10-CM

## 2024-04-01 DIAGNOSIS — E785 Hyperlipidemia, unspecified: Secondary | ICD-10-CM

## 2024-04-01 DIAGNOSIS — I10 Essential (primary) hypertension: Secondary | ICD-10-CM

## 2024-04-01 DIAGNOSIS — M79602 Pain in left arm: Secondary | ICD-10-CM

## 2024-04-01 DIAGNOSIS — E119 Type 2 diabetes mellitus without complications: Secondary | ICD-10-CM

## 2024-04-01 DIAGNOSIS — E1169 Type 2 diabetes mellitus with other specified complication: Secondary | ICD-10-CM | POA: Diagnosis not present

## 2024-04-01 MED ORDER — NAPROXEN 500 MG PO TABS: 500.0000 mg | ORAL_TABLET | Freq: Two times a day (BID) | ORAL | 0 refills | Status: AC

## 2024-04-01 MED ORDER — SERTRALINE HCL 50 MG PO TABS: 50.0000 mg | ORAL_TABLET | Freq: Every day | ORAL | 1 refills | Status: DC

## 2024-04-01 MED ORDER — LISINOPRIL 5 MG PO TABS: 5.0000 mg | ORAL_TABLET | Freq: Every day | ORAL | 1 refills | Status: DC

## 2024-04-01 MED ORDER — ATORVASTATIN CALCIUM 20 MG PO TABS: 20.0000 mg | ORAL_TABLET | Freq: Every day | ORAL | 1 refills | Status: DC

## 2024-04-01 MED ORDER — METFORMIN HCL ER 500 MG PO TB24: ORAL_TABLET | ORAL | 1 refills | Status: DC

## 2024-04-01 NOTE — Progress Notes (Signed)
 Subjective:    Patient ID: Elizabeth Graves, female    DOB: 1966/09/06, 58 y.o.   MRN: [id number]  HPI Diabetes and hypertension follow up Discussed the use of AI scribe software for clinical note transcription with the patient, who gave verbal consent to proceed.  History of Present Illness   Elizabeth CREMEANS is a 58 year old female who presents with left arm pain and trigger finger.  She experiences significant pain in her left arm, extending from the shoulder to the elbow, persisting for about a week. The pain worsens with activities involving pushing and pulling at work and does not improve with rest. No numbness or tingling in the arm, but occasional 'pins and needles' sensations in the hands. Over-the-counter medications like Aleve and Tylenol, as well as topical treatments such as Biofreeze and Bengay, have not provided relief.  She has a 'trigger finger' in her left ring finger, requiring manual assistance to 'pop it open' when making a fist. This is accompanied by a sensation of swelling and tightness in her hand when bending her fingers.  She has a history of diabetes, managed with medication and a relatively healthy diet. She is consistent with her medication regimen, including Zoloft, which she pays for out-of-pocket due to insurance coverage issues. She has not resumed smoking and remains active at work, although she experiences shortness of breath with increased physical exertion.  She is active at work and has quit smoking. She does not engage in additional physical activities outside of work due to the physical demands of her job.      Left arm see above   Review of Systems     Objective:   Physical Exam Decent range of motion of the left shoulder Subjective discomfort in the shoulder and humerus region Discomfort from the base of her neck into the arm but not beyond the elbow Lungs are clear hearts regular Axilla no masses are felt Extremities no edema        Assessment & Plan:  1. HTN (hypertension), benign (Primary) HTN- patient seen for follow-up regarding HTN.   Diet, medication compliance, appropriate labs and refills were completed.   Importance of keeping blood pressure under good control to lessen the risk of complications discussed Regular follow-up visits discussed  - Microalbumin/Creatinine Ratio, Urine  2. Type 2 diabetes mellitus without complication, without long-term current use of insulin (HCC) The patient was seen today as part of a comprehensive visit for diabetes. The importance of keeping her A1c at or below 7 range was discussed.  Discussed diet, activity, and medication compliance Emphasized healthy eating primarily with vegetables fruits and if utilizing meats lean meats such as chicken or fish grilled baked broiled Avoid sugary drinks Minimize and avoid processed foods Fit in regular physical activity preferably 25 to 30 minutes 4 times per week Standard follow-up visit recommended.  Patient aware lack of control and follow-up increases risk of diabetic complications. Regular follow-up visits Yearly ophthalmology Yearly foot exam  - Hemoglobin A1c - Microalbumin/Creatinine Ratio, Urine  3. Hyperlipidemia associated with type 2 diabetes mellitus (HCC) Hyperlipidemia-importance of diet, weight control, activity, compliance with medications discussed.   Recent labs reviewed.   Any additional labs or refills ordered.   Importance of keeping under good control discussed. Regular follow-up visits discussed  - Lipid Panel  4. Left arm pain Range of motion exercises anti-inflammatory if not dramatically better within 2 weeks let us know may need physical therapy or possible Ortho  See  further assessment below  Assessment and Plan    Left Arm Pain with Possible Shoulder Inflammation or Cervical Radiculopathy Left arm pain with possible shoulder inflammation or cervical radiculopathy. Differential includes shoulder  inflammation and cervical radiculopathy. No surgical issue evident. - Prescribed naproxen twice daily for 10-14 days. - Provided exercises for shoulder range of motion. - Reassess in 2-3 weeks; consider specialist referral and imaging if no improvement.  Trigger Finger Trigger finger in left ring finger, occasionally requiring manual release. No immediate intervention needed unless symptoms worsen. - Monitor symptoms; intervene if symptoms worsen.  Diabetes Mellitus Diabetes management well-controlled. Discussed importance of annual eye exams for diabetic retinopathy prevention. - Order lab work including A1c and kidney function tests before oncology visit. - Encourage scheduling of annual eye exam and ensure results are sent to family doctor.  General Health Maintenance Due for routine diabetic foot exam and lab work. - Perform diabetic foot exam. - Order lab work including cholesterol and A1c tests.

## 2024-04-02 LAB — MICROALBUMIN / CREATININE URINE RATIO
Creatinine, Urine: 34.8 mg/dL
Microalb/Creat Ratio: <9 mg/g{creat} (ref 0–29)
Microalbumin, Urine: <3 ug/mL

## 2024-04-14 ENCOUNTER — Encounter: Payer: Self-pay | Admitting: Family Medicine

## 2024-04-15 ENCOUNTER — Encounter: Payer: Self-pay | Admitting: Family Medicine

## 2024-04-15 ENCOUNTER — Other Ambulatory Visit: Payer: Self-pay | Admitting: *Deleted

## 2024-04-15 ENCOUNTER — Inpatient Hospital Stay: Payer: BC Managed Care – PPO | Attending: Hematology

## 2024-04-15 DIAGNOSIS — I890 Lymphedema, not elsewhere classified: Secondary | ICD-10-CM | POA: Diagnosis not present

## 2024-04-15 DIAGNOSIS — I10 Essential (primary) hypertension: Secondary | ICD-10-CM

## 2024-04-15 DIAGNOSIS — Z8541 Personal history of malignant neoplasm of cervix uteri: Secondary | ICD-10-CM | POA: Insufficient documentation

## 2024-04-15 DIAGNOSIS — E876 Hypokalemia: Secondary | ICD-10-CM | POA: Insufficient documentation

## 2024-04-15 DIAGNOSIS — Z90721 Acquired absence of ovaries, unilateral: Secondary | ICD-10-CM | POA: Insufficient documentation

## 2024-04-15 DIAGNOSIS — Z801 Family history of malignant neoplasm of trachea, bronchus and lung: Secondary | ICD-10-CM | POA: Diagnosis not present

## 2024-04-15 DIAGNOSIS — Z853 Personal history of malignant neoplasm of breast: Secondary | ICD-10-CM | POA: Diagnosis present

## 2024-04-15 DIAGNOSIS — Z803 Family history of malignant neoplasm of breast: Secondary | ICD-10-CM | POA: Diagnosis not present

## 2024-04-15 DIAGNOSIS — Z90710 Acquired absence of both cervix and uterus: Secondary | ICD-10-CM | POA: Insufficient documentation

## 2024-04-15 DIAGNOSIS — Z87891 Personal history of nicotine dependence: Secondary | ICD-10-CM | POA: Diagnosis not present

## 2024-04-15 DIAGNOSIS — Z9012 Acquired absence of left breast and nipple: Secondary | ICD-10-CM | POA: Diagnosis not present

## 2024-04-15 DIAGNOSIS — Z8379 Family history of other diseases of the digestive system: Secondary | ICD-10-CM | POA: Insufficient documentation

## 2024-04-15 DIAGNOSIS — Z833 Family history of diabetes mellitus: Secondary | ICD-10-CM | POA: Insufficient documentation

## 2024-04-15 DIAGNOSIS — Z808 Family history of malignant neoplasm of other organs or systems: Secondary | ICD-10-CM | POA: Insufficient documentation

## 2024-04-15 DIAGNOSIS — E119 Type 2 diabetes mellitus without complications: Secondary | ICD-10-CM

## 2024-04-15 DIAGNOSIS — Z8249 Family history of ischemic heart disease and other diseases of the circulatory system: Secondary | ICD-10-CM | POA: Diagnosis not present

## 2024-04-15 DIAGNOSIS — C50919 Malignant neoplasm of unspecified site of unspecified female breast: Secondary | ICD-10-CM

## 2024-04-15 LAB — LIPID PANEL
Cholesterol: 127 mg/dL (ref 0–200)
HDL: 32 mg/dL — ABNORMAL LOW
LDL Cholesterol: 52 mg/dL (ref 0–99)
Total CHOL/HDL Ratio: 4 ratio
Triglycerides: 216 mg/dL — ABNORMAL HIGH
VLDL: 43 mg/dL — ABNORMAL HIGH (ref 0–40)

## 2024-04-15 LAB — HEMOGLOBIN A1C
Hgb A1c MFr Bld: 6.9 % — ABNORMAL HIGH (ref 4.8–5.6)
Mean Plasma Glucose: 151.33 mg/dL

## 2024-04-15 LAB — CBC WITH DIFFERENTIAL/PLATELET
Abs Immature Granulocytes: 0.04 10*3/uL (ref 0.00–0.07)
Basophils Absolute: 0 10*3/uL (ref 0.0–0.1)
Basophils Relative: 1 %
Eosinophils Absolute: 0.2 10*3/uL (ref 0.0–0.5)
Eosinophils Relative: 3 %
HCT: 40.2 % (ref 36.0–46.0)
Hemoglobin: 13 g/dL (ref 12.0–15.0)
Immature Granulocytes: 1 %
Lymphocytes Relative: 29 %
Lymphs Abs: 2.1 10*3/uL (ref 0.7–4.0)
MCH: 27.1 pg (ref 26.0–34.0)
MCHC: 32.3 g/dL (ref 30.0–36.0)
MCV: 83.8 fL (ref 80.0–100.0)
Monocytes Absolute: 0.6 10*3/uL (ref 0.1–1.0)
Monocytes Relative: 8 %
Neutro Abs: 4.1 10*3/uL (ref 1.7–7.7)
Neutrophils Relative %: 58 %
Platelets: 286 10*3/uL (ref 150–400)
RBC: 4.8 MIL/uL (ref 3.87–5.11)
RDW: 13.6 % (ref 11.5–15.5)
WBC: 7.1 10*3/uL (ref 4.0–10.5)
nRBC: 0 % (ref 0.0–0.2)

## 2024-04-15 LAB — VITAMIN D 25 HYDROXY (VIT D DEFICIENCY, FRACTURES): Vit D, 25-Hydroxy: 57.68 ng/mL (ref 30–100)

## 2024-04-15 LAB — COMPREHENSIVE METABOLIC PANEL WITH GFR
ALT: 23 U/L (ref 0–44)
AST: 20 U/L (ref 15–41)
Albumin: 4 g/dL (ref 3.5–5.0)
Alkaline Phosphatase: 80 U/L (ref 38–126)
Anion gap: 11 (ref 5–15)
BUN: 19 mg/dL (ref 6–20)
CO2: 23 mmol/L (ref 22–32)
Calcium: 9 mg/dL (ref 8.9–10.3)
Chloride: 98 mmol/L (ref 98–111)
Creatinine, Ser: 0.9 mg/dL (ref 0.44–1.00)
GFR, Estimated: >60 mL/min
Glucose, Bld: 166 mg/dL — ABNORMAL HIGH (ref 70–99)
Potassium: 3.6 mmol/L (ref 3.5–5.1)
Sodium: 132 mmol/L — ABNORMAL LOW (ref 135–145)
Total Bilirubin: 0.4 mg/dL (ref 0.0–1.2)
Total Protein: 7.4 g/dL (ref 6.5–8.1)

## 2024-04-15 LAB — MAGNESIUM: Magnesium: 1.7 mg/dL (ref 1.7–2.4)

## 2024-04-15 NOTE — Telephone Encounter (Signed)
 Nurses I would recommend a consultation with [facility] They can do further evaluation to find out if this is coming from the shoulder or possibly an impingement of the nerve in the neck causing the trouble.  As for medications-Lyrica which is pregabalin can be tried to see if it can help with some of the discomfort while we are waiting for an appointment if she is interested in trying this medication in place of the naproxen  Typically Lyrica is well-tolerated but we always start at a low dose and gradually build up depending on the response  Small number of people can experience dizziness or drowsiness with the medicine certainly if that occurs we would recommend stopping the medicine  Please see what Jerry Morgans would like to do in regards to this thank you-Dr. Geralyn Knee

## 2024-04-16 ENCOUNTER — Other Ambulatory Visit: Payer: Self-pay

## 2024-04-16 ENCOUNTER — Encounter: Payer: Self-pay | Admitting: Family Medicine

## 2024-04-16 ENCOUNTER — Other Ambulatory Visit: Payer: Self-pay | Admitting: Family Medicine

## 2024-04-16 DIAGNOSIS — M542 Cervicalgia: Secondary | ICD-10-CM

## 2024-04-16 LAB — CANCER ANTIGEN 15-3: CA 15-3: 15.2 U/mL (ref 0.0–25.0)

## 2024-04-16 LAB — CANCER ANTIGEN 27.29: CA 27.29: 18 U/mL (ref 0.0–38.6)

## 2024-04-16 MED ORDER — PREGABALIN 25 MG PO CAPS: 25.0000 mg | ORAL_CAPSULE | Freq: Two times a day (BID) | ORAL | 2 refills | Status: DC

## 2024-04-22 ENCOUNTER — Inpatient Hospital Stay (HOSPITAL_BASED_OUTPATIENT_CLINIC_OR_DEPARTMENT_OTHER): Payer: BC Managed Care – PPO | Admitting: Hematology

## 2024-04-22 VITALS — BP 137/73 | HR 99 | Temp 98.1°F | Resp 20 | Wt 158.3 lb

## 2024-04-22 DIAGNOSIS — Z17421 Hormone receptor negative with human epidermal growth factor receptor 2 negative status: Secondary | ICD-10-CM | POA: Diagnosis not present

## 2024-04-22 DIAGNOSIS — Z1231 Encounter for screening mammogram for malignant neoplasm of breast: Secondary | ICD-10-CM

## 2024-04-22 DIAGNOSIS — C50919 Malignant neoplasm of unspecified site of unspecified female breast: Secondary | ICD-10-CM

## 2024-04-22 DIAGNOSIS — Z853 Personal history of malignant neoplasm of breast: Secondary | ICD-10-CM | POA: Diagnosis not present

## 2024-04-22 NOTE — Patient Instructions (Addendum)
 [facility] at [facility] Discharge Instructions   You were seen and examined today by Dr. Cheree Cords.  He reviewed the results of your lab work which are normal/stable.   He reviewed your bone density test from October which is normal.   We will arrange for you to have a mammogram this August.   We will see you back in 6 months. We will repeat lab work prior to this visit.   Return as scheduled.    Thank you for choosing [facility] at [facility] to provide your oncology and hematology care.  To afford each patient quality time with our provider, please arrive at least 15 minutes before your scheduled appointment time.   If you have a lab appointment with the Cancer Center please come in thru the Main Entrance and check in at the main information desk.  You need to re-schedule your appointment should you arrive 10 or more minutes late.  We strive to give you quality time with our providers, and arriving late affects you and other patients whose appointments are after yours.  Also, if you no show three or more times for appointments you may be dismissed from the clinic at the providers discretion.     Again, thank you for choosing [facility].  Our hope is that these requests will decrease the amount of time that you wait before being seen by our physicians.       _____________________________________________________________  Should you have questions after your visit to [facility], please contact our office at [phone] and follow the prompts.  Our office hours are 8:00 a.m. and 4:30 p.m. Monday - Friday.  Please note that voicemails left after 4:00 p.m. may not be returned until the following business day.  We are closed weekends and major holidays.  You do have access to a nurse 24-7, just call the main number to the clinic [phone] and do not press any options, hold on the line and a nurse will answer the  phone.    For prescription refill requests, have your pharmacy contact our office and allow 72 hours.    Due to Covid, you will need to wear a mask upon entering the hospital. If you do not have a mask, a mask will be given to you at the Main Entrance upon arrival. For doctor visits, patients may have 1 support person age 70 or older with them. For treatment visits, patients can not have anyone with them due to social distancing guidelines and our immunocompromised population.

## 2024-04-22 NOTE — Progress Notes (Signed)
 [facility] [address], [place] [address]    Clinic Day:  04/22/2024  Referring physician: Bennet Brasil, MD  Patient Care Team: Elizabeth Brasil, MD as PCP - General (Family Medicine) Elizabeth Knuckles, RN as Oncology Nurse Navigator (Oncology) [facility], Elizabeth G, RN as Oncology Nurse Navigator (Oncology)   ASSESSMENT & PLAN:   Assessment: 1.  Triple negative left breast cancer (T2N1): -Patient identified left breast mass with left nipple retraction about 2 months ago. -Mammogram on 06/13/2020 showed mass in the retroareolar 12 o'clock position in the left breast.  2 suspicious left axillary lymph nodes, largest measuring 2 cm. -Breast ultrasound showed 2.7 x 1.6 x 2.2 cm mass in the 12:00 retroareolar position.  Just lateral to the mass is a calcified hypoechoic nodule that corresponds to densely calcified degenerating fibroadenoma. -Left breast 12:00 biopsy showed invasive ductal carcinoma, grade 2/3.  Left axillary lymph node biopsy was consistent with meta stasis. -ER negative, PR negative and Ki-67 15%.  HER-2 equivocal by IHC.  HER-2 FISH negative. -MRI of the breast on 07/19/2020 shows 4.2 cm enhancing mass in the subareolar left breast, enhancement extending to the level of the left nipple with associated nipple retraction and periareolar skin thickening.  6 morphologically abnormal level 1 left axillary lymph nodes.  No evidence of malignancy in the right breast. -CT CAP on 07/14/2020 shows dominant left axillary nodes measuring 1.8 cm short axis.  Small left subpectoral nodes measuring up to 7 mm short axis, suspicious.  Small mediastinal lymph nodes measuring 7 to 9 mm, thought to be reactive.  No other evidence of metastatic disease. -Neoadjuvant Dose dense AC started on 08/01/2020. -11 cycles of carboplatin  and Taxol  she completed on 12/20/2020. -Left mastectomy and axillary lymph node biopsy on 02/09/2021-pathology with 0.2 cm grade 2 IDC, margins negative,  0/12 lymph nodes involved, [id number]. - 6 cycles of adjuvant capecitabine  from 03/20/2021 through 07/04/2021. [employer] has denied request for pembrolizumab based on keynote study.   2.  Family history: -Father had gastroesophageal junction cancer.  Maternal grandmother had throat cancer and paternal aunt had breast cancer. -[facility] genetics testing showed MSH2 VUS.   3.  High risk drug monitoring: -Echo on 07/12/2020 shows EF 60 to 65%.   Plan: 1.  T2N1 left breast TNBC: - Right breast mammogram (08/25/2023): BI-RADS Category 1. - Left mastectomy site is within normal limits.  Right breast has no palpable masses.  No palpable adenopathy. - She has some aching sensation in the left upper extremity as she is doing more physical work.  This is postsurgical complication with lymphedema and pain.  I have recommended that she not do strenuous work with her arms. - Labs on 04/15/2024 normal LFTs and creatinine.  CBC was normal.  Tumor markers including CA 15-3 and CA 27-29 were normal. - Will schedule her for right breast mammogram in August.  RTC 6 months for follow-up.    2.  Hypokalemia: - Continue potassium OTC tablet daily.  Potassium is normal.   3.  Hypomagnesemia: - Continue OTC magnesium  tablet daily.  Magnesium  is normal.   4.  Bone health: - We reviewed results of DEXA scan from 10/23/2023: T-score -0.7 in the normal range. - Continue calcium  and vitamin D  supplements.  Vitamin D  level is normal at 57.  Orders Placed This Encounter  Procedures   MM 3D SCREENING MAMMOGRAM UNILATERAL RIGHT BREAST    NAS No implants NMD-[place]    Standing Status:  Future    Expected Date:   08/26/2024    Expiration Date:   04/22/2025    Reason for Exam (SYMPTOM  OR DIAGNOSIS REQUIRED):   breast cancer screening    Is the patient pregnant?:   No    Preferred imaging location?:   [facility]   CBC with Differential    Standing Status:   Future    Expected Date:   10/18/2024     Expiration Date:   04/22/2025   Comprehensive metabolic panel    Standing Status:   Future    Expected Date:   10/18/2024    Expiration Date:   04/22/2025   Magnesium     Standing Status:   Future    Expected Date:   10/18/2024    Expiration Date:   04/22/2025   Cancer antigen 27.29    Standing Status:   Future    Expected Date:   10/18/2024    Expiration Date:   04/22/2025   Cancer antigen 15-3    Standing Status:   Future    Expected Date:   10/18/2024    Expiration Date:   04/22/2025      [facility],acting as a scribe for Elizabeth Boros, MD.,have documented all relevant documentation on the behalf of Elizabeth Boros, MD,as directed by  Elizabeth Boros, MD while in the presence of Elizabeth Boros, MD.  I, Elizabeth Boros MD, have reviewed the above documentation for accuracy and completeness, and I agree with the above.     Elizabeth Boros, MD   4/24/20254:35 PM  CHIEF COMPLAINT:   Diagnosis: left breast cancer    Cancer Staging  Triple negative malignant neoplasm of breast [facility]) Staging form: Breast, AJCC 8th Edition - Clinical stage from 06/20/2020: Stage IIIB (cT2, cN1, cM0, G2, ER-, PR-, HER2-) - Signed by Sonja Lamont, MD on 06/05/2021 - Pathologic stage from 02/09/2021: No Stage Recommended (ypT1a, pN0, cM0, G2, ER-, PR-, HER2-) - Signed by Sonja Halbur, MD on 06/05/2021    Prior Therapy: Neoadjuvant chemotherapy, left mastectomy  Current Therapy:  surveillance   HISTORY OF PRESENT ILLNESS:   Oncology History Overview Note  Cancer Staging Triple negative malignant neoplasm of breast (HCC) Staging form: Breast, AJCC 8th Edition - Clinical stage from 06/20/2020: Stage IIIB (cT2, cN1, cM0, G2, ER-, PR-, HER2-) - Signed by Sonja [place], MD on 06/05/2021 Stage prefix: Initial diagnosis Histologic grading system: 3 grade system - Pathologic stage from 02/09/2021: No Stage Recommended (ypT1a, pN0, cM0, G2, ER-, PR-, HER2-) - Signed by Sonja [place], MD on  06/05/2021 Stage prefix: Post-therapy Response to neoadjuvant therapy: Partial response Histologic grading system: 3 grade system    Triple negative malignant neoplasm of breast (HCC)  06/20/2020 Cancer Staging   Staging form: Breast, AJCC 8th Edition - Clinical stage from 06/20/2020: Stage IIIB (cT2, cN1, cM0, G2, ER-, PR-, HER2-) - Signed by Sonja , MD on 06/05/2021 Stage prefix: Initial diagnosis Histologic grading system: 3 grade system   07/05/2020 Initial Diagnosis   Triple negative malignant neoplasm of breast (HCC)   08/01/2020 - 12/20/2020 Chemotherapy      Patient is on Antibody Plan: HEAD/NECK PEMBROLIZUMAB Q21D    08/17/2020 Genetic Testing   Negative genetic testing:  No pathogenic variants detected on the [facility] CustomNext-Cancer + RNAinsight panel. A variant of uncertain significance (VUS) was detected in the MSH2 gene called c.1600C>T (p.R534C). The report date is 08/17/2020.  The CustomNext-Cancer+RNAinsight panel offered by [facility] includes sequencing and rearrangement analysis for up  to 91 genes, which included the following 47 genes for Ms. Liskey:  APC*, ATM*, AXIN2, BARD1, BMPR1A, BRCA1*, BRCA2*, BRIP1*, CDH1*, CDK4, CDKN2A, CHEK2*, DICER1, EPCAM, GREM1, HOXB13, MEN1, MLH1*, MSH2*, MSH3, MSH6*, MUTYH*, NBN, NF1*, NF2, NTHL1, PALB2*, PMS2*, POLD1, POLE, PTEN*, RAD51C*, RAD51D*, RECQL, RET, SDHA, SDHAF2, SDHB, SDHC, SDHD, SMAD4, SMARCA4, STK11, TP53*, TSC1, TSC2, and VHL.  DNA and RNA analyses performed for * genes.     02/09/2021 Cancer Staging   Staging form: Breast, AJCC 8th Edition - Pathologic stage from 02/09/2021: No Stage Recommended (ypT1a, pN0, cM0, G2, ER-, PR-, HER2-) - Signed by Sonja [place], MD on 06/05/2021 Stage prefix: Post-therapy Response to neoadjuvant therapy: Partial response Histologic grading system: 3 grade system   06/18/2021 - 06/18/2021 Chemotherapy   Patient is on Treatment Plan : HEAD/NECK Pembrolizumab Q21D     Drug-induced neutropenia (HCC)      INTERVAL HISTORY:   Latoyna is a 58 y.o. female presenting to clinic today for follow up of left breast cancer. She was last seen by me on 10/16/23.  Since her last visit, she underwent bone density scan on 10/21/23 with a T-score of -0.7, which is considered normal. Brinlee had port removal on 01/02/24 under Dr. Collene Dawson.   Today, she states that she is doing well overall. Her appetite level is at 100%. Her energy level is at 75%. She is taking Magnesium  as prescribed, as well as calcium  and vitamin D  supplements.   Equilla reports worsening lymphedema in the left arm and shoulder with associated stinging and tingling sensations. She does not have a lymphedema sleeve.  PAST MEDICAL HISTORY:   Past Medical History: Past Medical History:  Diagnosis Date   Asthma    Breast cancer (HCC)    Cancer (HCC)    cervical - 20 yrs ago   Carpal tunnel syndrome    Depression    Family history of breast cancer    Family history of lung cancer    Hypertension    Hypokalemia    tx w/ k-Dur   Port-A-Cath in place 07/24/2020   Seasonal allergies    tx with benadryl  prn   SVD (spontaneous vaginal delivery)    x 2    Surgical History: Past Surgical History:  Procedure Laterality Date   ABDOMINAL HYSTERECTOMY     CO2 Laser of Cervix     for cervical cancer 20 yrs ago   COLONOSCOPY WITH PROPOFOL  N/A 11/16/2021   Procedure: COLONOSCOPY WITH PROPOFOL ;  Surgeon: Vinetta Greening, DO;  Location: [facility];  Service: Endoscopy;  Laterality: N/A;  1:30 / ASA II   DILATION AND CURETTAGE OF UTERUS  12/2010   polyp removed   DILATION AND CURETTAGE OF UTERUS     x 3 for MAB   LAPAROSCOPIC ASSISTED VAGINAL HYSTERECTOMY  06/09/2012   Procedure: LAPAROSCOPIC ASSISTED VAGINAL HYSTERECTOMY;  Surgeon: Deann Exon, MD;  Location: [facility];  Service: Gynecology;  Laterality: N/A;   MASTECTOMY MODIFIED RADICAL Left 02/09/2021   Procedure: MASTECTOMY MODIFIED RADICAL;  Surgeon: Awilda Bogus, MD;   Location: [facility];  Service: General;  Laterality: Left;   POLYPECTOMY  11/16/2021   Procedure: POLYPECTOMY;  Surgeon: Vinetta Greening, DO;  Location: [facility];  Service: Endoscopy;;  descending   PORT-A-CATH REMOVAL N/A 01/02/2024   Procedure: MINOR REMOVAL PORT-A-CATH;  Surgeon: Awilda Bogus, MD;  Location: [facility];  Service: General;  Laterality: N/A;   PORTACATH PLACEMENT Right 07/17/2020   Procedure: INSERTION PORT-A-CATH;  Surgeon:  Awilda Bogus, MD;  Location: [facility];  Service: General;  Laterality: Right;   SALPINGOOPHORECTOMY  06/09/2012   Procedure: SALPINGO OOPHERECTOMY;  Surgeon: Deann Exon, MD;  Location: [facility];  Service: Gynecology;  Laterality: Bilateral;   TUBAL LIGATION      Social History: Social History   Socioeconomic History   Marital status: Married    Spouse name: Not on file   Number of children: 2   Years of education: Not on file   Highest education level: GED or equivalent  Occupational History   Occupation: EMPLOYED  Tobacco Use   Smoking status: Former    Current packs/day: 0.00    Average packs/day: 1 pack/day for 25.7 years (25.7 ttl pk-yrs)    Types: Cigarettes    Start date: 05/30/1994    Quit date: 01/31/2020    Years since quitting: 4.2   Smokeless tobacco: Never  Vaping Use   Vaping status: Never Used  Substance and Sexual Activity   Alcohol use: No   Drug use: No   Sexual activity: Yes    Birth control/protection: Surgical  Other Topics Concern   Not on file  Social History Narrative   Not on file   Social Drivers of Health   Financial Resource Strain: Low Risk  (03/28/2024)   Overall Financial Resource Strain (CARDIA)    Difficulty of Paying Living Expenses: Not hard at all  Food Insecurity: No Food Insecurity (03/28/2024)   Hunger Vital Sign    Worried About Running Out of Food in the Last Year: Never true    Ran Out of Food in the Last Year: Never true  Transportation Needs: No Transportation Needs (03/28/2024)    PRAPARE - Administrator, Civil Service (Medical): No    Lack of Transportation (Non-Medical): No  Physical Activity: Unknown (03/28/2024)   Exercise Vital Sign    Days of Exercise per Week: 0 days    Minutes of Exercise per Session: Not on file  Stress: No Stress Concern Present (03/28/2024)   [employer] of Occupational Health - Occupational Stress Questionnaire    Feeling of Stress : Only a little  Social Connections: Moderately Isolated (03/28/2024)   Social Connection and Isolation Panel [NHANES]    Frequency of Communication with Friends and Family: Twice a week    Frequency of Social Gatherings with Friends and Family: Once a week    Attends Religious Services: Never    Database administrator or Organizations: No    Attends Engineer, structural: Not on file    Marital Status: Married  Catering manager Violence: Not At Risk (06/06/2021)   Humiliation, Afraid, Rape, and Kick questionnaire    Fear of Current or Ex-Partner: No    Emotionally Abused: No    Physically Abused: No    Sexually Abused: No    Family History: Family History  Problem Relation Age of Onset   Heart disease Mother    Lung cancer Mother 47       smoker   Heart disease Father    Esophageal cancer Father        GE junction, dx. in his early 60s   Diverticulitis Sister    Diabetes Sister    Diabetes Maternal Grandmother    Heart disease Maternal Grandmother    Throat cancer Maternal Grandmother        dx. late 69s   Healthy Sister    Healthy Daughter    Healthy Son  Breast cancer Paternal Aunt        limited info    Current Medications:  Current Outpatient Medications:    albuterol  (PROAIR  HFA) 108 (90 Base) MCG/ACT inhaler, Inhale 2 puffs into the lungs every 4 (four) hours as needed for wheezing or shortness of breath., Disp: 1 each, Rfl: 1   atorvastatin  (LIPITOR) 20 MG tablet, Take 1 tablet (20 mg total) by mouth daily., Disp: 90 tablet, Rfl: 1   calcium  carbonate  (OSCAL) 1500 (600 Ca) MG TABS tablet, Take 600 mg of elemental calcium  by mouth in the morning., Disp: , Rfl:    cetirizine (ZYRTEC) 10 MG tablet, Take 10 mg by mouth daily., Disp: , Rfl:    Cholecalciferol (VITAMIN D -3) 125 MCG (5000 UT) TABS, Take 5,000 Units by mouth daily., Disp: , Rfl:    diphenhydrAMINE  (BENADRYL ) 25 mg capsule, Take 25 mg by mouth daily as needed for allergies., Disp: , Rfl:    esomeprazole (NEXIUM) 20 MG capsule, Take 20 mg by mouth daily before breakfast., Disp: , Rfl:    HYDROcodone -acetaminophen  (NORCO/VICODIN) 5-325 MG tablet, Take 1 tablet by mouth every 4 (four) hours as needed for severe pain (pain score 7-10)., Disp: 4 tablet, Rfl: 0   ibuprofen  (ADVIL ,MOTRIN ) 200 MG tablet, Take 400-600 mg by mouth as needed for moderate pain (pain.)., Disp: , Rfl:    lisinopril  (ZESTRIL ) 5 MG tablet, Take 1 tablet (5 mg total) by mouth daily., Disp: 90 tablet, Rfl: 1   Magnesium  250 MG TABS, Take 250 mg by mouth daily., Disp: , Rfl:    metFORMIN  (GLUCOPHAGE -XR) 500 MG 24 hr tablet, 2 pills once daily, Disp: 180 tablet, Rfl: 1   naproxen  (NAPROSYN ) 500 MG tablet, Take 1 tablet (500 mg total) by mouth 2 (two) times daily with a meal., Disp: 30 tablet, Rfl: 0   Potassium 99 MG TABS, Take 2 tablets by mouth daily., Disp: , Rfl:    pregabalin  (LYRICA ) 25 MG capsule, Take 1 capsule (25 mg total) by mouth 2 (two) times daily., Disp: 60 capsule, Rfl: 2   sertraline  (ZOLOFT ) 50 MG tablet, Take 1 tablet (50 mg total) by mouth daily., Disp: 90 tablet, Rfl: 1   Allergies: No Known Allergies  REVIEW OF SYSTEMS:   Review of Systems  Constitutional:  Negative for chills, fatigue and fever.  HENT:   Negative for lump/mass, mouth sores, nosebleeds, sore throat and trouble swallowing.   Eyes:  Negative for eye problems.  Respiratory:  Negative for cough and shortness of breath.   Cardiovascular:  Negative for chest pain, leg swelling and palpitations.  Gastrointestinal:  Positive for  diarrhea and nausea. Negative for abdominal pain, constipation and vomiting.  Genitourinary:  Negative for bladder incontinence, difficulty urinating, dysuria, frequency, hematuria and nocturia.   Musculoskeletal:  Negative for arthralgias, back pain, flank pain, myalgias and neck pain.  Skin:  Negative for itching and rash.  Neurological:  Positive for headaches and numbness (stinging, and tingling in left arm and shoulder, 7/10 pain severity). Negative for dizziness.  Hematological:  Does not bruise/bleed easily.  Psychiatric/Behavioral:  Negative for depression, sleep disturbance and suicidal ideas. The patient is not nervous/anxious.   All other systems reviewed and are negative.    VITALS:   Blood pressure 137/73, pulse 99, temperature 98.1 F (36.7 C), temperature source Oral, resp. rate 20, weight 158 lb 4.6 oz (71.8 kg), SpO2 97%.  Wt Readings from Last 3 Encounters:  04/22/24 158 lb 4.6 oz (71.8 kg)  04/01/24 160  lb (72.6 kg)  12/18/23 158 lb (71.7 kg)    Body mass index is 29.91 kg/m.  Performance status (ECOG): 0 - Asymptomatic  PHYSICAL EXAM:   Physical Exam Vitals and nursing note reviewed. Exam conducted with a chaperone present.  Constitutional:      Appearance: Normal appearance.  Cardiovascular:     Rate and Rhythm: Normal rate and regular rhythm.     Pulses: Normal pulses.     Heart sounds: Normal heart sounds.  Pulmonary:     Effort: Pulmonary effort is normal.     Breath sounds: Normal breath sounds.  Chest:     Comments: +left mastectomy site is WNL +port removal site on right chest is well healed +no palpable masses on right breast Abdominal:     Palpations: Abdomen is soft. There is no hepatomegaly, splenomegaly or mass.     Tenderness: There is no abdominal tenderness.  Musculoskeletal:     Right lower leg: No edema.     Left lower leg: No edema.  Lymphadenopathy:     Cervical: No cervical adenopathy.     Right cervical: No superficial, deep or  posterior cervical adenopathy.    Left cervical: No superficial, deep or posterior cervical adenopathy.     Upper Body:     Right upper body: No supraclavicular or axillary adenopathy.     Left upper body: No supraclavicular or axillary adenopathy.  Neurological:     General: No focal deficit present.     Mental Status: She is alert and oriented to person, place, and time.  Psychiatric:        Mood and Affect: Mood normal.        Behavior: Behavior normal.   Breast Exam Chaperone: Elizabeth Gallery, RN   LABS:      Latest Ref Rng & Units 04/15/2024    3:41 PM 10/09/2023    3:14 PM 04/03/2023    1:43 PM  CBC  WBC 4.0 - 10.5 K/uL 7.1  6.8  7.4   Hemoglobin 12.0 - 15.0 Graves/dL 16.1  09.6  04.5   Hematocrit 36.0 - 46.0 % 40.2  36.7  37.6   Platelets 150 - 400 K/uL 286  263  285       Latest Ref Rng & Units 04/15/2024    3:41 PM 10/09/2023    3:14 PM 04/03/2023    1:43 PM  CMP  Glucose 70 - 99 mg/dL 409  811  914   BUN 6 - 20 mg/dL 19  13  13    Creatinine 0.44 - 1.00 mg/dL 7.82  9.56  2.13   Sodium 135 - 145 mmol/L 132  136  134   Potassium 3.5 - 5.1 mmol/L 3.6  3.4  3.3   Chloride 98 - 111 mmol/L 98  103  102   CO2 22 - 32 mmol/L 23  25  23    Calcium  8.9 - 10.3 mg/dL 9.0  8.9  8.8   Total Protein 6.5 - 8.1 Graves/dL 7.4  7.0  7.2   Total Bilirubin 0.0 - 1.2 mg/dL 0.4  0.5  0.5   Alkaline Phos 38 - 126 U/L 80  64  73   AST 15 - 41 U/L 20  24  21    ALT 0 - 44 U/L 23  32  27      No results found for: "CEA1", "CEA" / No results found for: "CEA1", "CEA" No results found for: "PSA1" No results found for: "YQM578" No results  found for: "CAN125"  No results found for: "TOTALPROTELP", "ALBUMINELP", "A1GS", "A2GS", "BETS", "BETA2SER", "GAMS", "MSPIKE", "SPEI" No results found for: "TIBC", "FERRITIN", "IRONPCTSAT" Lab Results  Component Value Date   LDH 127 03/12/2021     STUDIES:   No results found.

## 2024-04-26 ENCOUNTER — Encounter: Payer: Self-pay | Admitting: *Deleted

## 2024-08-04 ENCOUNTER — Other Ambulatory Visit: Payer: Self-pay | Admitting: Family Medicine

## 2024-08-05 ENCOUNTER — Other Ambulatory Visit: Payer: Self-pay | Admitting: Family Medicine

## 2024-08-05 ENCOUNTER — Other Ambulatory Visit: Payer: Self-pay

## 2024-08-05 MED ORDER — PREGABALIN 25 MG PO CAPS: 25.0000 mg | ORAL_CAPSULE | Freq: Two times a day (BID) | ORAL | 2 refills | Status: DC

## 2024-09-06 ENCOUNTER — Ambulatory Visit (HOSPITAL_COMMUNITY)

## 2024-09-10 ENCOUNTER — Ambulatory Visit (HOSPITAL_COMMUNITY)
Admission: RE | Admit: 2024-09-10 | Discharge: 2024-09-10 | Disposition: A | Discharge status: Home / Self Care | Payer: Self-pay | Source: Ambulatory Visit | Attending: Hematology | Admitting: Hematology

## 2024-09-10 DIAGNOSIS — Z1231 Encounter for screening mammogram for malignant neoplasm of breast: Secondary | ICD-10-CM | POA: Insufficient documentation

## 2024-09-30 ENCOUNTER — Other Ambulatory Visit: Payer: BC Managed Care – PPO

## 2024-10-01 ENCOUNTER — Encounter: Payer: Self-pay | Admitting: Family Medicine

## 2024-10-01 ENCOUNTER — Ambulatory Visit: Admitting: Family Medicine

## 2024-10-01 VITALS — BP 107/72 | HR 101 | Temp 98.2°F | Ht 61.0 in | Wt 161.0 lb

## 2024-10-01 DIAGNOSIS — I10 Essential (primary) hypertension: Secondary | ICD-10-CM

## 2024-10-01 DIAGNOSIS — Z7984 Long term (current) use of oral hypoglycemic drugs: Secondary | ICD-10-CM | POA: Diagnosis not present

## 2024-10-01 DIAGNOSIS — E785 Hyperlipidemia, unspecified: Secondary | ICD-10-CM | POA: Diagnosis not present

## 2024-10-01 DIAGNOSIS — E1169 Type 2 diabetes mellitus with other specified complication: Secondary | ICD-10-CM | POA: Diagnosis not present

## 2024-10-01 DIAGNOSIS — E119 Type 2 diabetes mellitus without complications: Secondary | ICD-10-CM

## 2024-10-01 MED ORDER — PREGABALIN 25 MG PO CAPS: 25.0000 mg | ORAL_CAPSULE | Freq: Two times a day (BID) | ORAL | 2 refills | Status: AC

## 2024-10-01 MED ORDER — SERTRALINE HCL 50 MG PO TABS: 50.0000 mg | ORAL_TABLET | Freq: Every day | ORAL | 1 refills | Status: AC

## 2024-10-01 MED ORDER — TIRZEPATIDE 2.5 MG/0.5ML ~~LOC~~ SOAJ: 2.5000 mg | SUBCUTANEOUS | 3 refills | Status: DC

## 2024-10-01 MED ORDER — ATORVASTATIN CALCIUM 20 MG PO TABS: 20.0000 mg | ORAL_TABLET | Freq: Every day | ORAL | 1 refills | Status: DC

## 2024-10-01 MED ORDER — LISINOPRIL 5 MG PO TABS: 5.0000 mg | ORAL_TABLET | Freq: Every day | ORAL | 1 refills | Status: AC

## 2024-10-01 NOTE — Progress Notes (Signed)
   Subjective:    Patient ID: Elizabeth Graves, female    DOB: 1966-10-13, 58 y.o.   MRN: [id number]  HPI 6 month follow up HTN Patient for blood pressure check up.  The patient does have hypertension.   Patient relates dietary measures try to minimize salt The importance of healthy diet and activity were discussed Patient relates compliance  The patient was seen today as part of a comprehensive diabetic check up. Patient has diabetes Patient relates good compliance with taking the medication. We discussed their diet and exercise activities  We also discussed the importance of notifying us  if any excessively high glucoses or low sugars.    Patient here for follow-up regarding cholesterol.    Patient relates taking medication on a regular basis Denies problems with medication Importance of dietary measures discussed Regular lab work regarding lipid and liver was checked and if needing additional labs was appropriately ordered  Patient followed by oncology for previous cancer diagnosis  Review of Systems     Objective:   Physical Exam General-in no acute distress Eyes-no discharge Lungs-respiratory rate normal, CTA CV-no murmurs,RRR Extremities skin warm dry no edema Neuro grossly normal Behavior normal, alert        Assessment & Plan:  1. Hyperlipidemia associated with type 2 diabetes mellitus (HCC) (Primary) Labs ordered healthy diet and continue statin - Lipid Profile  2. HTN (hypertension), benign Continue blood pressure medicine under decent control check urine micro protein - Urine Microalbumin w/creat. ratio  3. Type 2 diabetes mellitus without complication, without long-term current use of insulin (HCC) Continue metformin  check A1c consideration for GLP-1's if out of control - Hemoglobin A1c  Follow-up 6 months

## 2024-10-07 ENCOUNTER — Encounter (HOSPITAL_COMMUNITY): Payer: Self-pay | Admitting: Hematology and Oncology

## 2024-10-07 ENCOUNTER — Ambulatory Visit: Payer: BC Managed Care – PPO | Admitting: Hematology

## 2024-10-10 ENCOUNTER — Encounter (HOSPITAL_COMMUNITY): Payer: Self-pay | Admitting: Hematology and Oncology

## 2024-10-14 ENCOUNTER — Encounter: Payer: Self-pay | Admitting: Family Medicine

## 2024-10-14 ENCOUNTER — Other Ambulatory Visit (HOSPITAL_COMMUNITY)
Admission: RE | Admit: 2024-10-14 | Discharge: 2024-10-14 | Disposition: A | Discharge status: Home / Self Care | Source: Ambulatory Visit | Attending: Family Medicine | Admitting: Family Medicine

## 2024-10-14 ENCOUNTER — Inpatient Hospital Stay: Payer: Self-pay | Attending: Oncology

## 2024-10-14 DIAGNOSIS — E785 Hyperlipidemia, unspecified: Secondary | ICD-10-CM | POA: Insufficient documentation

## 2024-10-14 DIAGNOSIS — Z801 Family history of malignant neoplasm of trachea, bronchus and lung: Secondary | ICD-10-CM | POA: Diagnosis not present

## 2024-10-14 DIAGNOSIS — Z9221 Personal history of antineoplastic chemotherapy: Secondary | ICD-10-CM | POA: Insufficient documentation

## 2024-10-14 DIAGNOSIS — Z8541 Personal history of malignant neoplasm of cervix uteri: Secondary | ICD-10-CM | POA: Insufficient documentation

## 2024-10-14 DIAGNOSIS — Z923 Personal history of irradiation: Secondary | ICD-10-CM | POA: Insufficient documentation

## 2024-10-14 DIAGNOSIS — E119 Type 2 diabetes mellitus without complications: Secondary | ICD-10-CM

## 2024-10-14 DIAGNOSIS — Z853 Personal history of malignant neoplasm of breast: Secondary | ICD-10-CM | POA: Insufficient documentation

## 2024-10-14 DIAGNOSIS — Z90721 Acquired absence of ovaries, unilateral: Secondary | ICD-10-CM | POA: Insufficient documentation

## 2024-10-14 DIAGNOSIS — Z8249 Family history of ischemic heart disease and other diseases of the circulatory system: Secondary | ICD-10-CM | POA: Diagnosis not present

## 2024-10-14 DIAGNOSIS — Z8379 Family history of other diseases of the digestive system: Secondary | ICD-10-CM | POA: Diagnosis not present

## 2024-10-14 DIAGNOSIS — I10 Essential (primary) hypertension: Secondary | ICD-10-CM | POA: Insufficient documentation

## 2024-10-14 DIAGNOSIS — Z803 Family history of malignant neoplasm of breast: Secondary | ICD-10-CM | POA: Insufficient documentation

## 2024-10-14 DIAGNOSIS — Z9012 Acquired absence of left breast and nipple: Secondary | ICD-10-CM | POA: Diagnosis not present

## 2024-10-14 DIAGNOSIS — E1169 Type 2 diabetes mellitus with other specified complication: Secondary | ICD-10-CM | POA: Insufficient documentation

## 2024-10-14 DIAGNOSIS — Z87891 Personal history of nicotine dependence: Secondary | ICD-10-CM | POA: Diagnosis not present

## 2024-10-14 DIAGNOSIS — Z90710 Acquired absence of both cervix and uterus: Secondary | ICD-10-CM | POA: Diagnosis not present

## 2024-10-14 DIAGNOSIS — Z808 Family history of malignant neoplasm of other organs or systems: Secondary | ICD-10-CM | POA: Insufficient documentation

## 2024-10-14 DIAGNOSIS — Z833 Family history of diabetes mellitus: Secondary | ICD-10-CM | POA: Insufficient documentation

## 2024-10-14 DIAGNOSIS — C50919 Malignant neoplasm of unspecified site of unspecified female breast: Secondary | ICD-10-CM

## 2024-10-14 LAB — LIPID PANEL
Cholesterol: 146 mg/dL (ref 0–200)
HDL: 31 mg/dL — ABNORMAL LOW (ref >40)
LDL Cholesterol: 81 mg/dL (ref 0–99)
Total CHOL/HDL Ratio: 4.8 ratio
Triglycerides: 174 mg/dL — ABNORMAL HIGH (ref <150)
VLDL: 35 mg/dL (ref 0–40)

## 2024-10-14 LAB — COMPREHENSIVE METABOLIC PANEL WITH GFR
ALT: 16 U/L (ref 0–44)
AST: 19 U/L (ref 15–41)
Albumin: 4.4 g/dL (ref 3.5–5.0)
Alkaline Phosphatase: 111 U/L (ref 38–126)
Anion gap: 12 (ref 5–15)
BUN: 10 mg/dL (ref 6–20)
CO2: 24 mmol/L (ref 22–32)
Calcium: 9.8 mg/dL (ref 8.9–10.3)
Chloride: 101 mmol/L (ref 98–111)
Creatinine, Ser: 0.97 mg/dL (ref 0.44–1.00)
GFR, Estimated: >60 mL/min (ref >60)
Glucose, Bld: 123 mg/dL — ABNORMAL HIGH (ref 70–99)
Potassium: 3.9 mmol/L (ref 3.5–5.1)
Sodium: 137 mmol/L (ref 135–145)
Total Bilirubin: 0.3 mg/dL (ref 0.0–1.2)
Total Protein: 7.6 g/dL (ref 6.5–8.1)

## 2024-10-14 LAB — CBC WITH DIFFERENTIAL/PLATELET
Abs Immature Granulocytes: 0.02 K/uL (ref 0.00–0.07)
Basophils Absolute: 0.1 K/uL (ref 0.0–0.1)
Basophils Relative: 1 %
Eosinophils Absolute: 0.2 K/uL (ref 0.0–0.5)
Eosinophils Relative: 3 %
HCT: 39.4 % (ref 36.0–46.0)
Hemoglobin: 13.1 g/dL (ref 12.0–15.0)
Immature Granulocytes: 0 %
Lymphocytes Relative: 35 %
Lymphs Abs: 2.3 K/uL (ref 0.7–4.0)
MCH: 26.8 pg (ref 26.0–34.0)
MCHC: 33.2 g/dL (ref 30.0–36.0)
MCV: 80.7 fL (ref 80.0–100.0)
Monocytes Absolute: 0.5 K/uL (ref 0.1–1.0)
Monocytes Relative: 8 %
Neutro Abs: 3.5 K/uL (ref 1.7–7.7)
Neutrophils Relative %: 53 %
Platelets: 357 K/uL (ref 150–400)
RBC: 4.88 MIL/uL (ref 3.87–5.11)
RDW: 13.6 % (ref 11.5–15.5)
WBC: 6.5 K/uL (ref 4.0–10.5)
nRBC: 0 % (ref 0.0–0.2)

## 2024-10-14 LAB — HEMOGLOBIN A1C
Hgb A1c MFr Bld: 7.5 % — ABNORMAL HIGH (ref 4.8–5.6)
Mean Plasma Glucose: 168.55 mg/dL

## 2024-10-14 LAB — MAGNESIUM: Magnesium: 2.2 mg/dL (ref 1.7–2.4)

## 2024-10-15 LAB — MICROALBUMIN / CREATININE URINE RATIO
Creatinine, Urine: 74.1 mg/dL
Microalb Creat Ratio: 4 mg/g{creat} (ref 0–29)
Microalb, Ur: 3.3 ug/mL — ABNORMAL HIGH

## 2024-10-15 LAB — CANCER ANTIGEN 15-3: CA 15-3: 17 U/mL (ref 0.0–25.0)

## 2024-10-15 LAB — CANCER ANTIGEN 27.29: CA 27.29: 24 U/mL (ref 0.0–38.6)

## 2024-10-15 MED ORDER — TIRZEPATIDE 5 MG/0.5ML ~~LOC~~ SOAJ: 5.0000 mg | SUBCUTANEOUS | 3 refills | Status: DC

## 2024-10-15 NOTE — Telephone Encounter (Signed)
 Nurses-please order A1c and metabolic 7 patient to do this lab work in approximately 3 months  Send her message letting her know that we did increase her Mounjaro new dose 5 mg once per week she can send us  an update in approximately 4 to 8 weeks thank you  She has a follow-up visit in the spring she should keep

## 2024-10-17 ENCOUNTER — Other Ambulatory Visit: Payer: Self-pay | Admitting: Family Medicine

## 2024-10-17 ENCOUNTER — Ambulatory Visit: Payer: Self-pay | Admitting: Family Medicine

## 2024-10-17 DIAGNOSIS — E1169 Type 2 diabetes mellitus with other specified complication: Secondary | ICD-10-CM

## 2024-10-17 DIAGNOSIS — E119 Type 2 diabetes mellitus without complications: Secondary | ICD-10-CM

## 2024-10-17 MED ORDER — ATORVASTATIN CALCIUM 40 MG PO TABS: 40.0000 mg | ORAL_TABLET | Freq: Every day | ORAL | 1 refills | Status: AC

## 2024-10-21 ENCOUNTER — Inpatient Hospital Stay: Payer: Self-pay | Admitting: Oncology

## 2024-10-21 VITALS — BP 125/88 | HR 92 | Temp 97.5°F | Resp 18 | Ht <= 58 in | Wt 160.4 lb

## 2024-10-21 DIAGNOSIS — Z17421 Hormone receptor negative with human epidermal growth factor receptor 2 negative status: Secondary | ICD-10-CM | POA: Diagnosis not present

## 2024-10-21 DIAGNOSIS — C50919 Malignant neoplasm of unspecified site of unspecified female breast: Secondary | ICD-10-CM | POA: Diagnosis not present

## 2024-10-21 DIAGNOSIS — E876 Hypokalemia: Secondary | ICD-10-CM | POA: Diagnosis not present

## 2024-10-21 DIAGNOSIS — Z853 Personal history of malignant neoplasm of breast: Secondary | ICD-10-CM | POA: Diagnosis not present

## 2024-10-21 NOTE — Progress Notes (Signed)
 Patient Care Team: Alphonsa Glendia LABOR, MD as PCP - General (Family Medicine) Celestia Joesph SQUIBB, RN as Oncology Nurse Navigator (Oncology) Wilson, Diane G, RN as Oncology Nurse Navigator (Oncology)  Clinic Day:  10/21/2024  Referring physician: Alphonsa Glendia LABOR, MD   CHIEF COMPLAINT:  CC: T2N1 left breast TNBC   Elizabeth Graves 58 y.o. female was transferred to my care after her prior physician has left.   ASSESSMENT & PLAN:   Assessment & Plan: Elizabeth Graves  is a 58 y.o. female with early-stage triple negative breast cancer  Early-stage triple negative breast cancer Stage IIb triple negative left breast carcinoma.  S/p neoadjuvant chemotherapy and resection with residual carcinoma s/p 6 cycles of capecitabine  Extensive oncology history below Patient has been on surveillance  - Labs reviewed today: CMP: WNL CBC: WNL, CA 15-3, CA 27-29: Normal - Mammogram: BI-RADS Category 1 - Will repeat mammogram every year - Continue vitamin D  and calcium  supplementation - Will continue surveillance with breast exam every 6 months  Return to clinic in 6 months with labs  Hypokalemia and hypomagnesemia Patient previously had hypokalemia and hypomagnesemia and has been taking potassium and magnesium  supplementation.  Likely secondary to previous chemotherapy and diarrhea These levels within normal limits today  -Can discontinue potassium and magnesium  supplementation - Will continue to monitor levels.    The patient understands the plans discussed today and is in agreement with them.  She knows to contact our office if she develops concerns prior to her next appointment.  30 minutes of total time was spent for this patient encounter, including preparation,review of records,  face-to-face counseling with the patient and coordination of care, physical exam, and documentation of the encounter.    LILLETTE Verneta SAUNDERS Teague,acting as a Neurosurgeon for Mickiel Dry, MD.,have documented all relevant  documentation on the behalf of Mickiel Dry, MD,as directed by  Mickiel Dry, MD while in the presence of Mickiel Dry, MD.  I, Mickiel Dry MD, have reviewed the above documentation for accuracy and completeness, and I agree with the above.    Verneta SAUNDERS Ege  [facility] - A DEPT OF [facility] [address] [place] [address] Dept: [phone] Dept Fax: [phone]   No orders of the defined types were placed in this encounter.    ONCOLOGY HISTORY:   I have reviewed her chart and materials related to her cancer extensively and collaborated history with the patient. Summary of oncologic history is as follows:   Diagnosis: T2N1 left breast TNBC   -Presentation: Left breast mass with nipple retraction -06/13/2020: Diagnostic Bilateral Mammogram and US : Highly suspicious palpable left breast mass 12 o'clock retroareolar region measures 2.7 cm greatest diameter on ultrasound. Two left axillary lymph nodes are suspicious for metastatic involvement. No evidence of malignancy in the right breast. -06/20/2020: Left breast and left axillary lymph node biopsy.  Pathology: Grade 2 Invasive ductal carcinoma, 1.3 cm. Lymphovascular space involvement. Lymph node positive for ductal carcinoma consistent with metastasis. Tumor cells are Her2 equivocal (2+) on reflex and negative by FISH, ER negative, and PR negative. Ki-67 15%.  -07/14/2020: CT CAP: 3.5 cm central left breast mass, corresponding to the patient's known left breast neoplasm. Left axillary and left subpectoral nodal metastases, as above. Additional small thoracic nodes are technically within normal limits and favored to be reactive, but warrant attention on follow-up. No evidence of metastatic disease in the abdomen/pelvis. -07/19/2020: MRI bilateral breast: 4.2  cm enhancing mass in the subareolar left breast consistent with the patient's biopsy-proven site  of malignancy. Enhancement extends to the level of the left nipple with associated nipple retraction and periareolar skin thickening, suggesting involvement. Six morphologically abnormal level I left axillary lymph nodes. No MRI evidence of malignancy on the right. -07/31/2020: Genetic Mutation testing: Negative -08/01/2020-12/20/2020: 11 cycles of Neoadjuvant dose dense carboplatin  and taxol  completed -02/09/2021: Left mastectomy and axillary lymph node biopsy.  Pathology: Invasive ductal carcinoma, grade 2, spanning 0.2 cm. Resection margins are negative for carcinoma. Twelve of twelve lymph nodes negative for carcinoma (0/12). Staging: ypT1a, ypN0 -03/20/2021-07/04/2021: 6 cycles of adjuvant capecitabine  -Insurance denied pembrolizumab based on keynote study -07/2021-current: Imaging shows NED   Current Treatment:  Surveillance  INTERVAL HISTORY:   Discussed the use of AI scribe software for clinical note transcription with the patient, who gave verbal consent to proceed.  History of Present Illness A 58 year old female with a history of breast cancer presents for routine follow-up and to establish care with me.  She finds her recent blood work and cancer markers reassuring as they are negative. Her mammogram was also negative. She has been undergoing yearly mammograms and follow-up visits every six months.  She has a history of low potassium and was taking potassium pills. She confirms that she is still taking potassium, magnesium , and calcium  pills. She mentions taking vitamin D  with her calcium  pills.  She notes some skin discoloration in the area affected by radiation therapy, which has persisted since her treatment. She attributes this to radiation changes and possibly friction from clothing.  She has no other complaints today.    I have reviewed the past medical history, past surgical history, social history and family history with the patient and they are unchanged from previous  note.  ALLERGIES:  is allergic to metformin  and related.  MEDICATIONS:  Current Outpatient Medications  Medication Sig Dispense Refill   albuterol  (PROAIR  HFA) 108 (90 Base) MCG/ACT inhaler Inhale 2 puffs into the lungs every 4 (four) hours as needed for wheezing or shortness of breath. 1 each 1   atorvastatin  (LIPITOR) 40 MG tablet Take 1 tablet (40 mg total) by mouth daily. 90 tablet 1   calcium  carbonate (OSCAL) 1500 (600 Ca) MG TABS tablet Take 600 mg of elemental calcium  by mouth in the morning.     cetirizine (ZYRTEC) 10 MG tablet Take 10 mg by mouth daily.     Cholecalciferol (VITAMIN D -3) 125 MCG (5000 UT) TABS Take 5,000 Units by mouth daily.     diphenhydrAMINE  (BENADRYL ) 25 mg capsule Take 25 mg by mouth daily as needed for allergies.     esomeprazole (NEXIUM) 20 MG capsule Take 20 mg by mouth daily before breakfast.     ibuprofen  (ADVIL ,MOTRIN ) 200 MG tablet Take 400-600 mg by mouth as needed for moderate pain (pain.).     lisinopril  (ZESTRIL ) 5 MG tablet Take 1 tablet (5 mg total) by mouth daily. 90 tablet 1   Magnesium  250 MG TABS Take 250 mg by mouth daily.     naproxen  (NAPROSYN ) 500 MG tablet Take 1 tablet (500 mg total) by mouth 2 (two) times daily with a meal. 30 tablet 0   Potassium 99 MG TABS Take 2 tablets by mouth daily.     pregabalin  (LYRICA ) 25 MG capsule Take 1 capsule (25 mg total) by mouth 2 (two) times daily. 60 capsule 2   sertraline  (ZOLOFT ) 50 MG tablet Take 1 tablet (50 mg  total) by mouth daily. 90 tablet 1   tirzepatide (MOUNJARO) 5 MG/0.5ML Pen Inject 5 mg into the skin once a week. 2 mL 3   No current facility-administered medications for this visit.    REVIEW OF SYSTEMS:   Review of Systems: Negative except as noted in the HPI.    VITALS:  There were no vitals taken for this visit.  Wt Readings from Last 3 Encounters:  10/01/24 161 lb (73 kg)  04/22/24 158 lb 4.6 oz (71.8 kg)  04/01/24 160 lb (72.6 kg)    There is no height or weight on file  to calculate BMI.  Performance status (ECOG): 0 - Asymptomatic  PHYSICAL EXAM:   GENERAL:alert, no distress and comfortable SKIN: skin color, texture, turgor are normal, no rashes or significant lesions BREAST: Right breast: No lumps palpated, exam normal.  Left breast: S/p mastectomy, no lumps palpated, erythematous chronic rash present.  Nontender.  No axillary lymphadenopathy palpated. LYMPH:  no palpable lymphadenopathy in the cervical, axillary or inguinal LUNGS: clear to auscultation and percussion with normal breathing effort HEART: regular rate & rhythm and no murmurs and no lower extremity edema ABDOMEN:abdomen soft, non-tender and normal bowel sounds Musculoskeletal:no cyanosis of digits and no clubbing  NEURO: alert & oriented x 3 with fluent speech  LABORATORY DATA:  I have reviewed the data as listed   Lab Results  Component Value Date   WBC 6.5 10/14/2024   NEUTROABS 3.5 10/14/2024   HGB 13.1 10/14/2024   HCT 39.4 10/14/2024   MCV 80.7 10/14/2024   PLT 357 10/14/2024      Chemistry      Component Value Date/Time   NA 137 10/14/2024 1446   NA 138 03/06/2020 0807   K 3.9 10/14/2024 1446   CL 101 10/14/2024 1446   CO2 24 10/14/2024 1446   BUN 10 10/14/2024 1446   BUN 16 03/06/2020 0807   CREATININE 0.97 10/14/2024 1446   CREATININE 0.96 05/06/2014 0805      Component Value Date/Time   CALCIUM  9.8 10/14/2024 1446   ALKPHOS 111 10/14/2024 1446   AST 19 10/14/2024 1446   ALT 16 10/14/2024 1446   BILITOT 0.3 10/14/2024 1446   BILITOT 0.2 03/06/2020 0807       Latest Reference Range & Units 10/14/24 14:46  CA 15-3 0.0 - 25.0 U/mL 17.0  CA 27.29 0.0 - 38.6 U/mL 24.0   RADIOGRAPHIC STUDIES: I have personally reviewed the radiological images as listed and agreed with the findings in the report.  MM 3D SCREENING MAMMOGRAM UNILATERAL RIGHT BREAST CLINICAL DATA:  Screening.  EXAM: DIGITAL SCREENING UNILATERAL RIGHT MAMMOGRAM WITH CAD  AND TOMOSYNTHESIS  TECHNIQUE: Right screening digital craniocaudal and mediolateral oblique mammograms were obtained. Right screening digital breast tomosynthesis was performed. The images were evaluated with computer-aided detection.  COMPARISON:  Previous exam(s).  ACR Breast Density Category b: There are scattered areas of fibroglandular density.  FINDINGS: The patient has had a left mastectomy. There are no findings suspicious for malignancy.  IMPRESSION: No mammographic evidence of malignancy. A result letter of this screening mammogram will be mailed directly to the patient.  RECOMMENDATION: Screening mammogram in one year.  (Code:SM-R-37M)  BI-RADS CATEGORY  1: Negative.  Electronically Signed   By: Curtistine Noble   On: 09/14/2024 14:20

## 2024-10-27 NOTE — Telephone Encounter (Signed)
 Orders placed.

## 2025-01-30 ENCOUNTER — Other Ambulatory Visit: Payer: Self-pay | Admitting: Family Medicine

## 2025-01-31 ENCOUNTER — Other Ambulatory Visit: Payer: Self-pay | Admitting: Family Medicine

## 2025-01-31 MED ORDER — TIRZEPATIDE 5 MG/0.5ML ~~LOC~~ SOAJ: 5.0000 mg | SUBCUTANEOUS | 3 refills | Status: AC

## 2025-04-01 ENCOUNTER — Ambulatory Visit: Admitting: Family Medicine

## 2025-04-06 ENCOUNTER — Ambulatory Visit: Admitting: Family Medicine

## 2025-04-14 ENCOUNTER — Inpatient Hospital Stay

## 2025-04-21 ENCOUNTER — Inpatient Hospital Stay: Admitting: Oncology
